# Patient Record
Sex: Male | Born: 1945 | Race: White | Hispanic: No | Marital: Married | Smoking: Never smoker
Health system: Southern US, Community
[De-identification: ages and names within clinical notes are randomized; demographics above are authoritative.]

## PROBLEM LIST (undated history)

## (undated) DIAGNOSIS — I10 Essential (primary) hypertension: Secondary | ICD-10-CM

## (undated) DIAGNOSIS — K219 Gastro-esophageal reflux disease without esophagitis: Secondary | ICD-10-CM

## (undated) DIAGNOSIS — T8859XA Other complications of anesthesia, initial encounter: Secondary | ICD-10-CM

## (undated) DIAGNOSIS — I4891 Unspecified atrial fibrillation: Secondary | ICD-10-CM

## (undated) DIAGNOSIS — I4819 Other persistent atrial fibrillation: Secondary | ICD-10-CM

## (undated) DIAGNOSIS — E785 Hyperlipidemia, unspecified: Secondary | ICD-10-CM

## (undated) DIAGNOSIS — I2510 Atherosclerotic heart disease of native coronary artery without angina pectoris: Secondary | ICD-10-CM

## (undated) DIAGNOSIS — Q278 Other specified congenital malformations of peripheral vascular system: Secondary | ICD-10-CM

## (undated) DIAGNOSIS — I7789 Other specified disorders of arteries and arterioles: Secondary | ICD-10-CM

## (undated) DIAGNOSIS — I219 Acute myocardial infarction, unspecified: Secondary | ICD-10-CM

## (undated) DIAGNOSIS — Q210 Ventricular septal defect: Secondary | ICD-10-CM

## (undated) DIAGNOSIS — T4145XA Adverse effect of unspecified anesthetic, initial encounter: Secondary | ICD-10-CM

## (undated) DIAGNOSIS — I483 Typical atrial flutter: Secondary | ICD-10-CM

## (undated) DIAGNOSIS — G4730 Sleep apnea, unspecified: Secondary | ICD-10-CM

## (undated) DIAGNOSIS — C439 Malignant melanoma of skin, unspecified: Secondary | ICD-10-CM

## (undated) DIAGNOSIS — Q399 Congenital malformation of esophagus, unspecified: Secondary | ICD-10-CM

## (undated) DIAGNOSIS — R7303 Prediabetes: Secondary | ICD-10-CM

## (undated) DIAGNOSIS — D649 Anemia, unspecified: Secondary | ICD-10-CM

## (undated) HISTORY — PX: OTHER SURGICAL HISTORY: SHX169

## (undated) HISTORY — DX: Atherosclerotic heart disease of native coronary artery without angina pectoris: I25.10

## (undated) HISTORY — DX: Ventricular septal defect: Q21.0

## (undated) HISTORY — PX: CARDIAC CATHETERIZATION: SHX172

## (undated) HISTORY — DX: Other persistent atrial fibrillation: I48.19

## (undated) HISTORY — DX: Other specified disorders of arteries and arterioles: I77.89

## (undated) HISTORY — DX: Typical atrial flutter: I48.3

## (undated) HISTORY — PX: SHOULDER SURGERY: SHX246

## (undated) HISTORY — DX: Malignant melanoma of skin, unspecified: C43.9

---
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
## 2012-12-28 DIAGNOSIS — L919 Hypertrophic disorder of the skin, unspecified: Secondary | ICD-10-CM | POA: Diagnosis not present

## 2012-12-28 DIAGNOSIS — L570 Actinic keratosis: Secondary | ICD-10-CM | POA: Diagnosis not present

## 2012-12-28 DIAGNOSIS — D239 Other benign neoplasm of skin, unspecified: Secondary | ICD-10-CM | POA: Diagnosis not present

## 2012-12-28 DIAGNOSIS — L280 Lichen simplex chronicus: Secondary | ICD-10-CM | POA: Diagnosis not present

## 2012-12-28 DIAGNOSIS — Z85820 Personal history of malignant melanoma of skin: Secondary | ICD-10-CM | POA: Diagnosis not present

## 2013-01-05 DIAGNOSIS — L719 Rosacea, unspecified: Secondary | ICD-10-CM | POA: Diagnosis not present

## 2013-01-05 DIAGNOSIS — H40019 Open angle with borderline findings, low risk, unspecified eye: Secondary | ICD-10-CM | POA: Diagnosis not present

## 2013-01-12 DIAGNOSIS — I10 Essential (primary) hypertension: Secondary | ICD-10-CM | POA: Diagnosis not present

## 2013-01-12 DIAGNOSIS — M79609 Pain in unspecified limb: Secondary | ICD-10-CM | POA: Diagnosis not present

## 2013-01-12 DIAGNOSIS — G4733 Obstructive sleep apnea (adult) (pediatric): Secondary | ICD-10-CM | POA: Diagnosis not present

## 2013-01-12 DIAGNOSIS — I2510 Atherosclerotic heart disease of native coronary artery without angina pectoris: Secondary | ICD-10-CM | POA: Diagnosis not present

## 2013-01-26 DIAGNOSIS — Z23 Encounter for immunization: Secondary | ICD-10-CM | POA: Diagnosis not present

## 2013-02-16 DIAGNOSIS — H01009 Unspecified blepharitis unspecified eye, unspecified eyelid: Secondary | ICD-10-CM | POA: Diagnosis not present

## 2013-02-16 DIAGNOSIS — H1045 Other chronic allergic conjunctivitis: Secondary | ICD-10-CM | POA: Diagnosis not present

## 2013-02-16 DIAGNOSIS — H40019 Open angle with borderline findings, low risk, unspecified eye: Secondary | ICD-10-CM | POA: Diagnosis not present

## 2013-03-12 DIAGNOSIS — M19019 Primary osteoarthritis, unspecified shoulder: Secondary | ICD-10-CM | POA: Diagnosis not present

## 2013-03-14 DIAGNOSIS — F988 Other specified behavioral and emotional disorders with onset usually occurring in childhood and adolescence: Secondary | ICD-10-CM | POA: Diagnosis not present

## 2013-04-05 DIAGNOSIS — M719 Bursopathy, unspecified: Secondary | ICD-10-CM | POA: Diagnosis not present

## 2013-04-05 DIAGNOSIS — M67919 Unspecified disorder of synovium and tendon, unspecified shoulder: Secondary | ICD-10-CM | POA: Diagnosis not present

## 2013-04-05 DIAGNOSIS — M19019 Primary osteoarthritis, unspecified shoulder: Secondary | ICD-10-CM | POA: Diagnosis not present

## 2013-04-05 DIAGNOSIS — M24119 Other articular cartilage disorders, unspecified shoulder: Secondary | ICD-10-CM | POA: Diagnosis not present

## 2013-04-13 DIAGNOSIS — M19019 Primary osteoarthritis, unspecified shoulder: Secondary | ICD-10-CM | POA: Diagnosis not present

## 2013-04-19 DIAGNOSIS — M19019 Primary osteoarthritis, unspecified shoulder: Secondary | ICD-10-CM | POA: Diagnosis not present

## 2013-04-21 DIAGNOSIS — M25519 Pain in unspecified shoulder: Secondary | ICD-10-CM | POA: Diagnosis not present

## 2013-04-26 DIAGNOSIS — M25519 Pain in unspecified shoulder: Secondary | ICD-10-CM | POA: Diagnosis not present

## 2013-04-28 DIAGNOSIS — M25519 Pain in unspecified shoulder: Secondary | ICD-10-CM | POA: Diagnosis not present

## 2013-05-03 DIAGNOSIS — M25519 Pain in unspecified shoulder: Secondary | ICD-10-CM | POA: Diagnosis not present

## 2013-05-05 DIAGNOSIS — M25519 Pain in unspecified shoulder: Secondary | ICD-10-CM | POA: Diagnosis not present

## 2013-05-10 DIAGNOSIS — M25519 Pain in unspecified shoulder: Secondary | ICD-10-CM | POA: Diagnosis not present

## 2013-05-12 DIAGNOSIS — M25519 Pain in unspecified shoulder: Secondary | ICD-10-CM | POA: Diagnosis not present

## 2013-05-17 DIAGNOSIS — M25519 Pain in unspecified shoulder: Secondary | ICD-10-CM | POA: Diagnosis not present

## 2013-05-18 DIAGNOSIS — H40019 Open angle with borderline findings, low risk, unspecified eye: Secondary | ICD-10-CM | POA: Diagnosis not present

## 2013-05-18 DIAGNOSIS — H04209 Unspecified epiphora, unspecified lacrimal gland: Secondary | ICD-10-CM | POA: Diagnosis not present

## 2013-05-18 DIAGNOSIS — H04129 Dry eye syndrome of unspecified lacrimal gland: Secondary | ICD-10-CM | POA: Diagnosis not present

## 2013-05-18 DIAGNOSIS — H2510 Age-related nuclear cataract, unspecified eye: Secondary | ICD-10-CM | POA: Diagnosis not present

## 2013-05-18 DIAGNOSIS — L719 Rosacea, unspecified: Secondary | ICD-10-CM | POA: Diagnosis not present

## 2013-05-18 DIAGNOSIS — H35019 Changes in retinal vascular appearance, unspecified eye: Secondary | ICD-10-CM | POA: Diagnosis not present

## 2013-05-19 DIAGNOSIS — M19019 Primary osteoarthritis, unspecified shoulder: Secondary | ICD-10-CM | POA: Diagnosis not present

## 2013-05-19 DIAGNOSIS — M25519 Pain in unspecified shoulder: Secondary | ICD-10-CM | POA: Diagnosis not present

## 2013-05-25 DIAGNOSIS — M19019 Primary osteoarthritis, unspecified shoulder: Secondary | ICD-10-CM | POA: Diagnosis not present

## 2013-05-25 DIAGNOSIS — M25519 Pain in unspecified shoulder: Secondary | ICD-10-CM | POA: Diagnosis not present

## 2013-06-01 DIAGNOSIS — M19019 Primary osteoarthritis, unspecified shoulder: Secondary | ICD-10-CM | POA: Diagnosis not present

## 2013-06-01 DIAGNOSIS — M25519 Pain in unspecified shoulder: Secondary | ICD-10-CM | POA: Diagnosis not present

## 2013-06-14 DIAGNOSIS — F988 Other specified behavioral and emotional disorders with onset usually occurring in childhood and adolescence: Secondary | ICD-10-CM | POA: Diagnosis not present

## 2013-06-14 DIAGNOSIS — M19019 Primary osteoarthritis, unspecified shoulder: Secondary | ICD-10-CM | POA: Diagnosis not present

## 2013-06-21 DIAGNOSIS — Z0000 Encounter for general adult medical examination without abnormal findings: Secondary | ICD-10-CM | POA: Diagnosis not present

## 2013-06-21 DIAGNOSIS — G4733 Obstructive sleep apnea (adult) (pediatric): Secondary | ICD-10-CM | POA: Diagnosis not present

## 2013-06-21 DIAGNOSIS — N529 Male erectile dysfunction, unspecified: Secondary | ICD-10-CM | POA: Diagnosis not present

## 2013-06-21 DIAGNOSIS — I10 Essential (primary) hypertension: Secondary | ICD-10-CM | POA: Diagnosis not present

## 2013-06-21 DIAGNOSIS — F988 Other specified behavioral and emotional disorders with onset usually occurring in childhood and adolescence: Secondary | ICD-10-CM | POA: Diagnosis not present

## 2013-06-21 DIAGNOSIS — Z86010 Personal history of colonic polyps: Secondary | ICD-10-CM | POA: Diagnosis not present

## 2013-06-21 DIAGNOSIS — I2510 Atherosclerotic heart disease of native coronary artery without angina pectoris: Secondary | ICD-10-CM | POA: Diagnosis not present

## 2013-07-12 DIAGNOSIS — F988 Other specified behavioral and emotional disorders with onset usually occurring in childhood and adolescence: Secondary | ICD-10-CM | POA: Diagnosis not present

## 2013-08-11 DIAGNOSIS — Z1211 Encounter for screening for malignant neoplasm of colon: Secondary | ICD-10-CM | POA: Diagnosis not present

## 2013-09-19 DIAGNOSIS — H40019 Open angle with borderline findings, low risk, unspecified eye: Secondary | ICD-10-CM | POA: Diagnosis not present

## 2013-09-19 DIAGNOSIS — H04129 Dry eye syndrome of unspecified lacrimal gland: Secondary | ICD-10-CM | POA: Diagnosis not present

## 2013-09-19 DIAGNOSIS — H01009 Unspecified blepharitis unspecified eye, unspecified eyelid: Secondary | ICD-10-CM | POA: Diagnosis not present

## 2013-09-19 DIAGNOSIS — H1045 Other chronic allergic conjunctivitis: Secondary | ICD-10-CM | POA: Diagnosis not present

## 2013-09-19 DIAGNOSIS — L719 Rosacea, unspecified: Secondary | ICD-10-CM | POA: Diagnosis not present

## 2013-10-11 DIAGNOSIS — F988 Other specified behavioral and emotional disorders with onset usually occurring in childhood and adolescence: Secondary | ICD-10-CM | POA: Diagnosis not present

## 2013-10-14 DIAGNOSIS — Z23 Encounter for immunization: Secondary | ICD-10-CM | POA: Diagnosis not present

## 2013-12-27 DIAGNOSIS — L570 Actinic keratosis: Secondary | ICD-10-CM | POA: Diagnosis not present

## 2013-12-27 DIAGNOSIS — Z85820 Personal history of malignant melanoma of skin: Secondary | ICD-10-CM | POA: Diagnosis not present

## 2013-12-27 DIAGNOSIS — D485 Neoplasm of uncertain behavior of skin: Secondary | ICD-10-CM | POA: Diagnosis not present

## 2013-12-27 DIAGNOSIS — D1801 Hemangioma of skin and subcutaneous tissue: Secondary | ICD-10-CM | POA: Diagnosis not present

## 2013-12-27 DIAGNOSIS — L821 Other seborrheic keratosis: Secondary | ICD-10-CM | POA: Diagnosis not present

## 2013-12-27 DIAGNOSIS — L259 Unspecified contact dermatitis, unspecified cause: Secondary | ICD-10-CM | POA: Diagnosis not present

## 2014-01-09 DIAGNOSIS — Z79899 Other long term (current) drug therapy: Secondary | ICD-10-CM | POA: Diagnosis not present

## 2014-01-09 DIAGNOSIS — F988 Other specified behavioral and emotional disorders with onset usually occurring in childhood and adolescence: Secondary | ICD-10-CM | POA: Diagnosis not present

## 2014-01-10 DIAGNOSIS — D485 Neoplasm of uncertain behavior of skin: Secondary | ICD-10-CM | POA: Diagnosis not present

## 2014-01-10 DIAGNOSIS — L98499 Non-pressure chronic ulcer of skin of other sites with unspecified severity: Secondary | ICD-10-CM | POA: Diagnosis not present

## 2014-01-10 DIAGNOSIS — Z85820 Personal history of malignant melanoma of skin: Secondary | ICD-10-CM | POA: Diagnosis not present

## 2014-02-24 DIAGNOSIS — H903 Sensorineural hearing loss, bilateral: Secondary | ICD-10-CM | POA: Diagnosis not present

## 2014-04-06 DIAGNOSIS — F988 Other specified behavioral and emotional disorders with onset usually occurring in childhood and adolescence: Secondary | ICD-10-CM | POA: Diagnosis not present

## 2014-06-29 DIAGNOSIS — G4733 Obstructive sleep apnea (adult) (pediatric): Secondary | ICD-10-CM | POA: Diagnosis not present

## 2014-06-29 DIAGNOSIS — F988 Other specified behavioral and emotional disorders with onset usually occurring in childhood and adolescence: Secondary | ICD-10-CM | POA: Diagnosis not present

## 2014-06-29 DIAGNOSIS — I2510 Atherosclerotic heart disease of native coronary artery without angina pectoris: Secondary | ICD-10-CM | POA: Diagnosis not present

## 2014-06-29 DIAGNOSIS — E7800 Pure hypercholesterolemia, unspecified: Secondary | ICD-10-CM | POA: Diagnosis not present

## 2014-06-29 DIAGNOSIS — Z86010 Personal history of colonic polyps: Secondary | ICD-10-CM | POA: Diagnosis not present

## 2014-06-29 DIAGNOSIS — Z0000 Encounter for general adult medical examination without abnormal findings: Secondary | ICD-10-CM | POA: Diagnosis not present

## 2014-06-29 DIAGNOSIS — I10 Essential (primary) hypertension: Secondary | ICD-10-CM | POA: Diagnosis not present

## 2014-06-29 DIAGNOSIS — Z23 Encounter for immunization: Secondary | ICD-10-CM | POA: Diagnosis not present

## 2014-06-29 DIAGNOSIS — K219 Gastro-esophageal reflux disease without esophagitis: Secondary | ICD-10-CM | POA: Diagnosis not present

## 2014-07-04 DIAGNOSIS — H2510 Age-related nuclear cataract, unspecified eye: Secondary | ICD-10-CM | POA: Diagnosis not present

## 2014-07-04 DIAGNOSIS — H40019 Open angle with borderline findings, low risk, unspecified eye: Secondary | ICD-10-CM | POA: Diagnosis not present

## 2014-07-04 DIAGNOSIS — H10409 Unspecified chronic conjunctivitis, unspecified eye: Secondary | ICD-10-CM | POA: Diagnosis not present

## 2014-07-06 DIAGNOSIS — F988 Other specified behavioral and emotional disorders with onset usually occurring in childhood and adolescence: Secondary | ICD-10-CM | POA: Diagnosis not present

## 2014-08-12 ENCOUNTER — Inpatient Hospital Stay (HOSPITAL_COMMUNITY)
Admission: EM | Admit: 2014-08-12 | Discharge: 2014-08-15 | DRG: 247 | Disposition: A | Discharge status: Home / Self Care | Payer: Medicare Other | Attending: Cardiovascular Disease | Admitting: Cardiovascular Disease

## 2014-08-12 ENCOUNTER — Emergency Department (HOSPITAL_COMMUNITY): Payer: Medicare Other

## 2014-08-12 ENCOUNTER — Encounter (HOSPITAL_COMMUNITY): Payer: Self-pay | Admitting: Emergency Medicine

## 2014-08-12 DIAGNOSIS — I214 Non-ST elevation (NSTEMI) myocardial infarction: Principal | ICD-10-CM | POA: Diagnosis present

## 2014-08-12 DIAGNOSIS — R079 Chest pain, unspecified: Secondary | ICD-10-CM

## 2014-08-12 DIAGNOSIS — I712 Thoracic aortic aneurysm, without rupture: Secondary | ICD-10-CM | POA: Diagnosis present

## 2014-08-12 DIAGNOSIS — Q210 Ventricular septal defect: Secondary | ICD-10-CM

## 2014-08-12 DIAGNOSIS — I2510 Atherosclerotic heart disease of native coronary artery without angina pectoris: Secondary | ICD-10-CM | POA: Diagnosis present

## 2014-08-12 DIAGNOSIS — R0789 Other chest pain: Secondary | ICD-10-CM | POA: Diagnosis not present

## 2014-08-12 DIAGNOSIS — I25110 Atherosclerotic heart disease of native coronary artery with unstable angina pectoris: Secondary | ICD-10-CM | POA: Diagnosis not present

## 2014-08-12 DIAGNOSIS — I480 Paroxysmal atrial fibrillation: Secondary | ICD-10-CM | POA: Diagnosis not present

## 2014-08-12 DIAGNOSIS — E785 Hyperlipidemia, unspecified: Secondary | ICD-10-CM | POA: Diagnosis present

## 2014-08-12 DIAGNOSIS — R61 Generalized hyperhidrosis: Secondary | ICD-10-CM | POA: Diagnosis not present

## 2014-08-12 DIAGNOSIS — I10 Essential (primary) hypertension: Secondary | ICD-10-CM | POA: Diagnosis present

## 2014-08-12 DIAGNOSIS — R748 Abnormal levels of other serum enzymes: Secondary | ICD-10-CM | POA: Diagnosis not present

## 2014-08-12 DIAGNOSIS — I517 Cardiomegaly: Secondary | ICD-10-CM | POA: Diagnosis not present

## 2014-08-12 DIAGNOSIS — R778 Other specified abnormalities of plasma proteins: Secondary | ICD-10-CM

## 2014-08-12 DIAGNOSIS — Z955 Presence of coronary angioplasty implant and graft: Secondary | ICD-10-CM

## 2014-08-12 DIAGNOSIS — I4891 Unspecified atrial fibrillation: Secondary | ICD-10-CM | POA: Diagnosis not present

## 2014-08-12 DIAGNOSIS — R7989 Other specified abnormal findings of blood chemistry: Secondary | ICD-10-CM

## 2014-08-12 HISTORY — DX: Hyperlipidemia, unspecified: E78.5

## 2014-08-12 HISTORY — DX: Gastro-esophageal reflux disease without esophagitis: K21.9

## 2014-08-12 HISTORY — DX: Essential (primary) hypertension: I10

## 2014-08-12 LAB — BASIC METABOLIC PANEL
Anion gap: 12 (ref 5–15)
BUN: 25 mg/dL — AB (ref 6–23)
CO2: 26 mEq/L (ref 19–32)
Calcium: 9.3 mg/dL (ref 8.4–10.5)
Chloride: 103 mEq/L (ref 96–112)
Creatinine, Ser: 1.06 mg/dL (ref 0.50–1.35)
GFR calc Af Amer: 83 mL/min — ABNORMAL LOW (ref >90)
GFR, EST NON AFRICAN AMERICAN: 71 mL/min — AB (ref >90)
GLUCOSE: 111 mg/dL — AB (ref 70–99)
Potassium: 4.7 mEq/L (ref 3.7–5.3)
Sodium: 141 mEq/L (ref 137–147)

## 2014-08-12 LAB — I-STAT TROPONIN, ED: Troponin i, poc: 0.09 ng/mL (ref 0.00–0.08)

## 2014-08-12 LAB — PROTIME-INR
INR: 0.97 (ref 0.00–1.49)
PROTHROMBIN TIME: 12.9 s (ref 11.6–15.2)

## 2014-08-12 LAB — CBC
HEMATOCRIT: 46.3 % (ref 39.0–52.0)
HEMOGLOBIN: 15.9 g/dL (ref 13.0–17.0)
MCH: 29.2 pg (ref 26.0–34.0)
MCHC: 34.3 g/dL (ref 30.0–36.0)
MCV: 85 fL (ref 78.0–100.0)
Platelets: 219 10*3/uL (ref 150–400)
RBC: 5.45 MIL/uL (ref 4.22–5.81)
RDW: 14.6 % (ref 11.5–15.5)
WBC: 9.8 10*3/uL (ref 4.0–10.5)

## 2014-08-12 LAB — PRO B NATRIURETIC PEPTIDE: Pro B Natriuretic peptide (BNP): 193.9 pg/mL — ABNORMAL HIGH (ref 0–125)

## 2014-08-12 LAB — TROPONIN I
TROPONIN I: 0.35 ng/mL — AB (ref <0.30)
Troponin I: 0.31 ng/mL (ref <0.30)

## 2014-08-12 LAB — TSH: TSH: 4.57 u[IU]/mL — AB (ref 0.350–4.500)

## 2014-08-12 MED ORDER — PANTOPRAZOLE SODIUM 40 MG PO TBEC
40.0000 mg | DELAYED_RELEASE_TABLET | Freq: Every day | ORAL | Status: DC
Start: 2014-08-12 — End: 2014-08-15
  Administered 2014-08-13 – 2014-08-15 (×4): 40 mg via ORAL
  Filled 2014-08-12 (×5): qty 1

## 2014-08-12 MED ORDER — FOLIC ACID 1 MG PO TABS
800.0000 ug | ORAL_TABLET | Freq: Two times a day (BID) | ORAL | Status: DC
  Administered 2014-08-13 – 2014-08-14 (×5): 1 mg via ORAL
  Filled 2014-08-12 (×7): qty 1

## 2014-08-12 MED ORDER — ASPIRIN 81 MG PO CHEW
324.0000 mg | CHEWABLE_TABLET | Freq: Once | ORAL | Status: AC
  Administered 2014-08-12: 324 mg via ORAL
  Filled 2014-08-12: qty 4

## 2014-08-12 MED ORDER — OLOPATADINE HCL 0.1 % OP SOLN
1.0000 [drp] | Freq: Two times a day (BID) | OPHTHALMIC | Status: DC
  Administered 2014-08-13 – 2014-08-15 (×5): 1 [drp] via OPHTHALMIC
  Filled 2014-08-12 (×2): qty 5

## 2014-08-12 MED ORDER — AMPHETAMINE-DEXTROAMPHETAMINE 10 MG PO TABS
30.0000 mg | ORAL_TABLET | ORAL | Status: DC
  Administered 2014-08-14: 30 mg via ORAL
  Filled 2014-08-12 (×2): qty 3

## 2014-08-12 MED ORDER — DILTIAZEM HCL 25 MG/5ML IV SOLN
10.0000 mg | Freq: Once | INTRAVENOUS | Status: AC
  Administered 2014-08-12: 10 mg via INTRAVENOUS
  Filled 2014-08-12: qty 5

## 2014-08-12 MED ORDER — HEPARIN BOLUS VIA INFUSION
4000.0000 [IU] | Freq: Once | INTRAVENOUS | Status: AC
Start: 2014-08-12 — End: 2014-08-12
  Administered 2014-08-12: 4000 [IU] via INTRAVENOUS
  Filled 2014-08-12: qty 4000

## 2014-08-12 MED ORDER — ATORVASTATIN CALCIUM 40 MG PO TABS
40.0000 mg | ORAL_TABLET | Freq: Every day | ORAL | Status: DC
  Administered 2014-08-13 – 2014-08-14 (×3): 40 mg via ORAL
  Filled 2014-08-12 (×4): qty 1

## 2014-08-12 MED ORDER — METOPROLOL TARTRATE 50 MG PO TABS
50.0000 mg | ORAL_TABLET | Freq: Two times a day (BID) | ORAL | Status: DC
  Administered 2014-08-12: 50 mg via ORAL
  Filled 2014-08-12 (×2): qty 1
  Filled 2014-08-12: qty 2

## 2014-08-12 MED ORDER — METOPROLOL TARTRATE 1 MG/ML IV SOLN
5.0000 mg | Freq: Once | INTRAVENOUS | Status: AC
  Administered 2014-08-12: 5 mg via INTRAVENOUS
  Filled 2014-08-12: qty 5

## 2014-08-12 MED ORDER — PAROXETINE HCL 10 MG PO TABS
10.0000 mg | ORAL_TABLET | ORAL | Status: DC
  Administered 2014-08-14: 11:00:00 10 mg via ORAL
  Filled 2014-08-12: qty 1

## 2014-08-12 MED ORDER — DILTIAZEM HCL 100 MG IV SOLR
5.0000 mg/h | INTRAVENOUS | Status: DC
  Administered 2014-08-12: 5 mg/h via INTRAVENOUS

## 2014-08-12 MED ORDER — HEPARIN (PORCINE) IN NACL 100-0.45 UNIT/ML-% IJ SOLN
1400.0000 [IU]/h | INTRAMUSCULAR | Status: DC
  Administered 2014-08-12: 1500 [IU]/h via INTRAVENOUS
  Administered 2014-08-13 – 2014-08-14 (×2): 1400 [IU]/h via INTRAVENOUS
  Filled 2014-08-12 (×6): qty 250

## 2014-08-12 MED ORDER — COLCHICINE 0.6 MG PO TABS
0.6000 mg | ORAL_TABLET | Freq: Every day | ORAL | Status: DC
  Administered 2014-08-13 – 2014-08-14 (×3): 0.6 mg via ORAL
  Filled 2014-08-12 (×5): qty 1

## 2014-08-12 MED ORDER — LOSARTAN POTASSIUM 50 MG PO TABS
100.0000 mg | ORAL_TABLET | Freq: Every day | ORAL | Status: DC
  Administered 2014-08-13 – 2014-08-15 (×3): 100 mg via ORAL
  Filled 2014-08-12 (×4): qty 2

## 2014-08-12 NOTE — ED Notes (Signed)
Lab called with panic Troponin level:  0.31.  Reported to PA.

## 2014-08-12 NOTE — ED Notes (Signed)
Heparin bolus and drip started..  cardizem increrased to 46ml

## 2014-08-12 NOTE — ED Notes (Signed)
Dr Darl Householder given a copy of troponin results .09

## 2014-08-12 NOTE — ED Notes (Signed)
The pt is getting diltizem iv

## 2014-08-12 NOTE — ED Notes (Signed)
Waiting for heparin to arrive from pharmacy

## 2014-08-12 NOTE — ED Notes (Signed)
The pts heart rate appears regular at present.  Rate 127.   Pt tolerating

## 2014-08-12 NOTE — ED Notes (Signed)
cardizem rate increased

## 2014-08-12 NOTE — ED Notes (Signed)
Lopressor given iv per dr Doylene Canard at the bedside

## 2014-08-12 NOTE — ED Notes (Signed)
No pain

## 2014-08-12 NOTE — ED Notes (Signed)
The pt s heart rate caled to dr Doylene Canard.  Orders for oral med if he continues to tolerate this rhy.    Sinus bradycardia at present

## 2014-08-12 NOTE — ED Notes (Signed)
Ep;isodes of rapid heart beat.  At present pulse is irregular may be af.   No pain at present.  Skin cool and clammy.  Nasal 02 on arrival to room.  No pain

## 2014-08-12 NOTE — ED Provider Notes (Signed)
CSN: [id number]     Arrival date & time 08/12/14  1546 History   First MD Initiated Contact with Patient 08/12/14 1620     Chief Complaint  Patient presents with  . Chest Pain     (Consider location/radiation/quality/duration/timing/severity/associated sxs/prior Treatment) The history is provided by the patient and medical records.   This is a 68 y.o. M with PMH significant for HTN, HLP, CAD, presenting to the ED for chest pain and diaphoresis.  Patient states over the past week he has been having episodes of chest discomfort, SOB, and heart palpitations.  States some intermittent lightheadedness, none currently.  No dizziness, weakness, confusion.  States he took some maalox this morning which usually helps his symptoms, only minimal relief this time.  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 Daneen Schick.  States he is not currently followed by cardiology.  Patient is not currently a smoker.  Past Medical History  Diagnosis Date  . Hypertension   . Hyperlipidemia    History reviewed. No pertinent past surgical history. History reviewed. No pertinent family history. History  Substance Use Topics  . Smoking status: Never Smoker   . Smokeless tobacco: Not on file  . Alcohol Use: No    Review of Systems  Constitutional: Positive for diaphoresis.  Cardiovascular: Positive for chest pain and palpitations.  All other systems reviewed and are negative.     Allergies  Neosporin and Vicodin  Home Medications   Prior to Admission medications   Medication Sig Start Date End Date Taking? Authorizing Provider  amphetamine-dextroamphetamine (ADDERALL) 30 MG tablet Take 15-30 mg by mouth 3 (three) times daily. Take 30mg  in the morning. Take 15mg  in the afternoon and evening 08/07/14  Yes Historical Provider, MD  aspirin 81 MG tablet Take 81 mg by mouth daily.   Yes Historical  Provider, MD  atorvastatin (LIPITOR) 40 MG tablet  06/22/14  Yes Historical Provider, MD  b complex vitamins capsule Take 1 capsule by mouth daily.   Yes Historical Provider, MD  cholecalciferol (VITAMIN D) 1000 UNITS tablet Take 2,000 Units by mouth daily.   Yes Historical Provider, MD  COLCRYS 0.6 MG tablet  05/26/14  Yes Historical Provider, MD  folic acid (FOLVITE) 196 MCG tablet Take 800 mcg by mouth 2 (two) times daily.   Yes Historical Provider, MD  losartan (COZAAR) 100 MG tablet  05/26/14  Yes Historical Provider, MD  omega-3 acid ethyl esters (LOVAZA) 1 G capsule Take 1 g by mouth daily.   Yes Historical Provider, MD  omeprazole (PRILOSEC) 20 MG capsule Take 20 mg by mouth daily.   Yes Historical Provider, MD  PARoxetine (PAXIL) 20 MG tablet Take 10 mg by mouth every other day.  05/26/14  Yes Historical Provider, MD  PATADAY 0.2 % SOLN Place 1 drop into both eyes every evening.  08/10/14  Yes Historical Provider, MD   BP 143/77  Pulse 150  Temp(Src) 98.5 F (36.9 C) (Oral)  Resp 20  SpO2 99%  Physical Exam  Nursing note and vitals reviewed. Constitutional: He is oriented to person, place, and time. He appears well-developed and well-nourished. No distress.  HENT:  Head: Normocephalic and atraumatic.  Mouth/Throat: Oropharynx is clear and moist.  Eyes: Conjunctivae and EOM are normal. Pupils are equal, round, and reactive to light.  Neck: Normal range of motion. Neck supple.  Cardiovascular: Intact distal pulses and  normal pulses.  An irregularly irregular rhythm present. Tachycardia present.   Murmur heard. AFIB, murmur noted  Pulmonary/Chest: Effort normal and breath sounds normal. No respiratory distress. He has no wheezes.  Abdominal: Soft. Bowel sounds are normal. There is no tenderness. There is no guarding.  Musculoskeletal: Normal range of motion. He exhibits no edema.  Neurological: He is alert and oriented to person, place, and time.  Skin: Skin is warm and dry. He is  not diaphoretic.  Psychiatric: He has a normal mood and affect.    ED Course  Procedures (including critical care time)  CRITICAL CARE Performed by: Larene Pickett   Total critical care time: 45  Critical care time was exclusive of separately billable procedures and treating other patients.  Critical care was necessary to treat or prevent imminent or life-threatening deterioration.  Critical care was time spent personally by me on the following activities: development of treatment plan with patient and/or surrogate as well as nursing, discussions with consultants, evaluation of patient's response to treatment, examination of patient, obtaining history from patient or surrogate, ordering and performing treatments and interventions, ordering and review of laboratory studies, ordering and review of radiographic studies, pulse oximetry and re-evaluation of patient's condition.  Labs Review Labs Reviewed  BASIC METABOLIC PANEL - Abnormal; Notable for the following:    Glucose, Bld 111 (*)    BUN 25 (*)    GFR calc non Af Amer 71 (*)    GFR calc Af Amer 83 (*)    All other components within normal limits  PRO B NATRIURETIC PEPTIDE - Abnormal; Notable for the following:    Pro B Natriuretic peptide (BNP) 193.9 (*)    All other components within normal limits  TROPONIN I - Abnormal; Notable for the following:    Troponin I 0.31 (*)    All other components within normal limits  TSH - Abnormal; Notable for the following:    TSH 4.570 (*)    All other components within normal limits  TROPONIN I - Abnormal; Notable for the following:    Troponin I 0.35 (*)    All other components within normal limits  I-STAT TROPOININ, ED - Abnormal; Notable for the following:    Troponin i, poc 0.09 (*)    All other components within normal limits  CBC  PROTIME-INR  HEPARIN LEVEL (UNFRACTIONATED)  HEPARIN LEVEL (UNFRACTIONATED)  CBC  TROPONIN I  TROPONIN I    Imaging Review Dg Chest Portable  1 View  08/12/2014   CLINICAL DATA:  Chest pain  EXAM: PORTABLE CHEST - 1 VIEW  COMPARISON:  None.  FINDINGS: The mediastinal contour is normal. The heart size is enlarged. There is no focal infiltrate, pulmonary edema, or pleural effusion. Vessel on end is identified in bilateral perihilar region. The visualized skeletal structures are unremarkable.  IMPRESSION: No active cardiopulmonary disease.   Electronically Signed   By: Abelardo Diesel M.D.   On: 08/12/2014 17:53     EKG Interpretation   Date/Time:  Saturday August 12 2014 15:53:44 EDT Ventricular Rate:  150 PR Interval:    QRS Duration: 116 QT Interval:  284 QTC Calculation: 448 R Axis:   -31 Text Interpretation:  Atrial fibrillation with rapid ventricular response  Left axis deviation Left ventricular hypertrophy with QRS widening  Nonspecific ST abnormality Abnormal ECG No previous ECGs available  Confirmed by YAO  MD, DAVID ([id number]) on 08/12/2014 4:35:12 PM      MDM   Final diagnoses:  New  onset a-fib  Chest pain, unspecified chest pain type  Elevated troponin   68 year old male with chest discomfort and palpitations over the past week. On arrival, the patient had new onset A. fib with RVR, rate initially 150s. Patient given Cardizem bolus and started on drip with improvement of rate to 120's.  Lab work with elevated troponin.  Patient given ASA.  CXR clear.  Patient does have prior cardiac hx, high risk for ACS.  Given this and elevated troponin, will start on heparin.  Case discussed with on call cardiology, Dr. Doylene Canard, who has evaluated patient in the ED and will admit for further management.  Larene Pickett, PA-C 08/12/14 2152

## 2014-08-12 NOTE — ED Notes (Signed)
rhy sinus Weidemann occasional  pvcs

## 2014-08-12 NOTE — ED Notes (Signed)
The pts wife has  [place] home.  .  The pt has remained in sinus bradycardia.  cardizem has been discontinued.  Dr Doylene Canard noitified and the pt will be placed in a regular tele bed now.

## 2014-08-12 NOTE — ED Notes (Signed)
Aspirin given

## 2014-08-12 NOTE — ED Notes (Signed)
The pt is in nsr.  cardizem   Rate lowered to 63ml/hr

## 2014-08-12 NOTE — H&P (Signed)
Referring Physician:  Kari Kerth is an 68 y.o. male.                       Chief Complaint: Chest pain and palpitation  HPI: 68 y.o. M with PMH significant for HTN, HLP, CAD, presenting to the ED for chest pain and diaphoresis. Patient states over the past week he has been having episodes of chest discomfort, SOB, and heart palpitations. States some intermittent lightheadedness also. EKG showed atrial fibrillation with rapid ventricular response and LVH with strain.    Past Medical History  Diagnosis Date  . Hypertension   . Hyperlipidemia       History reviewed. No pertinent past surgical history.  History reviewed. No pertinent family history. Social History:  reports that he has never smoked. He does not have any smokeless tobacco history on file. He reports that he does not drink alcohol or use illicit drugs.  Allergies:  Allergies  Allergen Reactions  . Neosporin [Neomycin-Bacitracin Zn-Polymyx]   . Vicodin [Hydrocodone-Acetaminophen]      (Not in a hospital admission)  Results for orders placed during the hospital encounter of 08/12/14 (from the past 48 hour(s))  CBC     Status: None   Collection Time    08/12/14  4:14 PM      Result Value Ref Range   WBC 9.8  4.0 - 10.5 K/uL   RBC 5.45  4.22 - 5.81 MIL/uL   Hemoglobin 15.9  13.0 - 17.0 g/dL   HCT 46.3  39.0 - 52.0 %   MCV 85.0  78.0 - 100.0 fL   MCH 29.2  26.0 - 34.0 pg   MCHC 34.3  30.0 - 36.0 g/dL   RDW 14.6  11.5 - 15.5 %   Platelets 219  150 - 400 K/uL  BASIC METABOLIC PANEL     Status: Abnormal   Collection Time    08/12/14  4:14 PM      Result Value Ref Range   Sodium 141  137 - 147 mEq/L   Potassium 4.7  3.7 - 5.3 mEq/L   Chloride 103  96 - 112 mEq/L   CO2 26  19 - 32 mEq/L   Glucose, Bld 111 (*) 70 - 99 mg/dL   BUN 25 (*) 6 - 23 mg/dL   Creatinine, Ser 1.06  0.50 - 1.35 mg/dL   Calcium 9.3  8.4 - 10.5 mg/dL   GFR calc non Af Amer 71 (*) >90 mL/min   GFR calc Af Amer 83 (*) >90 mL/min   Comment: (NOTE)     The eGFR has been calculated using the CKD EPI equation.     This calculation has not been validated in all clinical situations.     eGFR's persistently <90 mL/min signify possible Chronic Kidney     Disease.   Anion gap 12  5 - 15  PRO B NATRIURETIC PEPTIDE     Status: Abnormal   Collection Time    08/12/14  4:14 PM      Result Value Ref Range   Pro B Natriuretic peptide (BNP) 193.9 (*) 0 - 125 pg/mL  I-STAT TROPOININ, ED     Status: Abnormal   Collection Time    08/12/14  4:32 PM      Result Value Ref Range   Troponin i, poc 0.09 (*) 0.00 - 0.08 ng/mL   Comment NOTIFIED PHYSICIAN     Comment 3  Comment: Due to the release kinetics of cTnI,     a negative result within the first hours     of the onset of symptoms does not rule out     myocardial infarction with certainty.     If myocardial infarction is still suspected,     repeat the test at appropriate intervals.  PROTIME-INR     Status: None   Collection Time    08/12/14  4:37 PM      Result Value Ref Range   Prothrombin Time 12.9  11.6 - 15.2 seconds   INR 0.97  0.00 - 1.49  TROPONIN I     Status: Abnormal   Collection Time    08/12/14  4:37 PM      Result Value Ref Range   Troponin I 0.31 (*) <0.30 ng/mL   Comment:            Due to the release kinetics of cTnI,     a negative result within the first hours     of the onset of symptoms does not rule out     myocardial infarction with certainty.     If myocardial infarction is still suspected,     repeat the test at appropriate intervals.     CRITICAL RESULT CALLED TO, READ BACK BY AND VERIFIED WITH:     CARFLEISS,S RN 08/12/14 [place]   Dg Chest Portable 1 View  08/12/2014   CLINICAL DATA:  Chest pain  EXAM: PORTABLE CHEST - 1 VIEW  COMPARISON:  None.  FINDINGS: The mediastinal contour is normal. The heart size is enlarged. There is no focal infiltrate, pulmonary edema, or pleural effusion. Vessel on end is identified in  bilateral perihilar region. The visualized skeletal structures are unremarkable.  IMPRESSION: No active cardiopulmonary disease.   Electronically Signed   By: Abelardo Diesel M.D.   On: 08/12/2014 17:53    Review Of Systems Constitutional: Positive for diaphoresis.  Cardiovascular: Positive for chest pain and palpitations.  All other systems reviewed and are negative.  Blood pressure 131/77, pulse 127, temperature 98.5 F (36.9 C), temperature source Oral, resp. rate 18, weight 107.956 kg (238 lb), SpO2 97.00%. Physical Exam  Nursing note and vitals reviewed.  Constitutional: He appears well-developed and well-nourished. No distress.  HENT: Normocephalic and atraumatic. Oropharynx is clear and moist.  Eyes: Conjunctivae and EOM are normal. Pupils are equal, round, and reactive to light.  Neck: Normal range of motion. Neck supple.  Cardiovascular: An irregularly irregular rhythm present. Tachycardia present. III/VI systolic murmur. Pulmonary/Chest: Effort normal and breath sounds normal.   Abdominal: Soft. Bowel sounds are normal. There is no tenderness. There is no guarding.  Musculoskeletal: Normal range of motion. He exhibits no edema.  Neurological: He is alert and oriented to person, place, and time. Moves all 4 extremities Skin: Skin is warm and dry. He is not diaphoretic.  Psychiatric: He has a normal mood and affect.   Assessment/Plan Chest pain r/o MI Atrial fibrillation with rapid ventricular response Hypertension Hyperlipidemia  Admit/Check Troponin-I/IV heparin/Echocardiogram  Birdie Riddle, MD  08/12/2014, 7:35 PM

## 2014-08-12 NOTE — ED Notes (Signed)
pts cardizem discontinued

## 2014-08-12 NOTE — ED Notes (Signed)
Dr Doylene Canard in to see the p.  cardizem  Decreased to 5 ml.  bp lower

## 2014-08-12 NOTE — ED Notes (Signed)
No pain waiting on cards

## 2014-08-12 NOTE — Progress Notes (Signed)
ANTICOAGULATION CONSULT NOTE - Initial Consult  Pharmacy Consult for Heparin Indication: atrial fibrillation  Allergies  Allergen Reactions  . Neosporin [Neomycin-Bacitracin Zn-Polymyx]   . Vicodin [Hydrocodone-Acetaminophen]     Patient Measurements:   Heparin Dosing Weight:    Vital Signs: Temp: 98.5 F (36.9 C) (10/10 1600) Temp Source: Oral (10/10 1600) BP: 145/95 mmHg (10/10 1633) Pulse Rate: 137 (10/10 1633)  Labs: No results found for this basename: HGB, HCT, PLT, APTT, LABPROT, INR, HEPARINUNFRC, CREATININE, CKTOTAL, CKMB, TROPONINI,  in the last 72 hours  CrCl is unknown because no creatinine reading has been taken and the patient has no height on file.   Medical History: Past Medical History  Diagnosis Date  . Hypertension   . Hyperlipidemia     Medications: see med rec  Assessment: CP, anxiety, SOB. 68 y/o M presents to ED with CP and diaphoresis.  PMH: HTN, HLD, CAD.  Anticoagulation: Afib. Baseline CBC WNL.   Cards: HTN, HLD, CAD. First troponin 0.09.  Goal of Therapy:  Heparin level 0.3-0.7 units/ml Monitor platelets by anticoagulation protocol: Yes   Plan:  Heparin 4000 unit bolus Heparin 1500 units/hr Heparin level in 8 hrs and daily.  Maddelyn Rocca S. Alford Highland, PharmD, BCPS Clinical Staff Pharmacist Pager [phone]  Eilene Ghazi Stillinger 08/12/2014,4:48 PM

## 2014-08-12 NOTE — ED Notes (Signed)
Wife Daray Polgar cell # [phone].  Call if needed.  Will return in am.

## 2014-08-12 NOTE — ED Provider Notes (Signed)
Medical screening examination/treatment/procedure(s) were conducted as a shared visit with non-physician practitioner(s) and myself.  I personally evaluated the patient during the encounter.   EKG Interpretation   Date/Time:  Saturday August 12 2014 15:53:44 EDT Ventricular Rate:  150 PR Interval:    QRS Duration: 116 QT Interval:  284 QTC Calculation: 448 R Axis:   -31 Text Interpretation:  Atrial fibrillation with rapid ventricular response  Left axis deviation Left ventricular hypertrophy with QRS widening  Nonspecific ST abnormality Abnormal ECG No previous ECGs available  Confirmed by YAO  MD, DAVID ([id number]) on 08/12/2014 4:35:12 PM      Jose Stone is a 68 y.o. male hx of HTN, CAD here with chest pain, diaphoresis, palpitations. Nauseated for the last week that is intermittent. Took maalox today but not improved. Also started palpitations today. On exam, irregular heart beat around 150s. Also VSD heart murmur (chronic). Lungs clear. EKG showed rapid afib. Given cardizem and started on cardizem drip. Also positive troponin, given heparin, ASA. Will admit to stepdown.   CRITICAL CARE Performed by: Darl Householder, DAVID   Total critical care time: 30 min   Critical care time was exclusive of separately billable procedures and treating other patients.  Critical care was necessary to treat or prevent imminent or life-threatening deterioration.  Critical care was time spent personally by me on the following activities: development of treatment plan with patient and/or surrogate as well as nursing, discussions with consultants, evaluation of patient's response to treatment, examination of patient, obtaining history from patient or surrogate, ordering and performing treatments and interventions, ordering and review of laboratory studies, ordering and review of radiographic studies, pulse oximetry and re-evaluation of patient's condition.    Wandra Arthurs, MD 08/12/14 [phone]

## 2014-08-12 NOTE — ED Notes (Signed)
Pt reports for the past couple of days that he has had some chest pain, anxiety, and diaphoresis. States that he took some Maloxx this morning and it helped a little. Reports cardiac hx but no active treatment at this time. Wife reports his pulse was elevated this morning and hypertensive.

## 2014-08-12 NOTE — ED Notes (Signed)
Unable to get to the pt.  Pharmacy med tech lab reg at bedside

## 2014-08-12 NOTE — ED Notes (Signed)
Port chest   shot

## 2014-08-12 NOTE — ED Notes (Signed)
i discussed the pts med list with the pharmacist jonathan and  He suggested giving only the lipitor tonight.  He will send

## 2014-08-12 NOTE — ED Notes (Signed)
The pt   Has been moved from pod e to pod a.   Sinus bradycardia at 50.

## 2014-08-12 NOTE — ED Notes (Signed)
Dr Doylene Canard at the bedside

## 2014-08-13 ENCOUNTER — Encounter (HOSPITAL_COMMUNITY): Payer: Self-pay | Admitting: *Deleted

## 2014-08-13 DIAGNOSIS — I517 Cardiomegaly: Secondary | ICD-10-CM

## 2014-08-13 LAB — TROPONIN I
TROPONIN I: 0.36 ng/mL — AB (ref <0.30)
TROPONIN I: 0.34 ng/mL — AB (ref <0.30)

## 2014-08-13 LAB — BASIC METABOLIC PANEL
ANION GAP: 15 (ref 5–15)
BUN: 25 mg/dL — ABNORMAL HIGH (ref 6–23)
CO2: 22 mEq/L (ref 19–32)
CREATININE: 1.08 mg/dL (ref 0.50–1.35)
Calcium: 8.5 mg/dL (ref 8.4–10.5)
Chloride: 102 mEq/L (ref 96–112)
GFR calc Af Amer: 81 mL/min — ABNORMAL LOW (ref >90)
GFR calc non Af Amer: 70 mL/min — ABNORMAL LOW (ref >90)
Glucose, Bld: 134 mg/dL — ABNORMAL HIGH (ref 70–99)
POTASSIUM: 4.3 meq/L (ref 3.7–5.3)
Sodium: 139 mEq/L (ref 137–147)

## 2014-08-13 LAB — CBC
HEMATOCRIT: 41.7 % (ref 39.0–52.0)
Hemoglobin: 14 g/dL (ref 13.0–17.0)
MCH: 28.7 pg (ref 26.0–34.0)
MCHC: 33.6 g/dL (ref 30.0–36.0)
MCV: 85.5 fL (ref 78.0–100.0)
Platelets: 185 10*3/uL (ref 150–400)
RBC: 4.88 MIL/uL (ref 4.22–5.81)
RDW: 14.9 % (ref 11.5–15.5)
WBC: 9.3 10*3/uL (ref 4.0–10.5)

## 2014-08-13 LAB — HEPARIN LEVEL (UNFRACTIONATED)
HEPARIN UNFRACTIONATED: 0.48 [IU]/mL (ref 0.30–0.70)
Heparin Unfractionated: 0.8 IU/mL — ABNORMAL HIGH (ref 0.30–0.70)
Heparin Unfractionated: 0.42 IU/mL (ref 0.30–0.70)

## 2014-08-13 LAB — PROTIME-INR
INR: 1.08 (ref 0.00–1.49)
Prothrombin Time: 14 seconds (ref 11.6–15.2)

## 2014-08-13 MED ORDER — SODIUM CHLORIDE 0.9 % IV SOLN: 250.0000 mL | INTRAVENOUS | Status: DC | PRN

## 2014-08-13 MED ORDER — SODIUM CHLORIDE 0.9 % IJ SOLN: 3.0000 mL | Freq: Two times a day (BID) | INTRAMUSCULAR | Status: DC

## 2014-08-13 MED ORDER — AMPHETAMINE-DEXTROAMPHETAMINE 10 MG PO TABS
15.0000 mg | ORAL_TABLET | Freq: Two times a day (BID) | ORAL | Status: DC
  Administered 2014-08-13 (×2): 15 mg via ORAL
  Filled 2014-08-13 (×2): qty 2

## 2014-08-13 MED ORDER — SODIUM CHLORIDE 0.9 % IJ SOLN: 3.0000 mL | INTRAMUSCULAR | Status: DC | PRN

## 2014-08-13 MED ORDER — DILTIAZEM HCL 30 MG PO TABS
30.0000 mg | ORAL_TABLET | Freq: Two times a day (BID) | ORAL | Status: DC
  Administered 2014-08-13 – 2014-08-15 (×4): 30 mg via ORAL
  Filled 2014-08-13 (×7): qty 1

## 2014-08-13 MED ORDER — ADULT MULTIVITAMIN W/MINERALS CH
1.0000 | ORAL_TABLET | Freq: Every day | ORAL | Status: DC
  Administered 2014-08-13 – 2014-08-14 (×2): 1 via ORAL
  Filled 2014-08-13 (×4): qty 1

## 2014-08-13 MED ORDER — SODIUM CHLORIDE 0.9 % IV SOLN
INTRAVENOUS | Status: DC
  Administered 2014-08-14: 06:00:00 via INTRAVENOUS

## 2014-08-13 MED ORDER — OFF THE BEAT BOOK
Freq: Once | Status: AC
  Administered 2014-08-13: 02:00:00
  Filled 2014-08-13: qty 1

## 2014-08-13 MED ORDER — SODIUM CHLORIDE 0.9 % IJ SOLN
3.0000 mL | Freq: Two times a day (BID) | INTRAMUSCULAR | Status: DC
  Administered 2014-08-13: 3 mL via INTRAVENOUS

## 2014-08-13 MED ORDER — ASPIRIN EC 81 MG PO TBEC
81.0000 mg | DELAYED_RELEASE_TABLET | Freq: Every day | ORAL | Status: DC
  Administered 2014-08-13 – 2014-08-14 (×2): 81 mg via ORAL
  Filled 2014-08-13 (×4): qty 1

## 2014-08-13 MED ORDER — METOPROLOL TARTRATE 25 MG PO TABS
25.0000 mg | ORAL_TABLET | Freq: Two times a day (BID) | ORAL | Status: DC
  Administered 2014-08-13 – 2014-08-15 (×5): 25 mg via ORAL
  Filled 2014-08-13 (×6): qty 1

## 2014-08-13 NOTE — Progress Notes (Signed)
Utilization review completed.  

## 2014-08-13 NOTE — Progress Notes (Signed)
Ref: Jose Good, MD   Subjective:  Feeling better. Converted to sinus rhythm. Afebrile.  Objective:  Vital Signs in the last 24 hours: Temp:  [97.9 F (36.6 C)-98.5 F (36.9 C)] 97.9 F (36.6 C) (10/11 0501) Pulse Rate:  [49-150] 55 (10/11 1009) Cardiac Rhythm:  [-] Sinus bradycardia (10/11 0858) Resp:  [13-20] 17 (10/11 0501) BP: (96-145)/(64-95) 113/71 mmHg (10/11 1009) SpO2:  [95 %-100 %] 98 % (10/11 1009) Weight:  [107.956 kg (238 lb)-110.6 kg (243 lb 13.3 oz)] 110.6 kg (243 lb 13.3 oz) (10/11 0026)  Physical Exam: BP Readings from Last 1 Encounters:  08/13/14 113/71    Wt Readings from Last 1 Encounters:  08/13/14 110.6 kg (243 lb 13.3 oz)    Weight change:   HEENT: [place]/AT, Eyes-Wears glasses,  PERL, EOMI, Conjunctiva-Pink, Sclera-Non-icteric Neck: No JVD, No bruit, Trachea midline. Lungs:  Clear, Bilateral. Cardiac:  Regular rhythm, normal S1 and S2, no S3. III/VI systolic murmur. Abdomen:  Soft, non-tender. Extremities:  No edema present. No cyanosis. No clubbing. CNS: AxOx3, Cranial nerves grossly intact, moves all 4 extremities. Right handed. Skin: Warm and dry.   Intake/Output from previous day:      Lab Results: BMET    Component Value Date/Time   NA 139 08/13/2014 0345   NA 141 08/12/2014 1614   K 4.3 08/13/2014 0345   K 4.7 08/12/2014 1614   CL 102 08/13/2014 0345   CL 103 08/12/2014 1614   CO2 22 08/13/2014 0345   CO2 26 08/12/2014 1614   GLUCOSE 134* 08/13/2014 0345   GLUCOSE 111* 08/12/2014 1614   BUN 25* 08/13/2014 0345   BUN 25* 08/12/2014 1614   CREATININE 1.08 08/13/2014 0345   CREATININE 1.06 08/12/2014 1614   CALCIUM 8.5 08/13/2014 0345   CALCIUM 9.3 08/12/2014 1614   GFRNONAA 70* 08/13/2014 0345   GFRNONAA 71* 08/12/2014 1614   GFRAA 81* 08/13/2014 0345   GFRAA 83* 08/12/2014 1614   CBC    Component Value Date/Time   WBC 9.3 08/13/2014 0649   RBC 4.88 08/13/2014 0649   HGB 14.0 08/13/2014 0649   HCT 41.7 08/13/2014  0649   PLT 185 08/13/2014 0649   MCV 85.5 08/13/2014 0649   MCH 28.7 08/13/2014 0649   MCHC 33.6 08/13/2014 0649   RDW 14.9 08/13/2014 0649   HEPATIC Function Panel No results found for this basename: PROT,  ALBUMIN,  AST,  ALT,  ALKPHOS,  BILIDIR,  IBILI,  in the last 8760 hours HEMOGLOBIN A1C No components found with this basename: HGA1C,  MPG   CARDIAC ENZYMES Lab Results  Component Value Date   TROPONINI 0.34* 08/13/2014   TROPONINI 0.36* 08/13/2014   TROPONINI 0.35* 08/12/2014   BNP  Recent Labs  08/12/14 1614  PROBNP 193.9*   TSH  Recent Labs  08/12/14 1940  TSH 4.570*   CHOLESTEROL No results found for this basename: CHOL,  in the last 8760 hours  Scheduled Meds: . amphetamine-dextroamphetamine  15 mg Oral BID AC  . amphetamine-dextroamphetamine  30 mg Oral Q24H  . aspirin EC  81 mg Oral Daily  . atorvastatin  40 mg Oral q1800  . colchicine  0.6 mg Oral Daily  . folic acid  956 mcg Oral BID  . losartan  100 mg Oral Daily  . metoprolol tartrate  50 mg Oral BID  . multivitamin with minerals  1 tablet Oral Daily  . olopatadine  1 drop Both Eyes BID  . pantoprazole  40 mg Oral  Daily  . [START ON 08/14/2014] PARoxetine  10 mg Oral QODAY  . sodium chloride  3 mL Intravenous Q12H  . sodium chloride  3 mL Intravenous Q12H   Continuous Infusions: . diltiazem (CARDIZEM) infusion Stopped (08/12/14 2152)  . heparin 1,400 Units/hr (08/13/14 0901)   PRN Meds:.sodium chloride, sodium chloride  Assessment/Plan: Possible small NSTEMI Atrial fibrillation with rapid ventricular response  Hypertension  Hyperlipidemia  Cardiac cath in AM     LOS: 1 day    Dixie Dials  MD  08/13/2014, 11:20 AM

## 2014-08-13 NOTE — Progress Notes (Signed)
ANTICOAGULATION CONSULT NOTE - Follow Up Consult   Pharmacy Consult for Heparin Indication: chest pain/ACS and atrial fibrillation  Allergies  Allergen Reactions  . Neosporin [Neomycin-Bacitracin Zn-Polymyx]   . Vicodin [Hydrocodone-Acetaminophen]     Patient Measurements: Height: 6\' 2"  (188 cm) Weight: 243 lb 13.3 oz (110.6 kg) IBW/kg (Calculated) : 82.2 Heparin Dosing Weight: 105 kg  Vital Signs: Temp: 97.9 F (36.6 C) (10/11 0501) Temp Source: Oral (10/11 0501) BP: 117/71 mmHg (10/11 0501) Pulse Rate: 49 (10/11 0501)  Labs:  Recent Labs  08/12/14 1614  08/12/14 1637 08/12/14 1945 08/13/14 0130 08/13/14 0345 08/13/14 0649  HGB 15.9  --   --   --   --   --  14.0  HCT 46.3  --   --   --   --   --  41.7  PLT 219  --   --   --   --   --  185  LABPROT  --   --  12.9  --   --  14.0  --   INR  --   --  0.97  --   --  1.08  --   HEPARINUNFRC  --   --   --   --  0.48  --  0.80*  CREATININE 1.06  --   --   --   --  1.08  --   TROPONINI  --   < > 0.31* 0.35* 0.36*  --  0.34*  < > = values in this interval not displayed.  Estimated Creatinine Clearance: 89.1 ml/min (by C-G formula based on Cr of 1.08).   Medications:  Scheduled:  . amphetamine-dextroamphetamine  15 mg Oral BID AC  . amphetamine-dextroamphetamine  30 mg Oral Q24H  . aspirin EC  81 mg Oral Daily  . atorvastatin  40 mg Oral q1800  . colchicine  0.6 mg Oral Daily  . folic acid  951 mcg Oral BID  . losartan  100 mg Oral Daily  . metoprolol tartrate  50 mg Oral BID  . multivitamin with minerals  1 tablet Oral Daily  . olopatadine  1 drop Both Eyes BID  . pantoprazole  40 mg Oral Daily  . [START ON 08/14/2014] PARoxetine  10 mg Oral QODAY  . sodium chloride  3 mL Intravenous Q12H  . sodium chloride  3 mL Intravenous Q12H   Infusions:  . diltiazem (CARDIZEM) infusion Stopped (08/12/14 2152)  . heparin 1,500 Units/hr (08/12/14 1731)    Assessment: Jose Stone is a 68 yo male who presented to the ED  on 08/12/14 with chest pain and diaphoresis.  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  EKG showed atrial fibrillation with RVR. In addition, troponin levels were elevated, heparin was started.   Hgb 14, Hct 41.7, Plt 185 wnl.  Will continue to monitor for s/sx of bleeding.  Patient also currently on SCDs.    Goal of Therapy:  Heparin level 0.3-0.7 units/ml Monitor platelets by anticoagulation protocol: Yes   Plan:  Decrease Heparin to 1400 units/hr Heparin level in 6 hrs and daily Daily CBC Monitor for s/sx of bleeding  Hassie Bruce, Pharm. D. Clinical Pharmacy Resident Pager: [phone] Ph: [phone] 08/13/2014 9:00 AM

## 2014-08-13 NOTE — Progress Notes (Signed)
  Echocardiogram 2D Echocardiogram has been performed.  Mauricio Po 08/13/2014, 3:12 PM

## 2014-08-13 NOTE — Progress Notes (Signed)
ANTICOAGULATION CONSULT NOTE - Follow Up Consult  Pharmacy Consult for heparin Indication: chest pain/ACS and atrial fibrillation  Labs:  Recent Labs  08/12/14 1614 08/12/14 1637 08/12/14 1945 08/13/14 0130  HGB 15.9  --   --   --   HCT 46.3  --   --   --   PLT 219  --   --   --   LABPROT  --  12.9  --   --   INR  --  0.97  --   --   HEPARINUNFRC  --   --   --  0.48  CREATININE 1.06  --   --   --   TROPONINI  --  0.31* 0.35* 0.36*    Assessment/Plan:  68yo male therapeutic on heparin with initial dosing for CP and new Afib. Will continue gtt at current rate and confirm stable with additional level.   Wynona Neat, PharmD, BCPS  08/13/2014,2:30 AM

## 2014-08-13 NOTE — Progress Notes (Signed)
ANTICOAGULATION CONSULT NOTE - Follow Up Consult  Pharmacy Consult for Heparin Indication: CP and afib  Allergies  Allergen Reactions  . Neosporin [Neomycin-Bacitracin Zn-Polymyx]   . Vicodin [Hydrocodone-Acetaminophen]     Patient Measurements: Height: 6\' 2"  (188 cm) Weight: 243 lb 13.3 oz (110.6 kg) IBW/kg (Calculated) : 82.2 Heparin Dosing Weight: 105 kg  Vital Signs: Temp: 98.4 F (36.9 C) (10/11 1500) Temp Source: Oral (10/11 1500) BP: 125/72 mmHg (10/11 1500) Pulse Rate: 57 (10/11 1500)  Labs:  Recent Labs  08/12/14 1614  08/12/14 1637 08/12/14 1945 08/13/14 0130 08/13/14 0345 08/13/14 0649 08/13/14 1455  HGB 15.9  --   --   --   --   --  14.0  --   HCT 46.3  --   --   --   --   --  41.7  --   PLT 219  --   --   --   --   --  185  --   LABPROT  --   --  12.9  --   --  14.0  --   --   INR  --   --  0.97  --   --  1.08  --   --   HEPARINUNFRC  --   --   --   --  0.48  --  0.80* 0.42  CREATININE 1.06  --   --   --   --  1.08  --   --   TROPONINI  --   < > 0.31* 0.35* 0.36*  --  0.34*  --   < > = values in this interval not displayed.  Estimated Creatinine Clearance: 89.1 ml/min (by C-G formula based on Cr of 1.08).   Assessment: 68 yo male who presented to the ED on 08/12/14 with chest pain and diaphoresis x 2wks. 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 EKG showed atrial fibrillation with RVR.   PMH: HTN, HLD, CAD  Anticoagulation: CP + Afib. HL 0.48>>0.8>>0.42. CBC WNL.  Cards: HTN, HLD, CAD. Elevated troponins. Meds: ASA81, lipitor40, losartan, metoprolol  Endocrinology: Colchicine  Gastrointestinal / Nutrition: FA, po PPI  Neurology: Adderall, Paxil  Nephrology: Scr 1.08  Pulmonary  Hematology / Oncology  PTA Medication Issues  Best Practices  Goal of Therapy:  Heparin level 0.3-0.7 units/ml Monitor platelets by anticoagulation protocol: Yes   Plan:  Continue IV heparin at 1400 units/hr Daily HL and CBC    Saylah Ketner S.  Alford Highland, PharmD, BCPS Clinical Staff Pharmacist Pager [phone]  Eilene Ghazi Stillinger 08/13/2014,3:35 PM

## 2014-08-14 ENCOUNTER — Encounter (HOSPITAL_COMMUNITY)
Admission: EM | Disposition: A | Discharge status: Home / Self Care | Payer: Medicare Other | Source: Home / Self Care | Attending: Cardiovascular Disease

## 2014-08-14 HISTORY — PX: LEFT HEART CATHETERIZATION WITH CORONARY ANGIOGRAM: SHX5451

## 2014-08-14 LAB — BASIC METABOLIC PANEL
Anion gap: 9 (ref 5–15)
BUN: 18 mg/dL (ref 6–23)
CHLORIDE: 106 meq/L (ref 96–112)
CO2: 28 mEq/L (ref 19–32)
Calcium: 8.7 mg/dL (ref 8.4–10.5)
Creatinine, Ser: 1.05 mg/dL (ref 0.50–1.35)
GFR calc non Af Amer: 72 mL/min — ABNORMAL LOW (ref >90)
GFR, EST AFRICAN AMERICAN: 83 mL/min — AB (ref >90)
GLUCOSE: 98 mg/dL (ref 70–99)
POTASSIUM: 4.4 meq/L (ref 3.7–5.3)
Sodium: 143 mEq/L (ref 137–147)

## 2014-08-14 LAB — POCT ACTIVATED CLOTTING TIME: ACTIVATED CLOTTING TIME: 366 s

## 2014-08-14 LAB — CBC
HEMATOCRIT: 40.9 % (ref 39.0–52.0)
HEMOGLOBIN: 13.4 g/dL (ref 13.0–17.0)
MCH: 28.8 pg (ref 26.0–34.0)
MCHC: 32.8 g/dL (ref 30.0–36.0)
MCV: 87.8 fL (ref 78.0–100.0)
Platelets: 189 10*3/uL (ref 150–400)
RBC: 4.66 MIL/uL (ref 4.22–5.81)
RDW: 14.9 % (ref 11.5–15.5)
WBC: 8.1 10*3/uL (ref 4.0–10.5)

## 2014-08-14 LAB — PROTIME-INR
INR: 1.05 (ref 0.00–1.49)
Prothrombin Time: 13.7 seconds (ref 11.6–15.2)

## 2014-08-14 LAB — HEPARIN LEVEL (UNFRACTIONATED): Heparin Unfractionated: 0.51 IU/mL (ref 0.30–0.70)

## 2014-08-14 SURGERY — LEFT HEART CATHETERIZATION WITH CORONARY ANGIOGRAM
Anesthesia: LOCAL

## 2014-08-14 MED ORDER — TICAGRELOR 90 MG PO TABS
90.0000 mg | ORAL_TABLET | Freq: Two times a day (BID) | ORAL | Status: DC
  Administered 2014-08-14 – 2014-08-15 (×2): 90 mg via ORAL
  Filled 2014-08-14 (×3): qty 1

## 2014-08-14 MED ORDER — NITROGLYCERIN 1 MG/10 ML FOR IR/CATH LAB
INTRA_ARTERIAL | Status: AC
  Filled 2014-08-14: qty 10

## 2014-08-14 MED ORDER — TICAGRELOR 90 MG PO TABS
ORAL_TABLET | ORAL | Status: AC
  Filled 2014-08-14: qty 2

## 2014-08-14 MED ORDER — FENTANYL CITRATE 0.05 MG/ML IJ SOLN
INTRAMUSCULAR | Status: AC
  Filled 2014-08-14: qty 2

## 2014-08-14 MED ORDER — ASPIRIN 81 MG PO CHEW
81.0000 mg | CHEWABLE_TABLET | Freq: Every day | ORAL | Status: DC
  Administered 2014-08-15: 81 mg via ORAL
  Filled 2014-08-14 (×2): qty 1

## 2014-08-14 MED ORDER — MIDAZOLAM HCL 2 MG/2ML IJ SOLN
INTRAMUSCULAR | Status: AC
  Filled 2014-08-14: qty 2

## 2014-08-14 MED ORDER — NITROGLYCERIN IN D5W 200-5 MCG/ML-% IV SOLN
5.0000 ug/min | INTRAVENOUS | Status: DC
  Administered 2014-08-14: 12:00:00 5 ug/min via INTRAVENOUS
  Filled 2014-08-14: qty 250

## 2014-08-14 MED ORDER — LIDOCAINE HCL (PF) 1 % IJ SOLN
INTRAMUSCULAR | Status: AC
  Filled 2014-08-14: qty 30

## 2014-08-14 MED ORDER — HEPARIN (PORCINE) IN NACL 2-0.9 UNIT/ML-% IJ SOLN
INTRAMUSCULAR | Status: AC
  Filled 2014-08-14: qty 1000

## 2014-08-14 MED ORDER — ATROPINE SULFATE 0.1 MG/ML IJ SOLN
INTRAMUSCULAR | Status: AC
  Filled 2014-08-14: qty 10

## 2014-08-14 MED ORDER — ACETAMINOPHEN 325 MG PO TABS: 650.0000 mg | ORAL_TABLET | ORAL | Status: DC | PRN

## 2014-08-14 MED ORDER — ONDANSETRON HCL 4 MG/2ML IJ SOLN: 4.0000 mg | Freq: Four times a day (QID) | INTRAMUSCULAR | Status: DC | PRN

## 2014-08-14 MED ORDER — BIVALIRUDIN 250 MG IV SOLR
INTRAVENOUS | Status: AC
  Filled 2014-08-14: qty 250

## 2014-08-14 MED ORDER — SODIUM CHLORIDE 0.9 % IV SOLN
INTRAVENOUS | Status: AC
  Administered 2014-08-14: 10:00:00 via INTRAVENOUS

## 2014-08-14 MED ORDER — ASPIRIN 81 MG PO CHEW
CHEWABLE_TABLET | ORAL | Status: AC
  Administered 2014-08-14: 81 mg
  Filled 2014-08-14: qty 1

## 2014-08-14 NOTE — Progress Notes (Signed)
Site area: right groin  Site Prior to Removal:  Level 0  Pressure Applied For 20 MINUTES    Minutes Beginning at 1130  Manual:   Yes.    Patient Status During Pull:  stable  Post Pull Groin Site:  Level 0  Post Pull Instructions Given:  Yes.    Post Pull Pulses Present:  Yes.    Dressing Applied:  Yes.    Comments:

## 2014-08-14 NOTE — CV Procedure (Signed)
PROCEDURE:  Left heart catheterization with selective coronary angiography, left ventriculogram.  CLINICAL HISTORY:  This is a 68 year old male with unstable angina and atrial fibrillation has abnormal cardiac enzymes.  The risks, benefits, and details of the procedure were explained to the patient.  The patient verbalized understanding and wanted to proceed.  Informed written consent was obtained.  PROCEDURE TECHNIQUE:  The patient was approached from the right femoral artery using a 5 French short sheath.  Left coronary angiography was done using a Judkins L4 guide catheter.  Right coronary angiography was done using a Judkins R4 guide catheter.  Left ventriculography was done using a pigtail catheter.    CONTRAST:  Total of 70 cc.  COMPLICATIONS:  None.  At the end of the procedure a manual pressure was used for hemostasis.    HEMODYNAMICS:  Aortic pressure was 146/80; LV pressure was 146/11; LVEDP 20.  There was no gradient between the left ventricle and aorta.    ANGIOGRAM/CORONARY ARTERIOGRAM:   The left main coronary artery is double barrell.  The left anterior descending artery has origin to proximal luminal irregularities followed by 60 % stenosis and mid vessel 60 % stenoses. Diagonal 1 and 2 are with luminal irregularities.  The left circumflex artery is dominant and has origin to proximal 30 % narrowing with long ectatic area, then 60 % narrowing and distal 80 % narrowing. OM 1 has proximal focal eccentric 80-90 % stenosis. OM 2 has mild disease.  The right coronary artery is non-dominant and has mild disease.  Left subclavian injection showed patent LIMA.  LEFT VENTRICULOGRAM:  Left ventricular angiogram was done in the 30 RAO projection and revealed normal left ventricular wall motion and systolic function with an estimated ejection fraction of 65%.  LVEDP was 20 mmHg.  IMPRESSION OF HEART CATHETERIZATION:   1. Normal left main coronary artery. 2. Moderate to severe disease  of left anterior descending artery and its branches. 3. Severe disease of left circumflex artery and its branches. 4. Mild disease of non-dominant right coronary artery. 5. Normal left ventricular systolic function.  LVEDP 20 mmHg.  Ejection fraction 65%. 6.  Patent LIMA.  RECOMMENDATION:   Stent placement in OM today and LAD stent in near future. Dr. Terrence Dupont notified.

## 2014-08-14 NOTE — Care Management Note (Addendum)
  Page 2 of 2   08/15/2014     12:21:22 PM CARE MANAGEMENT NOTE 08/15/2014  Patient:  Jose Stone, Jose Stone   Account Number:  [id number]  Date Initiated:  08/14/2014  Documentation initiated by:  Jose Stone  Subjective/Objective Assessment:   CP     Action/Plan:   CM to follow for dipsosition needs   Anticipated DC Date:  08/15/2014   Anticipated DC Plan:  [place]  CM consult  Medication Assistance      Choice offered to / List presented to:             Status of service:  Completed, signed off Medicare Important Message given?   (If response is "NO", the following Medicare IM given date fields will be blank) Date Medicare IM given:   Medicare IM given by:   Date Additional Medicare IM given:   Additional Medicare IM given by:    Discharge Disposition:  HOME/SELF CARE  Per UR Regulation:  Reviewed for med. necessity/level of care/duration of stay  If discussed at [place] of Stay Meetings, dates discussed:    Comments:  Jose Upadhyay RN, BSN, MSHL, CCM  Nurse - Case Manager,  (Unit [phone]  08/15/2014 Benefits update: S/W Jose Stone @ [employer]  # [phone] TRICAGRELOR _ NOT ON FORMULARY BRILINTA 90 MG BID COVER-YES CO-PAY- $ 65.26  30 DAY SUPPLY TIER-2 DRUG PRIOR APPROVAL-NO PHARMACY: [facility], Jose Stone, [facility]   Jose Garguilo RN, BSN, MSHL, CCM  Nurse - Case Manager,  (Unit [phone]  08/14/2014 Benefits Check:  ticagrelor (BRILINTA) tablet 90 mg BID - in progress

## 2014-08-14 NOTE — Progress Notes (Signed)
UR completed Icela Glymph K. Tawyna Pellot, RN, BSN, MSHL, CCM  08/14/2014 3:35 PM

## 2014-08-14 NOTE — Interval H&P Note (Signed)
History and Physical Interval Note:  08/14/2014 7:30 AM  Jose Stone  has presented today for surgery, with the diagnosis of Chest pain/Abnormal cardiac enzyme  The various methods of treatment have been discussed with the patient and family. After consideration of risks, benefits and other options for treatment, the patient has consented to  Procedure(s): LEFT HEART CATHETERIZATION WITH CORONARY ANGIOGRAM (N/A) as a surgical intervention .  The patient's history has been reviewed, patient examined, no change in status, stable for surgery.  I have reviewed the patient's chart and labs.  Questions were answered to the patient's satisfaction.     Yeraldine Forney S

## 2014-08-14 NOTE — CV Procedure (Signed)
PTCA stenting report dictated on 08/14/2014 dictation number is [id number]

## 2014-08-15 LAB — CBC
HCT: 39.8 % (ref 39.0–52.0)
Hemoglobin: 13.4 g/dL (ref 13.0–17.0)
MCH: 28.6 pg (ref 26.0–34.0)
MCHC: 33.7 g/dL (ref 30.0–36.0)
MCV: 84.9 fL (ref 78.0–100.0)
PLATELETS: 170 10*3/uL (ref 150–400)
RBC: 4.69 MIL/uL (ref 4.22–5.81)
RDW: 14.9 % (ref 11.5–15.5)
WBC: 8.2 10*3/uL (ref 4.0–10.5)

## 2014-08-15 LAB — BASIC METABOLIC PANEL
ANION GAP: 12 (ref 5–15)
BUN: 14 mg/dL (ref 6–23)
CHLORIDE: 106 meq/L (ref 96–112)
CO2: 26 meq/L (ref 19–32)
Calcium: 8.7 mg/dL (ref 8.4–10.5)
Creatinine, Ser: 1.05 mg/dL (ref 0.50–1.35)
GFR calc non Af Amer: 72 mL/min — ABNORMAL LOW (ref >90)
GFR, EST AFRICAN AMERICAN: 83 mL/min — AB (ref >90)
Glucose, Bld: 106 mg/dL — ABNORMAL HIGH (ref 70–99)
Potassium: 4.3 mEq/L (ref 3.7–5.3)
SODIUM: 144 meq/L (ref 137–147)

## 2014-08-15 MED ORDER — TICAGRELOR 90 MG PO TABS: 90.0000 mg | ORAL_TABLET | Freq: Two times a day (BID) | ORAL | Status: DC

## 2014-08-15 MED ORDER — DILTIAZEM HCL 30 MG PO TABS: 30.0000 mg | ORAL_TABLET | Freq: Two times a day (BID) | ORAL | Status: DC

## 2014-08-15 MED ORDER — METOPROLOL TARTRATE 25 MG PO TABS: 25.0000 mg | ORAL_TABLET | Freq: Two times a day (BID) | ORAL | Status: DC

## 2014-08-15 MED FILL — Sodium Chloride IV Soln 0.9%: INTRAVENOUS | Qty: 50 | Status: AC

## 2014-08-15 NOTE — Discharge Summary (Signed)
Physician Discharge Summary  [name] MRN: [id number] DOB/AGE: [birth year] 66 y.o.  Admit date: 08/12/2014 Discharge date: 08/15/2014  Admission Diagnoses: Chest pain  Atrial fibrillation with rapid ventricular response  Hypertension  Hyperlipidemia  Discharge Diagnoses:  Principle Problem: * NSTEMI * Atrial fibrillation with rapid ventricular response Native vessel, multivessel, CAD Stent in OM of Left circumflex coronary artery  Hypertension  Hyperlipidemia Small perimembranous VSD Ascending aortic aneurysm  Discharged Condition: fair  Hospital Course: 68 y.o. male with PMH significant for HTN, HLP, CAD, presented to the ED for chest pain and diaphoresis. He also had heart palpitations with some intermittent lightheadedness. EKG showed atrial fibrillation with rapid ventricular response and LVH with strain. He responded to IV diltiazem and metoprolol with restoration of sinus rhythm. Cardiac enzymes were minimally elevated hence he underwent left heart catheterization showing severe disease of OM and Left Circumflex coronary arteries and moderate disease of LAD coronary artery. Xience Alpine 2.5 x 15 mm Stent was placed in mid OM 90 % stenosis. Distal left circumflex had very small distal vessel. LAD disease will be evaluated by nuclear stress test in 1-2 months for ischemia burden.  Consults: cardiology  Significant Diagnostic Studies: labs: Normal CBC and BMET. Minimally elevated Troponin-I.  EKG-On admission: Atrial fibrillation with rapid ventricular response. EKG-08/14/2014-SR with incomplete RBBB  CXR-Unremarkable.  Echocardiogram showed:   Left ventricle: Small perimembranous VSD The cavity size was normal. Wall thickness was normal. Systolic function was normal. The estimated ejection fraction was in the range of 55% to 60%. - Aortic valve: There was trivial regurgitation. 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 - Left atrium: The atrium was  mildly dilated.  Cardiac cath showed: 1. Normal left main coronary artery. 2. Moderate to severe disease of left anterior descending artery and its branches. 3. Severe disease of left circumflex artery and its branches. 4. Mild disease of non-dominant right coronary artery. 5. Normal left ventricular systolic function. LVEDP 20 mmHg. Ejection fraction 65%.        6. Patent LIMA.        7. 2.5 x 15 mm Drug eluting Xience stent in OM 1 of left circumflex coronary artery.   Treatments: cardiac meds: metoprolol, diltiazem and losartan and anticoagulation: ASA and Brilinta.  Cardiac cath followed by 2.5 x 15 Xience drug eluting stent in OM 1 of left circumflex coronary artery.  Discharge Exam: Blood pressure 151/84, pulse 65, temperature 97.9 F (36.6 C), temperature source Oral, resp. rate 18, height 6\' 2"  (1.88 m), weight 109.5 kg (241 lb 6.5 oz), SpO2 95.00%. HEENT: [place]/AT, Eyes-Wears glasses, PERL, EOMI, Conjunctiva-Pink, Sclera-Non-icteric  Neck: No JVD, No bruit, Trachea midline.  Lungs: Clear, Bilateral.  Cardiac: Regular rhythm, normal S1 and S2, no S3. III/VI systolic and II/VI diastolic murmur.  Abdomen: Soft, non-tender.  Extremities: No edema present. No cyanosis. No clubbing.  CNS: AxOx3, Cranial nerves grossly intact, moves all 4 extremities. Right handed.  Skin: Warm and dry   Disposition: Final discharge disposition not confirmed  Discharge Instructions   Amb Referral to Cardiac Rehabilitation    Complete by:  As directed             Medication List         amphetamine-dextroamphetamine 30 MG tablet  Commonly known as:  ADDERALL  Take 15-30 mg by mouth 3 (three) times daily. Take 30mg  in the morning. Take 15mg  in the afternoon and evening     aspirin 81 MG tablet  Take 81 mg by mouth daily.     atorvastatin 40 MG tablet  Commonly known as:  LIPITOR     b complex vitamins capsule  Take 1 capsule by mouth daily.     cholecalciferol 1000 UNITS tablet   Commonly known as:  VITAMIN D  Take 2,000 Units by mouth daily.     COLCRYS 0.6 MG tablet  Generic drug:  colchicine     diltiazem 30 MG tablet  Commonly known as:  CARDIZEM  Take 1 tablet (30 mg total) by mouth every 12 (twelve) hours.     folic acid 233 MCG tablet  Commonly known as:  FOLVITE  Take 800 mcg by mouth 2 (two) times daily.     losartan 100 MG tablet  Commonly known as:  COZAAR     metoprolol tartrate 25 MG tablet  Commonly known as:  LOPRESSOR  Take 1 tablet (25 mg total) by mouth 2 (two) times daily.     omega-3 acid ethyl esters 1 G capsule  Commonly known as:  LOVAZA  Take 1 g by mouth daily.     omeprazole 20 MG capsule  Commonly known as:  PRILOSEC  Take 20 mg by mouth daily.     PARoxetine 20 MG tablet  Commonly known as:  PAXIL  Take 10 mg by mouth every other day.     PATADAY 0.2 % Soln  Generic drug:  Olopatadine HCl  Place 1 drop into both eyes every evening.     ticagrelor 90 MG Tabs tablet  Commonly known as:  BRILINTA  Take 1 tablet (90 mg total) by mouth 2 (two) times daily.           Follow-up Information   Follow up with Valaria Good, MD. Schedule an appointment as soon as possible for a visit in 2 weeks.   Specialty:  Internal Medicine   Contact information:   [address] [place] [address] [phone]       Follow up with [facility] S, MD. Schedule an appointment as soon as possible for a visit in 1 month.   Specialty:  Cardiology   Contact information:   [place] [address] [phone]       Signed: Birdie Riddle 08/15/2014, 10:03 AM

## 2014-08-15 NOTE — Progress Notes (Signed)
CARDIAC REHAB PHASE I   PRE:  Rate/Rhythm: 61 SR  BP:  Supine:   Sitting: 139/78  Standing:    SaO2:   MODE:  Ambulation: 800 ft   POST:  Rate/Rhythm: 79 SR  BP:  Supine:   Sitting: 178/72  Standing:    SaO2:  0850-1000 Pt walked 800 ft with steady gait. Tolerated well. No CP. Remained in NSR. Education completed with pt who voiced understanding. Gave brilinta booklet and stressed importance of taking. Discussed CRP 2 and will refer to [place]. Pt knows that if staged intervention needed that he may have to wait to start CRP2.   Graylon Good, RN BSN  08/15/2014 9:55 AM

## 2014-08-17 NOTE — Op Note (Signed)
Jose Stone, USELMAN                ACCOUNT NO.:  [id number]  MEDICAL RECORD NO.:  [id number]  LOCATION:                               FACILITY:  [facility]  PHYSICIAN:  Nonnie Pickney N. Terrence Dupont, M.D. DATE OF BIRTH:  1946-10-14  DATE OF PROCEDURE:  08/14/2014 DATE OF DISCHARGE:  08/15/2014                              OPERATIVE REPORT   PROCEDURE: 1. Successful percutaneous transluminal coronary angioplasty to obtuse     marginal 1 using 2.5 x 12 mm long Emerge balloon for predilatation. 2. Successful deployment of 2.5 x 15 mm long Xience Alpine drug-     eluting stent in proximal obtuse marginal 1. 3. Successful postdilatation of the stent using 2.75 x 12 mm long [place]     Emerge balloon.  INDICATION FOR THE PROCEDURE:  [name] is a 68 year old male with past medical history significant for hypertension, hyperlipidemia, coronary artery disease, who was admitted by Dr. Doylene Canard because of recurrent chest pain, diaphoresis, and palpitation associated with shortness of breath.  The patient was noted to be in AFib with rapid ventricular response.  EKG showed LVH with strain pattern.  The patient ruled in for very small non-Q-wave myocardial infarction due to elevated cardiac enzymes and subsequently underwent left cardiac catheterization by Dr. Doylene Canard, which showed critical stenosis in obtuse marginal 1, which was felt to be the culprit lesion for a very small non-Q-wave myocardial infarction.  The patient also was noted to have significant 60-70% mid sequential stenosis and also distal left circumflex stenosis which was very small vessel distally supplying only small area of myocardium.  I was called for PCI.  INTERVENTIONAL PROCEDURE:  After obtaining the informed consent, a 5- French sheath was exchanged to 6-French sheath without difficulty. Next, a 6-French [place] left 3.5 guiding catheter was advanced under fluoroscopic guidance up to the ascending aorta.  Wire was pulled out. The catheter  was aspirated and connected to the Manifold.  Catheter was further advanced and engaged into left coronary ostium.  Multiple views of the left system were taken.  Findings were as above.  Successful PTCA to proximal OM 1 was done using 2.5 x 12 mm long Emerge balloon for predilatation and then 2.5 x 15 mm long Xience Alpine drug- eluting stent was deployed in proximal OM 1 to 11 atmospheric pressure. The stent was post dilated using 2.75 x 12 mm long [place] Emerge balloon going up to 15 atmospheric pressure.  Lesion dilated from 85-90% to 0% residual with excellent TIMI grade 3 distal flow without evidence of dissection or distal embolization.  The patient received weight based Angiomax and 180 mg of Brilinta prior to the procedure.  The patient tolerated the procedure well.  The patient was transferred to recovery room in stable condition.  The patient will need PCI to LAD/FFR/stress test in the future.  Discussed with Dr. Doylene Canard.    Allegra Lai. Terrence Dupont, M.D.    MNH/MEDQ  D:  08/14/2014  T:  08/15/2014  Job:  [id number]

## 2014-08-25 DIAGNOSIS — H10403 Unspecified chronic conjunctivitis, bilateral: Secondary | ICD-10-CM | POA: Diagnosis not present

## 2014-08-25 DIAGNOSIS — H11002 Unspecified pterygium of left eye: Secondary | ICD-10-CM | POA: Diagnosis not present

## 2014-08-29 DIAGNOSIS — I4891 Unspecified atrial fibrillation: Secondary | ICD-10-CM | POA: Diagnosis not present

## 2014-08-29 DIAGNOSIS — I2510 Atherosclerotic heart disease of native coronary artery without angina pectoris: Secondary | ICD-10-CM | POA: Diagnosis not present

## 2014-08-29 DIAGNOSIS — E780 Pure hypercholesterolemia: Secondary | ICD-10-CM | POA: Diagnosis not present

## 2014-08-29 DIAGNOSIS — I10 Essential (primary) hypertension: Secondary | ICD-10-CM | POA: Diagnosis not present

## 2014-08-29 DIAGNOSIS — Z09 Encounter for follow-up examination after completed treatment for conditions other than malignant neoplasm: Secondary | ICD-10-CM | POA: Diagnosis not present

## 2014-09-05 DIAGNOSIS — Z23 Encounter for immunization: Secondary | ICD-10-CM | POA: Diagnosis not present

## 2014-09-14 DIAGNOSIS — I25110 Atherosclerotic heart disease of native coronary artery with unstable angina pectoris: Secondary | ICD-10-CM | POA: Diagnosis not present

## 2014-09-14 DIAGNOSIS — I214 Non-ST elevation (NSTEMI) myocardial infarction: Secondary | ICD-10-CM | POA: Diagnosis not present

## 2014-09-14 DIAGNOSIS — I10 Essential (primary) hypertension: Secondary | ICD-10-CM | POA: Diagnosis not present

## 2014-09-14 DIAGNOSIS — I480 Paroxysmal atrial fibrillation: Secondary | ICD-10-CM | POA: Diagnosis not present

## 2014-10-05 DIAGNOSIS — F902 Attention-deficit hyperactivity disorder, combined type: Secondary | ICD-10-CM | POA: Diagnosis not present

## 2014-10-05 DIAGNOSIS — F411 Generalized anxiety disorder: Secondary | ICD-10-CM | POA: Diagnosis not present

## 2014-10-12 ENCOUNTER — Encounter (HOSPITAL_COMMUNITY): Payer: Self-pay | Admitting: Cardiovascular Disease

## 2014-10-12 DIAGNOSIS — E669 Obesity, unspecified: Secondary | ICD-10-CM | POA: Diagnosis not present

## 2014-10-12 DIAGNOSIS — I480 Paroxysmal atrial fibrillation: Secondary | ICD-10-CM | POA: Diagnosis not present

## 2014-10-12 DIAGNOSIS — I25110 Atherosclerotic heart disease of native coronary artery with unstable angina pectoris: Secondary | ICD-10-CM | POA: Diagnosis not present

## 2014-10-12 DIAGNOSIS — I10 Essential (primary) hypertension: Secondary | ICD-10-CM | POA: Diagnosis not present

## 2014-11-16 ENCOUNTER — Ambulatory Visit (INDEPENDENT_AMBULATORY_CARE_PROVIDER_SITE_OTHER): Payer: Medicare Other | Admitting: Internal Medicine

## 2014-11-16 ENCOUNTER — Encounter: Payer: Self-pay | Admitting: Internal Medicine

## 2014-11-16 VITALS — BP 112/72 | HR 52 | Ht 74.0 in | Wt 242.7 lb

## 2014-11-16 DIAGNOSIS — I2510 Atherosclerotic heart disease of native coronary artery without angina pectoris: Secondary | ICD-10-CM | POA: Insufficient documentation

## 2014-11-16 DIAGNOSIS — Q210 Ventricular septal defect: Secondary | ICD-10-CM | POA: Diagnosis not present

## 2014-11-16 DIAGNOSIS — I10 Essential (primary) hypertension: Secondary | ICD-10-CM | POA: Diagnosis not present

## 2014-11-16 DIAGNOSIS — E785 Hyperlipidemia, unspecified: Secondary | ICD-10-CM | POA: Insufficient documentation

## 2014-11-16 DIAGNOSIS — K219 Gastro-esophageal reflux disease without esophagitis: Secondary | ICD-10-CM

## 2014-11-16 DIAGNOSIS — I2583 Coronary atherosclerosis due to lipid rich plaque: Secondary | ICD-10-CM

## 2014-11-16 DIAGNOSIS — I4891 Unspecified atrial fibrillation: Secondary | ICD-10-CM | POA: Diagnosis not present

## 2014-11-16 NOTE — Patient Instructions (Addendum)
Your physician recommends that you return for lab work at your convenience - fasting  Your physician has recommended you make the following change in your medication: STOP diltiazem  You have been referred to cardiac rehab at [facility]  >> order faxed to [phone]  Your physician recommends that you schedule a follow-up appointment in: 3 months.

## 2014-11-16 NOTE — Progress Notes (Signed)
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 This is never caused issues in fact she's had right and left heart catheterizations dating back to the 1970s. 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 Other coronary risk factors include hypertension, dyslipidemia and family history of heart disease. He was sent for cardiac catheterization in 2006 for follow-up of VSD, at which time he had an echocardiogram which showed mild aortic regurgitation, a small membranous VSD and left atrial enlargement with an EF of 70%. He had a stress test at that time which showed findings concerning for him for a septal and inferoapical ischemia. He therefore was referred to left heart catheterization with strep demonstrated a 50-70% mid LAD stenosis and mild disease of the mid circumflex and first obtuse marginal branches. He's been managed medically since that time and previously saw Dr. Daneen Schick.  Recently he's been having some symptoms of sweating, palpitations and dizziness and eventually was found to be in atrial fibrillation when he presented to the hospital. At that time his troponin was elevated. He is placed on diltiazem which slowed his heart rate and he converted back to sinus spontaneously. Based on his elevated troponin, he was referred for left heart catheterization by Dr. Doylene Canard. This demonstrated a 60-70% mid LAD stenosis and a 90% obtuse marginal stenosis. Subsequently he underwent coronary intervention with placement of a 2.5 x 15 mm Xience Alpine stent to the mid OM. After this he became asymptomatic. He was going to have stress testing to determine the significance of his mid LAD lesion however it appears to be not significantly changed  since his heart catheterization 2006. He reports no further symptoms of atrial fibrillation but is concerned about bradycardia. He was not referred for cardiac rehabilitation due to the thought that he may need additional intervention to the LAD.  PMHx:  Past Medical History  Diagnosis Date  . Hypertension   . Hyperlipidemia     VSD  . GERD (gastroesophageal reflux disease)   . CAD (coronary artery disease)     Past Surgical History  Procedure Laterality Date  . Cardiac catheterization    . Left heart catheterization with coronary angiogram N/A 08/14/2014    Procedure: LEFT HEART CATHETERIZATION WITH CORONARY ANGIOGRAM;  Surgeon: Birdie Riddle, MD;  Location: [facility];  Service: Cardiovascular;  Laterality: N/A;  . Tonsillectomy  1955  . Shoulder surgery      FAMHx:  Family History  Problem Relation Age of Onset  . Pancreatic cancer Mother   . Aortic aneurysm Maternal Grandmother   . Heart attack Maternal Grandfather   . Diabetes Paternal Grandfather   . CAD Paternal Grandfather     SOCHx:   reports that he has never smoked. He has never used smokeless tobacco. He reports that he does not drink alcohol or use illicit drugs.  ALLERGIES:  Allergies  Allergen Reactions  . Neosporin [Neomycin-Bacitracin Zn-Polymyx]   . Vicodin [Hydrocodone-Acetaminophen]   . Percocet [Oxycodone-Acetaminophen] Rash    ROS: A comprehensive review of systems was negative except for: Constitutional: positive for fatigue Respiratory: positive for dyspnea on exertion Behavioral/Psych: positive for ADHD  HOME MEDS: Current Outpatient Prescriptions  Medication Sig Dispense Refill  .  amphetamine-dextroamphetamine (ADDERALL) 30 MG tablet Take 15-30 mg by mouth 3 (three) times daily. Take 30mg  in the morning. Take 15mg  in the afternoon and evening    . aspirin 81 MG tablet Take 81 mg by mouth daily.    Marland Kitchen atorvastatin (LIPITOR) 40 MG tablet Take 40 mg by mouth daily at 6 PM.     . b complex  vitamins capsule Take 1 capsule by mouth daily.    . cholecalciferol (VITAMIN D) 1000 UNITS tablet Take 2,000 Units by mouth daily.    Marland Kitchen COLCRYS 0.6 MG tablet Take 0.6 mg by mouth daily.     . folic acid (FOLVITE) 462 MCG tablet Take 800 mcg by mouth 2 (two) times daily.    Marland Kitchen losartan (COZAAR) 100 MG tablet Take 100 mg by mouth daily.     . metoprolol tartrate (LOPRESSOR) 25 MG tablet Take 1 tablet (25 mg total) by mouth 2 (two) times daily. 60 tablet 3  . omega-3 acid ethyl esters (LOVAZA) 1 G capsule Take 1 g by mouth daily.    Marland Kitchen omeprazole (PRILOSEC) 20 MG capsule Take 20 mg by mouth daily.    Marland Kitchen PARoxetine (PAXIL) 20 MG tablet Take 10 mg by mouth every other day.     Marland Kitchen PATADAY 0.2 % SOLN Place 1 drop into both eyes every evening.     . ticagrelor (BRILINTA) 90 MG TABS tablet Take 1 tablet (90 mg total) by mouth 2 (two) times daily. 60 tablet 6   No current facility-administered medications for this visit.    LABS/IMAGING: No results found for this or any previous visit (from the past 48 hour(s)). No results found.  VITALS: BP 112/72 mmHg  Pulse 52  Ht 6\' 2"  (1.88 m)  Wt 242 lb 11.2 oz (110.088 kg)  BMI 31.15 kg/m2  EXAM: General appearance: alert and no distress Neck: no carotid bruit and no JVD Lungs: clear to auscultation bilaterally Heart: regular rate and rhythm, S1, S2 normal, systolic murmur: systolic ejection 3/6, blowing throughout the precordium and diastolic murmur: early diastolic 2/6, crescendo at apex Abdomen: soft, non-tender; bowel sounds normal; no masses,  no organomegaly Extremities: extremities normal, atraumatic, no cyanosis or edema Pulses: 2+ and symmetric Skin: mild acrocyanosis Neurologic: Grossly normal Psych: Pleasant, somewhat inattentive  EKG: Sinus bradycardia 52  ASSESSMENT: 1. Coronary artery disease status post PCI to the OM with a Xience Alpine DES (2.515 mm) 2. Residual moderate to severe mid LAD  disease 3. Hypertension 4. Dyslipidemia 5. A. fib with RVR-spontaneously converted, possibly related to ischemia 6. Small perimembranous VSD  PLAN: 1.   Mr. Levan is asymptomatic now after stenting of his OM branch which was at least 90% stenotic. He's had no further symptoms to suggest that his stable LAD stenosis is causing ischemia. I do not see need for additional workup other than he is considering cardiac rehabilitation. Based on this I would recommend a symptom limited stress test prior to starting cardiac rehabilitation and also to establish his exercise parameters. He recently had lipid work performed in August 2015 which showed total cholesterol 157, triglycerides 94, HDL 44 and LDL 94. He's currently taking atorvastatin. His LDL goals will certainly be lower than this. I would like to recheck a lipid NMR. Blood pressure is generally well controlled. He does have some concerns about bradycardia and at this point given the fact that he's had no recurrence of A. fib, I would recommend discontinuing his Cardizem, which she's only taking the short-acting twice  daily (instead of 4 times daily). Hopefully this will translate to increased heart rates. He is on aspirin and Brilinta and will need to remain on this dual antiplatelet therapy for at least one year without interruption. His CHADSVASC score is 1 for hypertension however given the fact he is currently on dual antiplatelet therapy and had a short amount of A. fib, which is probably related to his ischemia, I would not recommend additional anticoagulation at this time. Should he develop more atrial fibrillation, we may need to add a third anticoagulant, but this would be associated with very high bleeding risk. His burden of A. fib appears to be very low, and as mentioned may not recur since he's been stented.  Plan follow-up in 3 months after cardiac rehabilitation.  [name], MD, [facility] Attending Cardiologist [facility]  Siler Mavis C 11/16/2014, 1:18 PM

## 2014-11-21 DIAGNOSIS — E785 Hyperlipidemia, unspecified: Secondary | ICD-10-CM | POA: Diagnosis not present

## 2014-11-23 LAB — NMR LIPOPROFILE WITH LIPIDS
Cholesterol, Total: 182 mg/dL (ref 100–199)
HDL Particle Number: 30.5 umol/L (ref >=30.5)
HDL Size: 8.3 nm — ABNORMAL LOW (ref >=9.2)
HDL-C: 45 mg/dL (ref >39)
LDL (calc): 108 mg/dL — ABNORMAL HIGH (ref 0–99)
LDL PARTICLE NUMBER: 1623 nmol/L — AB (ref <1000)
LDL Size: 20.8 nm (ref >=20.8)
LP-IR SCORE: 46 — AB (ref <=45)
Large VLDL-P: <0.8 nmol/L (ref <=2.7)
Small LDL Particle Number: 731 nmol/L — ABNORMAL HIGH (ref <=527)
Triglycerides: 143 mg/dL (ref 0–149)
VLDL SIZE: 41.7 nm (ref <=46.6)

## 2014-11-27 ENCOUNTER — Ambulatory Visit: Payer: Medicare Other | Admitting: Internal Medicine

## 2014-11-27 DIAGNOSIS — Q649 Congenital malformation of urinary system, unspecified: Secondary | ICD-10-CM | POA: Diagnosis not present

## 2014-11-27 DIAGNOSIS — R509 Fever, unspecified: Secondary | ICD-10-CM | POA: Diagnosis not present

## 2014-11-29 ENCOUNTER — Telehealth: Payer: Self-pay | Admitting: *Deleted

## 2014-11-29 DIAGNOSIS — E785 Hyperlipidemia, unspecified: Secondary | ICD-10-CM

## 2014-11-29 MED ORDER — ATORVASTATIN CALCIUM 80 MG PO TABS: 80.0000 mg | ORAL_TABLET | Freq: Every day | ORAL | Status: DC

## 2014-11-29 NOTE — Telephone Encounter (Signed)
LMTCB r/t lab results. Released results to [place]. Medications updated and lab slips mailed to patient.   Spoke with patient. He is agreeable with plans of med increase and re-checking labs  OV 3/31 with Dr. [name] scheduled

## 2014-12-01 ENCOUNTER — Telehealth: Payer: Self-pay | Admitting: Internal Medicine

## 2014-12-01 NOTE — Telephone Encounter (Signed)
Jose Stone is calling because he is going to the dentist and is on blood thinners, want to know what precautions he needs to take , Also calling about Cardiac rehab , if an order has been sent . Please call    Thanks

## 2014-12-01 NOTE — Telephone Encounter (Signed)
Verdis Frederickson called in stating that an order for the pt was faxed over to our office today and wanted to see if Dr. [name] could possibly sign off on it today. Please call  Thanks

## 2014-12-01 NOTE — Telephone Encounter (Signed)
Pt scheduled for cleaning only. Advised OK to do w/o discontinuing meds. Pt voiced understanding.   He also wanted to make sure cardiac rehab orders got sent before next Thursday, I acknowledged this and stated we have received the fax and will re-send before end of business today.

## 2014-12-01 NOTE — Telephone Encounter (Signed)
Noted. Form is on Dr. Lysbeth Penner cart to sign off on

## 2014-12-01 NOTE — Telephone Encounter (Signed)
Is he having dental extractions or cleanings?

## 2014-12-04 NOTE — Telephone Encounter (Signed)
Faxed order for phase 2 cardiac rehab - no GXT required to [facility] - [phone]

## 2014-12-07 ENCOUNTER — Encounter (HOSPITAL_COMMUNITY)
Admission: RE | Admit: 2014-12-07 | Discharge: 2014-12-07 | Disposition: A | Discharge status: Home / Self Care | Payer: Medicare Other | Source: Ambulatory Visit | Attending: Internal Medicine | Admitting: Internal Medicine

## 2014-12-07 ENCOUNTER — Other Ambulatory Visit: Payer: Self-pay | Admitting: *Deleted

## 2014-12-07 DIAGNOSIS — I252 Old myocardial infarction: Secondary | ICD-10-CM | POA: Insufficient documentation

## 2014-12-07 DIAGNOSIS — I10 Essential (primary) hypertension: Secondary | ICD-10-CM | POA: Insufficient documentation

## 2014-12-07 DIAGNOSIS — I4891 Unspecified atrial fibrillation: Secondary | ICD-10-CM | POA: Insufficient documentation

## 2014-12-07 DIAGNOSIS — I2510 Atherosclerotic heart disease of native coronary artery without angina pectoris: Secondary | ICD-10-CM | POA: Insufficient documentation

## 2014-12-07 DIAGNOSIS — Q210 Ventricular septal defect: Secondary | ICD-10-CM | POA: Insufficient documentation

## 2014-12-07 DIAGNOSIS — Z955 Presence of coronary angioplasty implant and graft: Secondary | ICD-10-CM | POA: Insufficient documentation

## 2014-12-07 DIAGNOSIS — Z5189 Encounter for other specified aftercare: Secondary | ICD-10-CM | POA: Insufficient documentation

## 2014-12-07 DIAGNOSIS — E785 Hyperlipidemia, unspecified: Secondary | ICD-10-CM | POA: Insufficient documentation

## 2014-12-07 DIAGNOSIS — I712 Thoracic aortic aneurysm, without rupture: Secondary | ICD-10-CM | POA: Insufficient documentation

## 2014-12-07 NOTE — Progress Notes (Signed)
Pt in today for cardiac rehab orientation.  Pt noted to have an irregularity to his pulse. Pt placed on Zoll which showed SR with pac's.  BP 100/60.   Will fax strips over for review by Dr. [name]. Cherre Huger, BSN

## 2014-12-07 NOTE — Progress Notes (Signed)
Cardiac Rehab Medication Review by a Pharmacist  Does the patient  feel that his/her medications are working for him/her?  yes  Has the patient been experiencing any side effects to the medications prescribed?  no  Does the patient measure his/her own blood pressure or blood glucose at home?  yes - at least once daily  Does the patient have any problems obtaining medications due to transportation or finances?   no  Understanding of regimen: excellent Understanding of indications: good Potential of compliance: excellent    Pharmacist comments:  Very pleasant 69 yo M presenting to cardiac rehab. Patient brought medications with him. We reviewed all medications. All questions were answered. He has an excellent understand of what and why he is taking each medication. He has an alarm set to remind him when to take all of his medications so he never forgets.   Thank you for allowing pharmacy to be part of this patient's care team  Jose Stone M. Lynn Sissel, Pharm.D Clinical Pharmacy Resident Pager: [phone] 12/07/2014 .8:31 AM

## 2014-12-09 ENCOUNTER — Telehealth (HOSPITAL_COMMUNITY): Payer: Self-pay | Admitting: *Deleted

## 2014-12-09 NOTE — Telephone Encounter (Signed)
-----   Message from [name], MD sent at 12/07/2014  4:37 PM EST ----- Regarding: RE: Strips to office for SR with PAC's We will do a GXT here in the office.  Won't be before Wednesday. Why do your forms ask if the patient needs a stress test prior to rehab, then? That is why I always choose "no".  Eliezer Lofts - can you get a symptom limited stress test prior to starting cardiac rehab?  -Dr. Lemmie Evens  ----- Message -----    From: Rowe Pavy, RN    Sent: 12/07/2014   1:44 PM      To: [name], MD Subject: RE: Strips to office for SR with PAC's         Thanks so much for your response. We do not perform stress test here in cardiac rehab.  This are done in the office.  Strips were sent about a hour ago.  Will your office notify patient for scheduling the gxt?  I will contact pt to let him know we need to hold for this to be completed.  He wasn't going to start until Wednesday - don't know if things can happen that quickly.  [place] ----- Message -----    From: [name], MD    Sent: 12/07/2014  12:50 PM      To: Rowe Pavy, RN Subject: RE: Strips to office for SR with PAC's         The idea was to have him get a symptom-limited stress test in rehab - is that possible? I'm always asked to check the box that says GXT or not prior to starting cardiac rehab - I would like him to have one.  I'll review the strips today if you send them.  Dr. Lemmie Evens  ----- Message -----    From: Rowe Pavy, RN    Sent: 12/07/2014  12:30 PM      To: [name], MD Subject: Strips to office for SR with PAC's             FYI Pt in today for orientation s/p 07/27/14 MV replacement.  Pt sounded irregular so we placed him on the monitor. Sr with PAC's last 12 lead on 1/15 showed sb.  Noted in his history afib upon arrival to ED.  Returned to [facility] with diltiazem Pt denies any symptoms bp 100/60.  Will fax strips over for your review.  You mentioned in your office note from 1/14 a  symptom limited stress test prior to cardiac rehab. I can't see in epic where this was completed??  Thanks for your input  [facility] RN

## 2014-12-09 NOTE — Telephone Encounter (Signed)
-----   Message from Fidel Levy, RN sent at 12/08/2014 11:06 AM EST ----- Regarding: cardiac rehab Dr. [name] reviewed rehab strips - no concerns.   Thanks

## 2014-12-09 NOTE — Telephone Encounter (Signed)
Informed pt that I heard back from Dr. [name] on two issues.  One sr with pac is benign no further treatment warranted.  Second will need to hold off on starting Cardiac rehab until GXT (symptom limited) can be performed in the office. Pt will hear from the office at the beginning of the week.  Pt verbalized understanding and is in agreement of this.

## 2014-12-11 ENCOUNTER — Encounter (HOSPITAL_COMMUNITY): Payer: Medicare Other

## 2014-12-13 ENCOUNTER — Encounter (HOSPITAL_COMMUNITY): Payer: Medicare Other

## 2014-12-14 ENCOUNTER — Ambulatory Visit (HOSPITAL_COMMUNITY)
Admission: RE | Admit: 2014-12-14 | Discharge: 2014-12-14 | Disposition: A | Discharge status: Home / Self Care | Payer: Medicare Other | Source: Ambulatory Visit | Attending: Internal Medicine | Admitting: Internal Medicine

## 2014-12-14 ENCOUNTER — Other Ambulatory Visit: Payer: Self-pay

## 2014-12-14 DIAGNOSIS — I2510 Atherosclerotic heart disease of native coronary artery without angina pectoris: Secondary | ICD-10-CM

## 2014-12-14 DIAGNOSIS — R0789 Other chest pain: Secondary | ICD-10-CM | POA: Diagnosis not present

## 2014-12-15 ENCOUNTER — Telehealth (HOSPITAL_COMMUNITY): Payer: Self-pay | Admitting: *Deleted

## 2014-12-15 ENCOUNTER — Encounter (HOSPITAL_COMMUNITY): Payer: Medicare Other

## 2014-12-15 NOTE — Telephone Encounter (Signed)
-----   Message from [name], MD sent at 12/15/2014 10:35 AM EST ----- Regarding: RE: ok to start cardiac rehab Stress test was negative for ischemia - although hypertensive response to exercise. He barely reached at HR of 130 .Marland Kitchen Maybe his target should be lower, like 110-120.  Ok to exercise, though.  Thanks.  Dr. Lemmie Evens  ----- Message -----    From: Rowe Pavy, RN    Sent: 12/15/2014   9:26 AM      To: [name], MD Subject: ok to start cardiac rehab                       Dr. [name]  Pt completed his exercise stress test on yesterday.  Based upon the results ok for pt to proceed?   Thanks  Jose Stone

## 2014-12-18 ENCOUNTER — Encounter (HOSPITAL_COMMUNITY)
Admission: RE | Admit: 2014-12-18 | Discharge: 2014-12-18 | Disposition: A | Discharge status: Home / Self Care | Payer: Medicare Other | Source: Ambulatory Visit | Attending: Internal Medicine | Admitting: Internal Medicine

## 2014-12-18 DIAGNOSIS — I2510 Atherosclerotic heart disease of native coronary artery without angina pectoris: Secondary | ICD-10-CM | POA: Diagnosis not present

## 2014-12-18 DIAGNOSIS — I712 Thoracic aortic aneurysm, without rupture: Secondary | ICD-10-CM | POA: Diagnosis not present

## 2014-12-18 DIAGNOSIS — Z5189 Encounter for other specified aftercare: Secondary | ICD-10-CM | POA: Diagnosis not present

## 2014-12-18 DIAGNOSIS — Q210 Ventricular septal defect: Secondary | ICD-10-CM | POA: Diagnosis not present

## 2014-12-18 DIAGNOSIS — E785 Hyperlipidemia, unspecified: Secondary | ICD-10-CM | POA: Diagnosis not present

## 2014-12-18 DIAGNOSIS — Z955 Presence of coronary angioplasty implant and graft: Secondary | ICD-10-CM | POA: Diagnosis not present

## 2014-12-18 DIAGNOSIS — I10 Essential (primary) hypertension: Secondary | ICD-10-CM | POA: Diagnosis not present

## 2014-12-18 DIAGNOSIS — I4891 Unspecified atrial fibrillation: Secondary | ICD-10-CM | POA: Diagnosis not present

## 2014-12-18 DIAGNOSIS — I252 Old myocardial infarction: Secondary | ICD-10-CM | POA: Diagnosis not present

## 2014-12-18 NOTE — Progress Notes (Signed)
Pt in today for his first day of exercise in the cardiac rehab phase II program.  Pt tolerated light exercise with no complaints.  Monitor shows sr with freq pac and rare PVC.  Strips previously sent to Dr. [name] for review.  Medication list reconciled. Pt denies any barriers to his medications and verbalizes compliance to his regimen.  Psychological Assessment PHQ2 score 0 Pt feels he does not have depression but admits he is slow moving in the morning especially this time of year with shorter days.  Now that winter has progressed he feels this less and less.  This is not new for pt he has always had this as an issue.  Pt also has a diagnosis of ADHD and at one time was on Adderall but this was d/cd due to very low hr with syncopal episodes. Pt has been able to compensate for this as an adult over time.Pt enjoys doing things around the house and is eager to get back to those things such as yard work and some Administrator, arts projects.  Pt feels he has the support of his spouse and family.  Pt demonstrates appropriate and positive coping skills. Pt short term goal is to lose weight.  Pt would like to lose 20-30 pounds for an ideal weight of 210.  Pt weighed 210 back in 1997 when he was started on paxil due to the syncopal episodes he experienced.  Pt now takes Paxil every other day and believes his appetite is calming down.  Advised pt of the nutrition classes offered on Tuesdays and talk with the dietician regarding safe weight loss of 1-2 pounds a week.  Cautioned pt that he may not necessary see the weight loss during his short time with [place] but hopefully this will offer him a good start.  Pt would also like to feel better which he equates to overall good health.  Pt long term goal is to get in better shape.  Pt remains hopefully that attending rehab will get him jump started in exercise and this will also improve his outlook on life - noted that his has low scores on his quality of life survey regarding health and  function.  Will monitor this closely in cardiac rehab. Cherre Huger, BSN

## 2014-12-20 ENCOUNTER — Encounter (HOSPITAL_COMMUNITY)
Admission: RE | Admit: 2014-12-20 | Discharge: 2014-12-20 | Disposition: A | Discharge status: Home / Self Care | Payer: Medicare Other | Source: Ambulatory Visit | Attending: Internal Medicine | Admitting: Internal Medicine

## 2014-12-20 DIAGNOSIS — H04123 Dry eye syndrome of bilateral lacrimal glands: Secondary | ICD-10-CM | POA: Diagnosis not present

## 2014-12-20 DIAGNOSIS — I252 Old myocardial infarction: Secondary | ICD-10-CM | POA: Diagnosis not present

## 2014-12-20 DIAGNOSIS — I2510 Atherosclerotic heart disease of native coronary artery without angina pectoris: Secondary | ICD-10-CM | POA: Diagnosis not present

## 2014-12-20 DIAGNOSIS — I4891 Unspecified atrial fibrillation: Secondary | ICD-10-CM | POA: Diagnosis not present

## 2014-12-20 DIAGNOSIS — I712 Thoracic aortic aneurysm, without rupture: Secondary | ICD-10-CM | POA: Diagnosis not present

## 2014-12-20 DIAGNOSIS — Z955 Presence of coronary angioplasty implant and graft: Secondary | ICD-10-CM | POA: Diagnosis not present

## 2014-12-20 DIAGNOSIS — Z5189 Encounter for other specified aftercare: Secondary | ICD-10-CM | POA: Diagnosis not present

## 2014-12-22 ENCOUNTER — Encounter (HOSPITAL_COMMUNITY)
Admission: RE | Admit: 2014-12-22 | Discharge: 2014-12-22 | Disposition: A | Discharge status: Home / Self Care | Payer: Medicare Other | Source: Ambulatory Visit | Attending: Internal Medicine | Admitting: Internal Medicine

## 2014-12-22 DIAGNOSIS — I4891 Unspecified atrial fibrillation: Secondary | ICD-10-CM | POA: Diagnosis not present

## 2014-12-22 DIAGNOSIS — I2510 Atherosclerotic heart disease of native coronary artery without angina pectoris: Secondary | ICD-10-CM | POA: Diagnosis not present

## 2014-12-22 DIAGNOSIS — Z955 Presence of coronary angioplasty implant and graft: Secondary | ICD-10-CM | POA: Diagnosis not present

## 2014-12-22 DIAGNOSIS — Z5189 Encounter for other specified aftercare: Secondary | ICD-10-CM | POA: Diagnosis not present

## 2014-12-22 DIAGNOSIS — I252 Old myocardial infarction: Secondary | ICD-10-CM | POA: Diagnosis not present

## 2014-12-22 DIAGNOSIS — I712 Thoracic aortic aneurysm, without rupture: Secondary | ICD-10-CM | POA: Diagnosis not present

## 2014-12-25 ENCOUNTER — Encounter (HOSPITAL_COMMUNITY)
Admission: RE | Admit: 2014-12-25 | Discharge: 2014-12-25 | Disposition: A | Discharge status: Home / Self Care | Payer: Medicare Other | Source: Ambulatory Visit | Attending: Internal Medicine | Admitting: Internal Medicine

## 2014-12-25 DIAGNOSIS — Z5189 Encounter for other specified aftercare: Secondary | ICD-10-CM | POA: Diagnosis not present

## 2014-12-25 DIAGNOSIS — I2510 Atherosclerotic heart disease of native coronary artery without angina pectoris: Secondary | ICD-10-CM | POA: Diagnosis not present

## 2014-12-25 DIAGNOSIS — I252 Old myocardial infarction: Secondary | ICD-10-CM | POA: Diagnosis not present

## 2014-12-25 DIAGNOSIS — I4891 Unspecified atrial fibrillation: Secondary | ICD-10-CM | POA: Diagnosis not present

## 2014-12-25 DIAGNOSIS — I712 Thoracic aortic aneurysm, without rupture: Secondary | ICD-10-CM | POA: Diagnosis not present

## 2014-12-25 DIAGNOSIS — Z955 Presence of coronary angioplasty implant and graft: Secondary | ICD-10-CM | POA: Diagnosis not present

## 2014-12-25 NOTE — Progress Notes (Signed)
Reviewed home exercise with pt today.  Pt plans to walk at home and use hand weights for exercise.  Reviewed THR, pulse (needs practice), RPE, sign and symptoms, NTG use, and when to call 911 or MD.  Pt voiced understanding.    During discussion, pt mentioned that he does not have a prescription for nitroglycerin.  RN made aware and note sent to Dr. [name].

## 2014-12-26 DIAGNOSIS — D1801 Hemangioma of skin and subcutaneous tissue: Secondary | ICD-10-CM | POA: Diagnosis not present

## 2014-12-26 DIAGNOSIS — Z85820 Personal history of malignant melanoma of skin: Secondary | ICD-10-CM | POA: Diagnosis not present

## 2014-12-26 DIAGNOSIS — L218 Other seborrheic dermatitis: Secondary | ICD-10-CM | POA: Diagnosis not present

## 2014-12-26 DIAGNOSIS — L858 Other specified epidermal thickening: Secondary | ICD-10-CM | POA: Diagnosis not present

## 2014-12-26 DIAGNOSIS — L821 Other seborrheic keratosis: Secondary | ICD-10-CM | POA: Diagnosis not present

## 2014-12-26 DIAGNOSIS — D225 Melanocytic nevi of trunk: Secondary | ICD-10-CM | POA: Diagnosis not present

## 2014-12-27 ENCOUNTER — Encounter (HOSPITAL_COMMUNITY)
Admission: RE | Admit: 2014-12-27 | Discharge: 2014-12-27 | Disposition: A | Discharge status: Home / Self Care | Payer: Medicare Other | Source: Ambulatory Visit | Attending: Internal Medicine | Admitting: Internal Medicine

## 2014-12-27 DIAGNOSIS — I4891 Unspecified atrial fibrillation: Secondary | ICD-10-CM | POA: Diagnosis not present

## 2014-12-27 DIAGNOSIS — I712 Thoracic aortic aneurysm, without rupture: Secondary | ICD-10-CM | POA: Diagnosis not present

## 2014-12-27 DIAGNOSIS — I252 Old myocardial infarction: Secondary | ICD-10-CM | POA: Diagnosis not present

## 2014-12-27 DIAGNOSIS — Z5189 Encounter for other specified aftercare: Secondary | ICD-10-CM | POA: Diagnosis not present

## 2014-12-27 DIAGNOSIS — I2510 Atherosclerotic heart disease of native coronary artery without angina pectoris: Secondary | ICD-10-CM | POA: Diagnosis not present

## 2014-12-27 DIAGNOSIS — Z955 Presence of coronary angioplasty implant and graft: Secondary | ICD-10-CM | POA: Diagnosis not present

## 2014-12-28 DIAGNOSIS — I2510 Atherosclerotic heart disease of native coronary artery without angina pectoris: Secondary | ICD-10-CM | POA: Diagnosis not present

## 2014-12-28 DIAGNOSIS — I10 Essential (primary) hypertension: Secondary | ICD-10-CM | POA: Diagnosis not present

## 2014-12-28 DIAGNOSIS — E780 Pure hypercholesterolemia: Secondary | ICD-10-CM | POA: Diagnosis not present

## 2014-12-28 DIAGNOSIS — F900 Attention-deficit hyperactivity disorder, predominantly inattentive type: Secondary | ICD-10-CM | POA: Diagnosis not present

## 2014-12-28 DIAGNOSIS — Q549 Hypospadias, unspecified: Secondary | ICD-10-CM | POA: Diagnosis not present

## 2014-12-28 DIAGNOSIS — N390 Urinary tract infection, site not specified: Secondary | ICD-10-CM | POA: Diagnosis not present

## 2014-12-29 ENCOUNTER — Encounter (HOSPITAL_COMMUNITY)
Admission: RE | Admit: 2014-12-29 | Discharge: 2014-12-29 | Disposition: A | Discharge status: Home / Self Care | Payer: Medicare Other | Source: Ambulatory Visit | Attending: Internal Medicine | Admitting: Internal Medicine

## 2014-12-29 DIAGNOSIS — I712 Thoracic aortic aneurysm, without rupture: Secondary | ICD-10-CM | POA: Diagnosis not present

## 2014-12-29 DIAGNOSIS — Z5189 Encounter for other specified aftercare: Secondary | ICD-10-CM | POA: Diagnosis not present

## 2014-12-29 DIAGNOSIS — Z955 Presence of coronary angioplasty implant and graft: Secondary | ICD-10-CM | POA: Diagnosis not present

## 2014-12-29 DIAGNOSIS — I4891 Unspecified atrial fibrillation: Secondary | ICD-10-CM | POA: Diagnosis not present

## 2014-12-29 DIAGNOSIS — I2510 Atherosclerotic heart disease of native coronary artery without angina pectoris: Secondary | ICD-10-CM | POA: Diagnosis not present

## 2014-12-29 DIAGNOSIS — I252 Old myocardial infarction: Secondary | ICD-10-CM | POA: Diagnosis not present

## 2015-01-01 ENCOUNTER — Encounter (HOSPITAL_COMMUNITY)
Admission: RE | Admit: 2015-01-01 | Discharge: 2015-01-01 | Disposition: A | Discharge status: Home / Self Care | Payer: Medicare Other | Source: Ambulatory Visit | Attending: Internal Medicine | Admitting: Internal Medicine

## 2015-01-01 DIAGNOSIS — I4891 Unspecified atrial fibrillation: Secondary | ICD-10-CM | POA: Diagnosis not present

## 2015-01-01 DIAGNOSIS — Z5189 Encounter for other specified aftercare: Secondary | ICD-10-CM | POA: Diagnosis not present

## 2015-01-01 DIAGNOSIS — I2510 Atherosclerotic heart disease of native coronary artery without angina pectoris: Secondary | ICD-10-CM | POA: Diagnosis not present

## 2015-01-01 DIAGNOSIS — I712 Thoracic aortic aneurysm, without rupture: Secondary | ICD-10-CM | POA: Diagnosis not present

## 2015-01-01 DIAGNOSIS — I252 Old myocardial infarction: Secondary | ICD-10-CM | POA: Diagnosis not present

## 2015-01-01 DIAGNOSIS — Z955 Presence of coronary angioplasty implant and graft: Secondary | ICD-10-CM | POA: Diagnosis not present

## 2015-01-03 ENCOUNTER — Other Ambulatory Visit: Payer: Self-pay | Admitting: *Deleted

## 2015-01-03 ENCOUNTER — Encounter (HOSPITAL_COMMUNITY)
Admission: RE | Admit: 2015-01-03 | Discharge: 2015-01-03 | Disposition: A | Discharge status: Home / Self Care | Payer: Medicare Other | Source: Ambulatory Visit | Attending: Internal Medicine | Admitting: Internal Medicine

## 2015-01-03 DIAGNOSIS — Z955 Presence of coronary angioplasty implant and graft: Secondary | ICD-10-CM | POA: Diagnosis not present

## 2015-01-03 DIAGNOSIS — Q210 Ventricular septal defect: Secondary | ICD-10-CM | POA: Insufficient documentation

## 2015-01-03 DIAGNOSIS — E785 Hyperlipidemia, unspecified: Secondary | ICD-10-CM | POA: Insufficient documentation

## 2015-01-03 DIAGNOSIS — Z5189 Encounter for other specified aftercare: Secondary | ICD-10-CM | POA: Insufficient documentation

## 2015-01-03 DIAGNOSIS — I712 Thoracic aortic aneurysm, without rupture: Secondary | ICD-10-CM | POA: Diagnosis not present

## 2015-01-03 DIAGNOSIS — I252 Old myocardial infarction: Secondary | ICD-10-CM | POA: Diagnosis not present

## 2015-01-03 DIAGNOSIS — I4891 Unspecified atrial fibrillation: Secondary | ICD-10-CM | POA: Diagnosis not present

## 2015-01-03 DIAGNOSIS — I10 Essential (primary) hypertension: Secondary | ICD-10-CM | POA: Diagnosis not present

## 2015-01-03 DIAGNOSIS — I2510 Atherosclerotic heart disease of native coronary artery without angina pectoris: Secondary | ICD-10-CM | POA: Diagnosis not present

## 2015-01-03 MED ORDER — NITROGLYCERIN 0.4 MG SL SUBL: 0.4000 mg | SUBLINGUAL_TABLET | SUBLINGUAL | Status: DC | PRN

## 2015-01-05 ENCOUNTER — Encounter (HOSPITAL_COMMUNITY)
Admission: RE | Admit: 2015-01-05 | Discharge: 2015-01-05 | Disposition: A | Discharge status: Home / Self Care | Payer: Medicare Other | Source: Ambulatory Visit | Attending: Internal Medicine | Admitting: Internal Medicine

## 2015-01-05 DIAGNOSIS — Z5189 Encounter for other specified aftercare: Secondary | ICD-10-CM | POA: Diagnosis not present

## 2015-01-05 DIAGNOSIS — I4891 Unspecified atrial fibrillation: Secondary | ICD-10-CM | POA: Diagnosis not present

## 2015-01-05 DIAGNOSIS — I712 Thoracic aortic aneurysm, without rupture: Secondary | ICD-10-CM | POA: Diagnosis not present

## 2015-01-05 DIAGNOSIS — I252 Old myocardial infarction: Secondary | ICD-10-CM | POA: Diagnosis not present

## 2015-01-05 DIAGNOSIS — Z955 Presence of coronary angioplasty implant and graft: Secondary | ICD-10-CM | POA: Diagnosis not present

## 2015-01-05 DIAGNOSIS — I2510 Atherosclerotic heart disease of native coronary artery without angina pectoris: Secondary | ICD-10-CM | POA: Diagnosis not present

## 2015-01-08 ENCOUNTER — Encounter (HOSPITAL_COMMUNITY)
Admission: RE | Admit: 2015-01-08 | Discharge: 2015-01-08 | Disposition: A | Discharge status: Home / Self Care | Payer: Medicare Other | Source: Ambulatory Visit | Attending: Internal Medicine | Admitting: Internal Medicine

## 2015-01-08 DIAGNOSIS — Z5189 Encounter for other specified aftercare: Secondary | ICD-10-CM | POA: Diagnosis not present

## 2015-01-08 DIAGNOSIS — I4891 Unspecified atrial fibrillation: Secondary | ICD-10-CM | POA: Diagnosis not present

## 2015-01-08 DIAGNOSIS — I2510 Atherosclerotic heart disease of native coronary artery without angina pectoris: Secondary | ICD-10-CM | POA: Diagnosis not present

## 2015-01-08 DIAGNOSIS — I712 Thoracic aortic aneurysm, without rupture: Secondary | ICD-10-CM | POA: Diagnosis not present

## 2015-01-08 DIAGNOSIS — Z955 Presence of coronary angioplasty implant and graft: Secondary | ICD-10-CM | POA: Diagnosis not present

## 2015-01-08 DIAGNOSIS — I252 Old myocardial infarction: Secondary | ICD-10-CM | POA: Diagnosis not present

## 2015-01-10 ENCOUNTER — Encounter (HOSPITAL_COMMUNITY)
Admission: RE | Admit: 2015-01-10 | Discharge: 2015-01-10 | Disposition: A | Discharge status: Home / Self Care | Payer: Medicare Other | Source: Ambulatory Visit | Attending: Internal Medicine | Admitting: Internal Medicine

## 2015-01-10 DIAGNOSIS — Z5189 Encounter for other specified aftercare: Secondary | ICD-10-CM | POA: Diagnosis not present

## 2015-01-10 DIAGNOSIS — F411 Generalized anxiety disorder: Secondary | ICD-10-CM | POA: Diagnosis not present

## 2015-01-10 DIAGNOSIS — I4891 Unspecified atrial fibrillation: Secondary | ICD-10-CM | POA: Diagnosis not present

## 2015-01-10 DIAGNOSIS — I712 Thoracic aortic aneurysm, without rupture: Secondary | ICD-10-CM | POA: Diagnosis not present

## 2015-01-10 DIAGNOSIS — Z955 Presence of coronary angioplasty implant and graft: Secondary | ICD-10-CM | POA: Diagnosis not present

## 2015-01-10 DIAGNOSIS — F902 Attention-deficit hyperactivity disorder, combined type: Secondary | ICD-10-CM | POA: Diagnosis not present

## 2015-01-10 DIAGNOSIS — I2510 Atherosclerotic heart disease of native coronary artery without angina pectoris: Secondary | ICD-10-CM | POA: Diagnosis not present

## 2015-01-10 DIAGNOSIS — I252 Old myocardial infarction: Secondary | ICD-10-CM | POA: Diagnosis not present

## 2015-01-10 NOTE — Progress Notes (Signed)
Jose Stone 69 y.o. male Nutrition Note Spoke with pt.  Nutrition Survey reviewed with pt. Pt is following Step 1 of the Therapeutic Lifestyle Changes diet. Pt wants to lose wt. Wt loss tips reviewed.Pt aware of nutrition education classes offered. Pt expressed understanding of the information reviewed.  Nutrition Diagnosis ? Food-and nutrition-related knowledge deficit related to lack of exposure to information as related to diagnosis of: ? CVD  ? Obesity related to excessive energy intake as evidenced by a BMI of 31.7  Nutrition Intervention ? Benefits of adopting Therapeutic Lifestyle Changes discussed when Medficts reviewed. ? Pt to attend the Portion Distortion class ? Pt to attend the  ? Nutrition I class - met; 01/02/15                    ? Nutrition II class - met; 01/09/15 ? Pt given handouts for: ? 1800 kcal, 5-day menu ideas ? Continue client-centered nutrition education by RD, as part of interdisciplinary care.  Goal(s) ? Pt to eat a variety of non-starchy vegetables. ? Pt to eat more fruit. (Currently consumes 2 servings daily). ? Pt to go easy on cheeses.( Pt currently consumes 4 cheese sticks daily). ? Pt to identify and limit food sources of saturated fat, trans fat, and cholesterol ? Pt to identify food quantities necessary to achieve: ? wt loss to a goal wt of 219-237 lb (99.6-107.8 kg) at graduation from cardiac rehab.   Monitor and Evaluate progress toward nutrition goal with team.   Derek Mound, M.Ed, RD, LDN, CDE 01/10/2015 11:53 AM

## 2015-01-12 ENCOUNTER — Encounter (HOSPITAL_COMMUNITY)
Admission: RE | Admit: 2015-01-12 | Discharge: 2015-01-12 | Disposition: A | Discharge status: Home / Self Care | Payer: Medicare Other | Source: Ambulatory Visit | Attending: Internal Medicine | Admitting: Internal Medicine

## 2015-01-12 DIAGNOSIS — I2510 Atherosclerotic heart disease of native coronary artery without angina pectoris: Secondary | ICD-10-CM | POA: Diagnosis not present

## 2015-01-12 DIAGNOSIS — I252 Old myocardial infarction: Secondary | ICD-10-CM | POA: Diagnosis not present

## 2015-01-12 DIAGNOSIS — Z5189 Encounter for other specified aftercare: Secondary | ICD-10-CM | POA: Diagnosis not present

## 2015-01-12 DIAGNOSIS — Z955 Presence of coronary angioplasty implant and graft: Secondary | ICD-10-CM | POA: Diagnosis not present

## 2015-01-12 DIAGNOSIS — I4891 Unspecified atrial fibrillation: Secondary | ICD-10-CM | POA: Diagnosis not present

## 2015-01-12 DIAGNOSIS — I712 Thoracic aortic aneurysm, without rupture: Secondary | ICD-10-CM | POA: Diagnosis not present

## 2015-01-15 ENCOUNTER — Encounter (HOSPITAL_COMMUNITY)
Admission: RE | Admit: 2015-01-15 | Discharge: 2015-01-15 | Disposition: A | Discharge status: Home / Self Care | Payer: Medicare Other | Source: Ambulatory Visit | Attending: Internal Medicine | Admitting: Internal Medicine

## 2015-01-15 DIAGNOSIS — Z5189 Encounter for other specified aftercare: Secondary | ICD-10-CM | POA: Diagnosis not present

## 2015-01-15 DIAGNOSIS — I712 Thoracic aortic aneurysm, without rupture: Secondary | ICD-10-CM | POA: Diagnosis not present

## 2015-01-15 DIAGNOSIS — I2510 Atherosclerotic heart disease of native coronary artery without angina pectoris: Secondary | ICD-10-CM | POA: Diagnosis not present

## 2015-01-15 DIAGNOSIS — Z955 Presence of coronary angioplasty implant and graft: Secondary | ICD-10-CM | POA: Diagnosis not present

## 2015-01-15 DIAGNOSIS — I252 Old myocardial infarction: Secondary | ICD-10-CM | POA: Diagnosis not present

## 2015-01-15 DIAGNOSIS — I4891 Unspecified atrial fibrillation: Secondary | ICD-10-CM | POA: Diagnosis not present

## 2015-01-17 ENCOUNTER — Encounter (HOSPITAL_COMMUNITY)
Admission: RE | Admit: 2015-01-17 | Discharge: 2015-01-17 | Disposition: A | Discharge status: Home / Self Care | Payer: Medicare Other | Source: Ambulatory Visit | Attending: Internal Medicine | Admitting: Internal Medicine

## 2015-01-17 DIAGNOSIS — I2510 Atherosclerotic heart disease of native coronary artery without angina pectoris: Secondary | ICD-10-CM | POA: Diagnosis not present

## 2015-01-17 DIAGNOSIS — Z5189 Encounter for other specified aftercare: Secondary | ICD-10-CM | POA: Diagnosis not present

## 2015-01-17 DIAGNOSIS — I712 Thoracic aortic aneurysm, without rupture: Secondary | ICD-10-CM | POA: Diagnosis not present

## 2015-01-17 DIAGNOSIS — I4891 Unspecified atrial fibrillation: Secondary | ICD-10-CM | POA: Diagnosis not present

## 2015-01-17 DIAGNOSIS — Z955 Presence of coronary angioplasty implant and graft: Secondary | ICD-10-CM | POA: Diagnosis not present

## 2015-01-17 DIAGNOSIS — I252 Old myocardial infarction: Secondary | ICD-10-CM | POA: Diagnosis not present

## 2015-01-19 ENCOUNTER — Encounter (HOSPITAL_COMMUNITY)
Admission: RE | Admit: 2015-01-19 | Discharge: 2015-01-19 | Disposition: A | Discharge status: Home / Self Care | Payer: Medicare Other | Source: Ambulatory Visit | Attending: Internal Medicine | Admitting: Internal Medicine

## 2015-01-19 DIAGNOSIS — I712 Thoracic aortic aneurysm, without rupture: Secondary | ICD-10-CM | POA: Diagnosis not present

## 2015-01-19 DIAGNOSIS — Z5189 Encounter for other specified aftercare: Secondary | ICD-10-CM | POA: Diagnosis not present

## 2015-01-19 DIAGNOSIS — Z955 Presence of coronary angioplasty implant and graft: Secondary | ICD-10-CM | POA: Diagnosis not present

## 2015-01-19 DIAGNOSIS — I4891 Unspecified atrial fibrillation: Secondary | ICD-10-CM | POA: Diagnosis not present

## 2015-01-19 DIAGNOSIS — I2510 Atherosclerotic heart disease of native coronary artery without angina pectoris: Secondary | ICD-10-CM | POA: Diagnosis not present

## 2015-01-19 DIAGNOSIS — I252 Old myocardial infarction: Secondary | ICD-10-CM | POA: Diagnosis not present

## 2015-01-22 ENCOUNTER — Encounter (HOSPITAL_COMMUNITY)
Admission: RE | Admit: 2015-01-22 | Discharge: 2015-01-22 | Disposition: A | Discharge status: Home / Self Care | Payer: Medicare Other | Source: Ambulatory Visit | Attending: Internal Medicine | Admitting: Internal Medicine

## 2015-01-22 DIAGNOSIS — I252 Old myocardial infarction: Secondary | ICD-10-CM | POA: Diagnosis not present

## 2015-01-22 DIAGNOSIS — I2510 Atherosclerotic heart disease of native coronary artery without angina pectoris: Secondary | ICD-10-CM | POA: Diagnosis not present

## 2015-01-22 DIAGNOSIS — I712 Thoracic aortic aneurysm, without rupture: Secondary | ICD-10-CM | POA: Diagnosis not present

## 2015-01-22 DIAGNOSIS — E785 Hyperlipidemia, unspecified: Secondary | ICD-10-CM | POA: Diagnosis not present

## 2015-01-22 DIAGNOSIS — Z5189 Encounter for other specified aftercare: Secondary | ICD-10-CM | POA: Diagnosis not present

## 2015-01-22 DIAGNOSIS — I4891 Unspecified atrial fibrillation: Secondary | ICD-10-CM | POA: Diagnosis not present

## 2015-01-22 DIAGNOSIS — Z955 Presence of coronary angioplasty implant and graft: Secondary | ICD-10-CM | POA: Diagnosis not present

## 2015-01-24 ENCOUNTER — Encounter (HOSPITAL_COMMUNITY)
Admission: RE | Admit: 2015-01-24 | Discharge: 2015-01-24 | Disposition: A | Discharge status: Home / Self Care | Payer: Medicare Other | Source: Ambulatory Visit | Attending: Internal Medicine | Admitting: Internal Medicine

## 2015-01-24 ENCOUNTER — Encounter: Payer: Self-pay | Admitting: *Deleted

## 2015-01-24 DIAGNOSIS — Z5189 Encounter for other specified aftercare: Secondary | ICD-10-CM | POA: Diagnosis not present

## 2015-01-24 DIAGNOSIS — I4891 Unspecified atrial fibrillation: Secondary | ICD-10-CM | POA: Diagnosis not present

## 2015-01-24 DIAGNOSIS — Z955 Presence of coronary angioplasty implant and graft: Secondary | ICD-10-CM | POA: Diagnosis not present

## 2015-01-24 DIAGNOSIS — I712 Thoracic aortic aneurysm, without rupture: Secondary | ICD-10-CM | POA: Diagnosis not present

## 2015-01-24 DIAGNOSIS — I252 Old myocardial infarction: Secondary | ICD-10-CM | POA: Diagnosis not present

## 2015-01-24 DIAGNOSIS — I2510 Atherosclerotic heart disease of native coronary artery without angina pectoris: Secondary | ICD-10-CM | POA: Diagnosis not present

## 2015-01-24 LAB — NMR LIPOPROFILE WITH LIPIDS
CHOLESTEROL, TOTAL: 131 mg/dL (ref 100–199)
HDL PARTICLE NUMBER: 27.5 umol/L — AB (ref >=30.5)
HDL SIZE: 8.5 nm — AB (ref >=9.2)
HDL-C: 49 mg/dL (ref >39)
LARGE VLDL-P: 1.5 nmol/L (ref <=2.7)
LDL (calc): 63 mg/dL (ref 0–99)
LDL Particle Number: 1095 nmol/L — ABNORMAL HIGH (ref <1000)
LDL Size: 20.2 nm (ref >=20.8)
LP-IR SCORE: 54 — AB (ref <=45)
Large HDL-P: 3 umol/L — ABNORMAL LOW (ref >=4.8)
Small LDL Particle Number: 583 nmol/L — ABNORMAL HIGH (ref <=527)
Triglycerides: 93 mg/dL (ref 0–149)
VLDL Size: 41.8 nm (ref <=46.6)

## 2015-01-26 ENCOUNTER — Telehealth: Payer: Self-pay | Admitting: Internal Medicine

## 2015-01-26 ENCOUNTER — Encounter (HOSPITAL_COMMUNITY)
Admission: RE | Admit: 2015-01-26 | Discharge: 2015-01-26 | Disposition: A | Discharge status: Home / Self Care | Payer: Medicare Other | Source: Ambulatory Visit | Attending: Internal Medicine | Admitting: Internal Medicine

## 2015-01-26 DIAGNOSIS — I4891 Unspecified atrial fibrillation: Secondary | ICD-10-CM | POA: Diagnosis not present

## 2015-01-26 DIAGNOSIS — Z5189 Encounter for other specified aftercare: Secondary | ICD-10-CM | POA: Diagnosis not present

## 2015-01-26 DIAGNOSIS — I252 Old myocardial infarction: Secondary | ICD-10-CM | POA: Diagnosis not present

## 2015-01-26 DIAGNOSIS — I712 Thoracic aortic aneurysm, without rupture: Secondary | ICD-10-CM | POA: Diagnosis not present

## 2015-01-26 DIAGNOSIS — I2510 Atherosclerotic heart disease of native coronary artery without angina pectoris: Secondary | ICD-10-CM | POA: Diagnosis not present

## 2015-01-26 DIAGNOSIS — Z955 Presence of coronary angioplasty implant and graft: Secondary | ICD-10-CM | POA: Diagnosis not present

## 2015-01-26 NOTE — Progress Notes (Signed)
Intermittent exertional blood pressure elevations noted at cardiac rehab. Resting blood pressures have been within normal limits. Resting heart rat in the low 50's  With frequent PAC's this is not new. Will fax exercise flow sheets to Dr. Lysbeth Penner office for review. Jose Stone continues to do well with exercise at cardiac rehab.

## 2015-01-26 NOTE — Telephone Encounter (Signed)
Jose Stone called in wanting Dr.Hilty to check the pt's BP levels when he gets back in to the office. She says that he had some exertional  BP elevations. Please f/u  Thanks

## 2015-01-26 NOTE — Telephone Encounter (Signed)
Jose Stone informed pt BP syst in 160s, he also had sinus [place]ud today 49-51 range. (Has been in 46s in the past)  No symptoms w/ this. Pt was sent home. He has OV w/ Dr. [name] next week.  Jose Stone had called to let me know she was sending strips & BP recordings over.   No urgent concerns - will route to Dr. [name] & Eliezer Lofts [facility].

## 2015-01-29 ENCOUNTER — Encounter (HOSPITAL_COMMUNITY)
Admission: RE | Admit: 2015-01-29 | Discharge: 2015-01-29 | Disposition: A | Discharge status: Home / Self Care | Payer: Medicare Other | Source: Ambulatory Visit | Attending: Internal Medicine | Admitting: Internal Medicine

## 2015-01-29 DIAGNOSIS — Z955 Presence of coronary angioplasty implant and graft: Secondary | ICD-10-CM | POA: Diagnosis not present

## 2015-01-29 DIAGNOSIS — I2510 Atherosclerotic heart disease of native coronary artery without angina pectoris: Secondary | ICD-10-CM | POA: Diagnosis not present

## 2015-01-29 DIAGNOSIS — I712 Thoracic aortic aneurysm, without rupture: Secondary | ICD-10-CM | POA: Diagnosis not present

## 2015-01-29 DIAGNOSIS — I4891 Unspecified atrial fibrillation: Secondary | ICD-10-CM | POA: Diagnosis not present

## 2015-01-29 DIAGNOSIS — Z5189 Encounter for other specified aftercare: Secondary | ICD-10-CM | POA: Diagnosis not present

## 2015-01-29 DIAGNOSIS — I252 Old myocardial infarction: Secondary | ICD-10-CM | POA: Diagnosis not present

## 2015-01-31 ENCOUNTER — Encounter (HOSPITAL_COMMUNITY)
Admission: RE | Admit: 2015-01-31 | Discharge: 2015-01-31 | Disposition: A | Discharge status: Home / Self Care | Payer: Medicare Other | Source: Ambulatory Visit | Attending: Internal Medicine | Admitting: Internal Medicine

## 2015-01-31 DIAGNOSIS — Z955 Presence of coronary angioplasty implant and graft: Secondary | ICD-10-CM | POA: Diagnosis not present

## 2015-01-31 DIAGNOSIS — I2510 Atherosclerotic heart disease of native coronary artery without angina pectoris: Secondary | ICD-10-CM | POA: Diagnosis not present

## 2015-01-31 DIAGNOSIS — I712 Thoracic aortic aneurysm, without rupture: Secondary | ICD-10-CM | POA: Diagnosis not present

## 2015-01-31 DIAGNOSIS — I4891 Unspecified atrial fibrillation: Secondary | ICD-10-CM | POA: Diagnosis not present

## 2015-01-31 DIAGNOSIS — Z5189 Encounter for other specified aftercare: Secondary | ICD-10-CM | POA: Diagnosis not present

## 2015-01-31 DIAGNOSIS — I252 Old myocardial infarction: Secondary | ICD-10-CM | POA: Diagnosis not present

## 2015-02-01 ENCOUNTER — Encounter: Payer: Self-pay | Admitting: Internal Medicine

## 2015-02-01 ENCOUNTER — Ambulatory Visit (INDEPENDENT_AMBULATORY_CARE_PROVIDER_SITE_OTHER): Payer: Medicare Other | Admitting: Internal Medicine

## 2015-02-01 VITALS — BP 138/80 | HR 66 | Ht 74.0 in | Wt 242.9 lb

## 2015-02-01 DIAGNOSIS — I4891 Unspecified atrial fibrillation: Secondary | ICD-10-CM | POA: Diagnosis not present

## 2015-02-01 DIAGNOSIS — Q210 Ventricular septal defect: Secondary | ICD-10-CM

## 2015-02-01 DIAGNOSIS — I2583 Coronary atherosclerosis due to lipid rich plaque: Secondary | ICD-10-CM

## 2015-02-01 DIAGNOSIS — E785 Hyperlipidemia, unspecified: Secondary | ICD-10-CM

## 2015-02-01 DIAGNOSIS — I2510 Atherosclerotic heart disease of native coronary artery without angina pectoris: Secondary | ICD-10-CM | POA: Diagnosis not present

## 2015-02-01 DIAGNOSIS — I10 Essential (primary) hypertension: Secondary | ICD-10-CM | POA: Diagnosis not present

## 2015-02-01 NOTE — Patient Instructions (Signed)
Your physician wants you to follow-up in: 6 months with Dr. Hilty. You will receive a reminder letter in the mail two months in advance. If you don't receive a letter, please call our office to schedule the follow-up appointment.    

## 2015-02-01 NOTE — Progress Notes (Signed)
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 This is never caused issues in fact she's had right and left heart catheterizations dating back to the 1970s. 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 Other coronary risk factors include hypertension, dyslipidemia and family history of heart disease. He was sent for cardiac catheterization in 2006 for follow-up of VSD, at which time he had an echocardiogram which showed mild aortic regurgitation, a small membranous VSD and left atrial enlargement with an EF of 70%. He had a stress test at that time which showed findings concerning for him for a septal and inferoapical ischemia. He therefore was referred to left heart catheterization with strep demonstrated a 50-70% mid LAD stenosis and mild disease of the mid circumflex and first obtuse marginal branches. He's been managed medically since that time and previously saw Dr. Daneen Schick.  Recently he's been having some symptoms of sweating, palpitations and dizziness and eventually was found to be in atrial fibrillation when he presented to the hospital. At that time his troponin was elevated. He is placed on diltiazem which slowed his heart rate and he converted back to sinus spontaneously. Based on his elevated troponin, he was referred for left heart catheterization by Dr. Doylene Canard. This demonstrated a 60-70% mid LAD stenosis and a 90% obtuse marginal stenosis. Subsequently he underwent coronary intervention with placement of a 2.5 x 15 mm Xience Alpine stent to the mid OM. After this he became asymptomatic. He was going to have stress testing to determine the significance of his mid LAD lesion however it appears to be not significantly changed  since his heart catheterization 2006. He reports no further symptoms of atrial fibrillation but is concerned about bradycardia. He was not referred for cardiac rehabilitation due to the thought that he may need additional intervention to the LAD.  [name] returns today for follow-up. She's done quite well in cardiac rehabilitation. At his last office visit we discontinued his diltiazem for bradycardia. He still has a low heart rate which has some degree of chronotropic incompetence. He reports no recurrent atrial fibrillation that were aware of. He continues on dual antiplatelet therapy. He had a stress test prior to enrolling in cardiac redilatation which was negative for ischemia.  PMHx:  Past Medical History  Diagnosis Date  . Hypertension   . Hyperlipidemia     VSD  . GERD (gastroesophageal reflux disease)   . CAD (coronary artery disease)     Past Surgical History  Procedure Laterality Date  . Cardiac catheterization    . Left heart catheterization with coronary angiogram N/A 08/14/2014    Procedure: LEFT HEART CATHETERIZATION WITH CORONARY ANGIOGRAM;  Surgeon: Birdie Riddle, MD;  Location: [facility];  Service: Cardiovascular;  Laterality: N/A;  . Tonsillectomy  1955  . Shoulder surgery      FAMHx:  Family History  Problem Relation Age of Onset  . Pancreatic cancer Mother   . Aortic aneurysm Maternal Grandmother   . Heart attack Maternal Grandfather   . Diabetes Paternal Grandfather   . CAD Paternal Grandfather     SOCHx:   reports that he has never smoked. He has never used smokeless tobacco. He reports that he  does not drink alcohol or use illicit drugs.  ALLERGIES:  Allergies  Allergen Reactions  . Neosporin [Neomycin-Bacitracin Zn-Polymyx] Rash  . Percocet [Oxycodone-Acetaminophen] Rash  . Vicodin [Hydrocodone-Acetaminophen] Rash    ROS: A comprehensive review of systems was negative except for: Constitutional: positive for fatigue Respiratory: positive for  dyspnea on exertion Behavioral/Psych: positive for ADHD  HOME MEDS: Current Outpatient Prescriptions  Medication Sig Dispense Refill  . aspirin 81 MG tablet Take 81 mg by mouth daily.    Marland Kitchen atorvastatin (LIPITOR) 80 MG tablet Take 1 tablet (80 mg total) by mouth daily at 6 PM. 30 tablet 6  . b complex vitamins capsule Take 1 capsule by mouth daily.    . cetirizine (ZYRTEC) 10 MG tablet Take 10 mg by mouth daily as needed for allergies.    . cholecalciferol (VITAMIN D) 1000 UNITS tablet Take 2,000 Units by mouth daily.    Marland Kitchen COLCRYS 0.6 MG tablet Take 0.6 mg by mouth daily.     . folic acid (FOLVITE) 867 MCG tablet Take 800 mcg by mouth 2 (two) times daily.    . hydroxypropyl methylcellulose / hypromellose (ISOPTO TEARS / GONIOVISC) 2.5 % ophthalmic solution Place 1 drop into both eyes 4 (four) times daily.    Marland Kitchen losartan (COZAAR) 100 MG tablet Take 100 mg by mouth daily.     . metoprolol tartrate (LOPRESSOR) 25 MG tablet Take 1 tablet (25 mg total) by mouth 2 (two) times daily. 60 tablet 3  . nitroGLYCERIN (NITROSTAT) 0.4 MG SL tablet Place 1 tablet (0.4 mg total) under the tongue every 5 (five) minutes as needed for chest pain. Max 3 doses. 25 tablet 3  . Omega-3 Fat Ac-Cholecalciferol (DRY EYE OMEGA BENEFITS/VIT D-3 PO) Take 4 capsules by mouth daily. Patient takes 4 daily, spreads them out throughout the day    . omeprazole (PRILOSEC) 20 MG capsule Take 20 mg by mouth daily.    Marland Kitchen PARoxetine (PAXIL) 20 MG tablet Take 10 mg by mouth every other day.     Marland Kitchen PATADAY 0.2 % SOLN Place 1 drop into both eyes every evening.     . ticagrelor (BRILINTA) 90 MG TABS tablet Take 1 tablet (90 mg total) by mouth 2 (two) times daily. 60 tablet 6   No current facility-administered medications for this visit.    LABS/IMAGING: No results found for this or any previous visit (from the past 48 hour(s)). No results found.  VITALS: BP 138/80 mmHg  Pulse 66  Ht 6\' 2"  (1.88 m)  Wt 242 lb 14.4 oz (110.179 kg)   BMI 31.17 kg/m2  EXAM: General appearance: alert and no distress Neck: no carotid bruit and no JVD Lungs: clear to auscultation bilaterally Heart: regular rate and rhythm, S1, S2 normal, systolic murmur: systolic ejection 3/6, blowing throughout the precordium and diastolic murmur: early diastolic 2/6, crescendo at apex Abdomen: soft, non-tender; bowel sounds normal; no masses,  no organomegaly Extremities: extremities normal, atraumatic, no cyanosis or edema Pulses: 2+ and symmetric Skin: mild acrocyanosis Neurologic: Grossly normal Psych: Pleasant, somewhat inattentive  EKG: Deferred  ASSESSMENT: 1. Coronary artery disease status post PCI to the OM with a Xience Alpine DES (2.515 mm) 2. Residual moderate to severe mid LAD disease 3. Hypertension 4. Dyslipidemia 5. A. fib with RVR-spontaneously converted, possibly related to ischemia (not on triple anticoagulation) 6. Small perimembranous VSD  PLAN: 1.   Mr. Murri is doing well with cardiac rehabilitation. His stress test in February was negative. We discontinued his calcium  channel blocker and heart rate remains low on beta blocker. He seems to have good exercise tolerance therefore will keep his medicines the same.At some point we will be able to get him off of Brilinta. Recent labs indicated a marked improvement in his cholesterol on Lipitor 80. He seems to be tolerating it without any side effects. LDL particle numbers 1095, TC 131, LDL-C is 63, HDL 49 and triglycerides 93. This is excellent control and should reduce his risk of further cardiac events.  Plan to see him back in about 6 months.   [name], MD, [facility] Attending Cardiologist [facility]  Kyrin Gratz C 02/01/2015, 1:30 PM

## 2015-02-02 ENCOUNTER — Encounter (HOSPITAL_COMMUNITY)
Admission: RE | Admit: 2015-02-02 | Discharge: 2015-02-02 | Disposition: A | Discharge status: Home / Self Care | Payer: Medicare Other | Source: Ambulatory Visit | Attending: Internal Medicine | Admitting: Internal Medicine

## 2015-02-02 DIAGNOSIS — Z5189 Encounter for other specified aftercare: Secondary | ICD-10-CM | POA: Insufficient documentation

## 2015-02-02 DIAGNOSIS — I4891 Unspecified atrial fibrillation: Secondary | ICD-10-CM | POA: Insufficient documentation

## 2015-02-02 DIAGNOSIS — I10 Essential (primary) hypertension: Secondary | ICD-10-CM | POA: Insufficient documentation

## 2015-02-02 DIAGNOSIS — Z955 Presence of coronary angioplasty implant and graft: Secondary | ICD-10-CM | POA: Diagnosis not present

## 2015-02-02 DIAGNOSIS — I252 Old myocardial infarction: Secondary | ICD-10-CM | POA: Insufficient documentation

## 2015-02-02 DIAGNOSIS — I2510 Atherosclerotic heart disease of native coronary artery without angina pectoris: Secondary | ICD-10-CM | POA: Diagnosis not present

## 2015-02-02 DIAGNOSIS — E785 Hyperlipidemia, unspecified: Secondary | ICD-10-CM | POA: Insufficient documentation

## 2015-02-02 DIAGNOSIS — I712 Thoracic aortic aneurysm, without rupture: Secondary | ICD-10-CM | POA: Diagnosis not present

## 2015-02-02 DIAGNOSIS — Q210 Ventricular septal defect: Secondary | ICD-10-CM | POA: Diagnosis not present

## 2015-02-05 ENCOUNTER — Encounter (HOSPITAL_COMMUNITY)
Admission: RE | Admit: 2015-02-05 | Discharge: 2015-02-05 | Disposition: A | Discharge status: Home / Self Care | Payer: Medicare Other | Source: Ambulatory Visit | Attending: Internal Medicine | Admitting: Internal Medicine

## 2015-02-05 DIAGNOSIS — Z5189 Encounter for other specified aftercare: Secondary | ICD-10-CM | POA: Diagnosis not present

## 2015-02-05 DIAGNOSIS — I4891 Unspecified atrial fibrillation: Secondary | ICD-10-CM | POA: Diagnosis not present

## 2015-02-05 DIAGNOSIS — Z955 Presence of coronary angioplasty implant and graft: Secondary | ICD-10-CM | POA: Diagnosis not present

## 2015-02-05 DIAGNOSIS — I2510 Atherosclerotic heart disease of native coronary artery without angina pectoris: Secondary | ICD-10-CM | POA: Diagnosis not present

## 2015-02-05 DIAGNOSIS — I252 Old myocardial infarction: Secondary | ICD-10-CM | POA: Diagnosis not present

## 2015-02-05 DIAGNOSIS — I712 Thoracic aortic aneurysm, without rupture: Secondary | ICD-10-CM | POA: Diagnosis not present

## 2015-02-07 ENCOUNTER — Encounter (HOSPITAL_COMMUNITY)
Admission: RE | Admit: 2015-02-07 | Discharge: 2015-02-07 | Disposition: A | Discharge status: Home / Self Care | Payer: Medicare Other | Source: Ambulatory Visit | Attending: Internal Medicine | Admitting: Internal Medicine

## 2015-02-07 DIAGNOSIS — I2510 Atherosclerotic heart disease of native coronary artery without angina pectoris: Secondary | ICD-10-CM | POA: Diagnosis not present

## 2015-02-07 DIAGNOSIS — I712 Thoracic aortic aneurysm, without rupture: Secondary | ICD-10-CM | POA: Diagnosis not present

## 2015-02-07 DIAGNOSIS — I4891 Unspecified atrial fibrillation: Secondary | ICD-10-CM | POA: Diagnosis not present

## 2015-02-07 DIAGNOSIS — I252 Old myocardial infarction: Secondary | ICD-10-CM | POA: Diagnosis not present

## 2015-02-07 DIAGNOSIS — Z955 Presence of coronary angioplasty implant and graft: Secondary | ICD-10-CM | POA: Diagnosis not present

## 2015-02-07 DIAGNOSIS — Z5189 Encounter for other specified aftercare: Secondary | ICD-10-CM | POA: Diagnosis not present

## 2015-02-09 ENCOUNTER — Encounter (HOSPITAL_COMMUNITY)
Admission: RE | Admit: 2015-02-09 | Discharge: 2015-02-09 | Disposition: A | Discharge status: Home / Self Care | Payer: Medicare Other | Source: Ambulatory Visit | Attending: Internal Medicine | Admitting: Internal Medicine

## 2015-02-09 DIAGNOSIS — I712 Thoracic aortic aneurysm, without rupture: Secondary | ICD-10-CM | POA: Diagnosis not present

## 2015-02-09 DIAGNOSIS — Z955 Presence of coronary angioplasty implant and graft: Secondary | ICD-10-CM | POA: Diagnosis not present

## 2015-02-09 DIAGNOSIS — I2510 Atherosclerotic heart disease of native coronary artery without angina pectoris: Secondary | ICD-10-CM | POA: Diagnosis not present

## 2015-02-09 DIAGNOSIS — Z5189 Encounter for other specified aftercare: Secondary | ICD-10-CM | POA: Diagnosis not present

## 2015-02-09 DIAGNOSIS — I4891 Unspecified atrial fibrillation: Secondary | ICD-10-CM | POA: Diagnosis not present

## 2015-02-09 DIAGNOSIS — I252 Old myocardial infarction: Secondary | ICD-10-CM | POA: Diagnosis not present

## 2015-02-12 ENCOUNTER — Encounter (HOSPITAL_COMMUNITY)
Admission: RE | Admit: 2015-02-12 | Discharge: 2015-02-12 | Disposition: A | Discharge status: Home / Self Care | Payer: Medicare Other | Source: Ambulatory Visit | Attending: Internal Medicine | Admitting: Internal Medicine

## 2015-02-12 DIAGNOSIS — Z955 Presence of coronary angioplasty implant and graft: Secondary | ICD-10-CM | POA: Diagnosis not present

## 2015-02-12 DIAGNOSIS — Z5189 Encounter for other specified aftercare: Secondary | ICD-10-CM | POA: Diagnosis not present

## 2015-02-12 DIAGNOSIS — I252 Old myocardial infarction: Secondary | ICD-10-CM | POA: Diagnosis not present

## 2015-02-12 DIAGNOSIS — I712 Thoracic aortic aneurysm, without rupture: Secondary | ICD-10-CM | POA: Diagnosis not present

## 2015-02-12 DIAGNOSIS — I2510 Atherosclerotic heart disease of native coronary artery without angina pectoris: Secondary | ICD-10-CM | POA: Diagnosis not present

## 2015-02-12 DIAGNOSIS — I4891 Unspecified atrial fibrillation: Secondary | ICD-10-CM | POA: Diagnosis not present

## 2015-02-14 ENCOUNTER — Encounter (HOSPITAL_COMMUNITY)
Admission: RE | Admit: 2015-02-14 | Discharge: 2015-02-14 | Disposition: A | Discharge status: Home / Self Care | Payer: Medicare Other | Source: Ambulatory Visit | Attending: Internal Medicine | Admitting: Internal Medicine

## 2015-02-14 DIAGNOSIS — I4891 Unspecified atrial fibrillation: Secondary | ICD-10-CM | POA: Diagnosis not present

## 2015-02-14 DIAGNOSIS — Z5189 Encounter for other specified aftercare: Secondary | ICD-10-CM | POA: Diagnosis not present

## 2015-02-14 DIAGNOSIS — I712 Thoracic aortic aneurysm, without rupture: Secondary | ICD-10-CM | POA: Diagnosis not present

## 2015-02-14 DIAGNOSIS — I252 Old myocardial infarction: Secondary | ICD-10-CM | POA: Diagnosis not present

## 2015-02-14 DIAGNOSIS — Z955 Presence of coronary angioplasty implant and graft: Secondary | ICD-10-CM | POA: Diagnosis not present

## 2015-02-14 DIAGNOSIS — I2510 Atherosclerotic heart disease of native coronary artery without angina pectoris: Secondary | ICD-10-CM | POA: Diagnosis not present

## 2015-02-16 ENCOUNTER — Encounter (HOSPITAL_COMMUNITY)
Admission: RE | Admit: 2015-02-16 | Discharge: 2015-02-16 | Disposition: A | Discharge status: Home / Self Care | Payer: Medicare Other | Source: Ambulatory Visit | Attending: Internal Medicine | Admitting: Internal Medicine

## 2015-02-16 DIAGNOSIS — I4891 Unspecified atrial fibrillation: Secondary | ICD-10-CM | POA: Diagnosis not present

## 2015-02-16 DIAGNOSIS — I712 Thoracic aortic aneurysm, without rupture: Secondary | ICD-10-CM | POA: Diagnosis not present

## 2015-02-16 DIAGNOSIS — Z5189 Encounter for other specified aftercare: Secondary | ICD-10-CM | POA: Diagnosis not present

## 2015-02-16 DIAGNOSIS — Z955 Presence of coronary angioplasty implant and graft: Secondary | ICD-10-CM | POA: Diagnosis not present

## 2015-02-16 DIAGNOSIS — I2510 Atherosclerotic heart disease of native coronary artery without angina pectoris: Secondary | ICD-10-CM | POA: Diagnosis not present

## 2015-02-16 DIAGNOSIS — I252 Old myocardial infarction: Secondary | ICD-10-CM | POA: Diagnosis not present

## 2015-02-19 ENCOUNTER — Encounter (HOSPITAL_COMMUNITY)
Admission: RE | Admit: 2015-02-19 | Discharge: 2015-02-19 | Disposition: A | Discharge status: Home / Self Care | Payer: Medicare Other | Source: Ambulatory Visit | Attending: Internal Medicine | Admitting: Internal Medicine

## 2015-02-19 DIAGNOSIS — I2510 Atherosclerotic heart disease of native coronary artery without angina pectoris: Secondary | ICD-10-CM | POA: Diagnosis not present

## 2015-02-19 DIAGNOSIS — Z955 Presence of coronary angioplasty implant and graft: Secondary | ICD-10-CM | POA: Diagnosis not present

## 2015-02-19 DIAGNOSIS — Z5189 Encounter for other specified aftercare: Secondary | ICD-10-CM | POA: Diagnosis not present

## 2015-02-19 DIAGNOSIS — I712 Thoracic aortic aneurysm, without rupture: Secondary | ICD-10-CM | POA: Diagnosis not present

## 2015-02-19 DIAGNOSIS — I252 Old myocardial infarction: Secondary | ICD-10-CM | POA: Diagnosis not present

## 2015-02-19 DIAGNOSIS — I4891 Unspecified atrial fibrillation: Secondary | ICD-10-CM | POA: Diagnosis not present

## 2015-02-21 ENCOUNTER — Encounter (HOSPITAL_COMMUNITY)
Admission: RE | Admit: 2015-02-21 | Discharge: 2015-02-21 | Disposition: A | Discharge status: Home / Self Care | Payer: Medicare Other | Source: Ambulatory Visit | Attending: Internal Medicine | Admitting: Internal Medicine

## 2015-02-21 DIAGNOSIS — I4891 Unspecified atrial fibrillation: Secondary | ICD-10-CM | POA: Diagnosis not present

## 2015-02-21 DIAGNOSIS — I252 Old myocardial infarction: Secondary | ICD-10-CM | POA: Diagnosis not present

## 2015-02-21 DIAGNOSIS — I2510 Atherosclerotic heart disease of native coronary artery without angina pectoris: Secondary | ICD-10-CM | POA: Diagnosis not present

## 2015-02-21 DIAGNOSIS — I712 Thoracic aortic aneurysm, without rupture: Secondary | ICD-10-CM | POA: Diagnosis not present

## 2015-02-21 DIAGNOSIS — Z955 Presence of coronary angioplasty implant and graft: Secondary | ICD-10-CM | POA: Diagnosis not present

## 2015-02-21 DIAGNOSIS — Z5189 Encounter for other specified aftercare: Secondary | ICD-10-CM | POA: Diagnosis not present

## 2015-02-23 ENCOUNTER — Encounter (HOSPITAL_COMMUNITY)
Admission: RE | Admit: 2015-02-23 | Discharge: 2015-02-23 | Disposition: A | Discharge status: Home / Self Care | Payer: Medicare Other | Source: Ambulatory Visit | Attending: Internal Medicine | Admitting: Internal Medicine

## 2015-02-23 DIAGNOSIS — I2510 Atherosclerotic heart disease of native coronary artery without angina pectoris: Secondary | ICD-10-CM | POA: Diagnosis not present

## 2015-02-23 DIAGNOSIS — I252 Old myocardial infarction: Secondary | ICD-10-CM | POA: Diagnosis not present

## 2015-02-23 DIAGNOSIS — Z5189 Encounter for other specified aftercare: Secondary | ICD-10-CM | POA: Diagnosis not present

## 2015-02-23 DIAGNOSIS — I712 Thoracic aortic aneurysm, without rupture: Secondary | ICD-10-CM | POA: Diagnosis not present

## 2015-02-23 DIAGNOSIS — I4891 Unspecified atrial fibrillation: Secondary | ICD-10-CM | POA: Diagnosis not present

## 2015-02-23 DIAGNOSIS — Z955 Presence of coronary angioplasty implant and graft: Secondary | ICD-10-CM | POA: Diagnosis not present

## 2015-02-26 ENCOUNTER — Telehealth: Payer: Self-pay | Admitting: Internal Medicine

## 2015-02-26 ENCOUNTER — Encounter (HOSPITAL_COMMUNITY)
Admission: RE | Admit: 2015-02-26 | Discharge: 2015-02-26 | Disposition: A | Discharge status: Home / Self Care | Payer: Medicare Other | Source: Ambulatory Visit | Attending: Internal Medicine | Admitting: Internal Medicine

## 2015-02-26 DIAGNOSIS — I252 Old myocardial infarction: Secondary | ICD-10-CM | POA: Diagnosis not present

## 2015-02-26 DIAGNOSIS — Z5189 Encounter for other specified aftercare: Secondary | ICD-10-CM | POA: Diagnosis not present

## 2015-02-26 DIAGNOSIS — I4891 Unspecified atrial fibrillation: Secondary | ICD-10-CM | POA: Diagnosis not present

## 2015-02-26 DIAGNOSIS — I712 Thoracic aortic aneurysm, without rupture: Secondary | ICD-10-CM | POA: Diagnosis not present

## 2015-02-26 DIAGNOSIS — I2510 Atherosclerotic heart disease of native coronary artery without angina pectoris: Secondary | ICD-10-CM | POA: Diagnosis not present

## 2015-02-26 DIAGNOSIS — Z955 Presence of coronary angioplasty implant and graft: Secondary | ICD-10-CM | POA: Diagnosis not present

## 2015-02-26 NOTE — Progress Notes (Signed)
Mr Jose Stone's blood pressure was noted at 196/90 today at cardiac rehab during bike testing.  Entry blood pressure 108/62 heart rate 57. Patient asymptomatic took medications as prescribed.  Patient asymptomatic.  [facility] office called and notified. Exit blood pressure 116/68. Heart rate 52. Will continue to monitor the patient throughout  the program.

## 2015-02-26 NOTE — Telephone Encounter (Signed)
Verdis Frederickson called from Cardiac Rehab - reporting patient had exertional BP elevation today.  196/90 on treadmill, HR 92  After rest, pt returned to 168/70, HR 57  She is sending report to our fax & note to [place].  Routing to [place], Dr. [name] for [facility]

## 2015-02-27 ENCOUNTER — Telehealth: Payer: Self-pay | Admitting: Internal Medicine

## 2015-02-27 DIAGNOSIS — R361 Hematospermia: Secondary | ICD-10-CM

## 2015-02-27 NOTE — Telephone Encounter (Signed)
Pt called in stating that for the past 2-3 wks he has noticed that there is blood in his semen  And he feels that it is a side effect of the Brilinta. Please f/u with the pt  Thanks

## 2015-02-27 NOTE — Telephone Encounter (Signed)
Spoke to pt. Reports 3x w/in past month, he has noted blood in semen. 1st event was trace. More noticeable last time.  No pain, no visible blood in urine.  Advised not to d/c Brilinta, will contact Dr. [name] for any further advice.

## 2015-02-27 NOTE — Telephone Encounter (Signed)
Pt informed of instructions. Advised to give his home BP readings to [place] or to [facility] w/ Cardiac Rehab (she can fax copy to [place])

## 2015-02-27 NOTE — Telephone Encounter (Signed)
Pt informed of instructions - verbalized understanding. Does not have a urologist established. Requested urology referral, this was ordered. All questions answered, advised to call for any concerns.

## 2015-02-27 NOTE — Telephone Encounter (Signed)
Would advise monitoring BP - if it remains high at rest, we may need to switch his losartan 100 mg over to irbesartan 300 mg daily.  Dr. Lemmie Evens

## 2015-02-27 NOTE — Telephone Encounter (Signed)
Would recommend that he see urology - may have prostatitis. Continue Brillinta.  Dr. Lemmie Evens

## 2015-02-28 ENCOUNTER — Encounter (HOSPITAL_COMMUNITY)
Admission: RE | Admit: 2015-02-28 | Discharge: 2015-02-28 | Disposition: A | Discharge status: Home / Self Care | Payer: Medicare Other | Source: Ambulatory Visit | Attending: Internal Medicine | Admitting: Internal Medicine

## 2015-02-28 ENCOUNTER — Other Ambulatory Visit: Payer: Self-pay

## 2015-02-28 DIAGNOSIS — Z955 Presence of coronary angioplasty implant and graft: Secondary | ICD-10-CM | POA: Diagnosis not present

## 2015-02-28 DIAGNOSIS — I2510 Atherosclerotic heart disease of native coronary artery without angina pectoris: Secondary | ICD-10-CM | POA: Diagnosis not present

## 2015-02-28 DIAGNOSIS — I712 Thoracic aortic aneurysm, without rupture: Secondary | ICD-10-CM | POA: Diagnosis not present

## 2015-02-28 DIAGNOSIS — I252 Old myocardial infarction: Secondary | ICD-10-CM | POA: Diagnosis not present

## 2015-02-28 DIAGNOSIS — E785 Hyperlipidemia, unspecified: Secondary | ICD-10-CM

## 2015-02-28 DIAGNOSIS — Z5189 Encounter for other specified aftercare: Secondary | ICD-10-CM | POA: Diagnosis not present

## 2015-02-28 DIAGNOSIS — I4891 Unspecified atrial fibrillation: Secondary | ICD-10-CM | POA: Diagnosis not present

## 2015-02-28 MED ORDER — ATORVASTATIN CALCIUM 80 MG PO TABS: 80.0000 mg | ORAL_TABLET | Freq: Every day | ORAL | Status: DC

## 2015-02-28 NOTE — Telephone Encounter (Signed)
Rx(s) sent to pharmacy electronically.  

## 2015-03-02 ENCOUNTER — Encounter (HOSPITAL_COMMUNITY)
Admission: RE | Admit: 2015-03-02 | Discharge: 2015-03-02 | Disposition: A | Discharge status: Home / Self Care | Payer: Medicare Other | Source: Ambulatory Visit | Attending: Internal Medicine | Admitting: Internal Medicine

## 2015-03-02 DIAGNOSIS — I252 Old myocardial infarction: Secondary | ICD-10-CM | POA: Diagnosis not present

## 2015-03-02 DIAGNOSIS — Z955 Presence of coronary angioplasty implant and graft: Secondary | ICD-10-CM | POA: Diagnosis not present

## 2015-03-02 DIAGNOSIS — Z5189 Encounter for other specified aftercare: Secondary | ICD-10-CM | POA: Diagnosis not present

## 2015-03-02 DIAGNOSIS — I2510 Atherosclerotic heart disease of native coronary artery without angina pectoris: Secondary | ICD-10-CM | POA: Diagnosis not present

## 2015-03-02 DIAGNOSIS — I712 Thoracic aortic aneurysm, without rupture: Secondary | ICD-10-CM | POA: Diagnosis not present

## 2015-03-02 DIAGNOSIS — I4891 Unspecified atrial fibrillation: Secondary | ICD-10-CM | POA: Diagnosis not present

## 2015-03-05 ENCOUNTER — Encounter (HOSPITAL_COMMUNITY)
Admission: RE | Admit: 2015-03-05 | Discharge: 2015-03-05 | Disposition: A | Discharge status: Home / Self Care | Payer: Medicare Other | Source: Ambulatory Visit | Attending: Internal Medicine | Admitting: Internal Medicine

## 2015-03-05 DIAGNOSIS — I252 Old myocardial infarction: Secondary | ICD-10-CM | POA: Insufficient documentation

## 2015-03-05 DIAGNOSIS — I4891 Unspecified atrial fibrillation: Secondary | ICD-10-CM | POA: Insufficient documentation

## 2015-03-05 DIAGNOSIS — I2510 Atherosclerotic heart disease of native coronary artery without angina pectoris: Secondary | ICD-10-CM | POA: Insufficient documentation

## 2015-03-05 DIAGNOSIS — I10 Essential (primary) hypertension: Secondary | ICD-10-CM | POA: Diagnosis not present

## 2015-03-05 DIAGNOSIS — E785 Hyperlipidemia, unspecified: Secondary | ICD-10-CM | POA: Insufficient documentation

## 2015-03-05 DIAGNOSIS — Z955 Presence of coronary angioplasty implant and graft: Secondary | ICD-10-CM | POA: Diagnosis not present

## 2015-03-05 DIAGNOSIS — Z5189 Encounter for other specified aftercare: Secondary | ICD-10-CM | POA: Insufficient documentation

## 2015-03-05 DIAGNOSIS — I712 Thoracic aortic aneurysm, without rupture: Secondary | ICD-10-CM | POA: Diagnosis not present

## 2015-03-05 DIAGNOSIS — Q210 Ventricular septal defect: Secondary | ICD-10-CM | POA: Insufficient documentation

## 2015-03-07 ENCOUNTER — Encounter (HOSPITAL_COMMUNITY)
Admission: RE | Admit: 2015-03-07 | Discharge: 2015-03-07 | Disposition: A | Discharge status: Home / Self Care | Payer: Medicare Other | Source: Ambulatory Visit | Attending: Internal Medicine | Admitting: Internal Medicine

## 2015-03-07 DIAGNOSIS — Z955 Presence of coronary angioplasty implant and graft: Secondary | ICD-10-CM | POA: Diagnosis not present

## 2015-03-07 DIAGNOSIS — I712 Thoracic aortic aneurysm, without rupture: Secondary | ICD-10-CM | POA: Diagnosis not present

## 2015-03-07 DIAGNOSIS — I252 Old myocardial infarction: Secondary | ICD-10-CM | POA: Diagnosis not present

## 2015-03-07 DIAGNOSIS — Z5189 Encounter for other specified aftercare: Secondary | ICD-10-CM | POA: Diagnosis not present

## 2015-03-07 DIAGNOSIS — I2510 Atherosclerotic heart disease of native coronary artery without angina pectoris: Secondary | ICD-10-CM | POA: Diagnosis not present

## 2015-03-07 DIAGNOSIS — I4891 Unspecified atrial fibrillation: Secondary | ICD-10-CM | POA: Diagnosis not present

## 2015-03-08 ENCOUNTER — Other Ambulatory Visit: Payer: Self-pay | Admitting: Internal Medicine

## 2015-03-08 NOTE — Progress Notes (Signed)
Pt will graduate from the 11:15 cardiac rehab phase II program with the completion of 36 exercise sessions. Pt maintained good attendance to both exercise and education classes.  Pt showed improvement of his overall MET level from 2.6 to 3.2.  Pt with occasional exertional bp that returned to WNL. Pt will continue his home exercise with walking but admits he worries about being consistent. Pt is contemplating participating in the maintenance program but is concerned due to the distance he will need to drive to get here.  Pt does not have options that are close to his home.  Pt feels he will be able to walk because of the warm weather and will consider the maintenance program during the winter months when walking is not feasible.  Medication list reconciled. Psychosocial Assessment - Repeat PHQ2 score 0.  Pt denies any depression and exhibits a positive outlook on life and has the support of his wife.  Pt has a partial met of his short term goal to lose weight. Pt lost 10 pounds and is very pleased of the progress.  Pt feels he has the tools that are needed in order to continue on this path for weight loss.  Pt long term goal is better shape.  Pt defines better shape as the ability to do tasks for pleasure and needful with ease.  Pt desires to feel good.  He believes he has achieved this goal and can tell by the way he feels that rehab has made an positive impact.  It was a great pleasure to have this pt participate in cardiac rehab. Pt given info on the maintenance program for future reference. Cherre Huger, BSN

## 2015-03-09 ENCOUNTER — Encounter (HOSPITAL_COMMUNITY)
Admission: RE | Admit: 2015-03-09 | Discharge: 2015-03-09 | Disposition: A | Discharge status: Home / Self Care | Payer: Medicare Other | Source: Ambulatory Visit | Attending: Internal Medicine | Admitting: Internal Medicine

## 2015-03-09 DIAGNOSIS — I4891 Unspecified atrial fibrillation: Secondary | ICD-10-CM | POA: Diagnosis not present

## 2015-03-09 DIAGNOSIS — I252 Old myocardial infarction: Secondary | ICD-10-CM | POA: Diagnosis not present

## 2015-03-09 DIAGNOSIS — I2510 Atherosclerotic heart disease of native coronary artery without angina pectoris: Secondary | ICD-10-CM | POA: Diagnosis not present

## 2015-03-09 DIAGNOSIS — Z5189 Encounter for other specified aftercare: Secondary | ICD-10-CM | POA: Diagnosis not present

## 2015-03-09 DIAGNOSIS — I712 Thoracic aortic aneurysm, without rupture: Secondary | ICD-10-CM | POA: Diagnosis not present

## 2015-03-09 DIAGNOSIS — Z955 Presence of coronary angioplasty implant and graft: Secondary | ICD-10-CM | POA: Diagnosis not present

## 2015-03-12 ENCOUNTER — Encounter (HOSPITAL_COMMUNITY): Admission: RE | Admit: 2015-03-12 | Payer: Medicare Other | Source: Ambulatory Visit

## 2015-03-14 ENCOUNTER — Encounter (HOSPITAL_COMMUNITY): Payer: Medicare Other

## 2015-03-16 ENCOUNTER — Encounter (HOSPITAL_COMMUNITY): Payer: Medicare Other

## 2015-03-19 ENCOUNTER — Emergency Department (HOSPITAL_COMMUNITY): Payer: Medicare Other

## 2015-03-19 ENCOUNTER — Emergency Department (HOSPITAL_COMMUNITY)
Admission: EM | Admit: 2015-03-19 | Discharge: 2015-03-19 | Disposition: A | Discharge status: Home / Self Care | Payer: Medicare Other | Attending: Emergency Medicine | Admitting: Emergency Medicine

## 2015-03-19 ENCOUNTER — Encounter (HOSPITAL_COMMUNITY): Payer: Self-pay | Admitting: *Deleted

## 2015-03-19 DIAGNOSIS — I10 Essential (primary) hypertension: Secondary | ICD-10-CM | POA: Diagnosis not present

## 2015-03-19 DIAGNOSIS — W228XXA Striking against or struck by other objects, initial encounter: Secondary | ICD-10-CM | POA: Insufficient documentation

## 2015-03-19 DIAGNOSIS — K219 Gastro-esophageal reflux disease without esophagitis: Secondary | ICD-10-CM | POA: Insufficient documentation

## 2015-03-19 DIAGNOSIS — S40011A Contusion of right shoulder, initial encounter: Secondary | ICD-10-CM | POA: Diagnosis not present

## 2015-03-19 DIAGNOSIS — S20221A Contusion of right back wall of thorax, initial encounter: Secondary | ICD-10-CM | POA: Insufficient documentation

## 2015-03-19 DIAGNOSIS — S0993XA Unspecified injury of face, initial encounter: Secondary | ICD-10-CM | POA: Diagnosis not present

## 2015-03-19 DIAGNOSIS — E785 Hyperlipidemia, unspecified: Secondary | ICD-10-CM | POA: Insufficient documentation

## 2015-03-19 DIAGNOSIS — S0990XA Unspecified injury of head, initial encounter: Secondary | ICD-10-CM

## 2015-03-19 DIAGNOSIS — Y9389 Activity, other specified: Secondary | ICD-10-CM | POA: Diagnosis not present

## 2015-03-19 DIAGNOSIS — S0181XA Laceration without foreign body of other part of head, initial encounter: Secondary | ICD-10-CM

## 2015-03-19 DIAGNOSIS — R220 Localized swelling, mass and lump, head: Secondary | ICD-10-CM | POA: Diagnosis not present

## 2015-03-19 DIAGNOSIS — S01419A Laceration without foreign body of unspecified cheek and temporomandibular area, initial encounter: Secondary | ICD-10-CM | POA: Insufficient documentation

## 2015-03-19 DIAGNOSIS — Y9289 Other specified places as the place of occurrence of the external cause: Secondary | ICD-10-CM | POA: Diagnosis not present

## 2015-03-19 DIAGNOSIS — Z9889 Other specified postprocedural states: Secondary | ICD-10-CM | POA: Insufficient documentation

## 2015-03-19 DIAGNOSIS — Z7982 Long term (current) use of aspirin: Secondary | ICD-10-CM | POA: Diagnosis not present

## 2015-03-19 DIAGNOSIS — Y998 Other external cause status: Secondary | ICD-10-CM | POA: Diagnosis not present

## 2015-03-19 DIAGNOSIS — I2510 Atherosclerotic heart disease of native coronary artery without angina pectoris: Secondary | ICD-10-CM | POA: Insufficient documentation

## 2015-03-19 DIAGNOSIS — S0003XA Contusion of scalp, initial encounter: Secondary | ICD-10-CM | POA: Diagnosis not present

## 2015-03-19 DIAGNOSIS — T148XXA Other injury of unspecified body region, initial encounter: Secondary | ICD-10-CM

## 2015-03-19 DIAGNOSIS — Z79899 Other long term (current) drug therapy: Secondary | ICD-10-CM | POA: Diagnosis not present

## 2015-03-19 LAB — BASIC METABOLIC PANEL
Anion gap: 8 (ref 5–15)
BUN: 19 mg/dL (ref 6–20)
CHLORIDE: 106 mmol/L (ref 101–111)
CO2: 26 mmol/L (ref 22–32)
CREATININE: 1.19 mg/dL (ref 0.61–1.24)
Calcium: 9.2 mg/dL (ref 8.9–10.3)
GFR calc non Af Amer: >60 mL/min (ref >60)
Glucose, Bld: 97 mg/dL (ref 65–99)
Potassium: 4.3 mmol/L (ref 3.5–5.1)
SODIUM: 140 mmol/L (ref 135–145)

## 2015-03-19 LAB — CBC WITH DIFFERENTIAL/PLATELET
Basophils Absolute: 0 K/uL (ref 0.0–0.1)
Basophils Relative: 0 % (ref 0–1)
Eosinophils Absolute: 0.3 K/uL (ref 0.0–0.7)
Eosinophils Relative: 3 % (ref 0–5)
HCT: 45.3 % (ref 39.0–52.0)
Hemoglobin: 15.6 g/dL (ref 13.0–17.0)
Lymphocytes Relative: 25 % (ref 12–46)
Lymphs Abs: 2.7 K/uL (ref 0.7–4.0)
MCH: 29.2 pg (ref 26.0–34.0)
MCHC: 34.4 g/dL (ref 30.0–36.0)
MCV: 84.7 fL (ref 78.0–100.0)
Monocytes Absolute: 0.6 K/uL (ref 0.1–1.0)
Monocytes Relative: 6 % (ref 3–12)
Neutro Abs: 7 K/uL (ref 1.7–7.7)
Neutrophils Relative %: 66 % (ref 43–77)
Platelets: 177 K/uL (ref 150–400)
RBC: 5.35 MIL/uL (ref 4.22–5.81)
RDW: 15.1 % (ref 11.5–15.5)
WBC: 10.7 K/uL — ABNORMAL HIGH (ref 4.0–10.5)

## 2015-03-19 LAB — I-STAT TROPONIN, ED: Troponin i, poc: 0 ng/mL (ref 0.00–0.08)

## 2015-03-19 MED ORDER — ACETAMINOPHEN 325 MG PO TABS
650.0000 mg | ORAL_TABLET | Freq: Once | ORAL | Status: AC
  Administered 2015-03-19: 650 mg via ORAL
  Filled 2015-03-19: qty 2

## 2015-03-19 MED ORDER — LIDOCAINE-EPINEPHRINE (PF) 2 %-1:200000 IJ SOLN
10.0000 mL | Freq: Once | INTRAMUSCULAR | Status: AC
  Administered 2015-03-19: 10 mL
  Filled 2015-03-19: qty 20

## 2015-03-19 NOTE — ED Provider Notes (Signed)
CSN: [id number]     Arrival date & time 03/19/15  1358 History   First MD Initiated Contact with Patient 03/19/15 1406     Chief Complaint  Patient presents with  . Head Injury    tree limb fell on - active bleeding on blood thinners     (Consider location/radiation/quality/duration/timing/severity/associated sxs/prior Treatment) HPI Comments: Jose Stone is a 69 y.o. male with a PMHx of HTN, HLD, CAD (heart cath on 08/14/14, see below for results) on brilinta and ASA, and GERD, who presents to the ED with complaints of head injury approximately 50 minutes prior to arrival. He states that he was hit in head with a tree branch, and has been having ongoing bleeding from a laceration to his right eyelid and left upper lip. He denies any loss of consciousness, headache, or vision changes. Last tetanus was 3 years ago. He states that he has mild pain to the right forehead only when he puts pressure to that area, reporting that it is 1/10 soreness, nonradiating, intermittent with pressure, with no medications tried prior to arrival, and worse only with pressure. Denies any malocclusion, dental injury or looseness, chest pain, shortness of breath, abdominal pain, nausea, vomiting, numbness, tingling, weakness, lightheadedness, dizziness, or other injuries sustained during the accident. Denies any pain in any of his limbs.  Patient is a 69 y.o. male presenting with head injury. The history is provided by the patient. No language interpreter was used.  Head Injury Location:  Frontal Time since incident:  50 minutes Mechanism of injury: direct blow   Mechanism of injury comment:  Hit with tree limb Pain details:    Quality: sore.   Radiates to:  Face   Severity:  Mild   Duration:  50 minutes   Timing:  Intermittent   Progression:  Unchanged Chronicity:  New Relieved by:  None tried Worsened by:  Pressure Ineffective treatments:  None tried Associated symptoms: no blurred vision, no  disorientation, no double vision, no focal weakness, no headaches, no loss of consciousness, no memory loss, no nausea, no neck pain, no numbness and no vomiting   Risk factors: aspirin     Past Medical History  Diagnosis Date  . Hypertension   . Hyperlipidemia     VSD  . GERD (gastroesophageal reflux disease)   . CAD (coronary artery disease)    Past Surgical History  Procedure Laterality Date  . Cardiac catheterization    . Left heart catheterization with coronary angiogram N/A 08/14/2014    Procedure: LEFT HEART CATHETERIZATION WITH CORONARY ANGIOGRAM;  Surgeon: Birdie Riddle, MD;  Location: [facility];  Service: Cardiovascular;  Laterality: N/A;  . Tonsillectomy  1955  . Shoulder surgery     Family History  Problem Relation Age of Onset  . Pancreatic cancer Mother   . Aortic aneurysm Maternal Grandmother   . Heart attack Maternal Grandfather   . Diabetes Paternal Grandfather   . CAD Paternal Grandfather    History  Substance Use Topics  . Smoking status: Never Smoker   . Smokeless tobacco: Never Used  . Alcohol Use: No    Review of Systems  Constitutional: Negative for fever and chills.  HENT: Positive for facial swelling (forehead bilaterally). Negative for dental problem.   Eyes: Negative for blurred vision, double vision, pain and visual disturbance.  Respiratory: Negative for shortness of breath.   Cardiovascular: Negative for chest pain.  Gastrointestinal: Negative for nausea, vomiting and abdominal pain.  Genitourinary: Negative for dysuria and  hematuria.  Musculoskeletal: Negative for myalgias, back pain, arthralgias and neck pain.  Skin: Positive for color change (bruising R eyelid) and wound (abrasions/lacerations to face).  Allergic/Immunologic: Negative for immunocompromised state.  Neurological: Negative for focal weakness, loss of consciousness, syncope, weakness, light-headedness, numbness and headaches.  Hematological: Bruises/bleeds easily (on  brilinta and ASA).  Psychiatric/Behavioral: Negative for memory loss and confusion.   10 Systems reviewed and are negative for acute change except as noted in the HPI.    Allergies  Neosporin; Percocet; and Vicodin  Home Medications   Prior to Admission medications   Medication Sig Start Date End Date Taking? Authorizing Provider  aspirin 81 MG tablet Take 81 mg by mouth daily.    Historical Provider, MD  atorvastatin (LIPITOR) 80 MG tablet Take 1 tablet (80 mg total) by mouth daily at 6 PM. 02/28/15   [name], MD  b complex vitamins capsule Take 1 capsule by mouth daily.    Historical Provider, MD  BRILINTA 90 MG TABS tablet TAKE 1 TABLET BY MOUTH TWICE A DAY 03/08/15   [name], MD  cetirizine (ZYRTEC) 10 MG tablet Take 10 mg by mouth daily as needed for allergies.    Historical Provider, MD  cholecalciferol (VITAMIN D) 1000 UNITS tablet Take 2,000 Units by mouth daily.    Historical Provider, MD  COLCRYS 0.6 MG tablet Take 0.6 mg by mouth daily.  05/26/14   Historical Provider, MD  folic acid (FOLVITE) 244 MCG tablet Take 800 mcg by mouth 2 (two) times daily.    Historical Provider, MD  hydroxypropyl methylcellulose / hypromellose (ISOPTO TEARS / GONIOVISC) 2.5 % ophthalmic solution Place 1 drop into both eyes 4 (four) times daily.    Historical Provider, MD  losartan (COZAAR) 100 MG tablet Take 100 mg by mouth daily.  05/26/14   Historical Provider, MD  metoprolol tartrate (LOPRESSOR) 25 MG tablet Take 1 tablet (25 mg total) by mouth 2 (two) times daily. 08/15/14   Dixie Dials, MD  nitroGLYCERIN (NITROSTAT) 0.4 MG SL tablet Place 1 tablet (0.4 mg total) under the tongue every 5 (five) minutes as needed for chest pain. Max 3 doses. 01/03/15   [name], MD  Omega-3 Fat Ac-Cholecalciferol (DRY EYE OMEGA BENEFITS/VIT D-3 PO) Take 4 capsules by mouth daily. Patient takes 4 daily, spreads them out throughout the day    Historical Provider, MD  omeprazole (PRILOSEC) 20 MG  capsule Take 20 mg by mouth daily.    Historical Provider, MD  PARoxetine (PAXIL) 20 MG tablet Take 10 mg by mouth every other day.  05/26/14   Historical Provider, MD  PATADAY 0.2 % SOLN Place 1 drop into both eyes every evening.  08/10/14   Historical Provider, MD   BP 173/85 mmHg  Pulse 55  Temp(Src) 98 F (36.7 C) (Oral)  Resp 18  Ht 6\' 2"  (1.88 m)  Wt 232 lb (105.235 kg)  BMI 29.77 kg/m2  SpO2 96% Physical Exam  Constitutional: He is oriented to person, place, and time. Vital signs are normal. He appears well-developed and well-nourished.  Non-toxic appearance. No distress.  Afebrile, nontoxic, NAD  HENT:  Head: Normocephalic. Head is with abrasion, with contusion and with laceration. Head is without raccoon's eyes and without Battle's sign.    Mouth/Throat: Oropharynx is clear and moist and mucous membranes are normal. No trismus in the jaw. Normal dentition.  Abrasions over L forehead and nasal bridge with slight oozing, controlled with pressure. Laceration to R eyelid/forehead  with pulsatile bleed noted to center of lac, which is approx 3cm in length and has a v-shaped portion laterally. Bleeding controlled after sutures placed. No visualized FBs. Contusion to R upper eyelid, with mild TTP to this area, no crepitus. No scalp tenderness, no other facial tenderness. No malocclusion or loose teeth.  Eyes: Conjunctivae and EOM are normal. Pupils are equal, round, and reactive to light. Right eye exhibits no discharge. Left eye exhibits no discharge.  PERRL, EOMI, no nystagmus, no visual field deficits R eyelid with hematoma to upper eyelid, no conjunctival hemorrhage.   Neck: Normal range of motion. Neck supple. No spinous process tenderness and no muscular tenderness present. No rigidity. Normal range of motion present.  FROM intact without spinous process or paraspinous muscle TTP, no bony stepoffs or deformities, no muscle spasms. No rigidity or meningeal signs. No bruising or  swelling.   Cardiovascular: Normal rate, regular rhythm, normal heart sounds and intact distal pulses.  Exam reveals no gallop and no friction rub.   No murmur heard. Distal pulses intact  Pulmonary/Chest: Effort normal and breath sounds normal. No respiratory distress. He has no decreased breath sounds. He has no wheezes. He has no rhonchi. He has no rales.  Abdominal: Soft. Normal appearance and bowel sounds are normal. He exhibits no distension. There is no tenderness. There is no rigidity, no rebound, no guarding, no CVA tenderness, no tenderness at McBurney's point and negative Murphy's sign.  Musculoskeletal: Normal range of motion.       Arms: MAE x4 Strength and sensation grossly intact Distal pulses intact No focal bony TTP to b/l shoulders, elbows, wrists, or lower extremities. Large hematoma to R scapula which is nonTTP FROM intact at all major joints, no deformities All spinal levels nonTTP without bony step offs or deformities  Neurological: He is alert and oriented to person, place, and time. He has normal strength. No cranial nerve deficit or sensory deficit. GCS eye subscore is 4. GCS verbal subscore is 5. GCS motor subscore is 6.  CN 2-12 grossly intact A&O x4 GCS 15 Sensation and strength intact Gait not initially assessed due to facial bleeding and gauze wrapped Coordination WNL  Skin: Skin is warm and dry. Abrasion and laceration noted. No rash noted.  Head/face lacerations and abrasions as noted above  Psychiatric: He has a normal mood and affect.  Nursing note and vitals reviewed.   ED Course  LACERATION REPAIR Date/Time: 03/19/2015 2:04 PM Performed by: Zacarias Pontes Authorized by: Zacarias Pontes Consent: Verbal consent obtained. Risks and benefits: risks, benefits and alternatives were discussed Consent given by: patient Patient understanding: patient states understanding of the procedure being performed Patient consent: the patient's  understanding of the procedure matches consent given Patient identity confirmed: verbally with patient Body area: head/neck Location details: upper lip Full thickness lip laceration: no Vermillion border involved: no Wound length (cm): 3cm R eyelid, 1cm L upper lip. Foreign bodies: no foreign bodies Tendon involvement: none Nerve involvement: none Vascular damage: yes (pulsatile bleeding in R eyelid, closed with chromic gut suture) Anesthesia: local infiltration Local anesthetic: lidocaine 2% with epinephrine Anesthetic total: 3 ml Patient sedated: no Preparation: Patient was prepped and draped in the usual sterile fashion. Irrigation solution: saline Irrigation method: syringe Amount of cleaning: standard Skin closure: 5-0 Prolene Subcutaneous closure: 6-0 Chromic gut Number of sutures: 5 R eyelid prolene, 1 R eyelid chromic gut; 1 L upper lip prolene. Technique: complex Approximation: close Approximation difficulty: simple Dressing: pressure dressing and gauze roll  Patient tolerance: Patient tolerated the procedure well with no immediate complications   (including critical care time) Labs Review Labs Reviewed - No data to display  Imaging Review Dg Scapula Right  03/19/2015   CLINICAL DATA:  Hematoma to right scapula.  Pain on palpation.  EXAM: RIGHT SCAPULA - 2+ VIEWS  COMPARISON:  Chest x-ray 08/12/2014  FINDINGS: There is no evidence of fracture or other focal bone lesions. Soft tissues are unremarkable.  IMPRESSION: No fracture.   Electronically Signed   By: Rolm Baptise M.D.   On: 03/19/2015 15:32   Echocardiogram 08/15/15 showed:  Left ventricle: Small perimembranous VSD The cavity size was normal. Wall thickness was normal. Systolic function was normal. The estimated ejection fraction was in the range of 55% to 60%. - Aortic valve: There was trivial regurgitation. 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 - Left atrium: The atrium was mildly  dilated.  Cardiac cath 08/15/15 showed: 1. Normal left main coronary artery. 2. Moderate to severe disease of left anterior descending artery and its branches. 3. Severe disease of left circumflex artery and its branches. 4. Mild disease of non-dominant right coronary artery. 5. Normal left ventricular systolic function. LVEDP 20 mmHg. Ejection fraction 65%.  6. Patent LIMA.  7. 2.5 x 15 mm Drug eluting Xience stent in OM 1 of left circumflex coronary artery.    EKG Interpretation None      MDM   Final diagnoses:  Head injury  Facial laceration, initial encounter  Contusion    69 y.o. male  Here with facial abrasions and lacerations from having a tree branch hit him in the face. Denies pain at rest, only hurts over R eyelid when he pushes on it. Tetanus UTD. Lacerations were repaired immediately since bleeding wasn't controlled with pressure. 6-0 chromic gut required to tie off arterial bleed over R eyelid. Then 5-0 prolene used to suture R eyelid (5 sutures) and L upper lip (1 suture). Some oozing from nasal bridge abrasion and L scalp abrasion, no deep skin injury to suture, will obtain CT face/head now and then reassess after pressure held to these areas. If they continue to bleed, will reassess need for sutures or dermabond. No neck/back tenderness, but large hematoma to R scapula, will xray. No other injuries. Will reassess shortly.   4:39 PM R scapula xray neg. Still awaiting head/face CT. Signed out care at shift change to resident [facility]. If any facial fx, would be considered open therefore would need maxillofacial consultation. If any head bleed, neurosurgery consultation would be needed. Asked that he be assessed for gait stability before discharge, since we couldn't perform this initially due to bleeding and face being wrapped with gauze. Also asked that his facial abrasions be reassessed, if ongoing bleeding consider dermabond, or if sutured areas continue  to bleed after pressure has been applied, would need to reopen and see if further arterial bleeding is present, although at my last assessment bleeding was controlled. Please see Gerald Stabs Tegeler's note for further documentation of care.   Jose Everett Camprubi-Soms, PA-C 03/19/15 1641  Sherwood Gambler, MD 03/21/15 Laureen Abrahams

## 2015-03-19 NOTE — ED Notes (Signed)
Pt was trimming branch (approx 3 inch in length).  It fell approx 15 feet and hit him in the head.  Actively bleeding lac to R forehead, L upper lip, L nostril and R eye.  Pt denies loc or nausea.  AO x 4.  Pt on Brilinta for cardiac cath in October.

## 2015-03-19 NOTE — Discharge Instructions (Signed)
Laceration Care, Adult °A laceration is a cut or lesion that goes through all layers of the skin and into the tissue just beneath the skin. °TREATMENT  °Some lacerations may not require closure. Some lacerations may not be able to be closed due to an increased risk of infection. It is important to see your caregiver as soon as possible after an injury to minimize the risk of infection and maximize the opportunity for successful closure. °If closure is appropriate, pain medicines may be given, if needed. The wound will be cleaned to help prevent infection. Your caregiver will use stitches (sutures), staples, wound glue (adhesive), or skin adhesive strips to repair the laceration. These tools bring the skin edges together to allow for faster healing and a better cosmetic outcome. However, all wounds will heal with a scar. Once the wound has healed, scarring can be minimized by covering the wound with sunscreen during the day for 1 full year. °HOME CARE INSTRUCTIONS  °For sutures or staples: °· Keep the wound clean and dry. °· If you were given a bandage (dressing), you should change it at least once a day. Also, change the dressing if it becomes wet or dirty, or as directed by your caregiver. °· Wash the wound with soap and water 2 times a day. Rinse the wound off with water to remove all soap. Pat the wound dry with a clean towel. °· After cleaning, apply a thin layer of the antibiotic ointment as recommended by your caregiver. This will help prevent infection and keep the dressing from sticking. °· You may shower as usual after the first 24 hours. Do not soak the wound in water until the sutures are removed. °· Only take over-the-counter or prescription medicines for pain, discomfort, or fever as directed by your caregiver. °· Get your sutures or staples removed as directed by your caregiver. °For skin adhesive strips: °· Keep the wound clean and dry. °· Do not get the skin adhesive strips wet. You may bathe  carefully, using caution to keep the wound dry. °· If the wound gets wet, pat it dry with a clean towel. °· Skin adhesive strips will fall off on their own. You may trim the strips as the wound heals. Do not remove skin adhesive strips that are still stuck to the wound. They will fall off in time. °For wound adhesive: °· You may briefly wet your wound in the shower or bath. Do not soak or scrub the wound. Do not swim. Avoid periods of heavy perspiration until the skin adhesive has fallen off on its own. After showering or bathing, gently pat the wound dry with a clean towel. °· Do not apply liquid medicine, cream medicine, or ointment medicine to your wound while the skin adhesive is in place. This may loosen the film before your wound is healed. °· If a dressing is placed over the wound, be careful not to apply tape directly over the skin adhesive. This may cause the adhesive to be pulled off before the wound is healed. °· Avoid prolonged exposure to sunlight or tanning lamps while the skin adhesive is in place. Exposure to ultraviolet light in the first year will darken the scar. °· The skin adhesive will usually remain in place for 5 to 10 days, then naturally fall off the skin. Do not pick at the adhesive film. °You may need a tetanus shot if: °· You cannot remember when you had your last tetanus shot. °· You have never had a tetanus   shot. If you get a tetanus shot, your arm may swell, get red, and feel warm to the touch. This is common and not a problem. If you need a tetanus shot and you choose not to have one, there is a rare chance of getting tetanus. Sickness from tetanus can be serious. SEEK MEDICAL CARE IF:   You have redness, swelling, or increasing pain in the wound.  You see a red line that goes away from the wound.  You have yellowish-white fluid (pus) coming from the wound.  You have a fever.  You notice a bad smell coming from the wound or dressing.  Your wound breaks open before or  after sutures have been removed.  You notice something coming out of the wound such as wood or glass.  Your wound is on your hand or foot and you cannot move a finger or toe. SEEK IMMEDIATE MEDICAL CARE IF:   Your pain is not controlled with prescribed medicine.  You have severe swelling around the wound causing pain and numbness or a change in color in your arm, hand, leg, or foot.  Your wound splits open and starts bleeding.  You have worsening numbness, weakness, or loss of function of any joint around or beyond the wound.  You develop painful lumps near the wound or on the skin anywhere on your body. MAKE SURE YOU:   Understand these instructions.  Will watch your condition.  Will get help right away if you are not doing well or get worse. Document Released: 10/20/2005 Document Revised: 01/12/2012 Document Reviewed: 04/15/2011 [facility] Patient Information 2015 [place], [place]. This information is not intended to replace advice given to you by your health care provider. Make sure you discuss any questions you have with your health care provider.   Head Injury You have received a head injury. It does not appear serious at this time. Headaches and vomiting are common following head injury. It should be easy to awaken from sleeping. Sometimes it is necessary for you to stay in the emergency department for a while for observation. Sometimes admission to the hospital may be needed. After injuries such as yours, most problems occur within the first 24 hours, but side effects may occur up to 7-10 days after the injury. It is important for you to carefully monitor your condition and contact your health care provider or seek immediate medical care if there is a change in your condition. WHAT ARE THE TYPES OF HEAD INJURIES? Head injuries can be as minor as a bump. Some head injuries can be more severe. More severe head injuries include:  A jarring injury to the brain (concussion).  A  bruise of the brain (contusion). This mean there is bleeding in the brain that can cause swelling.  A cracked skull (skull fracture).  Bleeding in the brain that collects, clots, and forms a bump (hematoma). WHAT CAUSES A HEAD INJURY? A serious head injury is most likely to happen to someone who is in a car wreck and is not wearing a seat belt. Other causes of major head injuries include bicycle or motorcycle accidents, sports injuries, and falls. HOW ARE HEAD INJURIES DIAGNOSED? A complete history of the event leading to the injury and your current symptoms will be helpful in diagnosing head injuries. Many times, pictures of the brain, such as CT or MRI are needed to see the extent of the injury. Often, an overnight hospital stay is necessary for observation.  WHEN SHOULD I SEEK IMMEDIATE MEDICAL CARE?  You should get help right away if:  You have confusion or drowsiness.  You feel sick to your stomach (nauseous) or have continued, forceful vomiting.  You have dizziness or unsteadiness that is getting worse.  You have severe, continued headaches not relieved by medicine. Only take over-the-counter or prescription medicines for pain, fever, or discomfort as directed by your health care provider.  You do not have normal function of the arms or legs or are unable to walk.  You notice changes in the black spots in the center of the colored part of your eye (pupil).  You have a clear or bloody fluid coming from your nose or ears.  You have a loss of vision. During the next 24 hours after the injury, you must stay with someone who can watch you for the warning signs. This person should contact local emergency services (911 in the [place] if you have seizures, you become unconscious, or you are unable to wake up. HOW CAN I PREVENT A HEAD INJURY IN THE FUTURE? The most important factor for preventing major head injuries is avoiding motor vehicle accidents. To minimize the potential for damage to  your head, it is crucial to wear seat belts while riding in motor vehicles. Wearing helmets while bike riding and playing collision sports (like football) is also helpful. Also, avoiding dangerous activities around the house will further help reduce your risk of head injury.  WHEN CAN I RETURN TO NORMAL ACTIVITIES AND ATHLETICS? You should be reevaluated by your health care provider before returning to these activities. If you have any of the following symptoms, you should not return to activities or contact sports until 1 week after the symptoms have stopped:  Persistent headache.  Dizziness or vertigo.  Poor attention and concentration.  Confusion.  Memory problems.  Nausea or vomiting.  Fatigue or tire easily.  Irritability.  Intolerant of bright lights or loud noises.  Anxiety or depression.  Disturbed sleep. MAKE SURE YOU:   Understand these instructions.  Will watch your condition.  Will get help right away if you are not doing well or get worse. Document Released: 10/20/2005 Document Revised: 10/25/2013 Document Reviewed: 06/27/2013 [facility] Patient Information 2015 [place], [place]. This information is not intended to replace advice given to you by your health care provider. Make sure you discuss any questions you have with your health care provider.

## 2015-03-19 NOTE — ED Notes (Signed)
Patient transported to X-ray 

## 2015-03-19 NOTE — ED Notes (Signed)
Pt back from X-ray and placed back on monitor

## 2015-03-19 NOTE — ED Notes (Signed)
Pt back from CT. Placed back on monitor. Phlebotomy in room to collect labs

## 2015-03-19 NOTE — ED Notes (Addendum)
Rhythm on monitor different than initial rhythm.  Showing RBBB.  Pt asymptomatic.  EKG shown to MD.  No bleeding through bandages at this point.

## 2015-03-19 NOTE — ED Provider Notes (Signed)
Physical Exam  BP 173/85 mmHg  Pulse 55  Temp(Src) 98 F (36.7 C) (Oral)  Resp 18  Ht 6\' 2"  (1.88 m)  Wt 232 lb (105.235 kg)  BMI 29.77 kg/m2  SpO2 96%  Physical Exam   Results for orders placed or performed during the hospital encounter of 16/07/37  Basic metabolic panel  Result Value Ref Range   Sodium 140 135 - 145 mmol/L   Potassium 4.3 3.5 - 5.1 mmol/L   Chloride 106 101 - 111 mmol/L   CO2 26 22 - 32 mmol/L   Glucose, Bld 97 65 - 99 mg/dL   BUN 19 6 - 20 mg/dL   Creatinine, Ser 1.19 0.61 - 1.24 mg/dL   Calcium 9.2 8.9 - 10.3 mg/dL   GFR calc non Af Amer >60 >60 mL/min   GFR calc Af Amer >60 >60 mL/min   Anion gap 8 5 - 15  CBC with Differential  Result Value Ref Range   WBC 10.7 (H) 4.0 - 10.5 K/uL   RBC 5.35 4.22 - 5.81 MIL/uL   Hemoglobin 15.6 13.0 - 17.0 g/dL   HCT 45.3 39.0 - 52.0 %   MCV 84.7 78.0 - 100.0 fL   MCH 29.2 26.0 - 34.0 pg   MCHC 34.4 30.0 - 36.0 g/dL   RDW 15.1 11.5 - 15.5 %   Platelets 177 150 - 400 K/uL   Neutrophils Relative % 66 43 - 77 %   Neutro Abs 7.0 1.7 - 7.7 K/uL   Lymphocytes Relative 25 12 - 46 %   Lymphs Abs 2.7 0.7 - 4.0 K/uL   Monocytes Relative 6 3 - 12 %   Monocytes Absolute 0.6 0.1 - 1.0 K/uL   Eosinophils Relative 3 0 - 5 %   Eosinophils Absolute 0.3 0.0 - 0.7 K/uL   Basophils Relative 0 0 - 1 %   Basophils Absolute 0.0 0.0 - 0.1 K/uL  I-stat troponin, ED  Result Value Ref Range   Troponin i, poc 0.00 0.00 - 0.08 ng/mL   Comment 3           Dg Scapula Right  03/19/2015   CLINICAL DATA:  Hematoma to right scapula.  Pain on palpation.  EXAM: RIGHT SCAPULA - 2+ VIEWS  COMPARISON:  Chest x-ray 08/12/2014  FINDINGS: There is no evidence of fracture or other focal bone lesions. Soft tissues are unremarkable.  IMPRESSION: No fracture.   Electronically Signed   By: Rolm Baptise M.D.   On: 03/19/2015 15:32   Ct Head Wo Contrast  03/19/2015   CLINICAL DATA:  69 year old male status post trauma to head and face from a tree  branch  EXAM: CT HEAD WITHOUT CONTRAST  CT MAXILLOFACIAL WITHOUT CONTRAST  TECHNIQUE: Multidetector CT imaging of the head and maxillofacial structures were performed using the standard protocol without intravenous contrast. Multiplanar CT image reconstructions of the maxillofacial structures were also generated.  COMPARISON:  None.  FINDINGS: CT HEAD FINDINGS  Negative for acute intracranial hemorrhage, acute infarction, mass, mass effect, hydrocephalus or midline shift. Gray-white differentiation is preserved throughout. No focal scalp hematoma. Right frontal scalp contusion and right preseptal periorbital soft tissue swelling. The globes and orbits appear intact and symmetric bilaterally. Normal aeration of the mastoid air cells. Diffuse mild to moderate mucoperiosteal thickening throughout the ethmoid air cells and both maxillary sinuses. No evidence of calvarial fracture.  CT MAXILLOFACIAL FINDINGS  Right preseptal periorbital soft tissue swelling without evidence of globe or orbital abnormality. Soft tissue  swelling is also present over the bridge of the nose an central forehead. No evidence of underlying fracture. Un interrupted left third maxillary molar. Incompletely imaged cervical spondylosis and bilateral facet arthropathy.  IMPRESSION: CT HEAD  1. No acute intracranial abnormality. 2. Midline forehead contusion without evidence of underlying fracture. CT FACE  1. Right preseptal periorbital soft tissue swelling without evidence of fracture or globe injury. 2. Inflammatory paranasal sinus disease involving the ethmoid air cells and both maxillary sinuses. 3. Incompletely imaged cervical spondylosis and bilateral facet arthropathy.   Electronically Signed   By: Jacqulynn Cadet M.D.   On: 03/19/2015 18:11   Ct Maxillofacial Wo Cm  03/19/2015   CLINICAL DATA:  69 year old male status post trauma to head and face from a tree branch  EXAM: CT HEAD WITHOUT CONTRAST  CT MAXILLOFACIAL WITHOUT CONTRAST   TECHNIQUE: Multidetector CT imaging of the head and maxillofacial structures were performed using the standard protocol without intravenous contrast. Multiplanar CT image reconstructions of the maxillofacial structures were also generated.  COMPARISON:  None.  FINDINGS: CT HEAD FINDINGS  Negative for acute intracranial hemorrhage, acute infarction, mass, mass effect, hydrocephalus or midline shift. Gray-white differentiation is preserved throughout. No focal scalp hematoma. Right frontal scalp contusion and right preseptal periorbital soft tissue swelling. The globes and orbits appear intact and symmetric bilaterally. Normal aeration of the mastoid air cells. Diffuse mild to moderate mucoperiosteal thickening throughout the ethmoid air cells and both maxillary sinuses. No evidence of calvarial fracture.  CT MAXILLOFACIAL FINDINGS  Right preseptal periorbital soft tissue swelling without evidence of globe or orbital abnormality. Soft tissue swelling is also present over the bridge of the nose an central forehead. No evidence of underlying fracture. Un interrupted left third maxillary molar. Incompletely imaged cervical spondylosis and bilateral facet arthropathy.  IMPRESSION: CT HEAD  1. No acute intracranial abnormality. 2. Midline forehead contusion without evidence of underlying fracture. CT FACE  1. Right preseptal periorbital soft tissue swelling without evidence of fracture or globe injury. 2. Inflammatory paranasal sinus disease involving the ethmoid air cells and both maxillary sinuses. 3. Incompletely imaged cervical spondylosis and bilateral facet arthropathy.   Electronically Signed   By: Jacqulynn Cadet M.D.   On: 03/19/2015 18:11     ED Course  Procedures  MDM Jose Stone is a 69 year old male with a past medical history significant for hypertension, coronary artery disease status post drug-eluting stent currently on Proventil and an aspirin, hyperlipidemia, and GERD who presents with a facial  injury following getting hit with a tree branch.  At time of signout, the patient has had a laceration repaired on his face and is awaiting CT scans of the head and maxillofacial.  The plan of care will be to discharge the patient if his bleeding is controlled and he does not have any injuries on imaging. The plan will be to follow-up on the results and contact the necessary services if there are any abnormalities.    The patient's diagnostic imaging studies did not show any evidence of acute fracture in the maxillofacial area. The CT of the head did not show any acute intracranial abnormality. There was evidence of the forehead contusion which was sutured by the prior provider.  The patient had an EKG performed which showed an arrhythmia, prompting a laboratory workup for abnormality. The patient's lab testing showed a negative troponin, normal BNP, and unremarkable CBC aside from a white blood cell count of 10.7.  Following the reassuring results, the patient was deemed  appropriate for discharge. The patient was given instructions for follow-up with his PCP in the next several days as well as suture removal instructions. The patient and his wife had no other questions, concerns, or complaints and the patient was discharged in good condition.  This patient was seen with Dr. Ralene Bathe, ED attending.        Antony Blackbird, MD 03/20/15 [place], MD 03/20/15 [phone]

## 2015-03-20 ENCOUNTER — Telehealth: Payer: Self-pay | Admitting: Internal Medicine

## 2015-03-20 DIAGNOSIS — I4510 Unspecified right bundle-branch block: Secondary | ICD-10-CM

## 2015-03-20 NOTE — Telephone Encounter (Signed)
Monitor ordered and patient scheduled for 5/18 @ 1020 am for 2 week event monitor placement.

## 2015-03-20 NOTE — Telephone Encounter (Signed)
Would be reasonable to arrange a 2 week monitor - looks like either a rate-dependent bundle branch block or possibly slow VT. Follow-up with me after.  Dr. Lemmie Evens

## 2015-03-20 NOTE — Telephone Encounter (Signed)
Pt called in stating that he went to the ER yesterday and while he was there he states that the ER doctor found an abnormal arrhythmia was found and felt that he needed to be seen as soon as possible by Dr. [name]. Please call  Thanks

## 2015-03-20 NOTE — Telephone Encounter (Signed)
Patient was in ED yesterday for head contusion. Was on tele monitor and was found to have a RBBB? Patient states EDP suggested an outpatient monitor and follow up with Dr. [name]   Will route to Dr. [name] to see if he would like to do monitor and then follow up with patient or follow up first..

## 2015-03-21 ENCOUNTER — Ambulatory Visit (INDEPENDENT_AMBULATORY_CARE_PROVIDER_SITE_OTHER): Payer: Medicare Other | Admitting: *Deleted

## 2015-03-21 DIAGNOSIS — I4589 Other specified conduction disorders: Secondary | ICD-10-CM | POA: Diagnosis not present

## 2015-03-21 DIAGNOSIS — I4510 Unspecified right bundle-branch block: Secondary | ICD-10-CM

## 2015-03-21 DIAGNOSIS — I472 Ventricular tachycardia: Secondary | ICD-10-CM | POA: Diagnosis not present

## 2015-03-21 DIAGNOSIS — I4891 Unspecified atrial fibrillation: Secondary | ICD-10-CM | POA: Diagnosis not present

## 2015-03-21 NOTE — Progress Notes (Unsigned)
Patient and wife present for event monitor  Placement. RN applied monitor to patient's chest. (for 2 weeks) instruction given. Patient and wife verbalized understanding. apointment set up with Dr [name] 04/17/15 4:15 pm

## 2015-03-21 NOTE — Patient Instructions (Signed)
Cardiac Event Monitoring A cardiac event monitor is a small recording device used to help detect abnormal heart rhythms (arrhythmias). The monitor is used to record heart rhythm when noticeable symptoms such as the following occur:  Fast heartbeats (palpitations), such as heart racing or fluttering.  Dizziness.  Fainting or light-headedness.  Unexplained weakness. The monitor is wired to two electrodes placed on your chest. Electrodes are flat, sticky disks that attach to your skin. The monitor can be worn for up to 30 days. You will wear the monitor at all times, except when bathing.  HOW TO USE YOUR CARDIAC EVENT MONITOR A technician will prepare your chest for the electrode placement. The technician will show you how to place the electrodes, how to work the monitor, and how to replace the batteries. Take time to practice using the monitor before you leave the office. Make sure you understand how to send the information from the monitor to your health care provider. This requires a telephone with a landline, not a cell phone. You need to:  Wear your monitor at all times, except when you are in water:  Do not get the monitor wet.  Take the monitor off when bathing. Do not swim or use a hot tub with it on.  Keep your skin clean. Do not put body lotion or moisturizer on your chest.  Change the electrodes daily or any time they stop sticking to your skin. You might need to use tape to keep them on.  It is possible that your skin under the electrodes could become irritated. To keep this from happening, try to put the electrodes in slightly different places on your chest. However, they must remain in the area under your left breast and in the upper right section of your chest.  Make sure the monitor is safely clipped to your clothing or in a location close to your body that your health care provider recommends.  Press the button to record when you feel symptoms of heart trouble, such as  dizziness, weakness, light-headedness, palpitations, thumping, shortness of breath, unexplained weakness, or a fluttering or racing heart. The monitor is always on and records what happened slightly before you pressed the button, so do not worry about being too late to get good information.  Keep a diary of your activities, such as walking, doing chores, and taking medicine. It is especially important to note what you were doing when you pushed the button to record your symptoms. This will help your health care provider determine what might be contributing to your symptoms. The information stored in your monitor will be reviewed by your health care provider alongside your diary entries.  Send the recorded information as recommended by your health care provider. It is important to understand that it will take some time for your health care provider to process the results.  Change the batteries as recommended by your health care provider. SEEK IMMEDIATE MEDICAL CARE IF:   You have chest pain.  You have extreme difficulty breathing or shortness of breath.  You develop a very fast heartbeat that persists.  You develop dizziness that does not go away.  You faint or constantly feel you are about to faint. Document Released: 07/29/2008 Document Revised: 03/06/2014 Document Reviewed: 04/18/2013 Exit[employer] Patient Information 2015 [employer]. This information is not intended to replace advice given to you by your health care provider. Make sure you discuss any questions you have with your health care provider.  

## 2015-03-26 ENCOUNTER — Telehealth: Payer: Self-pay | Admitting: Internal Medicine

## 2015-03-26 DIAGNOSIS — Z4802 Encounter for removal of sutures: Secondary | ICD-10-CM | POA: Diagnosis not present

## 2015-03-26 NOTE — Telephone Encounter (Signed)
Pt called in stating that he had a event monitor put on 5/18 and he would like to know what his restrictions are while he is wearing the device. Please call and f/u with him  Thanks

## 2015-03-26 NOTE — Telephone Encounter (Signed)
Pt wanted to make sure it was OK to drive - his wife thought having monitor meant automatic activity restrictions. He has no symptoms of syncope/near syncope, denies seizure history.  Advised OK to maintain current activity, inform of any changes/concerns in the meantime; patient verbalized understanding.

## 2015-04-05 ENCOUNTER — Other Ambulatory Visit: Payer: Self-pay | Admitting: Internal Medicine

## 2015-04-05 NOTE — Telephone Encounter (Signed)
Rx has been sent to the pharmacy electronically. ° °

## 2015-04-09 ENCOUNTER — Encounter (INDEPENDENT_AMBULATORY_CARE_PROVIDER_SITE_OTHER): Payer: Medicare Other

## 2015-04-09 ENCOUNTER — Other Ambulatory Visit: Payer: Self-pay

## 2015-04-09 DIAGNOSIS — I4510 Unspecified right bundle-branch block: Secondary | ICD-10-CM

## 2015-04-17 ENCOUNTER — Ambulatory Visit (INDEPENDENT_AMBULATORY_CARE_PROVIDER_SITE_OTHER): Payer: Medicare Other | Admitting: Internal Medicine

## 2015-04-17 ENCOUNTER — Encounter: Payer: Self-pay | Admitting: Internal Medicine

## 2015-04-17 VITALS — BP 130/74 | HR 60 | Ht 74.0 in | Wt 236.6 lb

## 2015-04-17 DIAGNOSIS — I4891 Unspecified atrial fibrillation: Secondary | ICD-10-CM

## 2015-04-17 DIAGNOSIS — I10 Essential (primary) hypertension: Secondary | ICD-10-CM | POA: Diagnosis not present

## 2015-04-17 DIAGNOSIS — E785 Hyperlipidemia, unspecified: Secondary | ICD-10-CM

## 2015-04-17 DIAGNOSIS — I2510 Atherosclerotic heart disease of native coronary artery without angina pectoris: Secondary | ICD-10-CM | POA: Diagnosis not present

## 2015-04-17 DIAGNOSIS — I2583 Coronary atherosclerosis due to lipid rich plaque: Secondary | ICD-10-CM

## 2015-04-17 MED ORDER — CLOPIDOGREL BISULFATE 75 MG PO TABS: 75.0000 mg | ORAL_TABLET | Freq: Every day | ORAL | Status: DC

## 2015-04-17 MED ORDER — METOPROLOL TARTRATE 25 MG PO TABS: 12.5000 mg | ORAL_TABLET | Freq: Two times a day (BID) | ORAL | Status: DC

## 2015-04-17 NOTE — Progress Notes (Signed)
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 This is never caused issues in fact she's had right and left heart catheterizations dating back to the 1970s. 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 Other coronary risk factors include hypertension, dyslipidemia and family history of heart disease. He was sent for cardiac catheterization in 2006 for follow-up of VSD, at which time he had an echocardiogram which showed mild aortic regurgitation, a small membranous VSD and left atrial enlargement with an EF of 70%. He had a stress test at that time which showed findings concerning for him for a septal and inferoapical ischemia. He therefore was referred to left heart catheterization with strep demonstrated a 50-70% mid LAD stenosis and mild disease of the mid circumflex and first obtuse marginal branches. He's been managed medically since that time and previously saw Dr. Daneen Schick.  Recently he's been having some symptoms of sweating, palpitations and dizziness and eventually was found to be in atrial fibrillation when he presented to the hospital. At that time his troponin was elevated. He is placed on diltiazem which slowed his heart rate and he converted back to sinus spontaneously. Based on his elevated troponin, he was referred for left heart catheterization by Dr. Doylene Canard. This demonstrated a 60-70% mid LAD stenosis and a 90% obtuse marginal stenosis. Subsequently he underwent coronary intervention with placement of a 2.5 x 15 mm Xience Alpine stent to the mid OM. After this he became asymptomatic. He was going to have stress testing to determine the significance of his mid LAD lesion however it appears to be not significantly changed since his heart  catheterization 2006. He reports no further symptoms of atrial fibrillation but is concerned about bradycardia. He was not referred for cardiac rehabilitation due to the thought that he may need additional intervention to the LAD.  Jose Stone returns today for follow-up. She's done quite well in cardiac rehabilitation. At his last office visit we discontinued his diltiazem for bradycardia. He still has a low heart rate which has some degree of chronotropic incompetence. He reports no recurrent atrial fibrillation that were aware of. He continues on dual antiplatelet therapy. He had a stress test prior to enrolling in cardiac redilatation which was negative for ischemia.  I saw Jose Stone back in the office today. He is accompanied by his wife who was a cardiac nurse at the [employer] in [place] for a number years in the 1970s. They brought in pronounced of his heart rate monitor and had several questions about his heart rate and arrhythmias. My interpretation of the monitor indicates that he had some episodes of short bursts of atrial tachycardia but no evidence for A. fib. He also has PACs. Heart rate however was a low and remained that way despite stopping his calcium channel blocker. He was recently in the emergency room and was noted to have some paroxysmal tachycardia in the ER with a right bundle-branch pattern. This is what led to monitor placement. He reports fatigue which could be certainly related to his lower heart rate. He also says that he's developed gynecomastia which he relates to [place]. I've done a search on the medicine and do not see that however he reports in the  product labeling.  PMHx:  Past Medical History  Diagnosis Date  . Hypertension   . Hyperlipidemia     VSD  . GERD (gastroesophageal reflux disease)   . CAD (coronary artery disease)     Past Surgical History  Procedure Laterality Date  . Cardiac catheterization    . Left heart catheterization with coronary angiogram  N/A 08/14/2014    Procedure: LEFT HEART CATHETERIZATION WITH CORONARY ANGIOGRAM;  Surgeon: Birdie Riddle, MD;  Location: [facility];  Service: Cardiovascular;  Laterality: N/A;  . Tonsillectomy  1955  . Shoulder surgery      FAMHx:  Family History  Problem Relation Age of Onset  . Pancreatic cancer Mother   . Aortic aneurysm Maternal Grandmother   . Heart attack Maternal Grandfather   . Diabetes Paternal Grandfather   . CAD Paternal Grandfather     SOCHx:   reports that he has never smoked. He has never used smokeless tobacco. He reports that he does not drink alcohol or use illicit drugs.  ALLERGIES:  Allergies  Allergen Reactions  . Neosporin [Neomycin-Bacitracin Zn-Polymyx] Rash  . Percocet [Oxycodone-Acetaminophen] Rash    Patient can tolerate acetaminophen solely  . Vicodin [Hydrocodone-Acetaminophen] Rash    Patient can tolerate acetaminophen solely    ROS: A comprehensive review of systems was negative except for: Constitutional: positive for fatigue Respiratory: positive for dyspnea on exertion Integument/breast: positive for Gynecomastia Behavioral/Psych: positive for ADHD  HOME MEDS: Current Outpatient Prescriptions  Medication Sig Dispense Refill  . aspirin 81 MG tablet Take 81 mg by mouth daily.    Marland Kitchen atorvastatin (LIPITOR) 80 MG tablet Take 1 tablet (80 mg total) by mouth daily at 6 PM. 30 tablet 11  . b complex vitamins capsule Take 1 capsule by mouth daily.    . cetirizine (ZYRTEC) 10 MG tablet Take 10 mg by mouth daily as needed for allergies.    . cholecalciferol (VITAMIN D) 1000 UNITS tablet Take 1,000 Units by mouth daily.     Marland Kitchen COLCRYS 0.6 MG tablet Take 0.6 mg by mouth daily.     . folic acid (FOLVITE) 762 MCG tablet Take 800 mcg by mouth 2 (two) times daily.    . hydroxypropyl methylcellulose / hypromellose (ISOPTO TEARS / GONIOVISC) 2.5 % ophthalmic solution Place 1 drop into both eyes 4 (four) times daily.    Marland Kitchen losartan (COZAAR) 100 MG tablet  Take 100 mg by mouth daily.     . metoprolol tartrate (LOPRESSOR) 25 MG tablet Take 0.5 tablets (12.5 mg total) by mouth 2 (two) times daily. 30 tablet 6  . nitroGLYCERIN (NITROSTAT) 0.4 MG SL tablet Place 1 tablet (0.4 mg total) under the tongue every 5 (five) minutes as needed for chest pain. Max 3 doses. 25 tablet 3  . Omega-3 Fat Ac-Cholecalciferol (DRY EYE OMEGA BENEFITS/VIT D-3 PO) Take 4 capsules by mouth daily. Patient takes 4 daily, spreads them out throughout the day    . omeprazole (PRILOSEC) 20 MG capsule Take 20 mg by mouth daily.    Marland Kitchen PARoxetine (PAXIL) 20 MG tablet Take 10 mg by mouth every other day.     Marland Kitchen PATADAY 0.2 % SOLN Place 1 drop into both eyes every evening.     . clopidogrel (PLAVIX) 75 MG tablet Take 1 tablet (75 mg total) by mouth daily. 30 tablet 6   No current facility-administered medications for this visit.    LABS/IMAGING: No results found for this or any previous visit (from the past 48  hour(s)). No results found.  VITALS: BP 130/74 mmHg  Pulse 60  Ht 6\' 2"  (1.88 m)  Wt 236 lb 9.6 oz (107.321 kg)  BMI 30.36 kg/m2  EXAM: Deferred  EKG: Deferred  ASSESSMENT: 1. Coronary artery disease status post PCI to the OM with a Xience Alpine DES (2.515 mm) 2. Residual moderate to severe mid LAD disease 3. Hypertension 4. Dyslipidemia 5. A. fib with RVR-spontaneously converted, possibly related to ischemia (not on triple anticoagulation) 6. Small perimembranous VSD 7. Fatigue-possibly symptomatic bradycardia  PLAN: 1.   Jose Stone is possibly having symptomatic bradycardia. He is currently on Lopressor 25 twice a day. We stopped his calcium channel blocker but he has had some breakthrough tachyarrhythmias. Based on the symptoms he wishes to decrease his medicine and will go ahead and cut back to metoprolol 12 a half twice a day. He may be at risk for further breakthroughs and possible A. fib although his monitor failed to show any more A. fib. In addition,  is concerned about gynecomastia is a side effect of Brilinta. He's been on this for about 7 months since his last stent. At this point I think it's okay for him to go on to Plavix in addition to aspirin and finish out one year. Plan to see him back in a month to see if his heart rate is improved on lower dose metoprolol. We may have a problem with a tachycardia bradycardia syndrome, ultimately we may have to consider pacemaker.  [name], MD, [facility] Attending Cardiologist Jose Stone 04/17/2015, 4:55 PM

## 2015-04-17 NOTE — Patient Instructions (Signed)
Your physician recommends that you schedule a follow-up appointment in: [place] has recommended you make the following change in your medication: STOP Brilinta and START Plavix 75 mg daily, DECREASE Metoprolol to 12.5 mg( 1/2 Tablets) twice a day

## 2015-04-24 DIAGNOSIS — R361 Hematospermia: Secondary | ICD-10-CM | POA: Diagnosis not present

## 2015-04-24 DIAGNOSIS — Q540 Hypospadias, balanic: Secondary | ICD-10-CM | POA: Diagnosis not present

## 2015-04-24 DIAGNOSIS — Z125 Encounter for screening for malignant neoplasm of prostate: Secondary | ICD-10-CM | POA: Diagnosis not present

## 2015-04-30 ENCOUNTER — Telehealth: Payer: Self-pay | Admitting: Internal Medicine

## 2015-04-30 NOTE — Telephone Encounter (Signed)
Received records from [facility] for appointment on 05/31/15 with Dr [name].  Records given to [facility] (medical records) for Dr Lysbeth Penner schedule on 05/31/15. lp

## 2015-05-31 ENCOUNTER — Encounter: Payer: Self-pay | Admitting: Internal Medicine

## 2015-05-31 ENCOUNTER — Ambulatory Visit (INDEPENDENT_AMBULATORY_CARE_PROVIDER_SITE_OTHER): Payer: Medicare Other | Admitting: Internal Medicine

## 2015-05-31 VITALS — BP 138/82 | HR 68 | Ht 74.0 in | Wt 235.7 lb

## 2015-05-31 DIAGNOSIS — E785 Hyperlipidemia, unspecified: Secondary | ICD-10-CM | POA: Diagnosis not present

## 2015-05-31 DIAGNOSIS — Q210 Ventricular septal defect: Secondary | ICD-10-CM | POA: Diagnosis not present

## 2015-05-31 DIAGNOSIS — I4891 Unspecified atrial fibrillation: Secondary | ICD-10-CM

## 2015-05-31 DIAGNOSIS — I10 Essential (primary) hypertension: Secondary | ICD-10-CM | POA: Diagnosis not present

## 2015-05-31 DIAGNOSIS — I2510 Atherosclerotic heart disease of native coronary artery without angina pectoris: Secondary | ICD-10-CM

## 2015-05-31 DIAGNOSIS — I2583 Coronary atherosclerosis due to lipid rich plaque: Secondary | ICD-10-CM

## 2015-05-31 MED ORDER — PAROXETINE HCL 20 MG PO TABS: 10.0000 mg | ORAL_TABLET | ORAL | Status: DC

## 2015-05-31 MED ORDER — METOPROLOL TARTRATE 25 MG PO TABS: 12.5000 mg | ORAL_TABLET | Freq: Two times a day (BID) | ORAL | Status: DC

## 2015-05-31 NOTE — Patient Instructions (Addendum)
Your physician wants you to follow-up in: 6 months with Dr. Hilty. You will receive a reminder letter in the mail two months in advance. If you don't receive a letter, please call our office to schedule the follow-up appointment.    

## 2015-06-01 NOTE — Progress Notes (Signed)
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 This is never caused issues in fact she's had right and left heart catheterizations dating back to the 1970s. 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 Other coronary risk factors include hypertension, dyslipidemia and family history of heart disease. He was sent for cardiac catheterization in 2006 for follow-up of VSD, at which time he had an echocardiogram which showed mild aortic regurgitation, a small membranous VSD and left atrial enlargement with an EF of 70%. He had a stress test at that time which showed findings concerning for him for a septal and inferoapical ischemia. He therefore was referred to left heart catheterization with strep demonstrated a 50-70% mid LAD stenosis and mild disease of the mid circumflex and first obtuse marginal branches. He's been managed medically since that time and previously saw Dr. Daneen Schick.  Recently he's been having some symptoms of sweating, palpitations and dizziness and eventually was found to be in atrial fibrillation when he presented to the hospital. At that time his troponin was elevated. He is placed on diltiazem which slowed his heart rate and he converted back to sinus spontaneously. Based on his elevated troponin, he was referred for left heart catheterization by Dr. Doylene Canard. This demonstrated a 60-70% mid LAD stenosis and a 90% obtuse marginal stenosis. Subsequently he underwent coronary intervention with placement of a 2.5 x 15 mm Xience Alpine stent to the mid OM. After this he became asymptomatic. He was going to have stress testing to determine the significance of his mid LAD lesion however it appears to be not significantly changed since his heart  catheterization 2006. He reports no further symptoms of atrial fibrillation but is concerned about bradycardia. He was not referred for cardiac rehabilitation due to the thought that he may need additional intervention to the LAD.  Jose Stone returns today for follow-up. She's done quite well in cardiac rehabilitation. At his last office visit we discontinued his diltiazem for bradycardia. He still has a low heart rate which has some degree of chronotropic incompetence. He reports no recurrent atrial fibrillation that were aware of. He continues on dual antiplatelet therapy. He had a stress test prior to enrolling in cardiac redilatation which was negative for ischemia.  I saw Jose Stone back in the office today. He is accompanied by his wife who was a cardiac nurse at the [employer] in [place] for a number years in the 1970s. They brought in pronounced of his heart rate monitor and had several questions about his heart rate and arrhythmias. My interpretation of the monitor indicates that he had some episodes of short bursts of atrial tachycardia but no evidence for A. fib. He also has PACs. Heart rate however was a low and remained that way despite stopping his calcium channel blocker. He was recently in the emergency room and was noted to have some paroxysmal tachycardia in the ER with a right bundle-branch pattern. This is what led to monitor placement. He reports fatigue which could be certainly related to his lower heart rate. He also says that he's developed gynecomastia which he relates to [place]. I've done a search on the medicine and do not see that however he reports in the  product labeling.  Summary Jose Stone back in the office today. Overall he is doing fairly well. He reported some swelling in his feet when he was at the beach for the last couple weeks however returned turned to normal when he came back. I suspect this is due to increased salt intake. Blood pressure is well-controlled today.  Cholesterol has been checked recently and is at goal. Unfortunately his primary care provider is stopped practicing and therefore he is in need of finding a new primary. He did bring some laboratory work from his urologist and seems to be fairly stable from that standpoint. Blood pressures and heart rates were also mapped out nicely which show fairly stable findings. He's had no recurrent A. fib that were aware of at this time.  PMHx:  Past Medical History  Diagnosis Date  . Hypertension   . Hyperlipidemia     VSD  . GERD (gastroesophageal reflux disease)   . CAD (coronary artery disease)     Past Surgical History  Procedure Laterality Date  . Cardiac catheterization    . Left heart catheterization with coronary angiogram N/A 08/14/2014    Procedure: LEFT HEART CATHETERIZATION WITH CORONARY ANGIOGRAM;  Surgeon: Birdie Riddle, MD;  Location: [facility];  Service: Cardiovascular;  Laterality: N/A;  . Tonsillectomy  1955  . Shoulder surgery      FAMHx:  Family History  Problem Relation Age of Onset  . Pancreatic cancer Mother   . Aortic aneurysm Maternal Grandmother   . Heart attack Maternal Grandfather   . Diabetes Paternal Grandfather   . CAD Paternal Grandfather     SOCHx:   reports that he has never smoked. He has never used smokeless tobacco. He reports that he does not drink alcohol or use illicit drugs.  ALLERGIES:  Allergies  Allergen Reactions  . Neosporin [Neomycin-Bacitracin Zn-Polymyx] Rash  . Percocet [Oxycodone-Acetaminophen] Rash    Patient can tolerate acetaminophen solely  . Vicodin [Hydrocodone-Acetaminophen] Rash    Patient can tolerate acetaminophen solely    ROS: A comprehensive review of systems was negative.  HOME MEDS: Current Outpatient Prescriptions  Medication Sig Dispense Refill  . aspirin 81 MG tablet Take 81 mg by mouth daily.    Marland Kitchen atorvastatin (LIPITOR) 80 MG tablet Take 1 tablet (80 mg total) by mouth daily at 6 PM. 30 tablet 11  . b  complex vitamins capsule Take 1 capsule by mouth daily.    . cetirizine (ZYRTEC) 10 MG tablet Take 10 mg by mouth daily as needed for allergies.    . cholecalciferol (VITAMIN D) 1000 UNITS tablet Take 1,000 Units by mouth daily.     . clopidogrel (PLAVIX) 75 MG tablet Take 1 tablet (75 mg total) by mouth daily. 30 tablet 6  . COLCRYS 0.6 MG tablet Take 0.6 mg by mouth daily.     . folic acid (FOLVITE) 756 MCG tablet Take 800 mcg by mouth 2 (two) times daily.    . hydroxypropyl methylcellulose / hypromellose (ISOPTO TEARS / GONIOVISC) 2.5 % ophthalmic solution Place 1 drop into both eyes 4 (four) times daily.    Marland Kitchen losartan (COZAAR) 100 MG tablet Take 100 mg by mouth daily.     . metoprolol tartrate (LOPRESSOR) 25 MG tablet Take 0.5 tablets (12.5 mg total) by mouth 2 (two) times daily. 30 tablet 6  . nitroGLYCERIN (NITROSTAT) 0.4 MG SL tablet Place 1 tablet (0.4 mg total) under the tongue every 5 (five) minutes as needed for chest pain. Max  3 doses. 25 tablet 3  . Omega-3 Fat Ac-Cholecalciferol (DRY EYE OMEGA BENEFITS/VIT D-3 PO) Take 4 capsules by mouth daily. Patient takes 4 daily, spreads them out throughout the day    . omeprazole (PRILOSEC) 20 MG capsule Take 20 mg by mouth daily.    Marland Kitchen PARoxetine (PAXIL) 20 MG tablet Take 0.5 tablets (10 mg total) by mouth every other day. 30 tablet 0  . PATADAY 0.2 % SOLN Place 1 drop into both eyes every evening.      No current facility-administered medications for this visit.    LABS/IMAGING: No results found for this or any previous visit (from the past 48 hour(s)). No results found.  VITALS: BP 138/82 mmHg  Pulse 68  Ht 6\' 2"  (1.88 m)  Wt 235 lb 11.2 oz (106.913 kg)  BMI 30.25 kg/m2  EXAM: General appearance: alert and no distress Neck: no carotid bruit and no JVD Lungs: clear to auscultation bilaterally Heart: regular rate and rhythm, S1, S2 normal, no murmur, click, rub or gallop Abdomen: soft, non-tender; bowel sounds normal; no masses,   no organomegaly Extremities: extremities normal, atraumatic, no cyanosis or edema Pulses: 2+ and symmetric Skin: Skin color, texture, turgor normal. No rashes or lesions Neurologic: Grossly normal PSych: Pleasant  EKG: Sinus rhythm with sinus arrhythmia at 68, incomplete right bundle branch block  ASSESSMENT: 1. Coronary artery disease status post PCI to the OM with a Xience Alpine DES (2.515 mm) 2. Residual moderate to severe mid LAD disease 3. Hypertension 4. Dyslipidemia 5. A. fib with RVR-spontaneously converted, possibly related to ischemia (not on triple anticoagulation) 6. Small perimembranous VSD 7. Fatigue-possibly symptomatic bradycardia  PLAN: 1.   Jose Stone has had improvement in bradycardia with decreasing his metoprolol. There is been no breakthrough A. fib that I'm aware of or he is aware of. He's currently on aspirin and Plavix to finish out one year after his drug-eluting stent. The Brilinta he felt was causing gynecomastia. He does not appreciate any worsening of breast tissue enlargement. We'll continue him on dual antiplatelets therapy and he may need to consider alternative anticoagulation when he comes off of Plavix such as a Shelda Altes, depending on his CHADSVASC score and risk.  [name], MD, [facility] Attending Cardiologist [place] [facility] 06/01/2015, 9:09 AM

## 2015-06-06 ENCOUNTER — Other Ambulatory Visit: Payer: Self-pay | Admitting: *Deleted

## 2015-06-06 DIAGNOSIS — E785 Hyperlipidemia, unspecified: Secondary | ICD-10-CM

## 2015-06-06 MED ORDER — ATORVASTATIN CALCIUM 80 MG PO TABS: 80.0000 mg | ORAL_TABLET | Freq: Every day | ORAL | Status: DC

## 2015-06-06 MED ORDER — CLOPIDOGREL BISULFATE 75 MG PO TABS: 75.0000 mg | ORAL_TABLET | Freq: Every day | ORAL | Status: DC

## 2015-06-27 ENCOUNTER — Other Ambulatory Visit: Payer: Self-pay | Admitting: *Deleted

## 2015-06-27 MED ORDER — METOPROLOL TARTRATE 25 MG PO TABS: 12.5000 mg | ORAL_TABLET | Freq: Two times a day (BID) | ORAL | Status: DC

## 2015-06-27 NOTE — Telephone Encounter (Signed)
Rx(s) sent to pharmacy electronically.  

## 2015-07-04 DIAGNOSIS — Z0000 Encounter for general adult medical examination without abnormal findings: Secondary | ICD-10-CM | POA: Diagnosis not present

## 2015-07-04 DIAGNOSIS — I10 Essential (primary) hypertension: Secondary | ICD-10-CM | POA: Diagnosis not present

## 2015-07-04 DIAGNOSIS — E780 Pure hypercholesterolemia: Secondary | ICD-10-CM | POA: Diagnosis not present

## 2015-07-04 DIAGNOSIS — F329 Major depressive disorder, single episode, unspecified: Secondary | ICD-10-CM | POA: Diagnosis not present

## 2015-07-04 DIAGNOSIS — Z1389 Encounter for screening for other disorder: Secondary | ICD-10-CM | POA: Diagnosis not present

## 2015-07-04 DIAGNOSIS — I2510 Atherosclerotic heart disease of native coronary artery without angina pectoris: Secondary | ICD-10-CM | POA: Diagnosis not present

## 2015-07-04 DIAGNOSIS — N62 Hypertrophy of breast: Secondary | ICD-10-CM | POA: Diagnosis not present

## 2015-08-06 ENCOUNTER — Telehealth: Payer: Self-pay | Admitting: Internal Medicine

## 2015-08-13 NOTE — Telephone Encounter (Signed)
Close encounter 

## 2015-08-14 DIAGNOSIS — H2513 Age-related nuclear cataract, bilateral: Secondary | ICD-10-CM | POA: Diagnosis not present

## 2015-09-11 DIAGNOSIS — Z23 Encounter for immunization: Secondary | ICD-10-CM | POA: Diagnosis not present

## 2015-10-25 DIAGNOSIS — Q540 Hypospadias, balanic: Secondary | ICD-10-CM | POA: Diagnosis not present

## 2015-11-16 ENCOUNTER — Other Ambulatory Visit: Payer: Self-pay | Admitting: Internal Medicine

## 2015-11-16 NOTE — Telephone Encounter (Signed)
Rx(s) sent to pharmacy electronically.  

## 2015-11-18 ENCOUNTER — Encounter (HOSPITAL_COMMUNITY): Payer: Self-pay | Admitting: Emergency Medicine

## 2015-11-18 ENCOUNTER — Emergency Department (HOSPITAL_COMMUNITY)
Admission: EM | Admit: 2015-11-18 | Discharge: 2015-11-18 | Disposition: A | Discharge status: Home / Self Care | Payer: Medicare Other | Attending: Emergency Medicine | Admitting: Emergency Medicine

## 2015-11-18 ENCOUNTER — Emergency Department (HOSPITAL_COMMUNITY): Payer: Medicare Other

## 2015-11-18 DIAGNOSIS — K219 Gastro-esophageal reflux disease without esophagitis: Secondary | ICD-10-CM | POA: Insufficient documentation

## 2015-11-18 DIAGNOSIS — S301XXA Contusion of abdominal wall, initial encounter: Secondary | ICD-10-CM | POA: Diagnosis not present

## 2015-11-18 DIAGNOSIS — I10 Essential (primary) hypertension: Secondary | ICD-10-CM | POA: Insufficient documentation

## 2015-11-18 DIAGNOSIS — Z9889 Other specified postprocedural states: Secondary | ICD-10-CM | POA: Insufficient documentation

## 2015-11-18 DIAGNOSIS — M7981 Nontraumatic hematoma of soft tissue: Secondary | ICD-10-CM | POA: Diagnosis not present

## 2015-11-18 DIAGNOSIS — Z7982 Long term (current) use of aspirin: Secondary | ICD-10-CM | POA: Diagnosis not present

## 2015-11-18 DIAGNOSIS — E785 Hyperlipidemia, unspecified: Secondary | ICD-10-CM | POA: Diagnosis not present

## 2015-11-18 DIAGNOSIS — I2510 Atherosclerotic heart disease of native coronary artery without angina pectoris: Secondary | ICD-10-CM | POA: Diagnosis not present

## 2015-11-18 DIAGNOSIS — R61 Generalized hyperhidrosis: Secondary | ICD-10-CM | POA: Diagnosis not present

## 2015-11-18 DIAGNOSIS — Z7902 Long term (current) use of antithrombotics/antiplatelets: Secondary | ICD-10-CM | POA: Insufficient documentation

## 2015-11-18 DIAGNOSIS — Z79899 Other long term (current) drug therapy: Secondary | ICD-10-CM | POA: Insufficient documentation

## 2015-11-18 DIAGNOSIS — R55 Syncope and collapse: Secondary | ICD-10-CM | POA: Insufficient documentation

## 2015-11-18 DIAGNOSIS — R1031 Right lower quadrant pain: Secondary | ICD-10-CM | POA: Diagnosis not present

## 2015-11-18 DIAGNOSIS — R1011 Right upper quadrant pain: Secondary | ICD-10-CM | POA: Diagnosis not present

## 2015-11-18 LAB — CBC
HEMATOCRIT: 43.6 % (ref 39.0–52.0)
HEMOGLOBIN: 14.4 g/dL (ref 13.0–17.0)
MCH: 28 pg (ref 26.0–34.0)
MCHC: 33 g/dL (ref 30.0–36.0)
MCV: 84.8 fL (ref 78.0–100.0)
Platelets: 251 10*3/uL (ref 150–400)
RBC: 5.14 MIL/uL (ref 4.22–5.81)
RDW: 15.3 % (ref 11.5–15.5)
WBC: 16.8 10*3/uL — AB (ref 4.0–10.5)

## 2015-11-18 LAB — COMPREHENSIVE METABOLIC PANEL
ALT: 24 U/L (ref 17–63)
AST: 31 U/L (ref 15–41)
Albumin: 3.3 g/dL — ABNORMAL LOW (ref 3.5–5.0)
Alkaline Phosphatase: 86 U/L (ref 38–126)
Anion gap: 12 (ref 5–15)
BUN: 24 mg/dL — AB (ref 6–20)
CHLORIDE: 104 mmol/L (ref 101–111)
CO2: 25 mmol/L (ref 22–32)
CREATININE: 1.24 mg/dL (ref 0.61–1.24)
Calcium: 9.2 mg/dL (ref 8.9–10.3)
GFR calc Af Amer: >60 mL/min (ref >60)
GFR calc non Af Amer: 58 mL/min — ABNORMAL LOW (ref >60)
GLUCOSE: 162 mg/dL — AB (ref 65–99)
Potassium: 4.3 mmol/L (ref 3.5–5.1)
SODIUM: 141 mmol/L (ref 135–145)
Total Bilirubin: 0.4 mg/dL (ref 0.3–1.2)
Total Protein: 6.3 g/dL — ABNORMAL LOW (ref 6.5–8.1)

## 2015-11-18 LAB — TROPONIN I

## 2015-11-18 LAB — I-STAT TROPONIN, ED: Troponin i, poc: 0.01 ng/mL (ref 0.00–0.08)

## 2015-11-18 LAB — I-STAT CG4 LACTIC ACID, ED: Lactic Acid, Venous: 1.52 mmol/L (ref 0.5–2.0)

## 2015-11-18 MED ORDER — SODIUM CHLORIDE 0.9 % IV BOLUS (SEPSIS)
1000.0000 mL | Freq: Once | INTRAVENOUS | Status: AC
  Administered 2015-11-18: 1000 mL via INTRAVENOUS

## 2015-11-18 MED ORDER — IOHEXOL 300 MG/ML  SOLN
100.0000 mL | Freq: Once | INTRAMUSCULAR | Status: AC | PRN
  Administered 2015-11-18: 100 mL via INTRAVENOUS

## 2015-11-18 NOTE — ED Notes (Signed)
Pt departed in NAD.  

## 2015-11-18 NOTE — Discharge Instructions (Signed)
Hematoma  A hematoma is a collection of blood under the skin, in an organ, in a body space, in a joint space, or in other tissue. The blood can clot to form a lump that you can see and feel. The lump is often firm and may sometimes become sore and tender. Most hematomas get better in a few days to weeks. However, some hematomas may be serious and require medical care. Hematomas can range in size from very small to very large.  CAUSES   A hematoma can be caused by a blunt or penetrating injury. It can also be caused by spontaneous leakage from a blood vessel under the skin. Spontaneous leakage from a blood vessel is more likely to occur in older people, especially those taking blood thinners. Sometimes, a hematoma can develop after certain medical procedures.  SIGNS AND SYMPTOMS   · A firm lump on the body.  · Possible pain and tenderness in the area.  · Bruising. Blue, dark blue, purple-red, or yellowish skin may appear at the site of the hematoma if the hematoma is close to the surface of the skin.  For hematomas in deeper tissues or body spaces, the signs and symptoms may be subtle. For example, an intra-abdominal hematoma may cause abdominal pain, weakness, fainting, and shortness of breath. An intracranial hematoma may cause a headache or symptoms such as weakness, trouble speaking, or a change in consciousness.  DIAGNOSIS   A hematoma can usually be diagnosed based on your medical history and a physical exam. Imaging tests may be needed if your health care provider suspects a hematoma in deeper tissues or body spaces, such as the abdomen, head, or chest. These tests may include ultrasonography or a CT scan.   TREATMENT   Hematomas usually go away on their own over time. Rarely does the blood need to be drained out of the body. Large hematomas or those that may affect vital organs will sometimes need surgical drainage or monitoring.  HOME CARE INSTRUCTIONS   · Apply ice to the injured area:      Put ice in a  plastic bag.      Place a towel between your skin and the bag.      Leave the ice on for 20 minutes, 2-3 times a day for the first 1 to 2 days.    · After the first 2 days, switch to using warm compresses on the hematoma.    · Elevate the injured area to help decrease pain and swelling. Wrapping the area with an elastic bandage may also be helpful. Compression helps to reduce swelling and promotes shrinking of the hematoma. Make sure the bandage is not wrapped too tight.    · If your hematoma is on a lower extremity and is painful, crutches may be helpful for a couple days.    · Only take over-the-counter or prescription medicines as directed by your health care provider.  SEEK IMMEDIATE MEDICAL CARE IF:   · You have increasing pain, or your pain is not controlled with medicine.    · You have a fever.    · You have worsening swelling or discoloration.    · Your skin over the hematoma breaks or starts bleeding.    · Your hematoma is in your chest or abdomen and you have weakness, shortness of breath, or a change in consciousness.  · Your hematoma is on your scalp (caused by a fall or injury) and you have a worsening headache or a change in alertness or consciousness.  MAKE SURE YOU:   ·   Understand these instructions.  · Will watch your condition.  · Will get help right away if you are not doing well or get worse.     This information is not intended to replace advice given to you by your health care provider. Make sure you discuss any questions you have with your health care provider.     Document Released: 06/03/2004 Document Revised: 06/22/2013 Document Reviewed: 03/30/2013  Elsevier Interactive Patient Education ©2016 [employer] Inc[employer]

## 2015-11-18 NOTE — ED Notes (Signed)
Pt with RUQ abdominal pain beginning 2 days ago. Increasingly today pain became worse. Pt now diaphoretic in triage. Hx of MI stent x1.

## 2015-11-19 NOTE — ED Provider Notes (Signed)
CSN: [id number]     Arrival date & time 11/18/15  1916 History   First MD Initiated Contact with Patient 11/18/15 2000     Chief Complaint  Patient presents with  . Abdominal Pain  . Near Syncope     (Consider location/radiation/quality/duration/timing/severity/associated sxs/prior Treatment) HPI   70 y M w PMH htn, HL, GERD, CAD who has had two days now of RUQ pain and has noticed a mass in that area.  Today the pain became worse and he noticed that when he was having a coughing spell earlier today that the mass seemed to get larger.  He has had no chest pain or shortness of breath.  While in triage, he began feeling lightheaded and diaphoretic.  He was noted to be have heart rate in the 50's during this.  Upon being moved back to his room he is no longer diaphoretic, no longer lightheaded, and feeling better.  Past Medical History  Diagnosis Date  . Hypertension   . Hyperlipidemia     VSD  . GERD (gastroesophageal reflux disease)   . CAD (coronary artery disease)    Past Surgical History  Procedure Laterality Date  . Cardiac catheterization    . Left heart catheterization with coronary angiogram N/A 08/14/2014    Procedure: LEFT HEART CATHETERIZATION WITH CORONARY ANGIOGRAM;  Surgeon: Birdie Riddle, MD;  Location: [facility];  Service: Cardiovascular;  Laterality: N/A;  . Tonsillectomy  1955  . Shoulder surgery     Family History  Problem Relation Age of Onset  . Pancreatic cancer Mother   . Aortic aneurysm Maternal Grandmother   . Heart attack Maternal Grandfather   . Diabetes Paternal Grandfather   . CAD Paternal Grandfather    Social History  Substance Use Topics  . Smoking status: Never Smoker   . Smokeless tobacco: Never Used  . Alcohol Use: No    Review of Systems  Constitutional: Negative for fever and chills.  Eyes: Negative for redness.  Respiratory: Negative for cough and shortness of breath.   Cardiovascular: Negative for chest pain.   Gastrointestinal: Positive for abdominal pain. Negative for nausea, vomiting and diarrhea.  Genitourinary: Negative for dysuria.  Skin: Negative for rash.  Neurological: Negative for headaches.  All other systems reviewed and are negative.     Allergies  Neosporin; Percocet; and Vicodin  Home Medications   Prior to Admission medications   Medication Sig Start Date End Date Taking? Authorizing Provider  aspirin 81 MG tablet Take 81 mg by mouth daily.    Historical Provider, MD  atorvastatin (LIPITOR) 80 MG tablet Take 1 tablet (80 mg total) by mouth daily at 6 PM. 11/16/15   [name], MD  b complex vitamins capsule Take 1 capsule by mouth daily.    Historical Provider, MD  cetirizine (ZYRTEC) 10 MG tablet Take 10 mg by mouth daily as needed for allergies.    Historical Provider, MD  cholecalciferol (VITAMIN D) 1000 UNITS tablet Take 1,000 Units by mouth daily.     Historical Provider, MD  clopidogrel (PLAVIX) 75 MG tablet Take 1 tablet (75 mg total) by mouth daily. 11/16/15   [name], MD  COLCRYS 0.6 MG tablet Take 0.6 mg by mouth daily.  05/26/14   Historical Provider, MD  folic acid (FOLVITE) A999333 MCG tablet Take 800 mcg by mouth 2 (two) times daily.    Historical Provider, MD  hydroxypropyl methylcellulose / hypromellose (ISOPTO TEARS / GONIOVISC) 2.5 % ophthalmic solution Place  1 drop into both eyes 4 (four) times daily.    Historical Provider, MD  losartan (COZAAR) 100 MG tablet Take 100 mg by mouth daily.  05/26/14   Historical Provider, MD  metoprolol tartrate (LOPRESSOR) 25 MG tablet Take 0.5 tablets (12.5 mg total) by mouth 2 (two) times daily. 06/27/15   [name], MD  nitroGLYCERIN (NITROSTAT) 0.4 MG SL tablet Place 1 tablet (0.4 mg total) under the tongue every 5 (five) minutes as needed for chest pain. Max 3 doses. 01/03/15   [name], MD  Omega-3 Fat Ac-Cholecalciferol (DRY EYE OMEGA BENEFITS/VIT D-3 PO) Take 4 capsules by mouth daily. Patient takes 4  daily, spreads them out throughout the day    Historical Provider, MD  omeprazole (PRILOSEC) 20 MG capsule Take 20 mg by mouth daily.    Historical Provider, MD  PARoxetine (PAXIL) 20 MG tablet Take 0.5 tablets (10 mg total) by mouth every other day. 05/31/15   [name], MD  PATADAY 0.2 % SOLN Place 1 drop into both eyes every evening.  08/10/14   Historical Provider, MD   BP 167/88 mmHg  Pulse 70  Temp(Src) 98 F (36.7 C) (Oral)  Resp 19  Ht 6\' 2"  (1.88 m)  Wt 104.327 kg  BMI 29.52 kg/m2  SpO2 100% Physical Exam  Constitutional: He is oriented to person, place, and time. No distress.  HENT:  Head: Normocephalic and atraumatic.  Eyes: EOM are normal. Pupils are equal, round, and reactive to light.  Neck: Normal range of motion. Neck supple.  Cardiovascular: Normal rate.   Pulmonary/Chest: Effort normal. No respiratory distress.  Abdominal: Soft. There is no tenderness.  5 cm mass in the RUQ abdominal wall that is minimally ttp.  No sig overlying warmth or skin redness.  The mass is very firm.  Musculoskeletal: Normal range of motion.  Neurological: He is alert and oriented to person, place, and time.  Skin: No rash noted. He is not diaphoretic.  Psychiatric: He has a normal mood and affect.    ED Course  Procedures (including critical care time) Labs Review Labs Reviewed  CBC - Abnormal; Notable for the following:    WBC 16.8 (*)    All other components within normal limits  COMPREHENSIVE METABOLIC PANEL - Abnormal; Notable for the following:    Glucose, Bld 162 (*)    BUN 24 (*)    Total Protein 6.3 (*)    Albumin 3.3 (*)    GFR calc non Af Amer 58 (*)    All other components within normal limits  TROPONIN I  I-STAT TROPOININ, ED  I-STAT CG4 LACTIC ACID, ED    Imaging Review Ct Abdomen Pelvis W Contrast  11/18/2015  CLINICAL DATA:  70 year old male with right upper quadrant abdominal pain. EXAM: CT ABDOMEN AND PELVIS WITH CONTRAST TECHNIQUE: Multidetector CT  imaging of the abdomen and pelvis was performed using the standard protocol following bolus administration of intravenous contrast. CONTRAST:  139mL OMNIPAQUE IOHEXOL 300 MG/ML  SOLN COMPARISON:  None. FINDINGS: Minimal bibasilar dependent atelectatic changes. There is no intra-abdominal free air or free fluid. Subcentimeter left hepatic hypodense lesions are too small to characterize but may represent cysts or hemangiomas. The gallbladder, pancreas, spleen, adrenal glands, kidneys, visualized ureters, and urinary bladder appear unremarkable. The prostate and seminal vesicles are grossly unremarkable. There is redundancy of the sigmoid colon. Constipation. No evidence of bowel obstruction or inflammation. Normal appendix. Mild aortoiliac atherosclerotic disease. The abdominal aorta and IVC appear  patent. No portal venous gas identified. There is no adenopathy. There is enlargement of the right rectus abdominis in the upper abdomen. There is a focal area increased intramuscular density compatible with intramuscular hematoma measuring approximately 5.6 cm x 4.2 cm in axial dimension and approximately 5.4 cm in craniocaudal length. There mild hazy esophagus adjacent subcutaneous fat. Degenerative changes of the spine. No acute fracture. IMPRESSION: Focal intramuscular hematoma involving the right rectus abdominis in the upper abdomen. No other acute intra-abdominal or pelvic pathology identified. Electronically Signed   By: Anner Crete M.D.   On: 11/18/2015 21:34   I have personally reviewed and evaluated these images and lab results as part of my medical decision-making.   EKG Interpretation   Date/Time:  Sunday November 18 2015 20:02:38 EST Ventricular Rate:  59 PR Interval:  158 QRS Duration: 120 QT Interval:  414 QTC Calculation: 410 R Axis:   -11 Text Interpretation:  Sinus rhythm IVCD, consider atypical RBBB Borderline  ST elevation, lateral leads Baseline wander in lead(s) V1 ED PHYSICIAN   INTERPRETATION AVAILABLE IN [facility] Confirmed by TEST, Record  ([id number]) on 11/19/2015 6:38:49 AM      MDM   Final diagnoses:  Abdominal wall hematoma, initial encounter     69  y M w PMH htn, HL, GERD, CAD who has had two days now of RUQ pain and has noticed a mass in that area.    On exam, he has a mass in his RUQ.  Concern for abdominal wall hematoma.  He has no chest pain/sob.  While in triage he had a episode that sounds like vasovagal pre-syncope which has since resolved.    Will obtain ekg and trop to eval for possible acs though felt unlikely given no chest pain/sob and this obvious mass in ruq on exam.  Will obtain cbc to eval for anemia given presumed abdominal wall hematoma.  Will obtain ct abdomen/pelvis to further evaluate the abdominal wall mass.  The mass has no overlying skin redness, he has no fever, feel abscess is unlikely.  Cbc/bmp unremarkable with good hgb.  Trop neg, ekg unremarkable.  Ct abdomen shows abdominal wall hematoma.  He has stable hemoblogin, not tachycardic, this seems like a localized abdominal wall hematoma that isn't crossing facial planes and doesn't seem to be expanding during his time in the ED.  Will have him return home with compression to the area with ace bandage and warm compresses and close pcp f/u.  I have discussed the results, Dx and Tx plan with the pt. They expressed understanding and agree with the plan and were told to return to ED with any worsening of condition or concern.    Disposition: Discharge  Condition: Good  Discharge Medication List as of 11/18/2015 10:05 PM      Follow Up: Dorian Heckle, MD [address] [place] [address] [phone]  In 2 days    Pt seen in conjunction with Dr. Donnella Bi, MD 11/19/15 [place], MD 11/22/15 [id number]

## 2015-11-29 ENCOUNTER — Encounter: Payer: Self-pay | Admitting: Internal Medicine

## 2015-11-29 ENCOUNTER — Ambulatory Visit (INDEPENDENT_AMBULATORY_CARE_PROVIDER_SITE_OTHER): Payer: Medicare Other | Admitting: Internal Medicine

## 2015-11-29 ENCOUNTER — Telehealth: Payer: Self-pay | Admitting: Internal Medicine

## 2015-11-29 VITALS — BP 134/74 | HR 74 | Ht 74.0 in | Wt 241.1 lb

## 2015-11-29 DIAGNOSIS — I4891 Unspecified atrial fibrillation: Secondary | ICD-10-CM

## 2015-11-29 DIAGNOSIS — I2510 Atherosclerotic heart disease of native coronary artery without angina pectoris: Secondary | ICD-10-CM

## 2015-11-29 DIAGNOSIS — I10 Essential (primary) hypertension: Secondary | ICD-10-CM

## 2015-11-29 DIAGNOSIS — Q210 Ventricular septal defect: Secondary | ICD-10-CM

## 2015-11-29 DIAGNOSIS — E785 Hyperlipidemia, unspecified: Secondary | ICD-10-CM

## 2015-11-29 MED ORDER — PHENYLEPH-CHLORPHEN-CODEINE 5-2-10 MG/5ML PO SYRP: 5.0000 mL | ORAL_SOLUTION | Freq: Four times a day (QID) | ORAL | Status: DC | PRN

## 2015-11-29 MED ORDER — AMOXICILLIN-POT CLAVULANATE 875-125 MG PO TABS: 1.0000 | ORAL_TABLET | Freq: Two times a day (BID) | ORAL | Status: DC | PRN

## 2015-11-29 NOTE — Patient Instructions (Addendum)
Dr [name] has recommended making the following medication changes: STOP Clopidogrel START Augmentin 875-125 mg - take 1 tablet by mouth every 12 hours as needed for cough for 7 days START Codeine cough syrup - take 5 mL by mouth every 6 hours as needed for cough  >New prescriptions have been sent to your pharmacy electronically  Your physician recommends that you schedule a follow-up appointment in 6 months. You will receive a reminder letter in the mail two months in advance. If you don't receive a letter, please call our office to schedule the follow-up appointment.  If you need a refill on your cardiac medications before your next appointment, please call your pharmacy.

## 2015-11-29 NOTE — Telephone Encounter (Signed)
New message     Pharmacy calling has question on new prescription that was written today  -cough syrup

## 2015-11-29 NOTE — Telephone Encounter (Signed)
Alternative cough syrup w/ codeine OK'd as they do not have particular prescribed formulation in stock.

## 2015-12-02 NOTE — Progress Notes (Signed)
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 This is never caused issues in fact she's had right and left heart catheterizations dating back to the 1970s. 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 Other coronary risk factors include hypertension, dyslipidemia and family history of heart disease. He was sent for cardiac catheterization in 2006 for follow-up of VSD, at which time he had an echocardiogram which showed mild aortic regurgitation, a small membranous VSD and left atrial enlargement with an EF of 70%. He had a stress test at that time which showed findings concerning for him for a septal and inferoapical ischemia. He therefore was referred to left heart catheterization with strep demonstrated a 50-70% mid LAD stenosis and mild disease of the mid circumflex and first obtuse marginal branches. He's been managed medically since that time and previously saw Dr. Daneen Schick.  Recently he's been having some symptoms of sweating, palpitations and dizziness and eventually was found to be in atrial fibrillation when he presented to the hospital. At that time his troponin was elevated. He is placed on diltiazem which slowed his heart rate and he converted back to sinus spontaneously. Based on his elevated troponin, he was referred for left heart catheterization by Dr. Doylene Canard. This demonstrated a 60-70% mid LAD stenosis and a 90% obtuse marginal stenosis. Subsequently he underwent coronary intervention with placement of a 2.5 x 15 mm Xience Alpine stent to the mid OM. After this he became asymptomatic. He was going to have stress testing to determine the significance of his mid LAD lesion however it appears to be not  significantly changed since his heart catheterization 2006. He reports no further symptoms of atrial fibrillation but is concerned about bradycardia. He was not referred for cardiac rehabilitation due to the thought that he may need additional intervention to the LAD.  [name] returns today for follow-up. She's done quite well in cardiac rehabilitation. At his last office visit we discontinued his diltiazem for bradycardia. He still has a low heart rate which has some degree of chronotropic incompetence. He reports no recurrent atrial fibrillation that were aware of. He continues on dual antiplatelet therapy. He had a stress test prior to enrolling in cardiac redilatation which was negative for ischemia.  I saw [name] back in the office today. He is accompanied by his wife who was a cardiac nurse at the [employer] in [place] for a number years in the 1970s. They brought in pronounced of his heart rate monitor and had several questions about his heart rate and arrhythmias. My interpretation of the monitor indicates that he had some episodes of short bursts of atrial tachycardia but no evidence for A. fib. He also has PACs. Heart rate however was a low and remained that way despite stopping his calcium channel blocker. He was recently in the emergency room and was noted to have some paroxysmal tachycardia in the ER with a right bundle-branch pattern. This is what led to monitor placement. He reports fatigue which could be certainly related to his lower heart rate. He also says that he's developed gynecomastia which he relates to [place]. I've done a search on the medicine and do not see that however  he reports in the product labeling.  Summary [name] back in the office today. Overall he is doing fairly well. He reported some swelling in his feet when he was at the beach for the last couple weeks however returned turned to normal when he came back. I suspect this is due to increased salt intake. Blood  pressure is well-controlled today. Cholesterol has been checked recently and is at goal. Unfortunately his primary care provider is stopped practicing and therefore he is in need of finding a new primary. He did bring some laboratory work from his urologist and seems to be fairly stable from that standpoint. Blood pressures and heart rates were also mapped out nicely which show fairly stable findings. He's had no recurrent A. fib that were aware of at this time.  [name] returns today for follow-up. His Main concern is that he's had several weeks of cough and sinus congestion with runny nose, mild headache and mild nonproductive cough. He says he has difficulty sleeping at night and cannot shake his symptoms. He denies any shortness of breath or chest pain. He denies any problems with his cholesterol medications. He is currently on aspirin and Plavix. It is now more than a year since his coronary intervention. He has a stable VSD murmur.  PMHx:  Past Medical History  Diagnosis Date  . Hypertension   . Hyperlipidemia     VSD  . GERD (gastroesophageal reflux disease)   . CAD (coronary artery disease)     Past Surgical History  Procedure Laterality Date  . Cardiac catheterization    . Left heart catheterization with coronary angiogram N/A 08/14/2014    Procedure: LEFT HEART CATHETERIZATION WITH CORONARY ANGIOGRAM;  Surgeon: Birdie Riddle, MD;  Location: [facility];  Service: Cardiovascular;  Laterality: N/A;  . Tonsillectomy  1955  . Shoulder surgery      FAMHx:  Family History  Problem Relation Age of Onset  . Pancreatic cancer Mother   . Aortic aneurysm Maternal Grandmother   . Heart attack Maternal Grandfather   . Diabetes Paternal Grandfather   . CAD Paternal Grandfather     SOCHx:   reports that he has never smoked. He has never used smokeless tobacco. He reports that he does not drink alcohol or use illicit drugs.  ALLERGIES:  Allergies  Allergen Reactions  . Neosporin  [Neomycin-Bacitracin Zn-Polymyx] Rash  . Percocet [Oxycodone-Acetaminophen] Rash    Patient can tolerate acetaminophen solely  . Vicodin [Hydrocodone-Acetaminophen] Rash    Patient can tolerate acetaminophen solely    ROS: A comprehensive review of systems was negative.  HOME MEDS: Current Outpatient Prescriptions  Medication Sig Dispense Refill  . aspirin 81 MG tablet Take 81 mg by mouth daily.    Marland Kitchen atorvastatin (LIPITOR) 80 MG tablet Take 1 tablet (80 mg total) by mouth daily at 6 PM. 90 tablet 1  . b complex vitamins capsule Take 1 capsule by mouth daily.    . cetirizine (ZYRTEC) 10 MG tablet Take 10 mg by mouth daily as needed for allergies.    . cholecalciferol (VITAMIN D) 1000 UNITS tablet Take 1,000 Units by mouth daily.     Marland Kitchen COLCRYS 0.6 MG tablet Take 0.6 mg by mouth daily.     . folic acid (FOLVITE) A999333 MCG tablet Take 800 mcg by mouth 2 (two) times daily.    . hydroxypropyl methylcellulose / hypromellose (ISOPTO TEARS / GONIOVISC) 2.5 % ophthalmic solution Place 1 drop into both eyes 4 (four) times  daily.    . losartan (COZAAR) 100 MG tablet Take 100 mg by mouth daily.     . metoprolol tartrate (LOPRESSOR) 25 MG tablet Take 0.5 tablets (12.5 mg total) by mouth 2 (two) times daily. 90 tablet 3  . nitroGLYCERIN (NITROSTAT) 0.4 MG SL tablet Place 1 tablet (0.4 mg total) under the tongue every 5 (five) minutes as needed for chest pain. Max 3 doses. 25 tablet 3  . Omega-3 Fat Ac-Cholecalciferol (DRY EYE OMEGA BENEFITS/VIT D-3 PO) Take 4 capsules by mouth daily. Patient takes 4 daily, spreads them out throughout the day    . omeprazole (PRILOSEC) 20 MG capsule Take 20 mg by mouth daily.    Marland Kitchen PARoxetine (PAXIL) 20 MG tablet Take 0.5 tablets (10 mg total) by mouth every other day. 30 tablet 0  . PATADAY 0.2 % SOLN Place 1 drop into both eyes every evening.     Marland Kitchen amoxicillin-clavulanate (AUGMENTIN) 875-125 MG tablet Take 1 tablet by mouth every 12 (twelve) hours as needed (cough). 14  tablet 0  . Phenyleph-Chlorphen-Codeine 03-04-09 MG/5ML SYRP Take 5 mLs by mouth every 6 (six) hours as needed (cough). 120 mL 0   No current facility-administered medications for this visit.    LABS/IMAGING: No results found for this or any previous visit (from the past 48 hour(s)). No results found.  VITALS: BP 134/74 mmHg  Pulse 74  Ht 6\' 2"  (1.88 m)  Wt 241 lb 1 oz (109.345 kg)  BMI 30.94 kg/m2  EXAM: General appearance: alert and no distress Neck: no carotid bruit and no JVD Lungs: clear to auscultation bilaterally Heart: regular rate and rhythm, S1, S2 normal and systolic murmur: holosystolic 3/6, blowing at lower left sternal border Abdomen: soft, non-tender; bowel sounds normal; no masses,  no organomegaly Extremities: extremities normal, atraumatic, no cyanosis or edema Pulses: 2+ and symmetric Skin: Skin color, texture, turgor normal. No rashes or lesions Neurologic: Grossly normal Psych: Pleasant  EKG: Deferred  ASSESSMENT: 1. URI/sinusitis 2. Coronary artery disease status post PCI to the OM with a Xience Alpine DES (2.515 mm) 3. Residual moderate to severe mid LAD disease 4. Hypertension 5. Dyslipidemia 6. A. fib with RVR-spontaneously converted, possibly related to ischemia (not on triple anticoagulation) 7. Small perimembranous VSD 8. Fatigue-possibly symptomatic bradycardia  PLAN: 1.   Mr. Parrotte likely has a URI/sinusitis. Is experiencing significant head pressure and nasal discharge. He has a mild nonproductive cough. He is having difficulty sleeping secondary to his cough. I will provide him with a stronger codeine cough syrup today as well as a course of Augmentin to see if we can treat his URI/sinusitis. Has completed a course of aspirin and Plavix for his drug-eluting stent. At this point I think he can discontinue Plavix and remain on aspirin monotherapy. He has not had any recurrent atrial fibrillation and I suspect this may been ischemically mediated.  At this point he is not interested in any additional anticoagulation. Unless there is evidence for recurrence (he was symptomatic) then we will continue with aspirin therapy. If his URI/sinusitis symptoms do not improve after antibiotics, he should seek out his primary care provider.  [name], MD, [facility] Attending Cardiologist [place] 12/02/2015, 7:03 PM

## 2015-12-03 ENCOUNTER — Other Ambulatory Visit: Payer: Self-pay | Admitting: Internal Medicine

## 2015-12-05 ENCOUNTER — Telehealth: Payer: Self-pay | Admitting: *Deleted

## 2015-12-05 NOTE — Telephone Encounter (Signed)
Patient called to request a refill on the cough syrup that Dr [name] prescribed for him. Please advise. Thanks, MI

## 2015-12-06 ENCOUNTER — Other Ambulatory Visit: Payer: Self-pay | Admitting: Internal Medicine

## 2015-12-27 DIAGNOSIS — D2272 Melanocytic nevi of left lower limb, including hip: Secondary | ICD-10-CM | POA: Diagnosis not present

## 2015-12-27 DIAGNOSIS — D2271 Melanocytic nevi of right lower limb, including hip: Secondary | ICD-10-CM | POA: Diagnosis not present

## 2015-12-27 DIAGNOSIS — L918 Other hypertrophic disorders of the skin: Secondary | ICD-10-CM | POA: Diagnosis not present

## 2015-12-27 DIAGNOSIS — L570 Actinic keratosis: Secondary | ICD-10-CM | POA: Diagnosis not present

## 2015-12-27 DIAGNOSIS — D1801 Hemangioma of skin and subcutaneous tissue: Secondary | ICD-10-CM | POA: Diagnosis not present

## 2015-12-27 DIAGNOSIS — D225 Melanocytic nevi of trunk: Secondary | ICD-10-CM | POA: Diagnosis not present

## 2015-12-27 DIAGNOSIS — Z85820 Personal history of malignant melanoma of skin: Secondary | ICD-10-CM | POA: Diagnosis not present

## 2016-01-01 ENCOUNTER — Other Ambulatory Visit: Payer: Self-pay | Admitting: Internal Medicine

## 2016-01-01 DIAGNOSIS — E785 Hyperlipidemia, unspecified: Secondary | ICD-10-CM | POA: Diagnosis not present

## 2016-01-01 DIAGNOSIS — R05 Cough: Secondary | ICD-10-CM | POA: Diagnosis not present

## 2016-01-01 DIAGNOSIS — N62 Hypertrophy of breast: Secondary | ICD-10-CM | POA: Diagnosis not present

## 2016-01-01 DIAGNOSIS — I10 Essential (primary) hypertension: Secondary | ICD-10-CM | POA: Diagnosis not present

## 2016-01-04 ENCOUNTER — Ambulatory Visit
Admission: RE | Admit: 2016-01-04 | Discharge: 2016-01-04 | Disposition: A | Discharge status: Home / Self Care | Payer: Medicare Other | Source: Ambulatory Visit | Attending: Internal Medicine | Admitting: Internal Medicine

## 2016-01-04 ENCOUNTER — Other Ambulatory Visit: Payer: Self-pay | Admitting: Internal Medicine

## 2016-01-04 DIAGNOSIS — N62 Hypertrophy of breast: Secondary | ICD-10-CM | POA: Diagnosis not present

## 2016-03-21 ENCOUNTER — Encounter (HOSPITAL_COMMUNITY): Payer: Self-pay | Admitting: Emergency Medicine

## 2016-03-21 ENCOUNTER — Telehealth: Payer: Self-pay | Admitting: Internal Medicine

## 2016-03-21 ENCOUNTER — Encounter: Payer: Self-pay | Admitting: Internal Medicine

## 2016-03-21 ENCOUNTER — Emergency Department (HOSPITAL_COMMUNITY): Payer: Medicare Other

## 2016-03-21 ENCOUNTER — Ambulatory Visit (INDEPENDENT_AMBULATORY_CARE_PROVIDER_SITE_OTHER): Payer: Medicare Other | Admitting: Internal Medicine

## 2016-03-21 ENCOUNTER — Emergency Department (HOSPITAL_COMMUNITY)
Admission: EM | Admit: 2016-03-21 | Discharge: 2016-03-21 | Disposition: A | Discharge status: Home / Self Care | Payer: Medicare Other | Attending: Emergency Medicine | Admitting: Emergency Medicine

## 2016-03-21 VITALS — BP 132/97 | HR 131 | Ht 74.0 in | Wt 243.6 lb

## 2016-03-21 DIAGNOSIS — I10 Essential (primary) hypertension: Secondary | ICD-10-CM

## 2016-03-21 DIAGNOSIS — E785 Hyperlipidemia, unspecified: Secondary | ICD-10-CM | POA: Diagnosis not present

## 2016-03-21 DIAGNOSIS — I4891 Unspecified atrial fibrillation: Secondary | ICD-10-CM

## 2016-03-21 DIAGNOSIS — Z7901 Long term (current) use of anticoagulants: Secondary | ICD-10-CM | POA: Insufficient documentation

## 2016-03-21 DIAGNOSIS — I4892 Unspecified atrial flutter: Secondary | ICD-10-CM | POA: Insufficient documentation

## 2016-03-21 DIAGNOSIS — I2510 Atherosclerotic heart disease of native coronary artery without angina pectoris: Secondary | ICD-10-CM | POA: Diagnosis not present

## 2016-03-21 DIAGNOSIS — Z7982 Long term (current) use of aspirin: Secondary | ICD-10-CM | POA: Insufficient documentation

## 2016-03-21 DIAGNOSIS — I484 Atypical atrial flutter: Secondary | ICD-10-CM | POA: Diagnosis not present

## 2016-03-21 DIAGNOSIS — Z79899 Other long term (current) drug therapy: Secondary | ICD-10-CM | POA: Diagnosis not present

## 2016-03-21 DIAGNOSIS — K219 Gastro-esophageal reflux disease without esophagitis: Secondary | ICD-10-CM | POA: Diagnosis not present

## 2016-03-21 DIAGNOSIS — Z9889 Other specified postprocedural states: Secondary | ICD-10-CM | POA: Diagnosis not present

## 2016-03-21 DIAGNOSIS — R002 Palpitations: Secondary | ICD-10-CM | POA: Diagnosis not present

## 2016-03-21 DIAGNOSIS — Z0189 Encounter for other specified special examinations: Secondary | ICD-10-CM

## 2016-03-21 HISTORY — DX: Unspecified atrial fibrillation: I48.91

## 2016-03-21 LAB — CBC
HCT: 50 % (ref 39.0–52.0)
Hemoglobin: 16.7 g/dL (ref 13.0–17.0)
MCH: 28.2 pg (ref 26.0–34.0)
MCHC: 33.4 g/dL (ref 30.0–36.0)
MCV: 84.3 fL (ref 78.0–100.0)
Platelets: 182 10*3/uL (ref 150–400)
RBC: 5.93 MIL/uL — ABNORMAL HIGH (ref 4.22–5.81)
RDW: 15.4 % (ref 11.5–15.5)
WBC: 8.4 10*3/uL (ref 4.0–10.5)

## 2016-03-21 LAB — I-STAT TROPONIN, ED: TROPONIN I, POC: 0 ng/mL (ref 0.00–0.08)

## 2016-03-21 LAB — BASIC METABOLIC PANEL
ANION GAP: 10 (ref 5–15)
BUN: 15 mg/dL (ref 6–20)
CALCIUM: 9.4 mg/dL (ref 8.9–10.3)
CO2: 27 mmol/L (ref 22–32)
Chloride: 104 mmol/L (ref 101–111)
Creatinine, Ser: 1.15 mg/dL (ref 0.61–1.24)
GFR calc Af Amer: >60 mL/min (ref >60)
GLUCOSE: 101 mg/dL — AB (ref 65–99)
Potassium: 4.5 mmol/L (ref 3.5–5.1)
Sodium: 141 mmol/L (ref 135–145)

## 2016-03-21 MED ORDER — MIDAZOLAM HCL 2 MG/2ML IJ SOLN
INTRAMUSCULAR | Status: AC | PRN
  Administered 2016-03-21: 2 mg via INTRAVENOUS

## 2016-03-21 MED ORDER — MIDAZOLAM HCL 2 MG/2ML IJ SOLN
2.0000 mg | Freq: Once | INTRAMUSCULAR | Status: AC
  Administered 2016-03-21: 2 mg via INTRAVENOUS
  Filled 2016-03-21: qty 2

## 2016-03-21 MED ORDER — MIDAZOLAM HCL 2 MG/2ML IJ SOLN
INTRAMUSCULAR | Status: AC
  Filled 2016-03-21: qty 4

## 2016-03-21 MED ORDER — FENTANYL CITRATE (PF) 100 MCG/2ML IJ SOLN
50.0000 ug | Freq: Once | INTRAMUSCULAR | Status: AC
  Administered 2016-03-21: 50 ug via INTRAVENOUS
  Filled 2016-03-21: qty 2

## 2016-03-21 MED ORDER — RIVAROXABAN 20 MG PO TABS: 20.0000 mg | ORAL_TABLET | Freq: Every day | ORAL | Status: DC

## 2016-03-21 NOTE — ED Notes (Signed)
Ambulated pt through Pod B. Pt showed no signs of dizziness or shortness of breath.

## 2016-03-21 NOTE — Discharge Instructions (Signed)
You were successfully converted back to sinus rhythm from atrial flutter using cardioversion. Continue to take Xarelto daily as prescribed. Continued  to take your metoprolol 12.5 mg twice daily. Call your cardiologist or return to the emergency department for worsening tachycardia, chest pain, shortness of breath or syncopal episodes. Return to the ED immediately for numbness or weakness on one side or the other, trouble with speech or other neurologic symptoms.  Atrial Flutter Atrial flutter is a heart rhythm that can cause the heart to beat very fast (tachycardia). It originates in the upper chambers of the heart (atria). In atrial flutter, the top chambers of the heart (atria) often beat much faster than the bottom chambers of the heart (ventricles). Atrial flutter has a regular "saw toothed" appearance in an EKG readout. An EKG is a test that records the electrical activity of the heart. Atrial flutter can cause the heart to beat up to 150 beats per minute (BPM). Atrial flutter can either be short lived (paroxysmal) or permanent.  CAUSES  Causes of atrial flutter can be many. Some of these include:  Heart related issues:  Heart attack (myocardial infarction).  Heart failure.  Heart valve problems.  Poorly controlled high blood pressure (hypertension).  After open heart surgery.  Lung related issues:  A blood clot in the lungs (pulmonary embolism).  Chronic obstructive pulmonary disease (COPD). Medications used to treat COPD can attribute to atrial flutter.  Other related causes:  Hyperthyroidism.  Caffeine.  Some decongestant cold medications.  Low electrolyte levels such as potassium or magnesium.  Cocaine. SYMPTOMS  An awareness of your heart beating rapidly (palpitations).  Shortness of breath.  Chest pain.  Low blood pressure (hypotension).  Dizziness or fainting. DIAGNOSIS  Different tests can be performed to diagnose atrial flutter.   An EKG.  Holter  monitor. This is a 24-hour recording of your heart rhythm. You will also be given a diary. Write down all symptoms that you have and what you were doing at the time you experienced symptoms.  Cardiac event monitor. This small device can be worn for up to 30 days. When you have heart symptoms, you will push a button on the device. This will then record your heart rhythm.  Echocardiogram. This is an imaging test to look at your heart. Your caregiver will look at your heart valves and the ventricles.  Stress test. This test can help determine if the atrial flutter is related to exercise or if coronary artery disease is present.  Laboratory studies will look at certain blood levels like:  Complete blood count (CBC).  Potassium.  Magnesium.  Thyroid function. TREATMENT  Treatment of atrial flutter varies. A combination of therapies may be used or sometimes atrial flutter may need only 1 type of treatment.  Lab work: If your blood work, such as your electrolytes (potassium, magnesium) or your thyroid function tests, are abnormal, your caregiver will treat them accordingly.  Medication:  There are several different types of medications that can convert your heart to a normal rhythm and prevent atrial flutter from reoccurring.  Nonsurgical procedures: Nonsurgical techniques may be used to control atrial flutter. Some examples include:  Cardioversion. This technique uses either drugs or an electrical shock to restore a normal heart rhythm:  Cardioversion drugs may be given through an intravenous (IV) line to help "reset" the heart rhythm.  In electrical cardioversion, your caregiver shocks your heart with electrical energy. This helps to reset the heartbeat to a normal rhythm.  Ablation. If  atrial flutter is a persistent problem, an ablation may be needed. This procedure is done under mild sedation. High frequency radio-wave energy is used to destroy the area of heart tissue responsible for  atrial flutter. SEEK IMMEDIATE MEDICAL CARE IF:  You have:  Dizziness.  Near fainting or fainting.  Shortness of breath.  Chest pain or pressure.  Sudden nausea or vomiting.  Profuse sweating. If you have the above symptoms, call your local emergency service immediately! Do not drive yourself to the hospital. MAKE SURE YOU:   Understand these instructions.  Will watch your condition.  Will get help right away if you are not doing well or get worse.   This information is not intended to replace advice given to you by your health care provider. Make sure you discuss any questions you have with your health care provider.   Document Released: 03/08/2009 Document Revised: 11/10/2014 Document Reviewed: 05/04/2015 Elsevier Interactive Patient Education 2016 [employer].  Hospital doctor cardioversion is the delivery of a jolt of electricity to change the rhythm of the heart. Sticky patches or metal paddles are placed on the chest to deliver the electricity from a device. This is done to restore a normal rhythm. A rhythm that is too fast or not regular keeps the heart from pumping well. Electrical cardioversion is done in an emergency if:   There is low or no blood pressure as a result of the heart rhythm.   Normal rhythm must be restored as fast as possible to protect the brain and heart from further damage.   It may save a life. Cardioversion may be done for heart rhythms that are not immediately life threatening, such as atrial fibrillation or flutter, in which:   The heart is beating too fast or is not regular.   Medicine to change the rhythm has not worked.   It is safe to wait in order to allow time for preparation.  Symptoms of the abnormal rhythm are bothersome.  The risk of stroke and other serious problems can be reduced. LET [facility] CARE PROVIDER KNOW ABOUT:   Any allergies you have.  All medicines you are taking, including  vitamins, herbs, eye drops, creams, and over-the-counter medicines.  Previous problems you or members of your family have had with the use of anesthetics.   Any blood disorders you have.   Previous surgeries you have had.   Medical conditions you have. RISKS AND COMPLICATIONS  Generally, this is a safe procedure. However, problems can occur and include:   Breathing problems related to the anesthetic used.  A blood clot that breaks free and travels to other parts of your body. This could cause a stroke or other problems. The risk of this is lowered by use of blood-thinning medicine (anticoagulant) prior to the procedure.  Cardiac arrest (rare). BEFORE THE PROCEDURE   You may have tests to detect blood clots in your heart and to evaluate heart function.  You may start taking anticoagulants so your blood does not clot as easily.   Medicines may be given to help stabilize your heart rate and rhythm. PROCEDURE  You will be given medicine through an IV tube to reduce discomfort and make you sleepy (sedative).   An electrical shock will be delivered. AFTER THE PROCEDURE Your heart rhythm will be watched to make sure it does not change.    This information is not intended to replace advice given to you by your health care provider. Make sure you discuss  any questions you have with your health care provider.   Document Released: 10/10/2002 Document Revised: 11/10/2014 Document Reviewed: 05/04/2013 Elsevier Interactive Patient Education [employer].

## 2016-03-21 NOTE — ED Provider Notes (Signed)
I saw and evaluated the patient, reviewed the resident's note and I agree with the findings and plan.  Pertinent History: The pt is a 70 y/o male - presented from the cardiology office with c/o palpitations and hx of aflutter from office - sent for cardioversion / treatment of same - pt has no CP but has palpitations  Pertinent Exam findings: On exam the pt has aflutter - has strong pulses at radial arteries and no peripheral edema or JVD. Lungs are clear to auscultation, abdomen is soft and the patient is in no distress.  EKG confirms atrial flutter with 2:1 block, discussed with cardiology who agrees with cardioversion, the patient was informed of the risks benefits alternatives and was in agreement to move forward, please see resident note for procedure which was successful on first attempt. Recheck of the patient, normal sinus rhythm  8:41 PM, the patient was reevaluated after cardioversion, he continues to have a pulse between 58 and 62 bpm, his blood pressure is normal, his mental status is normal and his neurologic exam shows no lateralizing weakness numbness or facial droop. He feels better, there is no focal neurologic symptoms, we'll reevaluate again prior to discharge.  Procedural sedation Performed by: Johnna Acosta Consent: Verbal consent obtained. Risks and benefits: risks, benefits and alternatives were discussed Required items: required blood products, implants, devices, and special equipment available Patient identity confirmed: arm band and provided demographic data Time out: Immediately prior to procedure a "time out" was called to verify the correct patient, procedure, equipment, support staff and site/side marked as required.  Sedation type: moderate (conscious) sedation NPO time confirmed and considedered  Sedatives: Versed / Fentanyl  Physician Time at Bedside: 15 minutes  Vitals: Vital signs were monitored during sedation. Cardiac Monitor, pulse oximeter Patient  tolerance: Patient tolerated the procedure well with no immediate complications. Comments: Pt with uneventful recovered. Returned to pre-procedural sedation baseline     I was personally present and directly supervised the following procedures:  Cardioversion   EKG Interpretation  Date/Time:  Friday Mar 21 2016 20:06:45 EDT Ventricular Rate:  60 PR Interval:  161 QRS Duration: 116 QT Interval:  394 QTC Calculation: 394 R Axis:   -9 Text Interpretation:  Sinus rhythm Nonspecific intraventricular conduction delay Confirmed by RAY MD, DANIELLE [phone]) on 03/22/2016 6:53:09 PM       Final diagnoses:  Atrial flutter, unspecified type ([place])  Encounter for cardioversion procedure      Noemi Chapel, MD 03/23/16 1448

## 2016-03-21 NOTE — Sedation Documentation (Addendum)
Pt syncronyzed cardioverted @ 143mJ.

## 2016-03-21 NOTE — Telephone Encounter (Signed)
Per pt call he is in Afib and needs a call back  ASAP to find out what to do.

## 2016-03-21 NOTE — ED Provider Notes (Signed)
CSN: [id number]     Arrival date & time 03/21/16  1754 History   First MD Initiated Contact with Patient 03/21/16 1816     Chief Complaint  Patient presents with  . Palpitations     (Consider location/radiation/quality/duration/timing/severity/associated sxs/prior Treatment) HPI [name] is a 70 year old male with past medical history of PTSD, hypertension, hyperlipidemia, CAD s/p stent in 2006, previous episode of atrial fibrillation, presents from outpatient cardiology office with new onset atrial flutter. Patient reports that shortly after waking up this morning he felt heart palpitations which he describes as hard and fast. These were intermittent initially, but have been constant since around lunchtime. He checked his blood pressure several times and it was stable. Currently he is only on low-dose aspirin for anticoagulation.   Patient was evaluated in his cardiologist's office (Dr. [name]), who found patient to be in atrial flutter. Dr. [name] was concerned about up titrating his medications due to history of slow ventricular rhythms in the past. Patient was started on Xarelto for anticoagulation and instructed to present to the emergency department for electrical cardioversion. Patient reports mild fatigue, but denies chest pain, shortness of breath, dizziness or any other associated symptoms. He continues to feel heart palpitations. Denies recent caffeine use, over-the-counter stimulants or other new medications. Denies recent fevers or other recent illness.   Past Medical History  Diagnosis Date  . Hypertension   . Hyperlipidemia     VSD  . GERD (gastroesophageal reflux disease)   . CAD (coronary artery disease)   . Atrial fibrillation [facility])    Past Surgical History  Procedure Laterality Date  . Cardiac catheterization    . Left heart catheterization with coronary angiogram N/A 08/14/2014    Procedure: LEFT HEART CATHETERIZATION WITH CORONARY ANGIOGRAM;  Surgeon: Birdie Riddle,  MD;  Location: [place] CATH LAB;  Service: Cardiovascular;  Laterality: N/A;  . Tonsillectomy  1955  . Shoulder surgery     Family History  Problem Relation Age of Onset  . Pancreatic cancer Mother   . Aortic aneurysm Maternal Grandmother   . Heart attack Maternal Grandfather   . Diabetes Paternal Grandfather   . CAD Paternal Grandfather    Social History  Substance Use Topics  . Smoking status: Never Smoker   . Smokeless tobacco: Never Used  . Alcohol Use: No    Review of Systems  Constitutional: Positive for fatigue. Negative for fever and chills.  HENT: Negative for congestion and rhinorrhea.   Eyes: Negative for visual disturbance.  Cardiovascular: Positive for palpitations. Negative for chest pain and leg swelling.  Gastrointestinal: Negative for nausea, vomiting, abdominal pain and diarrhea.  Genitourinary: Negative for dysuria and difficulty urinating.  Musculoskeletal: Negative for back pain and neck pain.  Skin: Negative for pallor and rash.  Neurological: Negative for dizziness and headaches.  Psychiatric/Behavioral: Negative for confusion.      Allergies  Neosporin; Percocet; and Vicodin  Home Medications   Prior to Admission medications   Medication Sig Start Date End Date Taking? Authorizing Provider  amoxicillin-clavulanate (AUGMENTIN) 875-125 MG tablet Take 1 tablet by mouth every 12 (twelve) hours as needed (cough). 11/29/15   [name], MD  aspirin 81 MG tablet Take 81 mg by mouth daily.    Historical Provider, MD  atorvastatin (LIPITOR) 80 MG tablet Take 1 tablet (80 mg total) by mouth daily at 6 PM. 11/16/15   [name], MD  b complex vitamins capsule Take 1 capsule by mouth daily.    Historical  Provider, MD  cholecalciferol (VITAMIN D) 1000 UNITS tablet Take 1,000 Units by mouth daily.     Historical Provider, MD  COLCRYS 0.6 MG tablet Take 0.6 mg by mouth daily.  05/26/14   Historical Provider, MD  folic acid (FOLVITE) A999333 MCG tablet Take 800  mcg by mouth 2 (two) times daily.    Historical Provider, MD  hydroxypropyl methylcellulose / hypromellose (ISOPTO TEARS / GONIOVISC) 2.5 % ophthalmic solution Place 1 drop into both eyes 4 (four) times daily.    Historical Provider, MD  loratadine (CLARITIN) 10 MG tablet Take 10 mg by mouth daily as needed for allergies.    Historical Provider, MD  losartan (COZAAR) 100 MG tablet Take 100 mg by mouth daily.  05/26/14   Historical Provider, MD  metoprolol tartrate (LOPRESSOR) 25 MG tablet Take 0.5 tablets (12.5 mg total) by mouth 2 (two) times daily. 06/27/15   [name], MD  nitroGLYCERIN (NITROSTAT) 0.4 MG SL tablet Place 1 tablet (0.4 mg total) under the tongue every 5 (five) minutes as needed for chest pain. Max 3 doses. 01/03/15   [name], MD  Omega-3 Fat Ac-Cholecalciferol (DRY EYE OMEGA BENEFITS/VIT D-3 PO) Take 4 capsules by mouth daily. Patient takes 4 daily, spreads them out throughout the day    Historical Provider, MD  PARoxetine (PAXIL) 20 MG tablet Take 0.5 tablets (10 mg total) by mouth every other day. 05/31/15   [name], MD  PATADAY 0.2 % SOLN Place 1 drop into both eyes every evening.  08/10/14   Historical Provider, MD  ranitidine (ZANTAC) 150 MG tablet Take 150 mg by mouth 2 (two) times daily.    Historical Provider, MD  rivaroxaban (XARELTO) 20 MG TABS tablet Take 1 tablet (20 mg total) by mouth daily with supper. 03/21/16   [name], MD   BP 142/104 mmHg  Pulse 134  Temp(Src) 98.4 F (36.9 C) (Oral)  Resp 16  SpO2 95% Physical Exam  Constitutional: He is oriented to person, place, and time. He appears well-developed and well-nourished.  HENT:  Head: Normocephalic and atraumatic.  Eyes: EOM are normal. Pupils are equal, round, and reactive to light.  Neck: Normal range of motion. Neck supple.  Cardiovascular: Normal rate and intact distal pulses.  A regularly irregular rhythm present.  Pulmonary/Chest: Effort normal and breath sounds normal. No  respiratory distress.  Abdominal: Soft. He exhibits no distension. There is no tenderness.  Musculoskeletal: Normal range of motion. He exhibits no edema or tenderness.  Neurological: He is alert and oriented to person, place, and time.  Skin: Skin is warm and dry. No rash noted.  Psychiatric: He has a normal mood and affect.  Nursing note and vitals reviewed.   ED Course  .Cardioversion Date/Time: 03/21/2016 8:09 PM Performed by: Tamala Julian, Kacelyn Rowzee B Authorized by: Darnelle Bos B Consent: Verbal consent obtained. Written consent obtained. Risks and benefits: risks, benefits and alternatives were discussed Consent given by: patient Patient understanding: patient states understanding of the procedure being performed Patient consent: the patient's understanding of the procedure matches consent given Procedure consent: procedure consent matches procedure scheduled Relevant documents: relevant documents present and verified Test results: test results available and properly labeled Imaging studies: imaging studies available Required items: required blood products, implants, devices, and special equipment available Patient identity confirmed: verbally with patient Time out: Immediately prior to procedure a "time out" was called to verify the correct patient, procedure, equipment, support staff and site/side marked as required. Patient sedated:  yes Sedatives: fentanyl and lorazepam Cardioversion basis: elective Indications: failure of anti-arrhythmic medications Indications comments: history of bradyarrythmias Pre-procedure rhythm: atrial flutter Patient position: patient was placed in a supine position Chest area: chest area exposed Electrodes: pads Electrodes placed: anterior-posterior Number of attempts: 1 Attempt 1 mode: synchronous Attempt 1 shock (in Joules): 100 Attempt 1 outcome: conversion to normal sinus rhythm Post-procedure rhythm: normal sinus rhythm Complications: no  complications Patient tolerance: Patient tolerated the procedure well with no immediate complications   (including critical care time) Labs Review Labs Reviewed  BASIC METABOLIC PANEL - Abnormal; Notable for the following:    Glucose, Bld 101 (*)    All other components within normal limits  CBC - Abnormal; Notable for the following:    RBC 5.93 (*)    All other components within normal limits  I-STAT TROPOININ, ED    Imaging Review No results found. I have personally reviewed and evaluated these images and lab results as part of my medical decision-making.   EKG Interpretation   Date/Time:  Friday Mar 21 2016 17:59:55 EDT Ventricular Rate:  134 PR Interval:  170 QRS Duration: 146 QT Interval:  378 QTC Calculation: 564 R Axis:   -34 Text Interpretation:  Sinus tachycardia  vs 2:1 A flutter Left axis  deviation Right bundle branch block T wave abnormality, consider  inferolateral ischemia Abnormal ECG Since last tracing rate faster  Confirmed by MILLER  MD, BRIAN ([id number]) on 03/21/2016 6:39:16 PM      MDM   Final diagnoses:  Atrial flutter, unspecified type ([place])  Encounter for cardioversion procedure    Patient is a 70 year old male who presents from cardiology clinic for cardioversion of atrial flutter.   On presentation patient remains in atrial flutter with a stable right in the mid 130s. He is otherwise asymptomatic. Mildly hypertensive. No JVD, rales, LE edema, or calf tenderness on exam. Patient had received Xarelto in clinic prior to arrival.   Labs including CBC, CMP and point of care troponin are unremarkable. Chest x-ray does not reveal cardiomegaly or any other active disease.   Patient was discussed with cardiology on-call who recommended cardioversion. If patient is successfully cardioverted he will be stable for discharge home from cardiology standpoint. He can be closely followed by Dr. [name]. Recommend continuing Xarelto and continue metoprolol for rate  control. The patient's heart rate is >60 recommend increase in metoprolol to 25 mg twice a day.  Patient was consented for sedation and synchronized cardioversion. Sedation was provided with fentanyl and Versed, which patient tolerated well. Patient was cardioverted successfully after shock x1. Post cardioversion EKG shows sinus rhythm. Please see documentation above. On multiple re-evaluations following cardioversion patient was in sinus rhythm with a normal neurologic exam and no focal deficits. He is tolerating by mouth and ambulating without assistance. Patient was discharged in stable condition. Will continue Xarelto. As patient's heart rate is mainly in the upper 50s will defer increasing metoprolol at this time. Advised patient to continue metoprolol 12.5 twice daily as per his current regimen. Advised patient to call Dr. [name] on Monday to schedule follow-up. Strict return precautions were discussed and patient and wife are in agreement with plan.  This patient was seen with, cardioversion supervised by, and MDM discussed with Dr. Sabra Heck, ED attending.     Gibson Ramp, MD 03/22/16 [id number]  Noemi Chapel, MD 03/23/16 [phone]

## 2016-03-21 NOTE — Progress Notes (Signed)
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 This is never caused issues in fact she's had right and left heart catheterizations dating back to the 1970s. 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 Other coronary risk factors include hypertension, dyslipidemia and family history of heart disease. He was sent for cardiac catheterization in 2006 for follow-up of VSD, at which time he had an echocardiogram which showed mild aortic regurgitation, a small membranous VSD and left atrial enlargement with an EF of 70%. He had a stress test at that time which showed findings concerning for him for a septal and inferoapical ischemia. He therefore was referred to left heart catheterization with strep demonstrated a 50-70% mid LAD stenosis and mild disease of the mid circumflex and first obtuse marginal branches. He's been managed medically since that time and previously saw Dr. Daneen Schick.  Recently he's been having some symptoms of sweating, palpitations and dizziness and eventually was found to be in atrial fibrillation when he presented to the hospital. At that time his troponin was elevated. He is placed on diltiazem which slowed his heart rate and he converted back to sinus spontaneously. Based on his elevated troponin, he was referred for left heart catheterization by Dr. Doylene Canard. This demonstrated a 60-70% mid LAD stenosis and a 90% obtuse marginal stenosis. Subsequently he underwent coronary intervention with placement of a 2.5 x 15 mm Xience Alpine stent to the mid OM. After this he became asymptomatic. He was going to have stress testing to determine the significance of his mid LAD lesion however it appears to be not significantly changed since his  heart catheterization 2006. He reports no further symptoms of atrial fibrillation but is concerned about bradycardia. He was not referred for cardiac rehabilitation due to the thought that he may need additional intervention to the LAD.  Jose Stone returns today for follow-up. She's done quite well in cardiac rehabilitation. At his last office visit we discontinued his diltiazem for bradycardia. He still has a low heart rate which has some degree of chronotropic incompetence. He reports no recurrent atrial fibrillation that were aware of. He continues on dual antiplatelet therapy. He had a stress test prior to enrolling in cardiac redilatation which was negative for ischemia.  I saw Jose Stone back in the office today. He is accompanied by his wife who was a cardiac nurse at the [employer] in [place] for a number years in the 1970s. They brought in pronounced of his heart rate monitor and had several questions about his heart rate and arrhythmias. My interpretation of the monitor indicates that he had some episodes of short bursts of atrial tachycardia but no evidence for A. fib. He also has PACs. Heart rate however was a low and remained that way despite stopping his calcium channel blocker. He was recently in the emergency room and was noted to have some paroxysmal tachycardia in the ER with a right bundle-branch pattern. This is what led to monitor placement. He reports fatigue which could be certainly related to his lower heart rate. He also says that he's developed gynecomastia which he relates to [place]. I've done a search on the medicine and do not see that however he reports  in the product labeling.  03/21/2016  Jose Stone was seen today in the office for an acute visit. He noted this morning shortly after waking up his heart rate was elevated in the low 100s. It improved somewhat and then around 9:00 it increased up to about 130. The heart rate is been stable since then and he called in for a new acute  triage visit. He reports feeling somewhat fatigued and noting that his heart rate is elevated. He denies any worsening shortness of breath or chest pain. EKG in the office shows atrial flutter with a regular rhythm at 131 bpm. As per recent notes he was previous on aspirin and clopidogrel but the caput ago was stopped. He was not on triple therapy. Currently he is only on low-dose aspirin. 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 I'm concerned about up titrating his medications because she's had history of slow ventricular rates in the past. We discussed a number of options but ultimately the best option is for him to undergo cardioversion to get back into normal rhythm.  PMHx:  Past Medical History  Diagnosis Date  . Hypertension   . Hyperlipidemia     VSD  . GERD (gastroesophageal reflux disease)   . CAD (coronary artery disease)     Past Surgical History  Procedure Laterality Date  . Cardiac catheterization    . Left heart catheterization with coronary angiogram N/A 08/14/2014    Procedure: LEFT HEART CATHETERIZATION WITH CORONARY ANGIOGRAM;  Surgeon: Birdie Riddle, MD;  Location: [place] CATH LAB;  Service: Cardiovascular;  Laterality: N/A;  . Tonsillectomy  1955  . Shoulder surgery      FAMHx:  Family History  Problem Relation Age of Onset  . Pancreatic cancer Mother   . Aortic aneurysm Maternal Grandmother   . Heart attack Maternal Grandfather   . Diabetes Paternal Grandfather   . CAD Paternal Grandfather     SOCHx:   reports that he has never smoked. He has never used smokeless tobacco. He reports that he does not drink alcohol or use illicit drugs.  ALLERGIES:  Allergies  Allergen Reactions  . Neosporin [Neomycin-Bacitracin Zn-Polymyx] Rash  . Percocet [Oxycodone-Acetaminophen] Rash    Patient can tolerate acetaminophen solely  . Vicodin [Hydrocodone-Acetaminophen] Rash    Patient can tolerate acetaminophen solely    ROS: Pertinent  items noted in HPI and remainder of comprehensive ROS otherwise negative.  HOME MEDS: Current Outpatient Prescriptions  Medication Sig Dispense Refill  . amoxicillin-clavulanate (AUGMENTIN) 875-125 MG tablet Take 1 tablet by mouth every 12 (twelve) hours as needed (cough). 14 tablet 0  . aspirin 81 MG tablet Take 81 mg by mouth daily.    Marland Kitchen atorvastatin (LIPITOR) 80 MG tablet Take 1 tablet (80 mg total) by mouth daily at 6 PM. 90 tablet 1  . b complex vitamins capsule Take 1 capsule by mouth daily.    . cholecalciferol (VITAMIN D) 1000 UNITS tablet Take 1,000 Units by mouth daily.     Marland Kitchen COLCRYS 0.6 MG tablet Take 0.6 mg by mouth daily.     . folic acid (FOLVITE) A999333 MCG tablet Take 800 mcg by mouth 2 (two) times daily.    . hydroxypropyl methylcellulose / hypromellose (ISOPTO TEARS / GONIOVISC) 2.5 % ophthalmic solution Place 1 drop into both eyes 4 (four) times daily.    Marland Kitchen loratadine (CLARITIN) 10 MG tablet Take 10 mg by mouth daily as needed for allergies.    Marland Kitchen losartan (  COZAAR) 100 MG tablet Take 100 mg by mouth daily.     . metoprolol tartrate (LOPRESSOR) 25 MG tablet Take 0.5 tablets (12.5 mg total) by mouth 2 (two) times daily. 90 tablet 3  . nitroGLYCERIN (NITROSTAT) 0.4 MG SL tablet Place 1 tablet (0.4 mg total) under the tongue every 5 (five) minutes as needed for chest pain. Max 3 doses. 25 tablet 3  . Omega-3 Fat Ac-Cholecalciferol (DRY EYE OMEGA BENEFITS/VIT D-3 PO) Take 4 capsules by mouth daily. Patient takes 4 daily, spreads them out throughout the day    . PARoxetine (PAXIL) 20 MG tablet Take 0.5 tablets (10 mg total) by mouth every other day. 30 tablet 0  . PATADAY 0.2 % SOLN Place 1 drop into both eyes every evening.     . ranitidine (ZANTAC) 150 MG tablet Take 150 mg by mouth 2 (two) times daily.    . rivaroxaban (XARELTO) 20 MG TABS tablet Take 1 tablet (20 mg total) by mouth daily with supper. 30 tablet 9   No current facility-administered medications for this visit.     LABS/IMAGING: No results found for this or any previous visit (from the past 48 hour(s)). No results found.  VITALS: BP 132/97 mmHg  Pulse 131  Ht 6\' 2"  (1.88 m)  Wt 243 lb 9.6 oz (110.496 kg)  BMI 31.26 kg/m2  EXAM: General appearance: alert and no distress Neck: no carotid bruit and no JVD Lungs: clear to auscultation bilaterally Heart: regular rate and rhythm, S1, S2 normal, systolic murmur: holosystolic 3/6, blowing at lower left sternal border and tachycardia Abdomen: soft, non-tender; bowel sounds normal; no masses,  no organomegaly Extremities: extremities normal, atraumatic, no cyanosis or edema Pulses: 2+ and symmetric Skin: Skin color, texture, turgor normal. No rashes or lesions Neurologic: Grossly normal Psych: Pleasant  EKG: Atrial flutter at 131  ASSESSMENT: 1. Atrial flutter with rapid ventricular response 2. Coronary artery disease status post PCI to the OM with a Xience Alpine DES (2.515 mm) 3. Residual moderate to severe mid LAD disease 4. Hypertension 5. Dyslipidemia 6. A. fib with RVR-spontaneously converted, possibly related to ischemia (not on triple anticoagulation) 7. Small perimembranous VSD 8. Fatigue-possibly symptomatic bradycardia  PLAN: 1.   Jose Stone is in new onset atrial flutter with rapid ventricular response. This is been stable for most of today and present since about 9:00 AM. He has is well-documented. We discussed management options however I feel that elective cardioversion his prior the best way to establish normal rhythm. The most expedient way is for protocol driven cardioversion in the emergency department. He will need to be anticoagulated. I recommended starting Xarelto 20 mg daily. We've provided him with a samples and he took a 20 mg Xarelto dose in the office prior to going to the emergency department. I called emergency department and spoke with the triage nurse indicating that he was on his way in for evaluation and  possible elective cardioversion. Hopefully he can be converted to sinus rhythm and discharged thereafter. I'm happy to see him back soon in follow-up.  [name], MD, [facility] Attending Cardiologist [place] 03/21/2016, 5:40 PM

## 2016-03-21 NOTE — ED Notes (Signed)
Pt here from cardiology office for aflutter starting this am; pt sts hx of afib; pt sts some palpitations but denies pain

## 2016-03-21 NOTE — Telephone Encounter (Signed)
Returned call to pt. He states he feels like he's in a fib again. His BP this morning 130/85.  Pulse has been irregular. His wife got readings of HR 105, 65, and 120.  Pt does feel some palpitations but denies CP or SOB. Patient is wanting to get checked.  Taken to Dr [name], DOD, for advice. He advised that pt did not need to go to the ED at this time as long as the heart rate is not sustained and to come in for 4:15 appt.  This advice taken to pt. Pt states his heart rate is not constantly elevated but is irregular. Pt verbalized understanding to come to appt at 4:15. Pt verbalized understanding to go to the emergency room or call 911 if his pulse becomes sustained elevated or he develops any other symptoms, such as SOB, nausea, vomiting, or sweating.

## 2016-04-06 IMAGING — CR DG CHEST 1V PORT
1 series · 1 of 1 positions shown · non-contrast
Comparison: None.

CLINICAL DATA: Chest pain

EXAM:
PORTABLE CHEST - 1 VIEW

[AP]
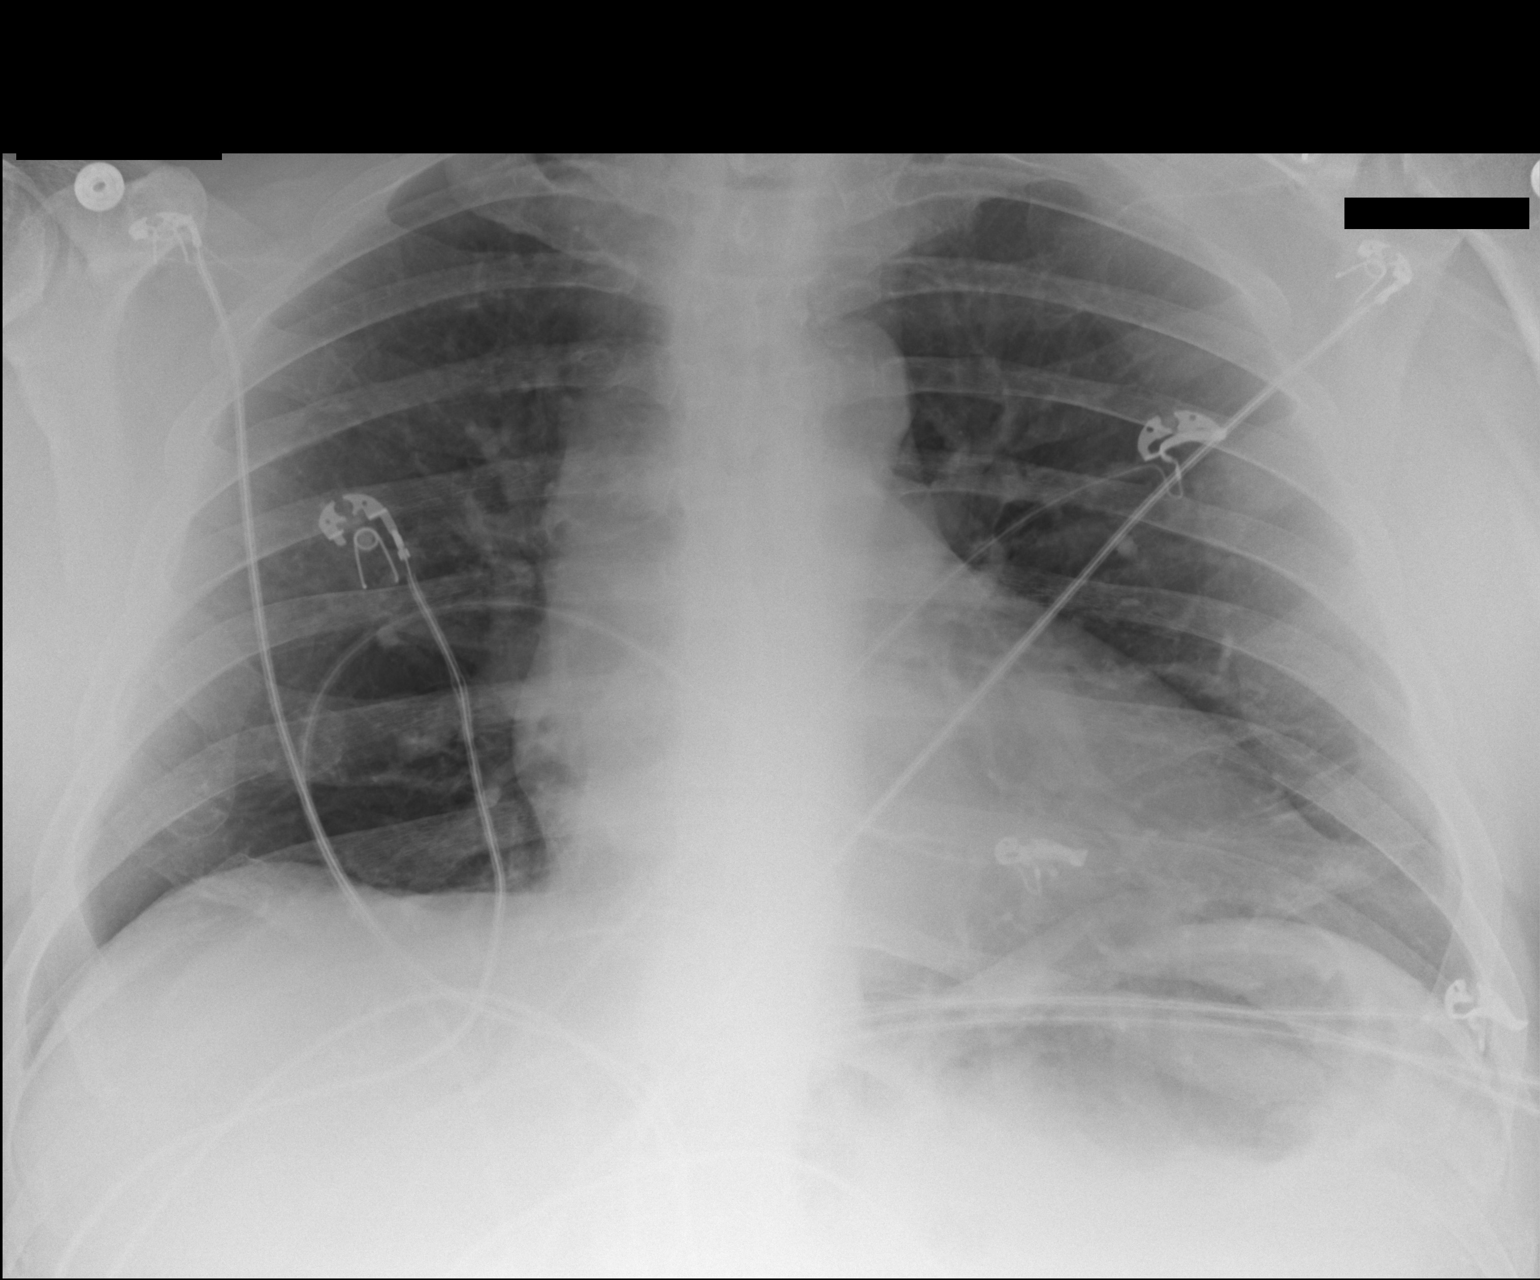

[1 of 1 positions shown; findings below may reference images not displayed]

FINDINGS: The mediastinal contour is normal. The heart size is enlarged. There
is no focal infiltrate, pulmonary edema, or pleural effusion. Vessel
on end is identified in bilateral perihilar region. The visualized
skeletal structures are unremarkable.
IMPRESSION: No active cardiopulmonary disease.

## 2016-04-17 ENCOUNTER — Encounter: Payer: Self-pay | Admitting: Internal Medicine

## 2016-04-17 ENCOUNTER — Ambulatory Visit (INDEPENDENT_AMBULATORY_CARE_PROVIDER_SITE_OTHER): Payer: Medicare Other | Admitting: Internal Medicine

## 2016-04-17 VITALS — BP 134/82 | HR 57 | Ht 73.0 in | Wt 240.2 lb

## 2016-04-17 DIAGNOSIS — I4891 Unspecified atrial fibrillation: Secondary | ICD-10-CM

## 2016-04-17 DIAGNOSIS — Q210 Ventricular septal defect: Secondary | ICD-10-CM

## 2016-04-17 DIAGNOSIS — G4733 Obstructive sleep apnea (adult) (pediatric): Secondary | ICD-10-CM | POA: Diagnosis not present

## 2016-04-17 DIAGNOSIS — Z9989 Dependence on other enabling machines and devices: Principal | ICD-10-CM

## 2016-04-17 DIAGNOSIS — I4510 Unspecified right bundle-branch block: Secondary | ICD-10-CM

## 2016-04-17 DIAGNOSIS — I2583 Coronary atherosclerosis due to lipid rich plaque: Secondary | ICD-10-CM

## 2016-04-17 DIAGNOSIS — G4719 Other hypersomnia: Secondary | ICD-10-CM

## 2016-04-17 DIAGNOSIS — I2510 Atherosclerotic heart disease of native coronary artery without angina pectoris: Secondary | ICD-10-CM | POA: Diagnosis not present

## 2016-04-17 NOTE — Progress Notes (Signed)
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 This is never caused issues in fact she's had right and left heart catheterizations dating back to the 1970s. 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 Other coronary risk factors include hypertension, dyslipidemia and family history of heart disease. He was sent for cardiac catheterization in 2006 for follow-up of VSD, at which time he had an echocardiogram which showed mild aortic regurgitation, a small membranous VSD and left atrial enlargement with an EF of 70%. He had a stress test at that time which showed findings concerning for him for a septal and inferoapical ischemia. He therefore was referred to left heart catheterization with strep demonstrated a 50-70% mid LAD stenosis and mild disease of the mid circumflex and first obtuse marginal branches. He's been managed medically since that time and previously saw Dr. Daneen Schick.  Recently he's been having some symptoms of sweating, palpitations and dizziness and eventually was found to be in atrial fibrillation when he presented to the hospital. At that time his troponin was elevated. He is placed on diltiazem which slowed his heart rate and he converted back to sinus spontaneously. Based on his elevated troponin, he was referred for left heart catheterization by Dr. Doylene Canard. This demonstrated a 60-70% mid LAD stenosis and a 90% obtuse marginal stenosis. Subsequently he underwent coronary intervention with placement of a 2.5 x 15 mm Xience Alpine stent to the mid OM. After this he became asymptomatic. He was going to have stress testing to determine the significance of his mid LAD lesion however it appears to be not significantly changed  since his heart catheterization 2006. He reports no further symptoms of atrial fibrillation but is concerned about bradycardia. He was not referred for cardiac rehabilitation due to the thought that he may need additional intervention to the LAD.  [name] returns today for follow-up. She's done quite well in cardiac rehabilitation. At his last office visit we discontinued his diltiazem for bradycardia. He still has a low heart rate which has some degree of chronotropic incompetence. He reports no recurrent atrial fibrillation that were aware of. He continues on dual antiplatelet therapy. He had a stress test prior to enrolling in cardiac redilatation which was negative for ischemia.  I saw [name] back in the office today. He is accompanied by his wife who was a cardiac nurse at the [employer] in [place] for a number years in the 1970s. They brought in pronounced of his heart rate monitor and had several questions about his heart rate and arrhythmias. My interpretation of the monitor indicates that he had some episodes of short bursts of atrial tachycardia but no evidence for A. fib. He also has PACs. Heart rate however was a low and remained that way despite stopping his calcium channel blocker. He was recently in the emergency room and was noted to have some paroxysmal tachycardia in the ER with a right bundle-branch pattern. This is what led to monitor placement. He reports fatigue which could be certainly related to his lower heart rate. He also says that he's developed gynecomastia which he relates to [place]. I've done a search on the medicine and do not see that however he  reports in the product labeling.  03/21/2016  [name] was seen today in the office for an acute visit. He noted this morning shortly after waking up his heart rate was elevated in the low 100s. It improved somewhat and then around 9:00 it increased up to about 130. The heart rate is been stable since then and he called in for a  new acute triage visit. He reports feeling somewhat fatigued and noting that his heart rate is elevated. He denies any worsening shortness of breath or chest pain. EKG in the office shows atrial flutter with a regular rhythm at 131 bpm. As per recent notes he was previous on aspirin and clopidogrel but the caput ago was stopped. He was not on triple therapy. Currently he is only on low-dose aspirin. 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 I'm concerned about up titrating his medications because she's had history of slow ventricular rates in the past. We discussed a number of options but ultimately the best option is for him to undergo cardioversion to get back into normal rhythm.  04/17/2016  [name] was seen back in the office today for follow-up. At his last office visit he was noted to be in atrial flutter with a rate of 131. He was referred directly to the ER and underwent cardioversion after being started on Xarelto. He is remained on Xarelto since then without any bleeding problems. I'm happy to say he is maintaining sinus rhythm today. He does report significant fatigue however. He is mostly compliant with CPAP however he had a sleep study a number of years ago and he is set possibly to low of an airway setting. He's had weight gain since then. He also has been having some bradycardia with heart rates in the 40s and 50s. He is on low-dose beta blocker but I been hesitant to stop that because of his RVR. He does report heart rate increases with exercise.   PMHx:  Past Medical History  Diagnosis Date  . Hypertension   . Hyperlipidemia     VSD  . GERD (gastroesophageal reflux disease)   . CAD (coronary artery disease)   . Atrial fibrillation [facility])     Past Surgical History  Procedure Laterality Date  . Cardiac catheterization    . Left heart catheterization with coronary angiogram N/A 08/14/2014    Procedure: LEFT HEART CATHETERIZATION WITH CORONARY  ANGIOGRAM;  Surgeon: Birdie Riddle, MD;  Location: [facility] CATH [facility];  Service: Cardiovascular;  Laterality: N/A;  . Tonsillectomy  1955  . Shoulder surgery      FAMHx:  Family History  Problem Relation Age of Onset  . Pancreatic cancer Mother   . Aortic aneurysm Maternal Grandmother   . Heart attack Maternal Grandfather   . Diabetes Paternal Grandfather   . CAD Paternal Grandfather     SOCHx:   reports that he has never smoked. He has never used smokeless tobacco. He reports that he does not drink alcohol or use illicit drugs.  ALLERGIES:  Allergies  Allergen Reactions  . Neosporin [Neomycin-Bacitracin Zn-Polymyx] Rash  . Percocet [Oxycodone-Acetaminophen] Rash    Patient can tolerate acetaminophen solely  . Vicodin [Hydrocodone-Acetaminophen] Rash    Patient can tolerate acetaminophen solely    ROS: Pertinent items noted in HPI and remainder of comprehensive ROS otherwise negative.  HOME MEDS: Current Outpatient Prescriptions  Medication Sig Dispense Refill  . amoxicillin-clavulanate (AUGMENTIN) 875-125 MG tablet Take 1 tablet by mouth every 12 (twelve)  hours as needed (cough). 14 tablet 0  . aspirin 81 MG tablet Take 81 mg by mouth daily.    Marland Kitchen atorvastatin (LIPITOR) 80 MG tablet Take 1 tablet (80 mg total) by mouth daily at 6 PM. 90 tablet 1  . b complex vitamins capsule Take 1 capsule by mouth daily.    . cholecalciferol (VITAMIN D) 1000 UNITS tablet Take 1,000 Units by mouth daily.     Marland Kitchen COLCRYS 0.6 MG tablet Take 0.6 mg by mouth daily.     . folic acid (FOLVITE) A999333 MCG tablet Take 800 mcg by mouth 2 (two) times daily.    . hydroxypropyl methylcellulose / hypromellose (ISOPTO TEARS / GONIOVISC) 2.5 % ophthalmic solution Place 1 drop into both eyes 4 (four) times daily.    Marland Kitchen loratadine (CLARITIN) 10 MG tablet Take 10 mg by mouth daily as needed for allergies.    Marland Kitchen losartan (COZAAR) 100 MG tablet Take 100 mg by mouth daily.     . metoprolol tartrate (LOPRESSOR) 25 MG tablet  Take 0.5 tablets (12.5 mg total) by mouth 2 (two) times daily. 90 tablet 3  . nitroGLYCERIN (NITROSTAT) 0.4 MG SL tablet Place 1 tablet (0.4 mg total) under the tongue every 5 (five) minutes as needed for chest pain. Max 3 doses. 25 tablet 3  . Omega-3 Fat Ac-Cholecalciferol (DRY EYE OMEGA BENEFITS/VIT D-3 PO) Take 4 capsules by mouth daily. Patient takes 4 daily, spreads them out throughout the day    . PARoxetine (PAXIL) 20 MG tablet Take 0.5 tablets (10 mg total) by mouth every other day. 30 tablet 0  . PATADAY 0.2 % SOLN Place 1 drop into both eyes every evening.     . ranitidine (ZANTAC) 150 MG tablet Take 150 mg by mouth 2 (two) times daily.    . rivaroxaban (XARELTO) 20 MG TABS tablet Take 1 tablet (20 mg total) by mouth daily with supper. 30 tablet 9   No current facility-administered medications for this visit.    LABS/IMAGING: No results found for this or any previous visit (from the past 48 hour(s)). No results found.  VITALS: BP 134/82 mmHg  Pulse 57  Ht 6\' 1"  (1.854 m)  Wt 240 lb 3.2 oz (108.954 kg)  BMI 31.70 kg/m2  EXAM: General appearance: alert and no distress Neck: no carotid bruit and no JVD Lungs: clear to auscultation bilaterally Heart: regular rate and rhythm, S1, S2 normal, systolic murmur: holosystolic 3/6, blowing at lower left sternal border and tachycardia Abdomen: soft, non-tender; bowel sounds normal; no masses,  no organomegaly Extremities: extremities normal, atraumatic, no cyanosis or edema Pulses: 2+ and symmetric Skin: Skin color, texture, turgor normal. No rashes or lesions Neurologic: Grossly normal Psych: Pleasant  EKG: Sinus bradycardia with PACs at 51, nonspecific IVCD  ASSESSMENT: 1. Paroxysmal atrial flutter - CHADSVASC score of 1 on Xarelto 2. Coronary artery disease status post PCI to the OM with a Xience Alpine DES (2.515 mm) - on low dose Aspirin 3. Residual moderate to severe mid LAD  disease 4. Hypertension 5. Dyslipidemia 6. A. fib with RVR-spontaneously converted, possibly related to ischemia (not on triple anticoagulation) 7. Small perimembranous VSD 8. Fatigue-possibly symptomatic bradycardia  PLAN: 1.   Mr. Lewey Successfully was cardioverted in the ER and is maintained sinus rhythm since that time. He reports feeling fatigue, but is not clear if this is related to symptomatic bradycardia or perhaps poor sleep. He is using CPAP however his device is quite old and has not had  recent titration. He's had some weight gain since that time. I would recommend a repeat sleep study and he may need new equipment with auto titration capability or new settings. If his symptoms do not improve with this treatment or he is not found to have any significant worsening of apnea, then we may consider discontinuing or reducing the dose of his beta blocker which is a ready a low dose. My concern is that this may provoke recurrent rapid atrial fibrillation. For now I would like him to stay on Xarelto and low-dose aspirin given his history of coronary disease.  Follow-up with me in 2 months.  [name], MD, [facility] Attending Cardiologist [place] 04/17/2016, 1:55 PM

## 2016-04-17 NOTE — Patient Instructions (Signed)
Your physician has recommended that you have a sleep study (split night) @ [facility]. This test records several body functions during sleep, including: brain activity, eye movement, oxygen and carbon dioxide blood levels, heart rate and rhythm, breathing rate and rhythm, the flow of air through your mouth and nose, snoring, body muscle movements, and chest and belly movement.  Your physician recommends that you schedule a follow-up appointment in: TWO MONTHS with Dr. [name]

## 2016-04-28 DIAGNOSIS — N400 Enlarged prostate without lower urinary tract symptoms: Secondary | ICD-10-CM | POA: Diagnosis not present

## 2016-04-28 DIAGNOSIS — N35010 Post-traumatic urethral stricture, male, meatal: Secondary | ICD-10-CM | POA: Diagnosis not present

## 2016-04-28 DIAGNOSIS — R3121 Asymptomatic microscopic hematuria: Secondary | ICD-10-CM | POA: Diagnosis not present

## 2016-04-29 ENCOUNTER — Telehealth: Payer: Self-pay | Admitting: Internal Medicine

## 2016-04-29 NOTE — Telephone Encounter (Signed)
Received records from [facility] for appointment on 06/20/16 with Dr [name].  Records given to [facility] (medical records) for Dr Lysbeth Penner schedule on 06/20/16. lp

## 2016-05-30 ENCOUNTER — Other Ambulatory Visit: Payer: Self-pay | Admitting: Internal Medicine

## 2016-06-05 ENCOUNTER — Ambulatory Visit (HOSPITAL_BASED_OUTPATIENT_CLINIC_OR_DEPARTMENT_OTHER): Payer: Medicare Other | Attending: Internal Medicine | Admitting: Cardiovascular Disease

## 2016-06-05 VITALS — Ht 73.0 in | Wt 232.0 lb

## 2016-06-05 DIAGNOSIS — R5383 Other fatigue: Secondary | ICD-10-CM | POA: Diagnosis not present

## 2016-06-05 DIAGNOSIS — Z7982 Long term (current) use of aspirin: Secondary | ICD-10-CM | POA: Diagnosis not present

## 2016-06-05 DIAGNOSIS — G4733 Obstructive sleep apnea (adult) (pediatric): Secondary | ICD-10-CM | POA: Insufficient documentation

## 2016-06-05 DIAGNOSIS — G478 Other sleep disorders: Secondary | ICD-10-CM | POA: Insufficient documentation

## 2016-06-05 DIAGNOSIS — I493 Ventricular premature depolarization: Secondary | ICD-10-CM | POA: Diagnosis not present

## 2016-06-05 DIAGNOSIS — Z9989 Dependence on other enabling machines and devices: Secondary | ICD-10-CM

## 2016-06-05 DIAGNOSIS — Z79899 Other long term (current) drug therapy: Secondary | ICD-10-CM | POA: Insufficient documentation

## 2016-06-05 DIAGNOSIS — I10 Essential (primary) hypertension: Secondary | ICD-10-CM | POA: Diagnosis not present

## 2016-06-05 DIAGNOSIS — G4719 Other hypersomnia: Secondary | ICD-10-CM | POA: Diagnosis not present

## 2016-06-05 DIAGNOSIS — R0683 Snoring: Secondary | ICD-10-CM | POA: Insufficient documentation

## 2016-06-05 DIAGNOSIS — G4761 Periodic limb movement disorder: Secondary | ICD-10-CM | POA: Diagnosis not present

## 2016-06-05 DIAGNOSIS — I4891 Unspecified atrial fibrillation: Secondary | ICD-10-CM

## 2016-06-12 NOTE — Procedures (Signed)
Patient Name: Jose Stone, Jose Stone Date: 06/05/2016 Gender: Male D.O.B: 05/25/1950 Age (years): 81 Referring Provider: Shelva Majestic MD, ABSM Height (inches): 71 Interpreting Physician: Shelva Majestic MD, ABSM Weight (lbs): 205 RPSGT: Gerhard Perches BMI: 29 MRN: [id number] Neck Size: 17.00  CLINICAL INFORMATION The patient is referred for a CPAP titration to treat sleep apnea.   Date of NPSG, Split Night or HST:  04/14/2016  AHI 12.8/h; RDI 14.0/h   SLEEP STUDY TECHNIQUE As per the AASM Manual for the Scoring of Sleep and Associated Events v2.3 (April 2016) with a hypopnea requiring 4% desaturations. The channels recorded and monitored were frontal, central and occipital EEG, electrooculogram (EOG), submentalis EMG (chin), nasal and oral airflow, thoracic and abdominal wall motion, anterior tibialis EMG, snore microphone, electrocardiogram, and pulse oximetry. Continuous positive airway pressure (CPAP) was initiated at the beginning of the study and titrated to treat sleep-disordered breathing.  MEDICATIONS amLODipine (NORVASC) 5 MG tablet Taking 01/11/16 -- Burtis Junes, NP Take 1 tablet (5 mg total) by mouth daily. aspirin EC 81 MG EC tablet Taking 08/25/15 -- Rogelia Mire, NP Take 1 tablet (81 mg total) by mouth daily. atorvastatin (LIPITOR) 80 MG tablet 02/04/16 -- Belva Crome, MD Take 1 tablet (80 mg total) by mouth daily at 6 PM. clonazePAM (KLONOPIN) 1 MG tablet Taking 11/12/13 -- Historical Provider, MD Notes:  Received from: External Pharmacy hydrOXYzine (VISTARIL) 25 MG capsule Taking 12/10/13 -- Historical Provider, MD Notes:  Received from: External Pharmacy KLOR-CON M10 10 MEQ tablet Taking 08/14/15 -- Historical Provider, MD Notes:  Received from: External Pharmacy Loratadine (CLARITIN) 10 MG CAPS Taking -- -- Historical Provider, MD losartan-hydrochlorothiazide Konrad Penta) 50-12.5 MG per tablet Taking 09/17/13 -- Historical Provider, MD Notes:   Received from: External Pharmacy nitroGLYCERIN (NITROSTAT) 0.4 MG SL tablet Taking 08/25/15 -- Rogelia Mire, NP Place 1 tablet (0.4 mg total) under the tongue every 5 (five) minutes x 3 doses as needed for chest pain. Omega-3 Fatty Acids (FISH OIL) 1000 MG CAPS Taking -- -- Historical Provider, MD PARoxetine (PAXIL) 40 MG tablet Taking 08/05/15 -- Historical Provider, MD Notes:  Received from: External Pharmacy saw palmetto 160 MG capsule Taking -- -- Historical Provider, MD ticagrelor (BRILINTA) 90 MG TABS tablet Taking 01/21/16 -- Rogelia Mire, NP Take 1 tablet (90 mg total) by mouth 2 (two) times daily. vitamin B-12 (CYANOCOBALAMIN) 100 MCG tablet  Medications administered by patient during sleep study : No sleep medicine administered.  TECHNICIAN COMMENTS Comments added by technician: none  Comments added by scorer: N/A  RESPIRATORY PARAMETERS Optimal PAP Pressure (cm): 13 AHI at Optimal Pressure (/hr): 0.0 Overall Minimal O2 (%): 89.00 Supine % at Optimal Pressure (%): 0 Minimal O2 at Optimal Pressure (%): 92.0    SLEEP ARCHITECTURE The study was initiated at 10:43:01 PM and ended at 5:14:37 AM. Sleep onset time was 35.7 minutes and the sleep efficiency was 82.2%. The total sleep time was 322.0 minutes. [place]akr after sleep onset (WASO) was 33.9 minutes. The patient spent 12.58% of the night in stage N1 sleep, 79.97% in stage N2 sleep, 0.00% in stage N3 and 7.45% in REM.Stage REM latency was 240.0 minutes Wake after sleep onset was 33.9. Alpha intrusion was absent. Supine sleep was 42.55%.  CARDIAC DATA The 2 lead EKG demonstrated sinus rhythm. The mean heart rate was 64.22 beats per minute. Other EKG findings include: None.  LEG MOVEMENT DATA The total Periodic Limb Movements of Sleep (PLMS) were 64. The PLMS index was  11.93. A PLMS index of <15 is considered normal in adults.  IMPRESSIONS - The optimal PAP pressure was 13 cm of water. - Central sleep apnea was  not noted during this titration (CAI = 1.7/h). - Mild oxygen desaturations were observed during this titration (min O2 = 89.00%). - The patient snored with Soft snoring volume during this titration study with resolution at 13 cm water presssure. - No cardiac abnormalities were observed during this study. - Mild periodic limb movements were observed during this study. Arousals associated with PLMs were rare.  DIAGNOSIS - Obstructive Sleep Apnea (327.23 [G47.33 ICD-10])  RECOMMENDATIONS - Recommend an initial trial of CPAP therapy with EPR of 3 at 13 cm H2O with heated humidification.  A Medium size Resmed Full Face Mask AirFit F20 mask was ued for the titration. - Avoid alcohol, sedatives and other CNS depressants that may worsen sleep apnea and disrupt normal sleep architecture. - Sleep hygiene should be reviewed to assess factors that may improve sleep quality. - Weight management and regular exercise should be initiated or continued. - Recommend a download be obtained in 30 days and sleep clinic evaluation.  [Electronically signed] 06/12/2016 09:45 PM  Shelva Majestic MD, Tomasa Hose Diplomate, American Board of Sleep Medicine  NPI: [id number] [place] PH: [phone]   FX: [phone] [place]

## 2016-06-12 NOTE — Procedures (Signed)
     Patient Name: Jose Stone, Jose Stone Date: 06/05/2016 Gender: Male D.O.B: 08-16-1946 Age (years): 70 Referring Provider: Nadean Corwin Hilty Height (inches): 73 Interpreting Physician: Shelva Majestic MD, ABSM Weight (lbs): 232 RPSGT: Laren Everts BMI: 31 MRN: [id number] Neck Size: 17.00  CLINICAL INFORMATION Sleep Study Type: NPSG Indication for sleep study: Excessive Daytime Sleepiness, Fatigue, Hypertension, OSA, Re-Evaluation, Snoring, Witnessed Apneas Epepiness Scoreworth Sleep: 4  SLEEP STUDY TECHNIQUE As per the AASM Manual for the Scoring of Sleep and Associated Events v2.3 (April 2016) with a hypopnea requiring 4% desaturations. The channels recorded and monitored were frontal, central and occipital EEG, electrooculogram (EOG), submentalis EMG (chin), nasal and oral airflow, thoracic and abdominal wall motion, anterior tibialis EMG, snore microphone, electrocardiogram, and pulse oximetry.  MEDICATIONS Patient's medications include: ASPIRIN, RANITIDINE. Medications self-administered by patient during sleep study : No sleep medicine administered.  SLEEP ARCHITECTURE The study was initiated at 10:24:51 PM and ended at 4:31:05 AM. Sleep onset time was 45.9 minutes and the sleep efficiency was 45.5%. The total sleep time was 166.5 minutes. Wake after sleep onset  (WASO) was 153.8 moinutes. Stage REM latency was 111.5 minutes. The patient spent 15.92% of the night in stage N1 sleep, 75.98% in stage N2 sleep, 0.00% in stage N3 and 8.11% in REM. Alpha intrusion was absent. Supine sleep was 10.21%.  RESPIRATORY PARAMETERS The overall apnea/hypopnea index (AHI) was 2.5 per hour. RDI was 6.5/h.  There were 0 total apneas, including 0 obstructive, 0 central and 0 mixed apneas. There were 7 hypopneas and 11 RERAs. The AHI during Stage REM sleep was 26.7 per hour. AHI while supine was 0.0 per hour. The mean oxygen saturation was 92.67%. The minimum SpO2 during sleep was  86.00%. Moderate snoring was noted during this study.  CARDIAC DATA The 2 lead EKG demonstrated sinus rhythm. The mean heart rate was 51.92 beats per minute. Other EKG findings include: PVCs.  LEG MOVEMENT DATA The total PLMS were 252 with a resulting PLMS index of 90.81. Associated arousal with leg movement index was 12.3 . IMPRESSIONS - Increased upper airway resistance (UARS) without overall sleep apnea (AHI = 2.5/h); however, there was moderate sleep apnea with REM sleep (AHI 26.7/h). - No significant central sleep apnea occurred during this study (CAI = 0.0/h). - Mild oxygen desaturation  to a nadir of 86.00%. - The patient snored with Moderate snoring volume. - EKG findings include PVCs. - Severe periodic limb movements of sleep occurred during the study. Associated arousals were significant.  DIAGNOSIS - Obstructive sleep apnea - Periodic limb movement disorder of sleep  RECOMMENDATIONS - At present patient does not meet criteria for CPAP therapy.  If progressive symptoms develop consider a follow-up study. - Efforts should be made to optimize nasal and oropharyngeal patency. - Consider alternatives for the treatment of snoring.  - If patient is symptomatic with restless legs, consider a trial of requip or another agent with a PLMS index of  90.81. - Avoid alcohol, sedatives and other CNS depressants that may worsen sleep apnea and disrupt normal sleep architecture. - Sleep hygiene should be reviewed to assess factors that may improve sleep quality. - Weight management and regular exercise should be initiated or continued if appropriate.   [Electronically signed] 06/12/2016 10:08 PM  Shelva Majestic MD, [facility], [place], American Board of Sleep Medicine   NPI: [id number] [place] PH: [phone]   FX: [phone] [place]

## 2016-06-20 ENCOUNTER — Ambulatory Visit (INDEPENDENT_AMBULATORY_CARE_PROVIDER_SITE_OTHER): Payer: Medicare Other | Admitting: Internal Medicine

## 2016-06-20 ENCOUNTER — Encounter: Payer: Self-pay | Admitting: Internal Medicine

## 2016-06-20 VITALS — BP 109/66 | HR 58 | Ht 73.0 in | Wt 240.0 lb

## 2016-06-20 DIAGNOSIS — R001 Bradycardia, unspecified: Secondary | ICD-10-CM

## 2016-06-20 DIAGNOSIS — I10 Essential (primary) hypertension: Secondary | ICD-10-CM

## 2016-06-20 DIAGNOSIS — I2510 Atherosclerotic heart disease of native coronary artery without angina pectoris: Secondary | ICD-10-CM

## 2016-06-20 DIAGNOSIS — I4891 Unspecified atrial fibrillation: Secondary | ICD-10-CM | POA: Diagnosis not present

## 2016-06-20 DIAGNOSIS — Z9989 Dependence on other enabling machines and devices: Secondary | ICD-10-CM

## 2016-06-20 DIAGNOSIS — G4733 Obstructive sleep apnea (adult) (pediatric): Secondary | ICD-10-CM

## 2016-06-20 DIAGNOSIS — I4510 Unspecified right bundle-branch block: Secondary | ICD-10-CM | POA: Insufficient documentation

## 2016-06-20 NOTE — Patient Instructions (Signed)
Your physician has recommended you make the following change in your medication: STOP metoprolol tartrate  Your physician recommends that you schedule a follow-up appointment in: [place] with Dr. [name]

## 2016-06-20 NOTE — Progress Notes (Signed)
OFFICE NOTE  Chief Complaint:  Fatigue, follow-up  Primary Care Physician: No primary care provider on file.  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 This is never caused issues in fact she's had right and left heart catheterizations dating back to the 1970s. 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 Other coronary risk factors include hypertension, dyslipidemia and family history of heart disease. He was sent for cardiac catheterization in 2006 for follow-up of VSD, at which time he had an echocardiogram which showed mild aortic regurgitation, a small membranous VSD and left atrial enlargement with an EF of 70%. He had a stress test at that time which showed findings concerning for him for a septal and inferoapical ischemia. He therefore was referred to left heart catheterization with strep demonstrated a 50-70% mid LAD stenosis and mild disease of the mid circumflex and first obtuse marginal branches. He's been managed medically since that time and previously saw Dr. Daneen Schick.  Recently he's been having some symptoms of sweating, palpitations and dizziness and eventually was found to be in atrial fibrillation when he presented to the hospital. At that time his troponin was elevated. He is placed on diltiazem which slowed his heart rate and he converted back to sinus spontaneously. Based on his elevated troponin, he was referred for left heart catheterization by Dr. Doylene Canard. This demonstrated a 60-70% mid LAD stenosis and a 90% obtuse marginal stenosis. Subsequently he underwent coronary intervention with placement of a 2.5 x 15 mm Xience Alpine stent to the mid OM. After this he became asymptomatic. He was going to have stress testing to determine the significance of his mid LAD lesion however it appears to be not significantly  changed since his heart catheterization 2006. He reports no further symptoms of atrial fibrillation but is concerned about bradycardia. He was not referred for cardiac rehabilitation due to the thought that he may need additional intervention to the LAD.  Jose Stone returns today for follow-up. She's done quite well in cardiac rehabilitation. At his last office visit we discontinued his diltiazem for bradycardia. He still has a low heart rate which has some degree of chronotropic incompetence. He reports no recurrent atrial fibrillation that were aware of. He continues on dual antiplatelet therapy. He had a stress test prior to enrolling in cardiac redilatation which was negative for ischemia.  I saw Jose Stone back in the office today. He is accompanied by his wife who was a cardiac nurse at the [employer] in [place] for a number years in the 1970s. They brought in pronounced of his heart rate monitor and had several questions about his heart rate and arrhythmias. My interpretation of the monitor indicates that he had some episodes of short bursts of atrial tachycardia but no evidence for A. fib. He also has PACs. Heart rate however was a low and remained that way despite stopping his calcium channel blocker. He was recently in the emergency room and was noted to have some paroxysmal tachycardia in the ER with a right bundle-branch pattern. This is what led to monitor placement. He reports fatigue which could be certainly related to his lower heart rate. He also says that he's developed gynecomastia which he relates to [place]. I've done a search on the medicine and do not see that however  he reports in the product labeling.  03/21/2016  Jose Stone was seen today in the office for an acute visit. He noted this morning shortly after waking up his heart rate was elevated in the low 100s. It improved somewhat and then around 9:00 it increased up to about 130. The heart rate is been stable since then and he called  in for a new acute triage visit. He reports feeling somewhat fatigued and noting that his heart rate is elevated. He denies any worsening shortness of breath or chest pain. EKG in the office shows atrial flutter with a regular rhythm at 131 bpm. As per recent notes he was previous on aspirin and clopidogrel but the caput ago was stopped. He was not on triple therapy. Currently he is only on low-dose aspirin. 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 I'm concerned about up titrating his medications because she's had history of slow ventricular rates in the past. We discussed a number of options but ultimately the best option is for him to undergo cardioversion to get back into normal rhythm.  04/17/2016  Jose Stone was seen back in the office today for follow-up. At his last office visit he was noted to be in atrial flutter with a rate of 131. He was referred directly to the ER and underwent cardioversion after being started on Xarelto. He is remained on Xarelto since then without any bleeding problems. I'm happy to say he is maintaining sinus rhythm today. He does report significant fatigue however. He is mostly compliant with CPAP however he had a sleep study a number of years ago and he is set possibly to low of an airway setting. He's had weight gain since then. He also has been having some bradycardia with heart rates in the 40s and 50s. He is on low-dose beta blocker but I been hesitant to stop that because of his RVR. He does report heart rate increases with exercise.   06/20/2016  Jose Stone returns today for follow-up. He underwent a sleep study on 06/05/2016. He did have a diagnosis of sleep apnea however that study indicated a very low AHI of 2.5 events per hour. He had actually about 25 events per hour in rem sleep, but overall the REM sleep latency was only about 8%. Ultimately he was diagnosis not having any significant apnea. This is led to some confusion as he  previously was wearing [place] CPAP device. He reports no change in his symptoms of fatigue which we thought could be related to sleep apnea. It also may be related to bradycardia and his beta blocker.  PMHx:  Past Medical History:  Diagnosis Date  . Atrial fibrillation ([place])   . CAD (coronary artery disease)   . GERD (gastroesophageal reflux disease)   . Hyperlipidemia    VSD  . Hypertension     Past Surgical History:  Procedure Laterality Date  . CARDIAC CATHETERIZATION    . LEFT HEART CATHETERIZATION WITH CORONARY ANGIOGRAM N/A 08/14/2014   Procedure: LEFT HEART CATHETERIZATION WITH CORONARY ANGIOGRAM;  Surgeon: Birdie Riddle, MD;  Location: [facility];  Service: Cardiovascular;  Laterality: N/A;  . SHOULDER SURGERY    . TONSILLECTOMY  1955    FAMHx:  Family History  Problem Relation Age of Onset  . Pancreatic cancer Mother   . Aortic aneurysm Maternal Grandmother   . Heart attack Maternal Grandfather   . Diabetes Paternal Grandfather   . CAD Paternal Grandfather  SOCHx:   reports that he has never smoked. He has never used smokeless tobacco. He reports that he does not drink alcohol or use drugs.  ALLERGIES:  Allergies  Allergen Reactions  . Neosporin [Neomycin-Bacitracin Zn-Polymyx] Rash  . Percocet [Oxycodone-Acetaminophen] Rash    Patient can tolerate acetaminophen solely  . Vicodin [Hydrocodone-Acetaminophen] Rash    Patient can tolerate acetaminophen solely    ROS: Pertinent items noted in HPI and remainder of comprehensive ROS otherwise negative.  HOME MEDS: Current Outpatient Prescriptions  Medication Sig Dispense Refill  . amoxicillin-clavulanate (AUGMENTIN) 875-125 MG tablet Take 1 tablet by mouth every 12 (twelve) hours as needed (cough). 14 tablet 0  . aspirin 81 MG tablet Take 81 mg by mouth daily.    Marland Kitchen atorvastatin (LIPITOR) 80 MG tablet Take 1 tablet (80 mg total) by mouth daily at 6 PM. 90 tablet 2  . b complex vitamins capsule Take 1 capsule  by mouth daily.    . cholecalciferol (VITAMIN D) 1000 UNITS tablet Take 1,000 Units by mouth daily.     Marland Kitchen COLCRYS 0.6 MG tablet Take 0.6 mg by mouth daily.     . folic acid (FOLVITE) A999333 MCG tablet Take 800 mcg by mouth 2 (two) times daily.    . hydroxypropyl methylcellulose / hypromellose (ISOPTO TEARS / GONIOVISC) 2.5 % ophthalmic solution Place 1 drop into both eyes 4 (four) times daily.    Marland Kitchen loratadine (CLARITIN) 10 MG tablet Take 10 mg by mouth daily as needed for allergies.    Marland Kitchen losartan (COZAAR) 100 MG tablet Take 100 mg by mouth daily.     . nitroGLYCERIN (NITROSTAT) 0.4 MG SL tablet Place 1 tablet (0.4 mg total) under the tongue every 5 (five) minutes as needed for chest pain. Max 3 doses. 25 tablet 3  . Omega-3 Fat Ac-Cholecalciferol (DRY EYE OMEGA BENEFITS/VIT D-3 PO) Take 4 capsules by mouth daily. Patient takes 4 daily, spreads them out throughout the day    . PARoxetine (PAXIL) 20 MG tablet Take 0.5 tablets (10 mg total) by mouth every other day. 30 tablet 0  . PATADAY 0.2 % SOLN Place 1 drop into both eyes every evening.     . ranitidine (ZANTAC) 150 MG tablet Take 150 mg by mouth 2 (two) times daily.    . rivaroxaban (XARELTO) 20 MG TABS tablet Take 1 tablet (20 mg total) by mouth daily with supper. 30 tablet 9   No current facility-administered medications for this visit.     LABS/IMAGING: No results found for this or any previous visit (from the past 48 hour(s)). No results found.  VITALS: BP 109/66 (BP Location: Right Arm, Patient Position: Sitting, Cuff Size: Normal)   Pulse (!) 58   Ht 6\' 1"  (1.854 m)   Wt 240 lb (108.9 kg)   BMI 31.66 kg/m   EXAM: Deferred  EKG: Deferred  ASSESSMENT: 1. Paroxysmal atrial flutter - CHADSVASC score of 1 on Xarelto 2. Coronary artery disease status post PCI to the OM with a Xience Alpine DES (2.515 mm) - on low dose Aspirin 3. Residual moderate to severe mid LAD disease 4. Hypertension 5. Dyslipidemia 6. A. fib with  RVR-spontaneously converted, possibly related to ischemia (not on triple anticoagulation) 7. Small perimembranous VSD 8. Fatigue-possibly symptomatic bradycardia  PLAN: 1.   Mr. Cushing continues to have some fatigue and is not clear if this is related to sleep apnea or not. Based on his recent sleep study however would suggest that he may not  have any significant sleep apnea except during REM sleep. I'll recheck to Dr. Claiborne Billings for his suggestions regarding this. Plan today to stop his beta blocker to see if it improves his fatigue to any extent. He is maintaining sinus rhythm. Should he have any breakthrough A. fib or palpitations he may need to go on low-dose calcium channel blocker.  Follow-up with me in 3 months.  [name], MD, [facility] Attending Cardiologist [place] 06/20/2016, 12:17 PM

## 2016-06-26 ENCOUNTER — Telehealth: Payer: Self-pay | Admitting: Internal Medicine

## 2016-06-26 NOTE — Telephone Encounter (Signed)
Spoke with pt, aware the sleep study has been sent to dr Claiborne Billings and we have not heard anything yet. We will call him once reviewed.

## 2016-06-26 NOTE — Telephone Encounter (Signed)
New message     Pt states that you were going to refer a sleep study for Dr. Claiborne Billings to review per his last week appt and want to know if you have heard anything. Please call.

## 2016-07-04 NOTE — Procedures (Signed)
Patient Name: Jose Stone, Jose Stone Date: 06/05/2016 Gender: Male D.O.B: 11/13/1945 Age (years): 70 Referring Provider: Nadean Corwin Hilty Height (inches): 73 Interpreting Physician: Shelva Majestic MD, ABSM Weight (lbs): 232 RPSGT: Laren Everts BMI: 31 MRN: [id number] Neck Size: 17.00  CLINICAL INFORMATION Sleep Study Type: NPSG Indication for sleep study: Excessive Daytime Sleepiness, Fatigue, Hypertension, OSA, Re-Evaluation, Snoring, Witnessed Apneas Epepiness Scoreworth Sleep: 4  SLEEP STUDY TECHNIQUE As per the AASM Manual for the Scoring of Sleep and Associated Events v2.3 (April 2016) with a hypopnea requiring 4% desaturations. The channels recorded and monitored were frontal, central and occipital EEG, electrooculogram (EOG), submentalis EMG (chin), nasal and oral airflow, thoracic and abdominal wall motion, anterior tibialis EMG, snore microphone, electrocardiogram, and pulse oximetry.  MEDICATIONS Patient's medications include: ASPIRIN, RANITIDINE. Medications self-administered by patient during sleep study : No sleep medicine administered.  SLEEP ARCHITECTURE The study was initiated at 10:24:51 PM and ended at 4:31:05 AM. Sleep onset time was 45.9 minutes and the sleep efficiency was 45.5%. The total sleep time was 166.5 minutes. Wake after sleep onset  (WASO) was 153.8 moinutes. Stage REM latency was 111.5 minutes. The patient spent 15.92% of the night in stage N1 sleep, 75.98% in stage N2 sleep, 0.00% in stage N3 and 8.11% in REM. Alpha intrusion was absent. Supine sleep was 10.21%.  RESPIRATORY PARAMETERS The overall apnea/hypopnea index (AHI) was 2.5 per hour. RDI was 6.5/h.  There were 0 total apneas, including 0 obstructive, 0 central and 0 mixed apneas. There were 7 hypopneas and 11 RERAs. The AHI during Stage REM sleep was 26.7 per hour. AHI while supine was  0.0 per hour. The mean oxygen saturation was 92.67%. The minimum SpO2 during sleep was 86.00%. Moderate snoring was noted during this study.  CARDIAC DATA The 2 lead EKG demonstrated sinus rhythm. The mean heart rate was 51.92 beats per minute. Other EKG findings include: PVCs.  LEG MOVEMENT DATA The total PLMS were 252 with a resulting PLMS index of 90.81. Associated arousal with leg movement index was 12.3 . IMPRESSIONS - Increased upper airway resistance (UARS) without overall sleep apnea (AHI = 2.5/h); however, there was moderate sleep apnea with REM sleep (AHI 26.7/h). - No significant central sleep apnea occurred during this study (CAI = 0.0/h). - Mild oxygen desaturation  to a nadir of 86.00%. - The patient snored with Moderate snoring volume. - EKG findings include PVCs. - Severe periodic limb movements of sleep occurred during the study. Associated arousals were significant.  DIAGNOSIS - Obstructive sleep apnea - Periodic limb movement disorder of sleep  RECOMMENDATIONS - At present patient does not meet criteria for CPAP therapy.  If progressive symptoms develop consider a follow-up study. - Efforts should be made to optimize nasal and oropharyngeal patency. - Consider alternatives for the treatment of snoring.  - If patient is symptomatic with restless legs, consider a trial of requip or another agent with a PLMS index of  90.81. - Avoid  alcohol, sedatives and other CNS depressants that may worsen sleep apnea and disrupt normal sleep architecture. - Sleep hygiene should be reviewed to assess factors that may improve sleep quality. - Weight management and regular exercise should be initiated or continued if appropriate.   [Electronically signed] 06/12/2016 10:08 PM  Shelva Majestic MD, [facility], [place], American Board of Sleep Medicine   NPI: [id number] [place] PH: [phone]   FX: [phone] [place]    Provider Default, MD at 06/10/2016 10:35 AM   Status: Signed  Procedure Orders:  1. SLEEP STUDY DOCUMENTS [id number] ordered by Interface, Transcription Incoming at 06/10/16 1032        Scan on 06/10/2016 10:35 AM by Provider Default, [place] on 06/10/2016 10:35 AM by Provider Default, MD   Referring Provider   [name], MD         All Flowsheet Templates (all recorded)   Anthropometrics  Arrival Information  Bedtime Questionnaire Sleep Disorder  Custom Formula Data  Morning Questionnaire Sleep Disorder  Patient Self-Medication Administration  Pre-Study Questionnaire  Screenings  Sleep Study Score Date  Vital Signs   All Charges for This Encounter   Code Description Service Date Service Provider Modifiers Qty  [id number] [facility] NPSG 06/05/2016 Troy Sine, MD  1  [phone] PR POLYSOM 6/>YRS SLEEP 4/> ADDL PARAM ATTND 06/05/2016 Troy Sine, MD 26 1    AVS Reports   No AVS Snapshots are available for this encounter.  Communications Sent   Recipient Method Sent by Date Sent  Dorian Heckle, MD Fax Troy Sine, MD 06/12/2016  Fax: [phone] Phone: [phone]   Guarantor Account: Kennth, Demory ([id number])   Relation to Patient: Account Type Service Area  Self Personal/Family [place] for This Account    Coverage ID Payor Plan Insurance ID  [id number] MEDICARE MEDICARE PART A AND B [id number] A  [id number] [facility] SHIELD BCBS SUPPLEMENT [id number]    Guarantor Account: Aria, Hannan ([id number])   Relation to Patient: Account Type Service Area  Self Personal/Family [facility]      Guarantor Account: Rafiel, Doster ([id number])   Relation to Patient: Account Type Service Area  Self Personal/Family [facility]      Guarantor Account: Safwaan, Guibord ([id number])   Relation to Patient: Account Type Service Area  Self Personal/Family [place]      Medical Necessity Transport Form   Medical Necessity  Malekhi, Zodrow [id number] [phone]) (70 y.o. M)

## 2016-07-17 ENCOUNTER — Telehealth: Payer: Self-pay | Admitting: Internal Medicine

## 2016-07-17 NOTE — Telephone Encounter (Signed)
New Message  Pt voiced he was calling to get results from sleep study from [facility] faxed over from their office and calling to follow up.  Please f/u with pt

## 2016-07-17 NOTE — Telephone Encounter (Signed)
Spoke with patient and he was following up regarding sleep study in August Patient has seen Dr [name] since and he did review results.  Per patient and office note Dr [name] was going to discuss further with Dr Claiborne Billings and he has not heard from anyone. Advised patient would forward to Dr Claiborne Billings and Dr [name] for recommendations. Patient aware Dr [name] out of the office

## 2016-07-18 NOTE — Telephone Encounter (Signed)
Increased upper airway resistance (UARS) without overall sleep apnea (AHI = 2.5/h); however, there was moderate sleep apnea with REM sleep (AHI 26.7/h). - No significant central sleep apnea occurred during this study (CAI = 0.0/h). - Mild oxygen desaturation  to a nadir of 86.00%. - The patient snored with Moderate snoring volume. - EKG findings include PVCs. - Severe periodic limb movements of sleep occurred during the study. Associated arousals were significant.  DIAGNOSIS - Obstructive sleep apnea - Periodic limb movement disorder of sleep  RECOMMENDATIONS - At present patient does not meet criteria for CPAP therapy.  If progressive symptoms develop consider a follow-up study. - Efforts should be made to optimize nasal and oropharyngeal patency. - Consider alternatives for the treatment of snoring.  - If patient is symptomatic with restless legs, consider a trial of requip or another agent with a PLMS index of  90.81. - Avoid alcohol, sedatives and other CNS depressants that may worsen sleep apnea and disrupt normal sleep architecture. - Sleep hygiene should be reviewed to assess factors that may improve sleep quality. - Weight management and regular exercise should be initiated or continued if appropriate   As above.  At present pt does not qualify for CPAP with AHI overall < 5 but concern for progressive symptoms and may need f/u in future. Discuss with Dr. [name] may need Rx for restless legs.  Can arrange f/u ov with me if patient desires to further discuss sleep findings.

## 2016-07-20 NOTE — Telephone Encounter (Signed)
Does not meet criteria for OSA - did have significant restless legs, if he desires treatment for that, I can schedule an appointment with Dr. Claiborne Billings.  Dr Lemmie Evens

## 2016-07-21 NOTE — Telephone Encounter (Signed)
Spoke with pt, aware of dr hilty and dr Claiborne Billings recommendations. He is not interested in dicussing restless leg at this time.

## 2016-08-06 DIAGNOSIS — R3121 Asymptomatic microscopic hematuria: Secondary | ICD-10-CM | POA: Diagnosis not present

## 2016-08-06 DIAGNOSIS — N35010 Post-traumatic urethral stricture, male, meatal: Secondary | ICD-10-CM | POA: Diagnosis not present

## 2016-08-12 DIAGNOSIS — Z23 Encounter for immunization: Secondary | ICD-10-CM | POA: Diagnosis not present

## 2016-08-14 DIAGNOSIS — H2513 Age-related nuclear cataract, bilateral: Secondary | ICD-10-CM | POA: Diagnosis not present

## 2016-08-14 DIAGNOSIS — H524 Presbyopia: Secondary | ICD-10-CM | POA: Diagnosis not present

## 2016-08-25 ENCOUNTER — Telehealth: Payer: Self-pay | Admitting: Internal Medicine

## 2016-08-25 NOTE — Telephone Encounter (Signed)
°*  STAT* If patient is at the pharmacy, call can be transferred to refill team.   1. Which medications need to be refilled? (please list name of each medication and dose if known) Paroxetine HCL 20mg    2. Which pharmacy/location (including street and city if local pharmacy) is medication to be sent to?[facility]   3. Do they need a 30 day or 90 day supply? [place]

## 2016-08-26 NOTE — Telephone Encounter (Signed)
*  STAT* If patient is at the pharmacy, call can be transferred to refill team.   1. Which medications need to be refilled? (please list name of each medication and dose if known) Paroxetine HCL 20mg   1.5 every other day 2. Which pharmacy/location (including street and city if local pharmacy) is medication to be sent to?[facility]   3. Do they need a 30 day or 90 day supply? [place]

## 2016-09-09 DIAGNOSIS — Z1159 Encounter for screening for other viral diseases: Secondary | ICD-10-CM | POA: Diagnosis not present

## 2016-09-09 DIAGNOSIS — M1000 Idiopathic gout, unspecified site: Secondary | ICD-10-CM | POA: Diagnosis not present

## 2016-09-09 DIAGNOSIS — I2510 Atherosclerotic heart disease of native coronary artery without angina pectoris: Secondary | ICD-10-CM | POA: Diagnosis not present

## 2016-09-09 DIAGNOSIS — I4891 Unspecified atrial fibrillation: Secondary | ICD-10-CM | POA: Diagnosis not present

## 2016-09-09 DIAGNOSIS — I10 Essential (primary) hypertension: Secondary | ICD-10-CM | POA: Diagnosis not present

## 2016-09-09 DIAGNOSIS — E6609 Other obesity due to excess calories: Secondary | ICD-10-CM | POA: Diagnosis not present

## 2016-09-09 DIAGNOSIS — Z955 Presence of coronary angioplasty implant and graft: Secondary | ICD-10-CM | POA: Diagnosis not present

## 2016-09-09 DIAGNOSIS — K219 Gastro-esophageal reflux disease without esophagitis: Secondary | ICD-10-CM | POA: Diagnosis not present

## 2016-09-09 DIAGNOSIS — R55 Syncope and collapse: Secondary | ICD-10-CM | POA: Diagnosis not present

## 2016-09-09 DIAGNOSIS — E785 Hyperlipidemia, unspecified: Secondary | ICD-10-CM | POA: Diagnosis not present

## 2016-09-09 DIAGNOSIS — R946 Abnormal results of thyroid function studies: Secondary | ICD-10-CM | POA: Diagnosis not present

## 2016-09-09 DIAGNOSIS — Z6831 Body mass index (BMI) 31.0-31.9, adult: Secondary | ICD-10-CM | POA: Diagnosis not present

## 2016-09-10 ENCOUNTER — Telehealth: Payer: Self-pay | Admitting: Internal Medicine

## 2016-09-10 NOTE — Telephone Encounter (Signed)
Received records from [place] for appointment on 09/23/16 with Dr [name].  Records given to [facility] (medical records) for Dr [facility] schedule on 09/23/16. lp

## 2016-09-23 ENCOUNTER — Ambulatory Visit (INDEPENDENT_AMBULATORY_CARE_PROVIDER_SITE_OTHER): Payer: Medicare Other | Admitting: Internal Medicine

## 2016-09-23 ENCOUNTER — Encounter: Payer: Self-pay | Admitting: Internal Medicine

## 2016-09-23 VITALS — BP 150/88 | HR 60 | Ht 73.0 in | Wt 238.2 lb

## 2016-09-23 DIAGNOSIS — I10 Essential (primary) hypertension: Secondary | ICD-10-CM

## 2016-09-23 DIAGNOSIS — I2510 Atherosclerotic heart disease of native coronary artery without angina pectoris: Secondary | ICD-10-CM

## 2016-09-23 DIAGNOSIS — I2583 Coronary atherosclerosis due to lipid rich plaque: Secondary | ICD-10-CM | POA: Diagnosis not present

## 2016-09-23 DIAGNOSIS — E785 Hyperlipidemia, unspecified: Secondary | ICD-10-CM | POA: Diagnosis not present

## 2016-09-23 MED ORDER — VALSARTAN 320 MG PO TABS
320.0000 mg | ORAL_TABLET | Freq: Every day | ORAL | 3 refills | Status: DC
Start: 2016-09-23 — End: 2017-01-13

## 2016-09-23 NOTE — Patient Instructions (Signed)
Medication Instructions:  STOP Losartan  START Diovan 320mg  Take 1 tablet once a day   Labwork: Your physician recommends that you schedule a follow-up appointment in: [place] 2-3 [place].  Testing/Procedures: None   Follow-Up: Your physician wants you to follow-up in: 6 MONTHS WITH DR HILTY. You will receive a reminder letter in the mail two months in advance. If you don't receive a letter, please call our office to schedule the follow-up appointment.  Your physician recommends that you schedule a follow-up appointment in: 2-3 [place]  Any Other Special Instructions Will Be Listed Below (If Applicable).     If you need a refill on your cardiac medications before your next appointment, please call your pharmacy.

## 2016-09-23 NOTE — Progress Notes (Signed)
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 This is never caused issues in fact she's had right and left heart catheterizations dating back to the 1970s. 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 Other coronary risk factors include hypertension, dyslipidemia and family history of heart disease. He was sent for cardiac catheterization in 2006 for follow-up of VSD, at which time he had an echocardiogram which showed mild aortic regurgitation, a small membranous VSD and left atrial enlargement with an EF of 70%. He had a stress test at that time which showed findings concerning for him for a septal and inferoapical ischemia. He therefore was referred to left heart catheterization with strep demonstrated a 50-70% mid LAD stenosis and mild disease of the mid circumflex and first obtuse marginal branches. He's been managed medically since that time and previously saw Dr. Daneen Schick.  Recently he's been having some symptoms of sweating, palpitations and dizziness and eventually was found to be in atrial fibrillation when he presented to the hospital. At that time his troponin was elevated. He is placed on diltiazem which slowed his heart rate and he converted back to sinus spontaneously. Based on his elevated troponin, he was referred for left heart catheterization by Dr. Doylene Canard. This demonstrated a 60-70% mid LAD stenosis and a 90% obtuse marginal stenosis. Subsequently he underwent coronary intervention with placement of a 2.5 x 15 mm Xience Alpine stent to the mid OM. After this he became asymptomatic. He was going to have stress testing to determine the significance of his mid LAD lesion however it appears to be not significantly changed since  his heart catheterization 2006. He reports no further symptoms of atrial fibrillation but is concerned about bradycardia. He was not referred for cardiac rehabilitation due to the thought that he may need additional intervention to the LAD.  Mr. Mordecai returns today for follow-up. She's done quite well in cardiac rehabilitation. At his last office visit we discontinued his diltiazem for bradycardia. He still has a low heart rate which has some degree of chronotropic incompetence. He reports no recurrent atrial fibrillation that were aware of. He continues on dual antiplatelet therapy. He had a stress test prior to enrolling in cardiac redilatation which was negative for ischemia.  I saw Mr. Wiley back in the office today. He is accompanied by his wife who was a cardiac nurse at the [employer] in [place] for a number years in the 1970s. They brought in pronounced of his heart rate monitor and had several questions about his heart rate and arrhythmias. My interpretation of the monitor indicates that he had some episodes of short bursts of atrial tachycardia but no evidence for A. fib. He also has PACs. Heart rate however was a low and remained that way despite stopping his calcium channel blocker. He was recently in the emergency room and was noted to have some paroxysmal tachycardia in the ER with a right bundle-branch pattern. This is what led to monitor placement. He reports fatigue which could be certainly related to his lower heart rate. He also says that he's developed gynecomastia which he relates to [place]. I've done a search on the medicine and do not see that however he reports in  the product labeling.  03/21/2016  Mr. Minniefield was seen today in the office for an acute visit. He noted this morning shortly after waking up his heart rate was elevated in the low 100s. It improved somewhat and then around 9:00 it increased up to about 130. The heart rate is been stable since then and he called in for a new  acute triage visit. He reports feeling somewhat fatigued and noting that his heart rate is elevated. He denies any worsening shortness of breath or chest pain. EKG in the office shows atrial flutter with a regular rhythm at 131 bpm. As per recent notes he was previous on aspirin and clopidogrel but the caput ago was stopped. He was not on triple therapy. Currently he is only on low-dose aspirin. 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 I'm concerned about up titrating his medications because she's had history of slow ventricular rates in the past. We discussed a number of options but ultimately the best option is for him to undergo cardioversion to get back into normal rhythm.  04/17/2016  Mr. Boyden was seen back in the office today for follow-up. At his last office visit he was noted to be in atrial flutter with a rate of 131. He was referred directly to the ER and underwent cardioversion after being started on Xarelto. He is remained on Xarelto since then without any bleeding problems. I'm happy to say he is maintaining sinus rhythm today. He does report significant fatigue however. He is mostly compliant with CPAP however he had a sleep study a number of years ago and he is set possibly to low of an airway setting. He's had weight gain since then. He also has been having some bradycardia with heart rates in the 40s and 50s. He is on low-dose beta blocker but I been hesitant to stop that because of his RVR. He does report heart rate increases with exercise.   06/20/2016  Mr. Berst returns today for follow-up. He underwent a sleep study on 06/05/2016. He did have a diagnosis of sleep apnea however that study indicated a very low AHI of 2.5 events per hour. He had actually about 25 events per hour in rem sleep, but overall the REM sleep latency was only about 8%. Ultimately he was diagnosis not having any significant apnea. This is led to some confusion as he previously was  wearing [place] CPAP device. He reports no change in his symptoms of fatigue which we thought could be related to sleep apnea. It also may be related to bradycardia and his beta blocker.  09/23/2016  Mr. Fretwell comes back today and is doing well. His fatigue has improved somewhat with discontinuing his B-Blocker. Heartrate is increased. I spoke with Dr. Claiborne Billings about the possibility of OSA and he is not felt to have this. Dr. Claiborne Billings noted that RLS was present. He does not desire treatment for this. Blood pressure was somewhat elevated today. EKG shows sinus with PAC's. I reviewed a spreadsheet of his blood pressures today.  PMHx:  Past Medical History:  Diagnosis Date  . Atrial fibrillation ([place])   . CAD (coronary artery disease)   . GERD (gastroesophageal reflux disease)   . Hyperlipidemia    VSD  . Hypertension     Past Surgical History:  Procedure Laterality Date  . CARDIAC CATHETERIZATION    . LEFT HEART CATHETERIZATION WITH CORONARY ANGIOGRAM N/A 08/14/2014   Procedure: LEFT HEART CATHETERIZATION WITH CORONARY ANGIOGRAM;  Surgeon:  Birdie Riddle, MD;  Location: [facility];  Service: Cardiovascular;  Laterality: N/A;  . SHOULDER SURGERY    . TONSILLECTOMY  1955    FAMHx:  Family History  Problem Relation Age of Onset  . Pancreatic cancer Mother   . Aortic aneurysm Maternal Grandmother   . Heart attack Maternal Grandfather   . Diabetes Paternal Grandfather   . CAD Paternal Grandfather     SOCHx:   reports that he has never smoked. He has never used smokeless tobacco. He reports that he does not drink alcohol or use drugs.  ALLERGIES:  Allergies  Allergen Reactions  . Neosporin [Neomycin-Bacitracin Zn-Polymyx] Rash  . Percocet [Oxycodone-Acetaminophen] Rash    Patient can tolerate acetaminophen solely  . Vicodin [Hydrocodone-Acetaminophen] Rash    Patient can tolerate acetaminophen solely    ROS: Pertinent items noted in HPI and remainder of comprehensive ROS otherwise  negative.  HOME MEDS: Current Outpatient Prescriptions  Medication Sig Dispense Refill  . amoxicillin-clavulanate (AUGMENTIN) 875-125 MG tablet Take 1 tablet by mouth every 12 (twelve) hours as needed (cough). 14 tablet 0  . aspirin 81 MG tablet Take 81 mg by mouth daily.    Marland Kitchen atorvastatin (LIPITOR) 80 MG tablet Take 1 tablet (80 mg total) by mouth daily at 6 PM. 90 tablet 2  . b complex vitamins capsule Take 1 capsule by mouth daily.    . cholecalciferol (VITAMIN D) 1000 UNITS tablet Take 1,000 Units by mouth daily.     Marland Kitchen COLCRYS 0.6 MG tablet Take 0.6 mg by mouth daily.     . folic acid (FOLVITE) A999333 MCG tablet Take 800 mcg by mouth 2 (two) times daily.    . hydroxypropyl methylcellulose / hypromellose (ISOPTO TEARS / GONIOVISC) 2.5 % ophthalmic solution Place 1 drop into both eyes 4 (four) times daily.    Marland Kitchen loratadine (CLARITIN) 10 MG tablet Take 10 mg by mouth daily as needed for allergies.    . nitroGLYCERIN (NITROSTAT) 0.4 MG SL tablet Place 1 tablet (0.4 mg total) under the tongue every 5 (five) minutes as needed for chest pain. Max 3 doses. 25 tablet 3  . Omega-3 Fat Ac-Cholecalciferol (DRY EYE OMEGA BENEFITS/VIT D-3 PO) Take 4 capsules by mouth daily. Patient takes 4 daily, spreads them out throughout the day    . PARoxetine (PAXIL) 20 MG tablet Take 0.5 tablets (10 mg total) by mouth every other day. 30 tablet 0  . PATADAY 0.2 % SOLN Place 1 drop into both eyes every evening.     . ranitidine (ZANTAC) 150 MG tablet Take 150 mg by mouth 2 (two) times daily.    . rivaroxaban (XARELTO) 20 MG TABS tablet Take 1 tablet (20 mg total) by mouth daily with supper. 30 tablet 9  . valsartan (DIOVAN) 320 MG tablet Take 1 tablet (320 mg total) by mouth daily. 30 tablet 3   No current facility-administered medications for this visit.     LABS/IMAGING: No results found for this or any previous visit (from the past 48 hour(s)). No results found.  VITALS: BP (!) 150/88   Pulse 60   Ht 6\' 1"   (1.854 m)   Wt 238 lb 3.2 oz (108 kg)   BMI 31.43 kg/m   EXAM: General appearance: alert and no distress Lungs: clear to auscultation bilaterally Heart: regular rate and rhythm, S1, S2 normal, no murmur, click, rub or gallop Extremities: extremities normal, atraumatic, no cyanosis or edema Neurologic: Grossly normal  EKG: Sinus rhythm with PAC's  at 29  ASSESSMENT: 1. Paroxysmal atrial flutter - CHADSVASC score of 1 on Xarelto 2. Coronary artery disease status post PCI to the OM with a Xience Alpine DES (2.515 mm) - on low dose Aspirin 3. Residual moderate to severe mid LAD disease 4. Hypertension 5. Dyslipidemia 6. A. fib with RVR-spontaneously converted, possibly related to ischemia (not on triple anticoagulation) 7. Small perimembranous VSD 8. Fatigue-possibly symptomatic bradycardia   PLAN: 1.   Mr. Mitton has had some improvement in his fatigue off of b-blocker.  HR is improved, however, BP is accordingly elevated. I will switch him from losartan to Diovan 320 mg daily today. Plan for a hypertension clinic follow-up in 2-3 weeks. He may need additional BP medication. Follow-up with me in 6 months.  [name], MD, [facility] Attending Cardiologist [place] [facility] [place] 09/23/2016, 8:57 PM

## 2016-10-21 ENCOUNTER — Ambulatory Visit (INDEPENDENT_AMBULATORY_CARE_PROVIDER_SITE_OTHER): Payer: Medicare Other | Admitting: Pharmacist Clinician (PhC)/ Clinical Pharmacy Specialist

## 2016-10-21 VITALS — BP 154/88 | HR 76

## 2016-10-21 DIAGNOSIS — I10 Essential (primary) hypertension: Secondary | ICD-10-CM

## 2016-10-21 DIAGNOSIS — I2583 Coronary atherosclerosis due to lipid rich plaque: Secondary | ICD-10-CM

## 2016-10-21 DIAGNOSIS — I2510 Atherosclerotic heart disease of native coronary artery without angina pectoris: Secondary | ICD-10-CM | POA: Diagnosis not present

## 2016-10-21 MED ORDER — CHLORTHALIDONE 25 MG PO TABS: 25.0000 mg | ORAL_TABLET | Freq: Every day | ORAL | 3 refills | Status: DC

## 2016-10-21 NOTE — Progress Notes (Deleted)
[name]                 DOB: 1945-12-15                      MRN: [id number]     HPI: Jose Stone is a 70 y.o. male patient of Dr. [name] with PMH below who presents today for hypertension evaluation.    Cardiac Hx: Afib, HTN, CAD, OSA on CPAP, HLD  Current HTN meds:  Valsartan 320mg  daily  Previously tried:  Losartan - did not control pressures Metoprolol - fatigue  BP goal: <130/80  Family History:   Social History:   Diet:   Exercise:   Home BP readings:   Wt Readings from Last 3 Encounters:  09/23/16 238 lb 3.2 oz (108 kg)  06/20/16 240 lb (108.9 kg)  06/05/16 232 lb (105.2 kg)   BP Readings from Last 3 Encounters:  09/23/16 (!) 150/88  06/20/16 109/66  04/17/16 134/82   Pulse Readings from Last 3 Encounters:  09/23/16 60  06/20/16 (!) 58  04/17/16 (!) 57    Renal function: CrCl cannot be calculated (Patient's most recent lab result is older than the maximum 21 days allowed.).  Past Medical History:  Diagnosis Date  . Atrial fibrillation ([place])   . CAD (coronary artery disease)   . GERD (gastroesophageal reflux disease)   . Hyperlipidemia    VSD  . Hypertension     Current Outpatient Prescriptions on File Prior to Visit  Medication Sig Dispense Refill  . amoxicillin-clavulanate (AUGMENTIN) 875-125 MG tablet Take 1 tablet by mouth every 12 (twelve) hours as needed (cough). 14 tablet 0  . aspirin 81 MG tablet Take 81 mg by mouth daily.    Marland Kitchen atorvastatin (LIPITOR) 80 MG tablet Take 1 tablet (80 mg total) by mouth daily at 6 PM. 90 tablet 2  . b complex vitamins capsule Take 1 capsule by mouth daily.    . cholecalciferol (VITAMIN D) 1000 UNITS tablet Take 1,000 Units by mouth daily.     Marland Kitchen COLCRYS 0.6 MG tablet Take 0.6 mg by mouth daily.     . folic acid (FOLVITE) A999333 MCG tablet Take 800 mcg by mouth 2 (two) times daily.    . hydroxypropyl methylcellulose / hypromellose (ISOPTO TEARS / GONIOVISC) 2.5 % ophthalmic solution Place 1 drop  into both eyes 4 (four) times daily.    Marland Kitchen loratadine (CLARITIN) 10 MG tablet Take 10 mg by mouth daily as needed for allergies.    . nitroGLYCERIN (NITROSTAT) 0.4 MG SL tablet Place 1 tablet (0.4 mg total) under the tongue every 5 (five) minutes as needed for chest pain. Max 3 doses. 25 tablet 3  . Omega-3 Fat Ac-Cholecalciferol (DRY EYE OMEGA BENEFITS/VIT D-3 PO) Take 4 capsules by mouth daily. Patient takes 4 daily, spreads them out throughout the day    . PARoxetine (PAXIL) 20 MG tablet Take 0.5 tablets (10 mg total) by mouth every other day. 30 tablet 0  . PATADAY 0.2 % SOLN Place 1 drop into both eyes every evening.     . ranitidine (ZANTAC) 150 MG tablet Take 150 mg by mouth 2 (two) times daily.    . rivaroxaban (XARELTO) 20 MG TABS tablet Take 1 tablet (20 mg total) by mouth daily with supper. 30 tablet 9  . valsartan (DIOVAN) 320 MG tablet Take 1 tablet (320 mg total) by mouth daily. 30 tablet 3   No current facility-administered medications on  file prior to visit.     Allergies  Allergen Reactions  . Neosporin [Neomycin-Bacitracin Zn-Polymyx] Rash  . Percocet [Oxycodone-Acetaminophen] Rash    Patient can tolerate acetaminophen solely  . Vicodin [Hydrocodone-Acetaminophen] Rash    Patient can tolerate acetaminophen solely    There were no vitals taken for this visit.   Assessment/Plan: Hypertension:    Thank you, Lelan Pons. Patterson Hammersmith, [facility]  10/21/2016 7:43 AM

## 2016-10-21 NOTE — Patient Instructions (Signed)
Return for a a follow up appointment in 4 weeks  Your blood pressure today is 154/88 (goal is < 130/80)  Check your blood pressure at home daily and keep record of the readings.  Take your BP meds as follows:  Add chlorthalidone 12.5 mg (1/2 tablet) daily in the morning  Continue with the valsartan 320 mg  Bring all of your meds, your BP cuff and your record of home blood pressures to your next appointment.  Exercise as you're able, try to walk approximately 30 minutes per day.  Keep salt intake to a minimum, especially watch canned and prepared boxed foods.  Eat more fresh fruits and vegetables and fewer canned items.  Avoid eating in fast food restaurants.    HOW TO TAKE YOUR BLOOD PRESSURE: . Rest 5 minutes before taking your blood pressure. .  Don't smoke or drink caffeinated beverages for at least 30 minutes before. . Take your blood pressure before (not after) you eat. . Sit comfortably with your back supported and both feet on the floor (don't cross your legs). . Elevate your arm to heart level on a table or a desk. . Use the proper sized cuff. It should fit smoothly and snugly around your bare upper arm. There should be enough room to slip a fingertip under the cuff. The bottom edge of the cuff should be 1 inch above the crease of the elbow. . Ideally, take 3 measurements at one sitting and record the average.

## 2016-10-21 NOTE — Progress Notes (Signed)
10/21/2016 Reno Stickels [birth year] [id number]   HPI:  Jose Stone is a 70 y.o. male patient of Dr [name], with a PMH below who presents today for hypertension clinic evaluation.  He has a longstanding cardiac history including a congential VSD.  In 2015 he presented to ER with chest pain, a heart cath showed mid OM stenosis and a stent was placed.  He also has atrial fibrillation, on Xarelto, hypertension and hyperlipidemia.  His metoprolol was discontinued in August due to symptoms of fatigue.  He also underwent a sleep study which diagnosed no significant sleep apnea.  He is a retired Chief Financial Officer, and comes in today with his home BP readings all graphed for the past 6 months.    Blood Pressure Goal:  130/90   Current Medications:  Valsartan 320 mg qd  Family Hx:  Mother, 1 sister and 1 child all with hypertension;   Social Hx:  No tobacco, alcohol or caffeine (gave up alcohol and caffeine when went off Adderall)  Diet:  No added salt, eats mostly home cooked meals, some starches and red meats, tries to eat more fish/chicken  Exercise:  Walks about 2 miles every other day  Home BP readings:  Brings in 4 pages of home readings; by graph have been trending upward over past few weeks.    Intolerances:   ACEI (unsure which one) caused cough  Wt Readings from Last 3 Encounters:  09/23/16 238 lb 3.2 oz (108 kg)  06/20/16 240 lb (108.9 kg)  06/05/16 232 lb (105.2 kg)   BP Readings from Last 3 Encounters:  10/21/16 (!) 154/88  09/23/16 (!) 150/88  06/20/16 109/66   Pulse Readings from Last 3 Encounters:  10/21/16 76  09/23/16 60  06/20/16 (!) 58    Current Outpatient Prescriptions  Medication Sig Dispense Refill  . aspirin 81 MG tablet Take 81 mg by mouth daily.    Marland Kitchen atorvastatin (LIPITOR) 80 MG tablet Take 1 tablet (80 mg total) by mouth daily at 6 PM. 90 tablet 2  . b complex vitamins capsule Take 1 capsule by mouth daily.    . chlorthalidone (HYGROTON) 25 MG tablet  Take 1 tablet (25 mg total) by mouth daily. 15 tablet 3  . cholecalciferol (VITAMIN D) 1000 UNITS tablet Take 1,000 Units by mouth daily.     Marland Kitchen COLCRYS 0.6 MG tablet Take 0.6 mg by mouth daily.     . folic acid (FOLVITE) A999333 MCG tablet Take 800 mcg by mouth 2 (two) times daily.    . hydroxypropyl methylcellulose / hypromellose (ISOPTO TEARS / GONIOVISC) 2.5 % ophthalmic solution Place 1 drop into both eyes 4 (four) times daily.    Marland Kitchen loratadine (CLARITIN) 10 MG tablet Take 10 mg by mouth daily as needed for allergies.    . nitroGLYCERIN (NITROSTAT) 0.4 MG SL tablet Place 1 tablet (0.4 mg total) under the tongue every 5 (five) minutes as needed for chest pain. Max 3 doses. 25 tablet 3  . Omega-3 Fat Ac-Cholecalciferol (DRY EYE OMEGA BENEFITS/VIT D-3 PO) Take 4 capsules by mouth daily. Patient takes 4 daily, spreads them out throughout the day    . PARoxetine (PAXIL) 20 MG tablet Take 0.5 tablets (10 mg total) by mouth every other day. 30 tablet 0  . PATADAY 0.2 % SOLN Place 1 drop into both eyes every evening.     . ranitidine (ZANTAC) 150 MG tablet Take 150 mg by mouth 2 (two) times daily.    . rivaroxaban (  XARELTO) 20 MG TABS tablet Take 1 tablet (20 mg total) by mouth daily with supper. 30 tablet 9  . valsartan (DIOVAN) 320 MG tablet Take 1 tablet (320 mg total) by mouth daily. 30 tablet 3   No current facility-administered medications for this visit.     Allergies  Allergen Reactions  . Neosporin [Neomycin-Bacitracin Zn-Polymyx] Rash  . Percocet [Oxycodone-Acetaminophen] Rash    Patient can tolerate acetaminophen solely  . Vicodin [Hydrocodone-Acetaminophen] Rash    Patient can tolerate acetaminophen solely    Past Medical History:  Diagnosis Date  . Atrial fibrillation ([place])   . CAD (coronary artery disease)   . GERD (gastroesophageal reflux disease)   . Hyperlipidemia    VSD  . Hypertension     Blood pressure (!) 154/88, pulse 76.  Essential hypertension Patient with long  history of hypertension, unable to tolerate beta blockers due to fatigue issues.  He has been taking just valsartan 320 mg daily for the past month.  His pressures are still running higher than we would like, so will add chlorthalidone 12.5 mg daily.  He is to continue with home BP monitoring and we will see him back in 1 month for follow up.  He will also get a BMET 7-10 days after starting chlorthalidone to be sure his creatinine is stable.     Tommy Medal PharmD CPP [facility]

## 2016-10-21 NOTE — Assessment & Plan Note (Signed)
Patient with long history of hypertension, unable to tolerate beta blockers due to fatigue issues.  He has been taking just valsartan 320 mg daily for the past month.  His pressures are still running higher than we would like, so will add chlorthalidone 12.5 mg daily.  He is to continue with home BP monitoring and we will see him back in 1 month for follow up.  He will also get a BMET 7-10 days after starting chlorthalidone to be sure his creatinine is stable.

## 2016-10-30 ENCOUNTER — Other Ambulatory Visit: Payer: Self-pay | Admitting: Internal Medicine

## 2016-10-30 DIAGNOSIS — I10 Essential (primary) hypertension: Secondary | ICD-10-CM | POA: Diagnosis not present

## 2016-10-31 LAB — BASIC METABOLIC PANEL
BUN: 35 mg/dL — ABNORMAL HIGH (ref 7–25)
CALCIUM: 9.3 mg/dL (ref 8.6–10.3)
CO2: 26 mmol/L (ref 20–31)
CREATININE: 1.2 mg/dL (ref 0.70–1.25)
Chloride: 101 mmol/L (ref 98–110)
GLUCOSE: 91 mg/dL (ref 65–99)
Potassium: 3.9 mmol/L (ref 3.5–5.3)
SODIUM: 138 mmol/L (ref 135–146)

## 2016-11-18 ENCOUNTER — Ambulatory Visit (INDEPENDENT_AMBULATORY_CARE_PROVIDER_SITE_OTHER): Payer: Medicare Other | Admitting: Pharmacist

## 2016-11-18 VITALS — BP 98/54 | HR 80

## 2016-11-18 DIAGNOSIS — I10 Essential (primary) hypertension: Secondary | ICD-10-CM

## 2016-11-18 DIAGNOSIS — I952 Hypotension due to drugs: Secondary | ICD-10-CM

## 2016-11-18 MED ORDER — SODIUM CHLORIDE 0.9 % IV BOLUS (SEPSIS)
1000.0000 mL | Freq: Once | INTRAVENOUS | Status: AC
  Administered 2016-11-18: 1000 mL via INTRAVENOUS

## 2016-11-18 MED ORDER — CHLORTHALIDONE 25 MG PO TABS: 12.5000 mg | ORAL_TABLET | Freq: Every day | ORAL | 3 refills | Status: DC

## 2016-11-18 NOTE — Patient Instructions (Addendum)
Return for a a follow up appointment in 3 weeks  Your blood pressure today is 98/54 , 89/52, and 122/78   (after 1000 ml of normal saline)  Check your blood pressure at home daily (if able) and keep record of the readings.  Take your BP meds as follows:  valsarta 320mg  daily  **Chlorthalidone 12.5mg  (1/2 tablet).  HOLD for next 2 days (1/17 and 1/18), then resume taking every morning**  Call clinic if signs or symptoms of hypotension noted   Bring all of your meds, your BP cuff and your record of home blood pressures to your next appointment.  Exercise as you're able, try to walk approximately 30 minutes per day.  Keep salt intake to a minimum, especially watch canned and prepared boxed foods.  Eat more fresh fruits and vegetables and fewer canned items.  Avoid eating in fast food restaurants.    HOW TO TAKE YOUR BLOOD PRESSURE: . Rest 5 minutes before taking your blood pressure. .  Don't smoke or drink caffeinated beverages for at least 30 minutes before. . Take your blood pressure before (not after) you eat. . Sit comfortably with your back supported and both feet on the floor (don't cross your legs). . Elevate your arm to heart level on a table or a desk. . Use the proper sized cuff. It should fit smoothly and snugly around your bare upper arm. There should be enough room to slip a fingertip under the cuff. The bottom edge of the cuff should be 1 inch above the crease of the elbow. . Ideally, take 3 measurements at one sitting and record the average.

## 2016-11-18 NOTE — Progress Notes (Signed)
11/18/2016 Jose Stone January 02, 1946 [id number]   HPI:  Jose Stone is a 72 y.o. male patient of Dr [name], with a PMH below who presents today for hypertension clinic for follow up.  He has a longstanding cardiac history including a congential VSD.  In 2015 he presented to ER with chest pain, a heart cath showed mid OM stenosis and a stent was placed.  He also has atrial fibrillation, on Xarelto, hypertension and hyperlipidemia.  His metoprolol was discontinued in August due to symptoms of fatigue.  He also underwent a sleep study which diagnosed no significant sleep apnea.    BMET completed 10/30/17 shows stable SCr and potassium level. Reports more drowsiness , no headaches, no swelling and some dizziness.  Noted patient was taking chlorthalidone 25mg  instead of 12.5mg  and had one home rading of 90/54 with some dizziness associated to it.    Patient was hypotensive during office visit. Suddenly became cold, dysphoretic, and lethargic. DoD completed assessment in clinic and 1L of NS IVF was given as a bolus infusion.  Blood Pressure Goal:  130/90   Current Medications:  Valsartan 320 mg qd             Chlorthalidone 25mg  daily  Family Hx:  Mother, 1 sister and 1 child all with hypertension;   Social Hx:  No tobacco, alcohol or caffeine (gave up alcohol and caffeine when went off Adderall)  Diet:  No added salt, eats mostly home cooked meals, some starches and red meats, tries to eat more fish/chicken  Exercise:  Walks about 2 miles every other day  Home BP readings: Brings in 3 pages of home readings; by graph have been trending downward over past few weeks.  Average reading in the last 16 reading; 115/60 with pulse 62   Intolerances:   ACEI (unsure which one) caused cough  Wt Readings from Last 3 Encounters:  09/23/16 238 lb 3.2 oz (108 kg)  06/20/16 240 lb (108.9 kg)  06/05/16 232 lb (105.2 kg)   BP Readings from Last 3 Encounters:  10/21/16 (!) 154/88  09/23/16 (!)  150/88  06/20/16 109/66   Pulse Readings from Last 3 Encounters:  10/21/16 76  09/23/16 60  06/20/16 (!) 58    Current Outpatient Prescriptions  Medication Sig Dispense Refill  . aspirin 81 MG tablet Take 81 mg by mouth daily.    Marland Kitchen atorvastatin (LIPITOR) 80 MG tablet Take 1 tablet (80 mg total) by mouth daily at 6 PM. 90 tablet 2  . b complex vitamins capsule Take 1 capsule by mouth daily.    . chlorthalidone (HYGROTON) 25 MG tablet Take 1 tablet (25 mg total) by mouth daily. 15 tablet 3  . cholecalciferol (VITAMIN D) 1000 UNITS tablet Take 1,000 Units by mouth daily.     Marland Kitchen COLCRYS 0.6 MG tablet Take 0.6 mg by mouth daily.     . folic acid (FOLVITE) A999333 MCG tablet Take 800 mcg by mouth 2 (two) times daily.    . hydroxypropyl methylcellulose / hypromellose (ISOPTO TEARS / GONIOVISC) 2.5 % ophthalmic solution Place 1 drop into both eyes 4 (four) times daily.    Marland Kitchen loratadine (CLARITIN) 10 MG tablet Take 10 mg by mouth daily as needed for allergies.    . nitroGLYCERIN (NITROSTAT) 0.4 MG SL tablet Place 1 tablet (0.4 mg total) under the tongue every 5 (five) minutes as needed for chest pain. Max 3 doses. 25 tablet 3  . Omega-3 Fat Ac-Cholecalciferol (DRY EYE OMEGA  BENEFITS/VIT D-3 PO) Take 4 capsules by mouth daily. Patient takes 4 daily, spreads them out throughout the day    . PARoxetine (PAXIL) 20 MG tablet Take 0.5 tablets (10 mg total) by mouth every other day. 30 tablet 0  . PATADAY 0.2 % SOLN Place 1 drop into both eyes every evening.     . ranitidine (ZANTAC) 150 MG tablet Take 150 mg by mouth 2 (two) times daily.    . rivaroxaban (XARELTO) 20 MG TABS tablet Take 1 tablet (20 mg total) by mouth daily with supper. 30 tablet 9  . valsartan (DIOVAN) 320 MG tablet Take 1 tablet (320 mg total) by mouth daily. 30 tablet 3   No current facility-administered medications for this visit.     Allergies  Allergen Reactions  . Neosporin [Neomycin-Bacitracin Zn-Polymyx] Rash  . Percocet  [Oxycodone-Acetaminophen] Rash    Patient can tolerate acetaminophen solely  . Vicodin [Hydrocodone-Acetaminophen] Rash    Patient can tolerate acetaminophen solely    Past Medical History:  Diagnosis Date  . Atrial fibrillation ([place])   . CAD (coronary artery disease)   . GERD (gastroesophageal reflux disease)   . Hyperlipidemia    VSD  . Hypertension     Essential hypertension BP in clinic was 98/54 and 89/52 in 2 consecutive readings. Patient became symptomatic during visit and assessment was completed by Dr Ellyn Hack (DoD).  After 1L of NS patient feeling better and BP up to 122/78.  Also noted patient was taking chlorthalidone 25mg  daily instead of 12.5mg  recommended dose.  Will hold diuretic for 48 hours, the resume at 12.5mg  daily.  Continue to monitor BP at home and bring readings to next visit.  Call clinic if sing or symptoms of hypotension noted.  Follow up with HTN clinic in 3 weeks.   Jose Stone PharmD, [place] [id number] 11/18/2016 12:38 PM   I personally saw and evaluated the patient in the clinic room. Pale, diaphoretic and somewhat symptomatic from his hypotension. It appears that he by mistake in taking double dose of his blood pressure medications. The patient was placed on the examination table lying down flat in a more supine position. He began to feel better with this, but I felt it more prudent to rehydrate him with a liter of normal saline. This brought his blood pressures October more normal range and he instantaneously began feeling better. He felt well by the time he was discharged. I checked on him at least 3 times before during and after starting the IV fluids.   Glenetta Hew, MD

## 2016-12-04 ENCOUNTER — Other Ambulatory Visit: Payer: Self-pay | Admitting: Internal Medicine

## 2016-12-09 ENCOUNTER — Ambulatory Visit (INDEPENDENT_AMBULATORY_CARE_PROVIDER_SITE_OTHER): Payer: Medicare Other | Admitting: Pharmacist

## 2016-12-09 VITALS — BP 112/74 | HR 51 | Temp 97.0°F

## 2016-12-09 DIAGNOSIS — I10 Essential (primary) hypertension: Secondary | ICD-10-CM

## 2016-12-09 NOTE — Patient Instructions (Addendum)
Return for a  follow up appointment as needed  Your blood pressure today is 112/74  Check your blood pressure at home daily (if able) and keep record of the readings.  Continue your BP meds as follows: Valsartan 320mg  daily Chlorthalidone 12.5mg  daily  Bring all of your meds, your BP cuff and your record of home blood pressures to your next appointment.  Exercise as you're able, try to walk approximately 30 minutes per day.  Keep salt intake to a minimum, especially watch canned and prepared boxed foods.  Eat more fresh fruits and vegetables and fewer canned items.  Avoid eating in fast food restaurants.    HOW TO TAKE YOUR BLOOD PRESSURE: . Rest 5 minutes before taking your blood pressure. .  Don't smoke or drink caffeinated beverages for at least 30 minutes before. . Take your blood pressure before (not after) you eat. . Sit comfortably with your back supported and both feet on the floor (don't cross your legs). . Elevate your arm to heart level on a table or a desk. . Use the proper sized cuff. It should fit smoothly and snugly around your bare upper arm. There should be enough room to slip a fingertip under the cuff. The bottom edge of the cuff should be 1 inch above the crease of the elbow. . Ideally, take 3 measurements at one sitting and record the average.

## 2016-12-09 NOTE — Progress Notes (Signed)
Patient ID: Elzia Klase                 DOB: 02/26/1946                      MRN: [id number]     HPI:  Jose Stone is a 71 y.o. male patient of Dr [name], with a PMH below who presents today for hypertension clinic for follow up.  He has a longstanding cardiac history including a congenital VSD.  In 2015 he presented to ER with chest pain, a heart cath showed mid OM stenosis and a stent was placed.  He also has atrial fibrillation, on Xarelto, hypertension and hyperlipidemia.  His metoprolol was discontinued in August due to symptoms of fatigue.  He also underwent a sleep study which diagnosed no significant sleep apnea.  Patient had symptomatic hypotension while taking chlorthalidone 25mg  daily.   During last OV chlorthalidone dose clarified to patient. He is to take 12.5mg  daily not 25mg .  Presents today for follow up and denies headaches, swelling or dizziness.  Current HTN meds:  Valsartan 320mg  daily Chlorthalidone 12.5mg  daily  BP goal: 130/80  Family History: Mother, 1 sister and 1 child all with hypertension  Social History: No tobacco, alcohol or caffeine (gave up alcohol and caffeine when went off Adderall)  Diet: No added salt, eats mostly home cooked meals, some starches and red meats, tries to eat more fish/chicken  Exercise:  Walks about 2 miles every other day  Home BP readings: 16 readings; 125/68 average (pulse 60)  Intolerances:   ACEI (unsure which one) caused cough  Wt Readings from Last 3 Encounters:  09/23/16 238 lb 3.2 oz (108 kg)  06/20/16 240 lb (108.9 kg)  06/05/16 232 lb (105.2 kg)   BP Readings from Last 3 Encounters:  12/09/16 112/74  11/18/16 (!) 98/54  10/21/16 (!) 154/88   Pulse Readings from Last 3 Encounters:  12/09/16 (!) 51  11/18/16 80  10/21/16 76    Renal function: CrCl cannot be calculated (Patient's most recent lab result is older than the maximum 21 days allowed.).  Past Medical History:  Diagnosis Date  . Atrial fibrillation  ([place])   . CAD (coronary artery disease)   . GERD (gastroesophageal reflux disease)   . Hyperlipidemia    VSD  . Hypertension     Current Outpatient Prescriptions on File Prior to Visit  Medication Sig Dispense Refill  . aspirin 81 MG tablet Take 81 mg by mouth daily.    Marland Kitchen atorvastatin (LIPITOR) 80 MG tablet Take 1 tablet (80 mg total) by mouth daily at 6 PM. 90 tablet 2  . b complex vitamins capsule Take 1 capsule by mouth daily.    . chlorthalidone (HYGROTON) 25 MG tablet Take 0.5 tablets (12.5 mg total) by mouth daily. 15 tablet 3  . cholecalciferol (VITAMIN D) 1000 UNITS tablet Take 1,000 Units by mouth daily.     Marland Kitchen COLCRYS 0.6 MG tablet Take 0.6 mg by mouth daily.     . folic acid (FOLVITE) A999333 MCG tablet Take 800 mcg by mouth 2 (two) times daily.    . hydroxypropyl methylcellulose / hypromellose (ISOPTO TEARS / GONIOVISC) 2.5 % ophthalmic solution Place 1 drop into both eyes 4 (four) times daily.    Marland Kitchen loratadine (CLARITIN) 10 MG tablet Take 10 mg by mouth daily as needed for allergies.    . nitroGLYCERIN (NITROSTAT) 0.4 MG SL tablet Place 1 tablet (0.4 mg total) under the  tongue every 5 (five) minutes as needed for chest pain. Max 3 doses. 25 tablet 3  . Omega-3 Fat Ac-Cholecalciferol (DRY EYE OMEGA BENEFITS/VIT D-3 PO) Take 4 capsules by mouth daily. Patient takes 4 daily, spreads them out throughout the day    . PARoxetine (PAXIL) 20 MG tablet Take 0.5 tablets (10 mg total) by mouth every other day. 30 tablet 0  . PATADAY 0.2 % SOLN Place 1 drop into both eyes every evening.     . ranitidine (ZANTAC) 150 MG tablet Take 150 mg by mouth 2 (two) times daily.    . valsartan (DIOVAN) 320 MG tablet Take 1 tablet (320 mg total) by mouth daily. 30 tablet 3  . XARELTO 20 MG TABS tablet TAKE 1 TABLET BY MOUTH EVERY DAY WITH SUPPER 90 tablet 1   No current facility-administered medications on file prior to visit.     Allergies  Allergen Reactions  . Neosporin [Neomycin-Bacitracin  Zn-Polymyx] Rash  . Percocet [Oxycodone-Acetaminophen] Rash    Patient can tolerate acetaminophen solely  . Vicodin [Hydrocodone-Acetaminophen] Rash    Patient can tolerate acetaminophen solely    Blood pressure 112/74, pulse (!) 51, temperature 97 F (36.1 C).  Essential Hypertension: BP currently under control.  Systolic BP range [id number] and diastolic BP range [id number].  Pulse range 52-69 is normal for this patient. He is NOT on beta-blocker and denies symptoms associated with it.  Will continue current therapy of valsartan 320mg  daily and chlorthalidone 12.5mg  daily. Follow up with HTN clinic as needed.   Kenzee Bassin Rodriguez-Guzman PharmD, [facility] [place] [address] 12/09/2016 9:30 PM

## 2016-12-16 ENCOUNTER — Telehealth: Payer: Self-pay | Admitting: Internal Medicine

## 2016-12-16 NOTE — Telephone Encounter (Signed)
°*  STAT* If patient is at the pharmacy, call can be transferred to refill team.   1. Which medications need to be refilled? (please list name of each medication and dose if known) Paroxetine, 20 mg  2. Which pharmacy/location (including street and city if local pharmacy) is medication to be sent to? [id number] - [place], [place] - [address]  3. Do they need a 30 day or 90 day supply? [place]

## 2016-12-16 NOTE — Telephone Encounter (Signed)
Spoke to Mr. Cazes - paxil was most recently filled by PCP, Dr. [name] has filled for him in the past when he was between PCPs. Pt advised to contact Dr. Ammie Ferrier office for refill, he voiced understanding and thanks.

## 2016-12-30 DIAGNOSIS — Z85820 Personal history of malignant melanoma of skin: Secondary | ICD-10-CM | POA: Diagnosis not present

## 2016-12-30 DIAGNOSIS — D1801 Hemangioma of skin and subcutaneous tissue: Secondary | ICD-10-CM | POA: Diagnosis not present

## 2016-12-30 DIAGNOSIS — D225 Melanocytic nevi of trunk: Secondary | ICD-10-CM | POA: Diagnosis not present

## 2016-12-30 DIAGNOSIS — D2272 Melanocytic nevi of left lower limb, including hip: Secondary | ICD-10-CM | POA: Diagnosis not present

## 2016-12-30 DIAGNOSIS — L821 Other seborrheic keratosis: Secondary | ICD-10-CM | POA: Diagnosis not present

## 2016-12-30 DIAGNOSIS — L570 Actinic keratosis: Secondary | ICD-10-CM | POA: Diagnosis not present

## 2016-12-30 DIAGNOSIS — D485 Neoplasm of uncertain behavior of skin: Secondary | ICD-10-CM | POA: Diagnosis not present

## 2016-12-30 DIAGNOSIS — D2271 Melanocytic nevi of right lower limb, including hip: Secondary | ICD-10-CM | POA: Diagnosis not present

## 2017-01-13 ENCOUNTER — Other Ambulatory Visit: Payer: Self-pay | Admitting: Internal Medicine

## 2017-01-13 NOTE — Telephone Encounter (Signed)
Rx(s) sent to pharmacy electronically.  

## 2017-01-16 ENCOUNTER — Other Ambulatory Visit: Payer: Self-pay

## 2017-01-16 ENCOUNTER — Other Ambulatory Visit: Payer: Self-pay | Admitting: Internal Medicine

## 2017-01-16 MED ORDER — CHLORTHALIDONE 25 MG PO TABS: 12.5000 mg | ORAL_TABLET | Freq: Every day | ORAL | 3 refills | Status: DC

## 2017-02-23 ENCOUNTER — Other Ambulatory Visit: Payer: Self-pay | Admitting: Internal Medicine

## 2017-02-26 ENCOUNTER — Other Ambulatory Visit: Payer: Self-pay | Admitting: Internal Medicine

## 2017-03-09 DIAGNOSIS — Z0000 Encounter for general adult medical examination without abnormal findings: Secondary | ICD-10-CM | POA: Diagnosis not present

## 2017-03-09 DIAGNOSIS — R55 Syncope and collapse: Secondary | ICD-10-CM | POA: Diagnosis not present

## 2017-03-09 DIAGNOSIS — E6609 Other obesity due to excess calories: Secondary | ICD-10-CM | POA: Diagnosis not present

## 2017-03-09 DIAGNOSIS — E785 Hyperlipidemia, unspecified: Secondary | ICD-10-CM | POA: Diagnosis not present

## 2017-03-09 DIAGNOSIS — M1000 Idiopathic gout, unspecified site: Secondary | ICD-10-CM | POA: Diagnosis not present

## 2017-03-09 DIAGNOSIS — Z6832 Body mass index (BMI) 32.0-32.9, adult: Secondary | ICD-10-CM | POA: Diagnosis not present

## 2017-03-09 DIAGNOSIS — Z955 Presence of coronary angioplasty implant and graft: Secondary | ICD-10-CM | POA: Diagnosis not present

## 2017-03-09 DIAGNOSIS — Z298 Encounter for other specified prophylactic measures: Secondary | ICD-10-CM | POA: Diagnosis not present

## 2017-03-09 DIAGNOSIS — Z79899 Other long term (current) drug therapy: Secondary | ICD-10-CM | POA: Diagnosis not present

## 2017-03-09 DIAGNOSIS — I2510 Atherosclerotic heart disease of native coronary artery without angina pectoris: Secondary | ICD-10-CM | POA: Diagnosis not present

## 2017-03-20 ENCOUNTER — Telehealth: Payer: Self-pay | Admitting: Internal Medicine

## 2017-03-20 NOTE — Telephone Encounter (Signed)
Received records from [place] for appointment on 03/31/17 with Dr [name].  Records put with Dr Lysbeth Penner schedule for 03/31/17. lp

## 2017-03-24 ENCOUNTER — Encounter: Payer: Self-pay | Admitting: *Deleted

## 2017-03-24 ENCOUNTER — Telehealth: Payer: Self-pay | Admitting: Internal Medicine

## 2017-03-24 NOTE — Telephone Encounter (Signed)
I can't view [facility] - please obtain labs prior to the visit for my review. They will likely be useable, but not sure what he had.  Dr. Lemmie Evens

## 2017-03-24 NOTE — Telephone Encounter (Signed)
Patient recently had labs drawn at his PCP (5/7). They can be accessed through [facility]. He would like to know if these are adequate for his appointment on 5/29. Will route to the physician.

## 2017-03-24 NOTE — Telephone Encounter (Signed)
This encounter was created in error - please disregard.

## 2017-03-24 NOTE — Telephone Encounter (Signed)
Labs will be faxed by the physician's office, per the patient.

## 2017-03-24 NOTE — Telephone Encounter (Signed)
New Message  Pt voiced he was suppose to have fasting labs and had physicals on 5.7.18 with lab work and pt calling to see if it's okay if we can use those labs.  Please f/u

## 2017-03-25 ENCOUNTER — Other Ambulatory Visit: Payer: Self-pay

## 2017-03-31 ENCOUNTER — Encounter: Payer: Self-pay | Admitting: Internal Medicine

## 2017-03-31 ENCOUNTER — Ambulatory Visit (INDEPENDENT_AMBULATORY_CARE_PROVIDER_SITE_OTHER): Payer: Medicare Other | Admitting: Internal Medicine

## 2017-03-31 VITALS — BP 112/60 | HR 65 | Ht 73.0 in | Wt 241.0 lb

## 2017-03-31 DIAGNOSIS — I351 Nonrheumatic aortic (valve) insufficiency: Secondary | ICD-10-CM

## 2017-03-31 DIAGNOSIS — I77810 Thoracic aortic ectasia: Secondary | ICD-10-CM | POA: Diagnosis not present

## 2017-03-31 DIAGNOSIS — Q210 Ventricular septal defect: Secondary | ICD-10-CM

## 2017-03-31 DIAGNOSIS — I2510 Atherosclerotic heart disease of native coronary artery without angina pectoris: Secondary | ICD-10-CM

## 2017-03-31 DIAGNOSIS — I2583 Coronary atherosclerosis due to lipid rich plaque: Secondary | ICD-10-CM

## 2017-03-31 DIAGNOSIS — I10 Essential (primary) hypertension: Secondary | ICD-10-CM

## 2017-03-31 DIAGNOSIS — I480 Paroxysmal atrial fibrillation: Secondary | ICD-10-CM | POA: Diagnosis not present

## 2017-03-31 NOTE — Progress Notes (Signed)
OFFICE NOTE  Chief Complaint:  Routine follow-up  Primary Care Physician: Kathyrn Lass, MD  HPI:  Jose Stone is a very pleasant 71 year-old male who is a former Civil engineer, contracting. 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 This is never caused issues in fact she's had right and left heart catheterizations dating back to the 1970s. 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 Other coronary risk factors include hypertension, dyslipidemia and family history of heart disease. He was sent for cardiac catheterization in 2006 for follow-up of VSD, at which time he had an echocardiogram which showed mild aortic regurgitation, a small membranous VSD and left atrial enlargement with an EF of 70%. He had a stress test at that time which showed findings concerning for him for a septal and inferoapical ischemia. He therefore was referred to left heart catheterization with strep demonstrated a 50-70% mid LAD stenosis and mild disease of the mid circumflex and first obtuse marginal branches. He's been managed medically since that time and previously saw Dr. Daneen Schick.  Recently he's been having some symptoms of sweating, palpitations and dizziness and eventually was found to be in atrial fibrillation when he presented to the hospital. At that time his troponin was elevated. He is placed on diltiazem which slowed his heart rate and he converted back to sinus spontaneously. Based on his elevated troponin, he was referred for left heart catheterization by Dr. Doylene Canard. This demonstrated a 60-70% mid LAD stenosis and a 90% obtuse marginal stenosis. Subsequently he underwent coronary intervention with placement of a 2.5 x 15 mm Xience Alpine stent to the mid OM. After this he became asymptomatic. He was going to have stress testing to determine the significance of his mid LAD lesion however it appears to be not significantly changed since his  heart catheterization 2006. He reports no further symptoms of atrial fibrillation but is concerned about bradycardia. He was not referred for cardiac rehabilitation due to the thought that he may need additional intervention to the LAD.  Jose Stone returns today for follow-up. She's done quite well in cardiac rehabilitation. At his last office visit we discontinued his diltiazem for bradycardia. He still has a low heart rate which has some degree of chronotropic incompetence. He reports no recurrent atrial fibrillation that were aware of. He continues on dual antiplatelet therapy. He had a stress test prior to enrolling in cardiac redilatation which was negative for ischemia.  I saw Jose Stone back in the office today. He is accompanied by his wife who was a cardiac nurse at the [employer] in [place] for a number years in the 1970s. They brought in pronounced of his heart rate monitor and had several questions about his heart rate and arrhythmias. My interpretation of the monitor indicates that he had some episodes of short bursts of atrial tachycardia but no evidence for A. fib. He also has PACs. Heart rate however was a low and remained that way despite stopping his calcium channel blocker. He was recently in the emergency room and was noted to have some paroxysmal tachycardia in the ER with a right bundle-branch pattern. This is what led to monitor placement. He reports fatigue which could be certainly related to his lower heart rate. He also says that he's developed gynecomastia which he relates to [place]. I've done a search on the medicine and do not see that however he reports in  the product labeling.  03/21/2016  Jose Stone was seen today in the office for an acute visit. He noted this morning shortly after waking up his heart rate was elevated in the low 100s. It improved somewhat and then around 9:00 it increased up to about 130. The heart rate is been stable since then and he called in for a new acute  triage visit. He reports feeling somewhat fatigued and noting that his heart rate is elevated. He denies any worsening shortness of breath or chest pain. EKG in the office shows atrial flutter with a regular rhythm at 131 bpm. As per recent notes he was previous on aspirin and clopidogrel but the caput ago was stopped. He was not on triple therapy. Currently he is only on low-dose aspirin. 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 I'm concerned about up titrating his medications because she's had history of slow ventricular rates in the past. We discussed a number of options but ultimately the best option is for him to undergo cardioversion to get back into normal rhythm.  04/17/2016  Jose Stone was seen back in the office today for follow-up. At his last office visit he was noted to be in atrial flutter with a rate of 131. He was referred directly to the ER and underwent cardioversion after being started on Xarelto. He is remained on Xarelto since then without any bleeding problems. I'm happy to say he is maintaining sinus rhythm today. He does report significant fatigue however. He is mostly compliant with CPAP however he had a sleep study a number of years ago and he is set possibly to low of an airway setting. He's had weight gain since then. He also has been having some bradycardia with heart rates in the 40s and 50s. He is on low-dose beta blocker but I been hesitant to stop that because of his RVR. He does report heart rate increases with exercise.   06/20/2016  Jose Stone returns today for follow-up. He underwent a sleep study on 06/05/2016. He did have a diagnosis of sleep apnea however that study indicated a very low AHI of 2.5 events per hour. He had actually about 25 events per hour in rem sleep, but overall the REM sleep latency was only about 8%. Ultimately he was diagnosis not having any significant apnea. This is led to some confusion as he previously was  wearing [place] CPAP device. He reports no change in his symptoms of fatigue which we thought could be related to sleep apnea. It also may be related to bradycardia and his beta blocker.  09/23/2016  Jose Stone comes back today and is doing well. His fatigue has improved somewhat with discontinuing his B-Blocker. Heartrate is increased. I spoke with Dr. Claiborne Billings about the possibility of OSA and he is not felt to have this. Dr. Claiborne Billings noted that RLS was present. He does not desire treatment for this. Blood pressure was somewhat elevated today. EKG shows sinus with PAC's. I reviewed a spreadsheet of his blood pressures today.  03/31/2017  Jose Stone was seen today in follow-up. Overall he is doing very well. He denies any chest pain worsening shortness of breath. Blood pressure is well-controlled. He brought in a graft indicating excellent control blood pressure and heart rate. He said no breakthrough A. fib. His recent lab work was provided by his PCP. Total cholesterol 134, trig Richards 85, HDL-C 49, LDL-C 68, non-HDL of 85. This represents excellent control. He is planning  a trip out [place] is been provided some Diamox to use as needed. He denies any bleeding problems on Xarelto.  PMHx:  Past Medical History:  Diagnosis Date  . Atrial fibrillation ([place])   . CAD (coronary artery disease)   . GERD (gastroesophageal reflux disease)   . Hyperlipidemia    VSD  . Hypertension     Past Surgical History:  Procedure Laterality Date  . CARDIAC CATHETERIZATION    . LEFT HEART CATHETERIZATION WITH CORONARY ANGIOGRAM N/A 08/14/2014   Procedure: LEFT HEART CATHETERIZATION WITH CORONARY ANGIOGRAM;  Surgeon: Birdie Riddle, MD;  Location: [facility];  Service: Cardiovascular;  Laterality: N/A;  . SHOULDER SURGERY    . TONSILLECTOMY  1955    FAMHx:  Family History  Problem Relation Age of Onset  . Pancreatic cancer Mother   . Aortic aneurysm Maternal Grandmother   . Heart attack Maternal Grandfather    . Diabetes Paternal Grandfather   . CAD Paternal Grandfather     SOCHx:   reports that he has never smoked. He has never used smokeless tobacco. He reports that he does not drink alcohol or use drugs.  ALLERGIES:  Allergies  Allergen Reactions  . Neosporin [Neomycin-Bacitracin Zn-Polymyx] Rash  . Percocet [Oxycodone-Acetaminophen] Rash    Patient can tolerate acetaminophen solely  . Vicodin [Hydrocodone-Acetaminophen] Rash    Patient can tolerate acetaminophen solely    ROS: Pertinent items noted in HPI and remainder of comprehensive ROS otherwise negative.  HOME MEDS: Current Outpatient Prescriptions  Medication Sig Dispense Refill  . acetaZOLAMIDE (DIAMOX) 125 MG tablet Take 125 mg by mouth 2 (two) times daily as needed.   0  . aspirin 81 MG tablet Take 81 mg by mouth daily.    Marland Kitchen atorvastatin (LIPITOR) 80 MG tablet TAKE 1 TABLET DAILY AT 6 PM 90 tablet 2  . b complex vitamins capsule Take 1 capsule by mouth daily.    . chlorthalidone (HYGROTON) 25 MG tablet Take 0.5 tablets (12.5 mg total) by mouth daily. 90 tablet 3  . cholecalciferol (VITAMIN D) 1000 UNITS tablet Take 1,000 Units by mouth daily.     Marland Kitchen COLCRYS 0.6 MG tablet Take 0.6 mg by mouth as needed.     . folic acid (FOLVITE) 854 MCG tablet Take 800 mcg by mouth 2 (two) times daily.    . hydroxypropyl methylcellulose / hypromellose (ISOPTO TEARS / GONIOVISC) 2.5 % ophthalmic solution Place 1 drop into both eyes 4 (four) times daily.    Marland Kitchen loratadine (CLARITIN) 10 MG tablet Take 10 mg by mouth daily as needed for allergies.    . nitroGLYCERIN (NITROSTAT) 0.4 MG SL tablet Place 1 tablet (0.4 mg total) under the tongue every 5 (five) minutes as needed for chest pain. Max 3 doses. 25 tablet 3  . Omega-3 Fat Ac-Cholecalciferol (DRY EYE OMEGA BENEFITS/VIT D-3 PO) Take 4 capsules by mouth daily. Patient takes 4 daily, spreads them out throughout the day    . PARoxetine (PAXIL) 20 MG tablet Take 0.5 tablets (10 mg total) by  mouth every other day. 30 tablet 0  . PATADAY 0.2 % SOLN Place 1 drop into both eyes every evening.     . ranitidine (ZANTAC) 150 MG tablet Take 150 mg by mouth 2 (two) times daily.    . valsartan (DIOVAN) 320 MG tablet TAKE 1 TABLET BY MOUTH EVERY DAY 30 tablet 3  . XARELTO 20 MG TABS tablet TAKE 1 TABLET BY MOUTH EVERY DAY WITH SUPPER 90 tablet  1   No current facility-administered medications for this visit.     LABS/IMAGING: No results found for this or any previous visit (from the past 48 hour(s)). No results found.  VITALS: BP 112/60   Pulse 65   Ht 6\' 1"  (1.854 m)   Wt 241 lb (109.3 kg)   BMI 31.80 kg/m   EXAM: General appearance: alert and no distress Lungs: clear to auscultation bilaterally Heart: regular rate and rhythm, S1, S2 normal, systolic murmur: holosystolic 3/6, crescendo at 2nd right intercostal space and diastolic murmur: early diastolic 2/6, crescendo at 2nd right intercostal space Extremities: extremities normal, atraumatic, no cyanosis or edema Neurologic: Grossly normal  EKG: Sinus rhythm with PACs at 65  ASSESSMENT: 1. Paroxysmal atrial flutter - CHADSVASC score of 1 on Xarelto 2. Coronary artery disease status post PCI to the OM with a Xience Alpine DES (2.515 mm) - on low dose Aspirin 3. Residual moderate to severe mid LAD disease 4. Hypertension 5. Dyslipidemia 6. A. fib with RVR-spontaneously converted, possibly related to ischemia (not on triple anticoagulation) 7. Small perimembranous VSD 8. Fatigue-possibly symptomatic bradycardia  9. Trivial AI 10. 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   Jose Stone continues to do well without any recurrent symptoms. He denies any recurrent chest pain. He has had no bleeding problems on Xarelto without recurrent atrial flutter. He does have a history of trivial aortic insufficiency and a murmur by auscultation today. In addition there is a small perimembranous VSD. This was last evaluated in 2015 by  echo. At that time the aortic root was noted to be mildly dilated at 4.1 cm. I would recommend a repeat echo in 6 months prior to his next office visit.  [name], MD, [facility] Attending Cardiologist [place] 03/31/2017, 10:02 AM

## 2017-03-31 NOTE — Patient Instructions (Addendum)
Your physician wants you to follow-up in: 6 months with Dr. [name]. You will receive a reminder letter in the mail two months in advance. If you don't receive a letter, please call our office to schedule the follow-up appointment.  Your physician has requested that you have an echocardiogram in 6 months @ [address]. Echocardiography is a painless test that uses sound waves to create images of your heart. It provides your doctor with information about the size and shape of your heart and how well your heart's chambers and valves are working. This procedure takes approximately one hour. There are no restrictions for this procedure.

## 2017-05-07 ENCOUNTER — Other Ambulatory Visit: Payer: Self-pay | Admitting: Internal Medicine

## 2017-05-15 ENCOUNTER — Other Ambulatory Visit: Payer: Self-pay | Admitting: Internal Medicine

## 2017-06-02 DIAGNOSIS — L03116 Cellulitis of left lower limb: Secondary | ICD-10-CM | POA: Diagnosis not present

## 2017-06-02 DIAGNOSIS — S91142A Puncture wound with foreign body of left great toe without damage to nail, initial encounter: Secondary | ICD-10-CM | POA: Diagnosis not present

## 2017-06-05 ENCOUNTER — Other Ambulatory Visit: Payer: Self-pay | Admitting: Internal Medicine

## 2017-06-05 DIAGNOSIS — L089 Local infection of the skin and subcutaneous tissue, unspecified: Secondary | ICD-10-CM | POA: Diagnosis not present

## 2017-06-10 DIAGNOSIS — L089 Local infection of the skin and subcutaneous tissue, unspecified: Secondary | ICD-10-CM | POA: Diagnosis not present

## 2017-06-10 DIAGNOSIS — I10 Essential (primary) hypertension: Secondary | ICD-10-CM | POA: Diagnosis not present

## 2017-06-24 DIAGNOSIS — R05 Cough: Secondary | ICD-10-CM | POA: Diagnosis not present

## 2017-06-24 DIAGNOSIS — I10 Essential (primary) hypertension: Secondary | ICD-10-CM | POA: Diagnosis not present

## 2017-06-24 DIAGNOSIS — L089 Local infection of the skin and subcutaneous tissue, unspecified: Secondary | ICD-10-CM | POA: Diagnosis not present

## 2017-06-24 DIAGNOSIS — Z23 Encounter for immunization: Secondary | ICD-10-CM | POA: Diagnosis not present

## 2017-06-24 DIAGNOSIS — L97529 Non-pressure chronic ulcer of other part of left foot with unspecified severity: Secondary | ICD-10-CM | POA: Diagnosis not present

## 2017-06-29 ENCOUNTER — Telehealth: Payer: Self-pay | Admitting: Internal Medicine

## 2017-06-29 MED ORDER — IRBESARTAN 150 MG PO TABS: 150.0000 mg | ORAL_TABLET | Freq: Every day | ORAL | 3 refills | Status: DC

## 2017-06-29 NOTE — Telephone Encounter (Signed)
Patient called in due to the valsartan recall. He has been on losartan previously but was switched to valsartan due to increased blood pressure problems. The patient is currently on valsartan 160 mg and not 320 mg. His PCP had decreased it a month ago due to decreased blood pressures.  Per the protocol from pharmacy, irbesartan 150 mg has been called in for him. He has been intructed to continue to check his blood pressure and to call the office back if it goes too high or low. He verbalized his understanding.

## 2017-06-29 NOTE — Telephone Encounter (Signed)
New message    Pt is calling with a question about his refill.   Pt c/o medication issue:  1. Name of Medication: valsartan   2. How are you currently taking this medication (dosage and times per day)? 160 mg  3. Are you having a reaction (difficulty breathing--STAT)? No   4. What is your medication issue? Medication has been recalled. He states that his PCP was going to put him on another medication that he was on before but it's the medication Dr. [name] took him off of to put him on Valsartan. Please call.

## 2017-07-08 ENCOUNTER — Other Ambulatory Visit: Payer: Self-pay | Admitting: *Deleted

## 2017-07-08 MED ORDER — NITROGLYCERIN 0.4 MG SL SUBL: 0.4000 mg | SUBLINGUAL_TABLET | SUBLINGUAL | 5 refills | Status: DC | PRN

## 2017-07-09 ENCOUNTER — Encounter (HOSPITAL_BASED_OUTPATIENT_CLINIC_OR_DEPARTMENT_OTHER): Payer: Medicare Other | Attending: Internal Medicine

## 2017-07-09 DIAGNOSIS — L97521 Non-pressure chronic ulcer of other part of left foot limited to breakdown of skin: Secondary | ICD-10-CM | POA: Insufficient documentation

## 2017-07-09 DIAGNOSIS — S91102A Unspecified open wound of left great toe without damage to nail, initial encounter: Secondary | ICD-10-CM | POA: Diagnosis not present

## 2017-07-09 DIAGNOSIS — I739 Peripheral vascular disease, unspecified: Secondary | ICD-10-CM | POA: Insufficient documentation

## 2017-07-09 DIAGNOSIS — I10 Essential (primary) hypertension: Secondary | ICD-10-CM | POA: Insufficient documentation

## 2017-07-09 DIAGNOSIS — Z7982 Long term (current) use of aspirin: Secondary | ICD-10-CM | POA: Diagnosis not present

## 2017-07-09 DIAGNOSIS — G4733 Obstructive sleep apnea (adult) (pediatric): Secondary | ICD-10-CM | POA: Diagnosis not present

## 2017-07-09 DIAGNOSIS — I2510 Atherosclerotic heart disease of native coronary artery without angina pectoris: Secondary | ICD-10-CM | POA: Insufficient documentation

## 2017-07-09 DIAGNOSIS — G629 Polyneuropathy, unspecified: Secondary | ICD-10-CM | POA: Insufficient documentation

## 2017-07-09 DIAGNOSIS — I480 Paroxysmal atrial fibrillation: Secondary | ICD-10-CM | POA: Insufficient documentation

## 2017-07-09 DIAGNOSIS — Z7901 Long term (current) use of anticoagulants: Secondary | ICD-10-CM | POA: Insufficient documentation

## 2017-07-09 DIAGNOSIS — L03116 Cellulitis of left lower limb: Secondary | ICD-10-CM | POA: Insufficient documentation

## 2017-07-16 DIAGNOSIS — L03116 Cellulitis of left lower limb: Secondary | ICD-10-CM | POA: Diagnosis not present

## 2017-07-16 DIAGNOSIS — L97521 Non-pressure chronic ulcer of other part of left foot limited to breakdown of skin: Secondary | ICD-10-CM | POA: Diagnosis not present

## 2017-07-16 DIAGNOSIS — I2510 Atherosclerotic heart disease of native coronary artery without angina pectoris: Secondary | ICD-10-CM | POA: Diagnosis not present

## 2017-07-16 DIAGNOSIS — G4733 Obstructive sleep apnea (adult) (pediatric): Secondary | ICD-10-CM | POA: Diagnosis not present

## 2017-07-16 DIAGNOSIS — I739 Peripheral vascular disease, unspecified: Secondary | ICD-10-CM | POA: Diagnosis not present

## 2017-07-16 DIAGNOSIS — I10 Essential (primary) hypertension: Secondary | ICD-10-CM | POA: Diagnosis not present

## 2017-07-16 DIAGNOSIS — L89892 Pressure ulcer of other site, stage 2: Secondary | ICD-10-CM | POA: Diagnosis not present

## 2017-07-21 ENCOUNTER — Other Ambulatory Visit: Payer: Self-pay | Admitting: *Deleted

## 2017-07-21 ENCOUNTER — Encounter (HOSPITAL_COMMUNITY): Payer: Self-pay | Admitting: *Deleted

## 2017-07-21 ENCOUNTER — Emergency Department (HOSPITAL_COMMUNITY): Payer: Medicare Other

## 2017-07-21 ENCOUNTER — Emergency Department (HOSPITAL_COMMUNITY)
Admission: EM | Admit: 2017-07-21 | Discharge: 2017-07-21 | Disposition: A | Discharge status: Home / Self Care | Payer: Medicare Other | Attending: Emergency Medicine | Admitting: Emergency Medicine

## 2017-07-21 DIAGNOSIS — Z7982 Long term (current) use of aspirin: Secondary | ICD-10-CM | POA: Insufficient documentation

## 2017-07-21 DIAGNOSIS — I2510 Atherosclerotic heart disease of native coronary artery without angina pectoris: Secondary | ICD-10-CM | POA: Insufficient documentation

## 2017-07-21 DIAGNOSIS — R0789 Other chest pain: Secondary | ICD-10-CM | POA: Insufficient documentation

## 2017-07-21 DIAGNOSIS — E785 Hyperlipidemia, unspecified: Secondary | ICD-10-CM | POA: Insufficient documentation

## 2017-07-21 DIAGNOSIS — I4510 Unspecified right bundle-branch block: Secondary | ICD-10-CM | POA: Diagnosis not present

## 2017-07-21 DIAGNOSIS — R079 Chest pain, unspecified: Secondary | ICD-10-CM | POA: Diagnosis present

## 2017-07-21 DIAGNOSIS — R42 Dizziness and giddiness: Secondary | ICD-10-CM | POA: Diagnosis not present

## 2017-07-21 DIAGNOSIS — Z79899 Other long term (current) drug therapy: Secondary | ICD-10-CM | POA: Diagnosis not present

## 2017-07-21 DIAGNOSIS — I480 Paroxysmal atrial fibrillation: Secondary | ICD-10-CM | POA: Diagnosis not present

## 2017-07-21 LAB — CBC
HCT: 45.2 % (ref 39.0–52.0)
HEMOGLOBIN: 15.3 g/dL (ref 13.0–17.0)
MCH: 29.1 pg (ref 26.0–34.0)
MCHC: 33.8 g/dL (ref 30.0–36.0)
MCV: 85.9 fL (ref 78.0–100.0)
Platelets: 213 10*3/uL (ref 150–400)
RBC: 5.26 MIL/uL (ref 4.22–5.81)
RDW: 14.5 % (ref 11.5–15.5)
WBC: 8.9 10*3/uL (ref 4.0–10.5)

## 2017-07-21 LAB — CBG MONITORING, ED: Glucose-Capillary: 100 mg/dL — ABNORMAL HIGH (ref 65–99)

## 2017-07-21 LAB — BASIC METABOLIC PANEL
ANION GAP: 6 (ref 5–15)
BUN: 22 mg/dL — ABNORMAL HIGH (ref 6–20)
CHLORIDE: 106 mmol/L (ref 101–111)
CO2: 26 mmol/L (ref 22–32)
CREATININE: 1.1 mg/dL (ref 0.61–1.24)
Calcium: 9.3 mg/dL (ref 8.9–10.3)
GFR calc non Af Amer: >60 mL/min (ref >60)
GLUCOSE: 100 mg/dL — AB (ref 65–99)
Potassium: 3.6 mmol/L (ref 3.5–5.1)
Sodium: 138 mmol/L (ref 135–145)

## 2017-07-21 LAB — I-STAT TROPONIN, ED: Troponin i, poc: 0 ng/mL (ref 0.00–0.08)

## 2017-07-21 LAB — TROPONIN I

## 2017-07-21 NOTE — Consult Note (Signed)
Cardiology Consult Note  Patient Name: Jose Stone Date of Encounter: 07/21/2017 Primary Care Physician: Kathyrn Lass, MD Cardiologist: [facility] Problem List   Principal Problem:   Dizziness Active Problems:   RBBB (right bundle branch block)   PAF (paroxysmal atrial fibrillation) [facility])    Chief Complaint    Dizziness, hypertension, left shoulder blade pain  HPI  Jose Stone is a 71 y.o. male who is being seen today for the evaluation of chest pain and dizziness at the request of Dr. Vanita Panda. Mr. 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 This is never caused issues in fact he's had right and left heart catheterizations dating back to the 1970s. 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 Other coronary risk factors include hypertension, dyslipidemia and family history of heart disease. He was sent for cardiac catheterization in 2006 for follow-up of VSD, at which time he had an echocardiogram which showed mild aortic regurgitation, a small membranous VSD and left atrial enlargement with an EF of 70%. He had a stress test at that time which showed findings concerning for him for a septal and inferoapical ischemia. He therefore was referred to left heart catheterization with strep demonstrated a 50-70% mid LAD stenosis and mild disease of the mid circumflex and first obtuse marginal branches. He's been managed medically since that time and previously saw Dr. Daneen Schick. In 2015, he had been having some symptoms of sweating, palpitations and dizziness and eventually was found to be in atrial fibrillation when he presented to the hospital. At that time his troponin was elevated. He is placed on diltiazem which slowed his heart rate and he converted back to sinus spontaneously. Based on his elevated troponin, he was referred for left heart catheterization by Dr. Doylene Canard. This  demonstrated a 60-70% mid LAD stenosis and a 90% obtuse marginal stenosis. Subsequently he underwent coronary intervention with placement of a 2.5 x 15 mm Xience Alpine stent to the mid OM. After this he became asymptomatic. He was going to have stress testing to determine the significance of his mid LAD lesion however it appears to be not significantly changed since his heart catheterization 2006. Since then, he has done well without anginal complaints.  He presents today with onset of dizziness that started last evening. He says it feels like somewhat of an imbalance. He denies any vertiginous symptoms, associated nausea or changes in vision. Additionally he noted his blood pressure was markedly elevated. He then was having pain in the left shoulder blade but denied any substernal chest pain. This is a different symptom that he's had in the past. He was not aware of any tachycardia or recurrent symptoms concerning for atrial fibrillation. EKG today was personally reviewed which shows an incomplete right bundle branch block but otherwise sinus rhythm without obvious ischemic changes. Initial troponin was negative. Laboratory work is otherwise unremarkable. CXR shows NAD.  PMHx   Past Medical History:  Diagnosis Date  . Atrial fibrillation ([place])   . CAD (coronary artery disease)   . GERD (gastroesophageal reflux disease)   . Hyperlipidemia    VSD  . Hypertension     Past Surgical History:  Procedure Laterality Date  . CARDIAC CATHETERIZATION    . LEFT HEART CATHETERIZATION WITH CORONARY ANGIOGRAM N/A 08/14/2014   Procedure: LEFT HEART CATHETERIZATION WITH CORONARY ANGIOGRAM;  Surgeon: Birdie Riddle, MD;  Location: [facility]  CATH LAB;  Service: Cardiovascular;  Laterality: N/A;  . SHOULDER SURGERY    . TONSILLECTOMY  1955    FAMHx   Family History  Problem Relation Age of Onset  . Pancreatic cancer Mother   . Aortic aneurysm Maternal Grandmother   . Heart attack Maternal Grandfather   .  Diabetes Paternal Grandfather   . CAD Paternal Grandfather     SOCHx    reports that he has never smoked. He has never used smokeless tobacco. He reports that he does not drink alcohol or use drugs.  Outpatient Medications   No current facility-administered medications on file prior to encounter.    Current Outpatient Prescriptions on File Prior to Encounter  Medication Sig Dispense Refill  . acetaZOLAMIDE (DIAMOX) 125 MG tablet Take 125 mg by mouth 2 (two) times daily as needed.   0  . aspirin 81 MG tablet Take 81 mg by mouth daily.    Marland Kitchen atorvastatin (LIPITOR) 80 MG tablet TAKE 1 TABLET DAILY AT 6 PM 90 tablet 2  . b complex vitamins capsule Take 1 capsule by mouth daily.    . chlorthalidone (HYGROTON) 25 MG tablet Take 0.5 tablets (12.5 mg total) by mouth daily. 90 tablet 3  . cholecalciferol (VITAMIN D) 1000 UNITS tablet Take 1,000 Units by mouth daily.     Marland Kitchen COLCRYS 0.6 MG tablet Take 0.6 mg by mouth as needed.     . folic acid (FOLVITE) 606 MCG tablet Take 800 mcg by mouth 2 (two) times daily.    . hydroxypropyl methylcellulose / hypromellose (ISOPTO TEARS / GONIOVISC) 2.5 % ophthalmic solution Place 1 drop into both eyes 4 (four) times daily.    . irbesartan (AVAPRO) 150 MG tablet Take 1 tablet (150 mg total) by mouth daily. 90 tablet 3  . loratadine (CLARITIN) 10 MG tablet Take 10 mg by mouth daily as needed for allergies.    . nitroGLYCERIN (NITROSTAT) 0.4 MG SL tablet Place 1 tablet (0.4 mg total) under the tongue every 5 (five) minutes as needed for chest pain. Max 3 doses. 25 tablet 5  . Omega-3 Fat Ac-Cholecalciferol (DRY EYE OMEGA BENEFITS/VIT D-3 PO) Take 4 capsules by mouth daily. Patient takes 4 daily, spreads them out throughout the day    . PARoxetine (PAXIL) 20 MG tablet Take 0.5 tablets (10 mg total) by mouth every other day. 30 tablet 0  . PATADAY 0.2 % SOLN Place 1 drop into both eyes every evening.     . ranitidine (ZANTAC) 150 MG tablet Take 150 mg by mouth 2  (two) times daily.    Alveda Reasons 20 MG TABS tablet TAKE 1 TABLET BY MOUTH EVERY DAY WITH SUPPER 90 tablet 1    Inpatient Medications    Scheduled Meds:   Continuous Infusions:   PRN Meds:    ALLERGIES   Allergies  Allergen Reactions  . Neosporin [Neomycin-Bacitracin Zn-Polymyx] Rash  . Percocet [Oxycodone-Acetaminophen] Rash    Patient can tolerate acetaminophen solely  . Vicodin [Hydrocodone-Acetaminophen] Rash    Patient can tolerate acetaminophen solely    ROS   Pertinent items noted in HPI and remainder of comprehensive ROS otherwise negative.  Vitals   Vitals:   07/21/17 0943  BP: 107/84  Pulse: (!) 59  Resp: 18  Temp: 98 F (36.7 C)  TempSrc: Oral  SpO2: 93%   No intake or output data in the 24 hours ending 07/21/17 1046 There were no vitals filed for this visit.  Physical Exam  General appearance: alert and no distress Neck: no carotid bruit, no JVD and thyroid not enlarged, symmetric, no tenderness/mass/nodules Lungs: clear to auscultation bilaterally Heart: regular rate and rhythm, S1, S2 normal and systolic murmur: holosystolic 3/6, blowing at lower left sternal border Abdomen: soft, non-tender; bowel sounds normal; no masses,  no organomegaly Extremities: extremities normal, atraumatic, no cyanosis or edema Pulses: 2+ and symmetric Skin: Skin color, texture, turgor normal. No rashes or lesions Neurologic: Grossly normal Psych: Pleasant  Labs   Results for orders placed or performed during the hospital encounter of 07/21/17 (from the past 48 hour(s))  Basic metabolic panel     Status: Abnormal   Collection Time: 07/21/17  9:44 AM  Result Value Ref Range   Sodium 138 135 - 145 mmol/L   Potassium 3.6 3.5 - 5.1 mmol/L   Chloride 106 101 - 111 mmol/L   CO2 26 22 - 32 mmol/L   Glucose, Bld 100 (H) 65 - 99 mg/dL   BUN 22 (H) 6 - 20 mg/dL   Creatinine, Ser 1.10 0.61 - 1.24 mg/dL   Calcium 9.3 8.9 - 10.3 mg/dL   GFR calc non Af Amer >60 >60  mL/min   GFR calc Af Amer >60 >60 mL/min    Comment: (NOTE) The eGFR has been calculated using the CKD EPI equation. This calculation has not been validated in all clinical situations. eGFR's persistently <60 mL/min signify possible Chronic Kidney Disease.    Anion gap 6 5 - 15  CBC     Status: None   Collection Time: 07/21/17  9:44 AM  Result Value Ref Range   WBC 8.9 4.0 - 10.5 K/uL   RBC 5.26 4.22 - 5.81 MIL/uL   Hemoglobin 15.3 13.0 - 17.0 g/dL   HCT 45.2 39.0 - 52.0 %   MCV 85.9 78.0 - 100.0 fL   MCH 29.1 26.0 - 34.0 pg   MCHC 33.8 30.0 - 36.0 g/dL   RDW 14.5 11.5 - 15.5 %   Platelets 213 150 - 400 K/uL  I-stat troponin, ED     Status: None   Collection Time: 07/21/17  9:53 AM  Result Value Ref Range   Troponin i, poc 0.00 0.00 - 0.08 ng/mL   Comment 3            Comment: Due to the release kinetics of cTnI, a negative result within the first hours of the onset of symptoms does not rule out myocardial infarction with certainty. If myocardial infarction is still suspected, repeat the test at appropriate intervals.   CBG monitoring, ED     Status: Abnormal   Collection Time: 07/21/17  9:55 AM  Result Value Ref Range   Glucose-Capillary 100 (H) 65 - 99 mg/dL    ECG   NSR with incomplete RBBB - Personally Reviewed  Telemetry   Sinus rhythm - Personally Reviewed  Radiology   No results found.  Cardiac Studies   N/A  Assessment   Principal Problem:   Dizziness Active Problems:   RBBB (right bundle branch block)   PAF (paroxysmal atrial fibrillation) ([place])   Plan   1. Mr. Finelli described mostly dizziness and associated hypertension. He Denies frank substernal chest pain and took 2 nitroglycerin, which she has not taken in several years. He says this did not significant improve his discomfort. It did however bring his blood pressure down. I'm concerned that his symptoms may be related to paroxysmal atrial fibrillation although he is in sinus rhythm.  Initial troponin  is negative. There are no acute ischemic changes on his EKG. I would recommend a subsequent troponin in about 4 hours. If that is negative, then he could be safely discharged. Will arrange for follow-up in our office including an outpatient nuclear stress test as well as a 2 week event monitor to look for any occult atrial fibrillation.  Thanks for the consult.  Time Spent Directly with Patient:  I have spent a total of 35 minutes with patient reviewing hospital notes, telemetry, EKGs, labs and examining the patient as well as establishing an assessment and plan that was discussed with the patient. > 50% of time was spent in direct patient care.  Length of Stay:  LOS: 0 days   [name], MD, [place]  Attending Cardiologist  Direct Dial: [phone]  Fax: [phone]  Website: [email or web address] Enoc Getter 07/21/2017, 10:46 AM

## 2017-07-21 NOTE — Discharge Instructions (Signed)
As discussed, your evaluation today has been largely reassuring.  But, it is important that you monitor your condition carefully, and do not hesitate to return to the ED if you develop new, or concerning changes in your condition. ? ?Otherwise, please follow-up with your physician for appropriate ongoing care. ? ?

## 2017-07-21 NOTE — ED Provider Notes (Signed)
[facility] DEPT Provider Note   CSN: [id number] Arrival date & time: 07/21/17  0932     History   Chief Complaint Chief Complaint  Patient presents with  . Chest Pain  . Dizziness    HPI Jose Stone is a 71 y.o. male.  HPI Patient presents with concern of chest pain and dizziness. Symptoms began yesterday, but became worse today, with episodes of chest pain in theleft superior most thorax, and left arm. No focal anterior, sternal chest pain. No syncope, no fever, no chills. Patient notes multiple medical issues including atrial fibrillation, CAD. He states that he takes all medication as directed.    Past Medical History:  Diagnosis Date  . Atrial fibrillation ([place])   . CAD (coronary artery disease)   . GERD (gastroesophageal reflux disease)   . Hyperlipidemia    VSD  . Hypertension     Patient Active Problem List   Diagnosis Date Noted  . Dizziness 07/21/2017  . PAF (paroxysmal atrial fibrillation) ([place]) 07/21/2017  . Aortic valve regurgitation 03/31/2017  . Dilated aortic root ([place]) 03/31/2017  . Symptomatic bradycardia 06/20/2016  . RBBB (right bundle branch block) 06/20/2016  . OSA on CPAP 04/17/2016  . Excessive daytime sleepiness 04/17/2016  . Essential hypertension 11/16/2014  . Hyperlipidemia 11/16/2014  . GERD (gastroesophageal reflux disease) 11/16/2014  . Coronary artery disease due to lipid rich plaque 11/16/2014  . VSD (ventricular septal defect) 11/16/2014  . Coronary artery disease involving native coronary artery of native heart without angina pectoris 11/16/2014    Past Surgical History:  Procedure Laterality Date  . CARDIAC CATHETERIZATION    . LEFT HEART CATHETERIZATION WITH CORONARY ANGIOGRAM N/A 08/14/2014   Procedure: LEFT HEART CATHETERIZATION WITH CORONARY ANGIOGRAM;  Surgeon: Birdie Riddle, MD;  Location: [facility] CATH LAB;  Service: Cardiovascular;  Laterality: N/A;  . SHOULDER SURGERY    . TONSILLECTOMY  1955       Home  Medications    Prior to Admission medications   Medication Sig Start Date End Date Taking? Authorizing Provider  acetaminophen (TYLENOL) 500 MG tablet Take 1,000 mg by mouth every 6 (six) hours as needed for mild pain or headache.   Yes [provider]  aspirin 81 MG tablet Take 81 mg by mouth daily.   Yes [provider]  atorvastatin (LIPITOR) 80 MG tablet TAKE 1 TABLET DAILY AT 6 PM 02/23/17  Yes Hilty, Nadean Corwin, MD  b complex vitamins capsule Take 1 capsule by mouth daily.   Yes [provider]  chlorthalidone (HYGROTON) 25 MG tablet Take 0.5 tablets (12.5 mg total) by mouth daily. 01/16/17  Yes Hilty, Nadean Corwin, MD  cholecalciferol (VITAMIN D) 1000 UNITS tablet Take 1,000 Units by mouth daily.    Yes [provider]  COLCRYS 0.6 MG tablet Take 0.6 mg by mouth as needed (FOR GOUT).  05/26/14  Yes [provider]  folic acid (FOLVITE) 932 MCG tablet Take 400 mcg by mouth 2 (two) times daily.    Yes [provider]  hydroxypropyl methylcellulose / hypromellose (ISOPTO TEARS / GONIOVISC) 2.5 % ophthalmic solution Place 1 drop into both eyes 4 (four) times daily.   Yes [provider]  irbesartan (AVAPRO) 150 MG tablet Take 1 tablet (150 mg total) by mouth daily. 06/29/17  Yes Hilty, Nadean Corwin, MD  loratadine (CLARITIN) 10 MG tablet Take 10 mg by mouth daily as needed for allergies.   Yes [provider]  nitroGLYCERIN (NITROSTAT) 0.4 MG SL tablet Place  1 tablet (0.4 mg total) under the tongue every 5 (five) minutes as needed for chest pain. Max 3 doses. 07/08/17  Yes Hilty, Nadean Corwin, MD  Omega-3 Fat Ac-Cholecalciferol (DRY EYE OMEGA BENEFITS/VIT D-3 PO) Take 2 capsules by mouth 2 (two) times daily. Patient takes 4 daily, spreads them out throughout the day    Yes [provider]  PARoxetine (PAXIL) 20 MG tablet Take 0.5 tablets (10 mg total) by mouth every other day. 05/31/15  Yes Hilty, Nadean Corwin, MD  ranitidine (ZANTAC)  150 MG tablet Take 150 mg by mouth 2 (two) times daily.   Yes [provider]  XARELTO 20 MG TABS tablet TAKE 1 TABLET BY MOUTH EVERY DAY WITH SUPPER 06/08/17  Yes Hilty, Nadean Corwin, MD  acetaZOLAMIDE (DIAMOX) 125 MG tablet Take 125 mg by mouth 2 (two) times daily as needed.  03/13/17   [provider]  PATADAY 0.2 % SOLN Place 1 drop into both eyes every evening.  08/10/14   [provider]    Family History Family History  Problem Relation Age of Onset  . Pancreatic cancer Mother   . Aortic aneurysm Maternal Grandmother   . Heart attack Maternal Grandfather   . Diabetes Paternal Grandfather   . CAD Paternal Grandfather     Social History Social History  Substance Use Topics  . Smoking status: Never Smoker  . Smokeless tobacco: Never Used  . Alcohol use No     Allergies   Neosporin [neomycin-bacitracin zn-polymyx]; Percocet [oxycodone-acetaminophen]; and Vicodin [hydrocodone-acetaminophen]   Review of Systems Review of Systems  Constitutional:       Per HPI, otherwise negative  HENT:       Per HPI, otherwise negative  Respiratory:       Per HPI, otherwise negative  Cardiovascular:       Per HPI, otherwise negative  Gastrointestinal: Negative for vomiting.  Endocrine:       Negative aside from HPI  Genitourinary:       Neg aside from HPI   Musculoskeletal:       Per HPI, otherwise negative  Skin: Negative.   Neurological: Negative for syncope.     Physical Exam Updated Vital Signs BP 127/74   Pulse (!) 59   Temp 98 F (36.7 C) (Oral)   Resp 16   SpO2 100%   Physical Exam  Constitutional: He is oriented to person, place, and time. He appears well-developed. No distress.  HENT:  Head: Normocephalic and atraumatic.  Eyes: Conjunctivae and EOM are normal.  Cardiovascular: Normal rate and regular rhythm.   Pulmonary/Chest: Effort normal. No stridor. No respiratory distress.  Abdominal: He exhibits no distension.  Musculoskeletal: He  exhibits no edema.  Neurological: He is alert and oriented to person, place, and time.  Skin: Skin is warm and dry.  Psychiatric: He has a normal mood and affect.  Nursing note and vitals reviewed.    ED Treatments / Results  Labs (all labs ordered are listed, but only abnormal results are displayed) Labs Reviewed  BASIC METABOLIC PANEL - Abnormal; Notable for the following:       Result Value   Glucose, Bld 100 (*)    BUN 22 (*)    All other components within normal limits  CBG MONITORING, ED - Abnormal; Notable for the following:    Glucose-Capillary 100 (*)    All other components within normal limits  CBC  TROPONIN I  TROPONIN I  I-STAT TROPONIN, ED  EKG  EKG Interpretation  Date/Time:  Tuesday July 21 2017 09:34:41 EDT Ventricular Rate:  69 PR Interval:    QRS Duration: 112 QT Interval:  392 QTC Calculation: 420 R Axis:   -14 Text Interpretation:  Undetermined rhythm Incomplete right bundle branch block No significant change since last tracing Abnormal ekg Confirmed by Carmin Muskrat [phone]) on 07/21/2017 10:02:26 AM       Radiology Dg Chest 2 View  Result Date: 07/21/2017 CLINICAL DATA:  Chest pain. EXAM: CHEST  2 VIEW COMPARISON:  Radiographs of Mar 21, 2016. FINDINGS: The heart size and mediastinal contours are within normal limits. No pneumothorax or pleural effusion is noted. Stable left basilar scarring is noted. No acute pulmonary disease is noted. Old right rib fractures are noted. IMPRESSION: No active cardiopulmonary disease. Electronically Signed   By: Marijo Conception, M.D.   On: 07/21/2017 10:48    Procedures Procedures (including critical care time)  Medications Ordered in ED Medications - No data to display   Initial Impression / Assessment and Plan / ED Course  I have reviewed the triage vital signs and the nursing notes.  Pertinent labs & imaging results that were available during my care of the patient were reviewed by me and  considered in my medical decision making (see chart for details).   after the initial evaluation I discussed patient's case with his cardiologist has seen and evaluated the patient. Patient will have a second troponin after the first result was reassuring, and he has had no additional pain.  3:35 PM Patient awake, alert, in no distress, no recurrence of substantial symptoms. We discussed all findings at length including reassuring serial troponin, nonischemic EKG, reassuring labs, x-ray. Though the etiology for his left arm pain, left upper chest pain is unclear, there is no evidence for ongoing ACS, nor other acute new pathology. Patient will follow up with cardiology, primary care.  Final Clinical Impressions(s) / ED Diagnoses   Final diagnoses:  Atypical chest pain     Carmin Muskrat, MD 07/21/17 1535

## 2017-07-21 NOTE — ED Triage Notes (Addendum)
Pt came in with left shoulder blade pain that is constant and started having dizzy spells yesterday.  Not diabetic.  Pt was alert and oriented on arrival and captured EKG.  Then turned around and patient had eyes shut and stated he had some dizziness.  The patient then got diaphoretic and clammy.  No diabetes.  Pt brought right back to trauma bay. Cardiologist Dr. [name]. Pt did take nitro at home without relief. Pt takes Xarelto for afib

## 2017-07-23 DIAGNOSIS — S91102A Unspecified open wound of left great toe without damage to nail, initial encounter: Secondary | ICD-10-CM | POA: Diagnosis not present

## 2017-07-23 DIAGNOSIS — L03116 Cellulitis of left lower limb: Secondary | ICD-10-CM | POA: Diagnosis not present

## 2017-07-23 DIAGNOSIS — I2510 Atherosclerotic heart disease of native coronary artery without angina pectoris: Secondary | ICD-10-CM | POA: Diagnosis not present

## 2017-07-23 DIAGNOSIS — I739 Peripheral vascular disease, unspecified: Secondary | ICD-10-CM | POA: Diagnosis not present

## 2017-07-23 DIAGNOSIS — L97521 Non-pressure chronic ulcer of other part of left foot limited to breakdown of skin: Secondary | ICD-10-CM | POA: Diagnosis not present

## 2017-07-23 DIAGNOSIS — I10 Essential (primary) hypertension: Secondary | ICD-10-CM | POA: Diagnosis not present

## 2017-07-23 DIAGNOSIS — G4733 Obstructive sleep apnea (adult) (pediatric): Secondary | ICD-10-CM | POA: Diagnosis not present

## 2017-07-30 ENCOUNTER — Telehealth (HOSPITAL_COMMUNITY): Payer: Self-pay

## 2017-07-30 DIAGNOSIS — L89892 Pressure ulcer of other site, stage 2: Secondary | ICD-10-CM | POA: Diagnosis not present

## 2017-07-30 DIAGNOSIS — I10 Essential (primary) hypertension: Secondary | ICD-10-CM | POA: Diagnosis not present

## 2017-07-30 DIAGNOSIS — I739 Peripheral vascular disease, unspecified: Secondary | ICD-10-CM | POA: Diagnosis not present

## 2017-07-30 DIAGNOSIS — L97521 Non-pressure chronic ulcer of other part of left foot limited to breakdown of skin: Secondary | ICD-10-CM | POA: Diagnosis not present

## 2017-07-30 DIAGNOSIS — I2510 Atherosclerotic heart disease of native coronary artery without angina pectoris: Secondary | ICD-10-CM | POA: Diagnosis not present

## 2017-07-30 DIAGNOSIS — G4733 Obstructive sleep apnea (adult) (pediatric): Secondary | ICD-10-CM | POA: Diagnosis not present

## 2017-07-30 DIAGNOSIS — L03116 Cellulitis of left lower limb: Secondary | ICD-10-CM | POA: Diagnosis not present

## 2017-07-30 NOTE — Telephone Encounter (Signed)
Encounter complete. 

## 2017-08-04 ENCOUNTER — Ambulatory Visit (HOSPITAL_COMMUNITY)
Admission: RE | Admit: 2017-08-04 | Discharge: 2017-08-04 | Disposition: A | Discharge status: Home / Self Care | Payer: Medicare Other | Source: Ambulatory Visit | Attending: Cardiovascular Disease | Admitting: Cardiovascular Disease

## 2017-08-04 DIAGNOSIS — R079 Chest pain, unspecified: Secondary | ICD-10-CM

## 2017-08-06 ENCOUNTER — Ambulatory Visit (HOSPITAL_COMMUNITY)
Admission: RE | Admit: 2017-08-06 | Discharge: 2017-08-06 | Disposition: A | Discharge status: Home / Self Care | Payer: Medicare Other | Source: Ambulatory Visit | Attending: Cardiology | Admitting: Cardiology

## 2017-08-06 DIAGNOSIS — R079 Chest pain, unspecified: Secondary | ICD-10-CM | POA: Diagnosis not present

## 2017-08-06 DIAGNOSIS — R9439 Abnormal result of other cardiovascular function study: Secondary | ICD-10-CM | POA: Diagnosis not present

## 2017-08-06 LAB — MYOCARDIAL PERFUSION IMAGING
CHL CUP NUCLEAR SDS: 6
CHL CUP NUCLEAR SRS: 5
CHL CUP NUCLEAR SSS: 9
LV dias vol: 137 mL (ref 62–150)
LVSYSVOL: 61 mL
Peak HR: 83 {beats}/min
Rest HR: 57 {beats}/min
TID: 1.19

## 2017-08-06 MED ORDER — REGADENOSON 0.4 MG/5ML IV SOLN
0.4000 mg | Freq: Once | INTRAVENOUS | Status: AC
  Administered 2017-08-06: 0.4 mg via INTRAVENOUS

## 2017-08-06 MED ORDER — TECHNETIUM TC 99M TETROFOSMIN IV KIT
11.0000 | PACK | Freq: Once | INTRAVENOUS | Status: AC | PRN
  Administered 2017-08-06: 11 via INTRAVENOUS
  Filled 2017-08-06: qty 11

## 2017-08-06 MED ORDER — TECHNETIUM TC 99M TETROFOSMIN IV KIT
30.4000 | PACK | Freq: Once | INTRAVENOUS | Status: AC | PRN
  Administered 2017-08-06: 30.4 via INTRAVENOUS
  Filled 2017-08-06: qty 31

## 2017-08-13 ENCOUNTER — Encounter (HOSPITAL_BASED_OUTPATIENT_CLINIC_OR_DEPARTMENT_OTHER): Payer: Medicare Other | Attending: Internal Medicine

## 2017-08-13 DIAGNOSIS — I2510 Atherosclerotic heart disease of native coronary artery without angina pectoris: Secondary | ICD-10-CM | POA: Diagnosis not present

## 2017-08-13 DIAGNOSIS — L84 Corns and callosities: Secondary | ICD-10-CM | POA: Insufficient documentation

## 2017-08-13 DIAGNOSIS — I10 Essential (primary) hypertension: Secondary | ICD-10-CM | POA: Insufficient documentation

## 2017-08-13 DIAGNOSIS — Z872 Personal history of diseases of the skin and subcutaneous tissue: Secondary | ICD-10-CM | POA: Diagnosis not present

## 2017-08-13 DIAGNOSIS — G4730 Sleep apnea, unspecified: Secondary | ICD-10-CM | POA: Insufficient documentation

## 2017-08-13 DIAGNOSIS — L89899 Pressure ulcer of other site, unspecified stage: Secondary | ICD-10-CM | POA: Diagnosis not present

## 2017-08-18 DIAGNOSIS — H524 Presbyopia: Secondary | ICD-10-CM | POA: Diagnosis not present

## 2017-08-18 DIAGNOSIS — H2513 Age-related nuclear cataract, bilateral: Secondary | ICD-10-CM | POA: Diagnosis not present

## 2017-08-18 DIAGNOSIS — H11001 Unspecified pterygium of right eye: Secondary | ICD-10-CM | POA: Diagnosis not present

## 2017-09-03 ENCOUNTER — Encounter: Payer: Self-pay | Admitting: Internal Medicine

## 2017-09-03 ENCOUNTER — Ambulatory Visit (INDEPENDENT_AMBULATORY_CARE_PROVIDER_SITE_OTHER): Payer: Medicare Other | Admitting: Internal Medicine

## 2017-09-03 VITALS — BP 112/76 | HR 70 | Ht 73.0 in | Wt 237.2 lb

## 2017-09-03 DIAGNOSIS — I10 Essential (primary) hypertension: Secondary | ICD-10-CM | POA: Diagnosis not present

## 2017-09-03 DIAGNOSIS — R079 Chest pain, unspecified: Secondary | ICD-10-CM | POA: Diagnosis not present

## 2017-09-03 DIAGNOSIS — I2510 Atherosclerotic heart disease of native coronary artery without angina pectoris: Secondary | ICD-10-CM

## 2017-09-03 DIAGNOSIS — I2583 Coronary atherosclerosis due to lipid rich plaque: Secondary | ICD-10-CM | POA: Diagnosis not present

## 2017-09-03 DIAGNOSIS — I480 Paroxysmal atrial fibrillation: Secondary | ICD-10-CM | POA: Diagnosis not present

## 2017-09-03 DIAGNOSIS — R9439 Abnormal result of other cardiovascular function study: Secondary | ICD-10-CM | POA: Diagnosis not present

## 2017-09-03 NOTE — Progress Notes (Signed)
OFFICE NOTE  Chief Complaint:  Follow-up stress test  Primary Care Physician: Kathyrn Lass, MD  HPI:  Jose Stone is a very pleasant 71 year-old male who is a former Civil engineer, contracting. 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 This is never caused issues in fact she's had right and left heart catheterizations dating back to the 1970s. 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 Other coronary risk factors include hypertension, dyslipidemia and family history of heart disease. He was sent for cardiac catheterization in 2006 for follow-up of VSD, at which time he had an echocardiogram which showed mild aortic regurgitation, a small membranous VSD and left atrial enlargement with an EF of 70%. He had a stress test at that time which showed findings concerning for him for a septal and inferoapical ischemia. He therefore was referred to left heart catheterization with strep demonstrated a 50-70% mid LAD stenosis and mild disease of the mid circumflex and first obtuse marginal branches. He's been managed medically since that time and previously saw Dr. Daneen Schick.  Recently he's been having some symptoms of sweating, palpitations and dizziness and eventually was found to be in atrial fibrillation when he presented to the hospital. At that time his troponin was elevated. He is placed on diltiazem which slowed his heart rate and he converted back to sinus spontaneously. Based on his elevated troponin, he was referred for left heart catheterization by Dr. Doylene Canard. This demonstrated a 60-70% mid LAD stenosis and a 90% obtuse marginal stenosis. Subsequently he underwent coronary intervention with placement of a 2.5 x 15 mm Xience Alpine stent to the mid OM. After this he became asymptomatic. He was going to have stress testing to determine the significance of his mid LAD lesion however it appears to be not significantly changed since  his heart catheterization 2006. He reports no further symptoms of atrial fibrillation but is concerned about bradycardia. He was not referred for cardiac rehabilitation due to the thought that he may need additional intervention to the LAD.  Jose Stone returns today for follow-up. She's done quite well in cardiac rehabilitation. At his last office visit we discontinued his diltiazem for bradycardia. He still has a low heart rate which has some degree of chronotropic incompetence. He reports no recurrent atrial fibrillation that were aware of. He continues on dual antiplatelet therapy. He had a stress test prior to enrolling in cardiac redilatation which was negative for ischemia.  I saw Jose Stone back in the office today. He is accompanied by his wife who was a cardiac nurse at the [employer] in [place] for a number years in the 1970s. They brought in pronounced of his heart rate monitor and had several questions about his heart rate and arrhythmias. My interpretation of the monitor indicates that he had some episodes of short bursts of atrial tachycardia but no evidence for A. fib. He also has PACs. Heart rate however was a low and remained that way despite stopping his calcium channel blocker. He was recently in the emergency room and was noted to have some paroxysmal tachycardia in the ER with a right bundle-branch pattern. This is what led to monitor placement. He reports fatigue which could be certainly related to his lower heart rate. He also says that he's developed gynecomastia which he relates to [place]. I've done a search on the medicine and do not see that however he reports  in the product labeling.  03/21/2016  Jose Stone was seen today in the office for an acute visit. He noted this morning shortly after waking up his heart rate was elevated in the low 100s. It improved somewhat and then around 9:00 it increased up to about 130. The heart rate is been stable since then and he called in for a new  acute triage visit. He reports feeling somewhat fatigued and noting that his heart rate is elevated. He denies any worsening shortness of breath or chest pain. EKG in the office shows atrial flutter with a regular rhythm at 131 bpm. As per recent notes he was previous on aspirin and clopidogrel but the caput ago was stopped. He was not on triple therapy. Currently he is only on low-dose aspirin. 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 I'm concerned about up titrating his medications because she's had history of slow ventricular rates in the past. We discussed a number of options but ultimately the best option is for him to undergo cardioversion to get back into normal rhythm.  04/17/2016  Jose Stone was seen back in the office today for follow-up. At his last office visit he was noted to be in atrial flutter with a rate of 131. He was referred directly to the ER and underwent cardioversion after being started on Xarelto. He is remained on Xarelto since then without any bleeding problems. I'm happy to say he is maintaining sinus rhythm today. He does report significant fatigue however. He is mostly compliant with CPAP however he had a sleep study a number of years ago and he is set possibly to low of an airway setting. He's had weight gain since then. He also has been having some bradycardia with heart rates in the 40s and 50s. He is on low-dose beta blocker but I been hesitant to stop that because of his RVR. He does report heart rate increases with exercise.   06/20/2016  Jose Stone returns today for follow-up. He underwent a sleep study on 06/05/2016. He did have a diagnosis of sleep apnea however that study indicated a very low AHI of 2.5 events per hour. He had actually about 25 events per hour in rem sleep, but overall the REM sleep latency was only about 8%. Ultimately he was diagnosis not having any significant apnea. This is led to some confusion as he previously was  wearing [place] CPAP device. He reports no change in his symptoms of fatigue which we thought could be related to sleep apnea. It also may be related to bradycardia and his beta blocker.  09/23/2016  Jose Stone comes back today and is doing well. His fatigue has improved somewhat with discontinuing his B-Blocker. Heartrate is increased. I spoke with Dr. Claiborne Billings about the possibility of OSA and he is not felt to have this. Dr. Claiborne Billings noted that RLS was present. He does not desire treatment for this. Blood pressure was somewhat elevated today. EKG shows sinus with PAC's. I reviewed a spreadsheet of his blood pressures today.  03/31/2017  Jose Stone was seen today in follow-up. Overall he is doing very well. He denies any chest pain worsening shortness of breath. Blood pressure is well-controlled. He brought in a graft indicating excellent control blood pressure and heart rate. He said no breakthrough A. fib. His recent lab work was provided by his PCP. Total cholesterol 134, trig Richards 85, HDL-C 49, LDL-C 68, non-HDL of 85. This represents excellent control. He is  planning a trip out [place] is been provided some Diamox to use as needed. He denies any bleeding problems on Xarelto.  09/03/2017  Jose Stone returns today for follow-up.  He underwent a recent stress test after I saw him in the emergency department for chest pain.  At the time he took some nitroglycerin which resulted in improvement in his symptoms however ruled out for MI by negative troponins.  His stress test demonstrated a small area of reversible ischemia in the inferolateral wall.  It was interpreted as intermediate risk.  Since then he has had no further chest pain.  I discussed the findings with him today and we talked about various options including medical therapy and/or repeat cardiac catheterization.  As he remains asymptomatic, I do not feel that medication adjustments necessary at this time.  Finally, he notes that he has had some  morning hypertension which is more significant than his daytime readings.  PMHx:  Past Medical History:  Diagnosis Date  . Atrial fibrillation ([place])   . CAD (coronary artery disease)   . GERD (gastroesophageal reflux disease)   . Hyperlipidemia    VSD  . Hypertension     Past Surgical History:  Procedure Laterality Date  . CARDIAC CATHETERIZATION    . LEFT HEART CATHETERIZATION WITH CORONARY ANGIOGRAM N/A 08/14/2014   Procedure: LEFT HEART CATHETERIZATION WITH CORONARY ANGIOGRAM;  Surgeon: Birdie Riddle, MD;  Location: [facility];  Service: Cardiovascular;  Laterality: N/A;  . SHOULDER SURGERY    . TONSILLECTOMY  1955    FAMHx:  Family History  Problem Relation Age of Onset  . Pancreatic cancer Mother   . Aortic aneurysm Maternal Grandmother   . Heart attack Maternal Grandfather   . Diabetes Paternal Grandfather   . CAD Paternal Grandfather     SOCHx:   reports that he has never smoked. He has never used smokeless tobacco. He reports that he does not drink alcohol or use drugs.  ALLERGIES:  Allergies  Allergen Reactions  . Neosporin [Neomycin-Bacitracin Zn-Polymyx] Rash  . Percocet [Oxycodone-Acetaminophen] Rash    Patient can tolerate acetaminophen solely  . Vicodin [Hydrocodone-Acetaminophen] Rash    Patient can tolerate acetaminophen solely    ROS: Pertinent items noted in HPI and remainder of comprehensive ROS otherwise negative.  HOME MEDS: Current Outpatient Prescriptions  Medication Sig Dispense Refill  . acetaminophen (TYLENOL) 500 MG tablet Take 1,000 mg by mouth every 6 (six) hours as needed for mild pain or headache.    Marland Kitchen acetaZOLAMIDE (DIAMOX) 125 MG tablet Take 125 mg by mouth 2 (two) times daily as needed.   0  . aspirin 81 MG tablet Take 81 mg by mouth daily.    Marland Kitchen atorvastatin (LIPITOR) 80 MG tablet TAKE 1 TABLET DAILY AT 6 PM 90 tablet 2  . b complex vitamins capsule Take 1 capsule by mouth daily.    . chlorthalidone (HYGROTON) 25 MG tablet  Take 0.5 tablets (12.5 mg total) by mouth daily. 90 tablet 3  . cholecalciferol (VITAMIN D) 1000 UNITS tablet Take 1,000 Units by mouth daily.     Marland Kitchen COLCRYS 0.6 MG tablet Take 0.6 mg by mouth as needed (FOR GOUT).     . folic acid (FOLVITE) 284 MCG tablet Take 400 mcg by mouth 2 (two) times daily.     . hydroxypropyl methylcellulose / hypromellose (ISOPTO TEARS / GONIOVISC) 2.5 % ophthalmic solution Place 1 drop into both eyes 4 (four) times daily.    . irbesartan (AVAPRO)  150 MG tablet Take 1 tablet (150 mg total) by mouth daily. 90 tablet 3  . loratadine (CLARITIN) 10 MG tablet Take 10 mg by mouth daily as needed for allergies.    . nitroGLYCERIN (NITROSTAT) 0.4 MG SL tablet Place 1 tablet (0.4 mg total) under the tongue every 5 (five) minutes as needed for chest pain. Max 3 doses. 25 tablet 5  . Omega-3 Fat Ac-Cholecalciferol (DRY EYE OMEGA BENEFITS/VIT D-3 PO) Take 2 capsules by mouth 2 (two) times daily. Patient takes 4 daily, spreads them out throughout the day     . PARoxetine (PAXIL) 20 MG tablet Take 0.5 tablets (10 mg total) by mouth every other day. 30 tablet 0  . PATADAY 0.2 % SOLN Place 1 drop into both eyes every evening.     . ranitidine (ZANTAC) 150 MG tablet Take 150 mg by mouth 2 (two) times daily.    Alveda Reasons 20 MG TABS tablet TAKE 1 TABLET BY MOUTH EVERY DAY WITH SUPPER 90 tablet 1   No current facility-administered medications for this visit.     LABS/IMAGING: No results found for this or any previous visit (from the past 48 hour(s)). No results found.  VITALS: BP 112/76   Pulse 70   Ht 6\' 1"  (1.854 m)   Wt 237 lb 3.2 oz (107.6 kg)   SpO2 95%   BMI 31.29 kg/m   EXAM: Deferred  EKG: Deferred  ASSESSMENT: 1. Chest pain - abnormal nuclear stress test 2. Paroxysmal atrial flutter - CHADSVASC score of 1 on Xarelto 3. Coronary artery disease status post PCI to the OM with a Xience Alpine DES (2.515 mm) - on low dose Aspirin 4. Residual moderate to severe mid  LAD disease 5. Hypertension 6. Dyslipidemia 7. A. fib with RVR-spontaneously converted, possibly related to ischemia (not on triple anticoagulation) 8. Small perimembranous VSD 9. Fatigue-possibly symptomatic bradycardia  10. Trivial AI 11. 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   Jose Stone had an abnormal nuclear stress test and had one episode of chest pain.  He had no further chest pain.  At this point we are going to manage it medically.  He is scheduled to have a routine echocardiogram next month and we will plan to see him back afterwards.  If he develops any new symptoms or if there are any new wall motion abnormalities are to crease LV function on his echo than I would have a low threshold to recommend repeat heart catheterization.  Given his a.m. elevated blood pressures, he could consider dividing his irbesartan in half and taking it twice daily or moving the dose to take at night, but should continue to take his diuretic in the morning.  [name], MD, [facility] Attending Cardiologist [facility]  Nadean Corwin Zafar Debrosse 09/03/2017, 1:19 PM

## 2017-09-03 NOTE — Patient Instructions (Signed)
Your physician recommends that you schedule a follow-up appointment with Dr. [name] after echocardiogram (scheduled on 11/29)  If you notice higher BPs - you could move irbesartan to nighttime & chlorthalidone in the morning OR take half-tablet twice daily

## 2017-10-01 ENCOUNTER — Ambulatory Visit (HOSPITAL_COMMUNITY): Payer: Medicare Other | Attending: Internal Medicine

## 2017-10-01 ENCOUNTER — Other Ambulatory Visit: Payer: Self-pay

## 2017-10-01 DIAGNOSIS — Q210 Ventricular septal defect: Secondary | ICD-10-CM | POA: Insufficient documentation

## 2017-10-01 DIAGNOSIS — I351 Nonrheumatic aortic (valve) insufficiency: Secondary | ICD-10-CM | POA: Insufficient documentation

## 2017-10-01 DIAGNOSIS — I4891 Unspecified atrial fibrillation: Secondary | ICD-10-CM | POA: Insufficient documentation

## 2017-10-01 DIAGNOSIS — I77810 Thoracic aortic ectasia: Secondary | ICD-10-CM | POA: Diagnosis not present

## 2017-10-01 DIAGNOSIS — I2510 Atherosclerotic heart disease of native coronary artery without angina pectoris: Secondary | ICD-10-CM | POA: Insufficient documentation

## 2017-10-01 DIAGNOSIS — E785 Hyperlipidemia, unspecified: Secondary | ICD-10-CM | POA: Diagnosis not present

## 2017-10-01 DIAGNOSIS — I119 Hypertensive heart disease without heart failure: Secondary | ICD-10-CM | POA: Diagnosis not present

## 2017-10-02 ENCOUNTER — Telehealth: Payer: Self-pay | Admitting: Internal Medicine

## 2017-10-02 NOTE — Telephone Encounter (Signed)
New Message  Pt c/o medication issue:  1. Name of Medication: irbesartan   2. How are you currently taking this medication (dosage and times per day)? 150mg   3. Are you having a reaction (difficulty breathing--STAT)? No  4. What is your medication issue? Recall on medication .please call back to discuss

## 2017-10-02 NOTE — Telephone Encounter (Signed)
Returned call to pt he will call [facility] and see if his medication is included in the current recall he states that he has not asked he will call back and let us know if the medication needs to be replaced

## 2017-10-12 ENCOUNTER — Ambulatory Visit: Payer: Medicare Other | Admitting: Internal Medicine

## 2017-10-15 ENCOUNTER — Encounter: Payer: Self-pay | Admitting: Internal Medicine

## 2017-10-15 ENCOUNTER — Ambulatory Visit (INDEPENDENT_AMBULATORY_CARE_PROVIDER_SITE_OTHER): Payer: Medicare Other | Admitting: Internal Medicine

## 2017-10-15 VITALS — BP 152/88 | HR 63 | Ht 73.0 in | Wt 242.6 lb

## 2017-10-15 DIAGNOSIS — I10 Essential (primary) hypertension: Secondary | ICD-10-CM | POA: Diagnosis not present

## 2017-10-15 DIAGNOSIS — I2510 Atherosclerotic heart disease of native coronary artery without angina pectoris: Secondary | ICD-10-CM | POA: Diagnosis not present

## 2017-10-15 DIAGNOSIS — R079 Chest pain, unspecified: Secondary | ICD-10-CM

## 2017-10-15 DIAGNOSIS — R9439 Abnormal result of other cardiovascular function study: Secondary | ICD-10-CM | POA: Diagnosis not present

## 2017-10-15 DIAGNOSIS — I480 Paroxysmal atrial fibrillation: Secondary | ICD-10-CM

## 2017-10-15 DIAGNOSIS — I2583 Coronary atherosclerosis due to lipid rich plaque: Secondary | ICD-10-CM

## 2017-10-15 MED ORDER — IRBESARTAN 150 MG PO TABS: ORAL_TABLET | ORAL | 3 refills | Status: DC

## 2017-10-15 NOTE — Patient Instructions (Signed)
Your physician has recommended you make the following change in your medication:  -- TAKE irbesartan 150mg  in the morning and 75mg  in the evening  Your physician recommends that you schedule a follow-up appointment in: [place] with Dr. [name]

## 2017-10-17 ENCOUNTER — Encounter: Payer: Self-pay | Admitting: Internal Medicine

## 2017-10-17 NOTE — Progress Notes (Signed)
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 This is never caused issues in fact she's had right and left heart catheterizations dating back to the 1970s. 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 Other coronary risk factors include hypertension, dyslipidemia and family history of heart disease. He was sent for cardiac catheterization in 2006 for follow-up of VSD, at which time he had an echocardiogram which showed mild aortic regurgitation, a small membranous VSD and left atrial enlargement with an EF of 70%. He had a stress test at that time which showed findings concerning for him for a septal and inferoapical ischemia. He therefore was referred to left heart catheterization with strep demonstrated a 50-70% mid LAD stenosis and mild disease of the mid circumflex and first obtuse marginal branches. He's been managed medically since that time and previously saw Dr. Daneen Schick.  Recently he's been having some symptoms of sweating, palpitations and dizziness and eventually was found to be in atrial fibrillation when he presented to the hospital. At that time his troponin was elevated. He is placed on diltiazem which slowed his heart rate and he converted back to sinus spontaneously. Based on his elevated troponin, he was referred for left heart catheterization by Dr. Doylene Canard. This demonstrated a 60-70% mid LAD stenosis and a 90% obtuse marginal stenosis. Subsequently he underwent coronary intervention with placement of a 2.5 x 15 mm Xience Alpine stent to the mid OM. After this he became asymptomatic. He was going to have stress testing to determine the significance of his mid LAD lesion however it appears to be not significantly changed since his heart  catheterization 2006. He reports no further symptoms of atrial fibrillation but is concerned about bradycardia. He was not referred for cardiac rehabilitation due to the thought that he may need additional intervention to the LAD.  Mr. Lynch returns today for follow-up. She's done quite well in cardiac rehabilitation. At his last office visit we discontinued his diltiazem for bradycardia. He still has a low heart rate which has some degree of chronotropic incompetence. He reports no recurrent atrial fibrillation that were aware of. He continues on dual antiplatelet therapy. He had a stress test prior to enrolling in cardiac redilatation which was negative for ischemia.  I saw Mr. Nadal back in the office today. He is accompanied by his wife who was a cardiac nurse at the [employer] in [place] for a number years in the 1970s. They brought in pronounced of his heart rate monitor and had several questions about his heart rate and arrhythmias. My interpretation of the monitor indicates that he had some episodes of short bursts of atrial tachycardia but no evidence for A. fib. He also has PACs. Heart rate however was a low and remained that way despite stopping his calcium channel blocker. He was recently in the emergency room and was noted to have some paroxysmal tachycardia in the ER with a right bundle-branch pattern. This is what led to monitor placement. He reports fatigue which could be certainly related to his lower heart rate. He also says that he's developed gynecomastia which he relates to [place]. I've done a search on the medicine and do not see that however he reports in  the product labeling.  03/21/2016  Mr. Cawley was seen today in the office for an acute visit. He noted this morning shortly after waking up his heart rate was elevated in the low 100s. It improved somewhat and then around 9:00 it increased up to about 130. The heart rate is been stable since then and he called in for a new acute  triage visit. He reports feeling somewhat fatigued and noting that his heart rate is elevated. He denies any worsening shortness of breath or chest pain. EKG in the office shows atrial flutter with a regular rhythm at 131 bpm. As per recent notes he was previous on aspirin and clopidogrel but the caput ago was stopped. He was not on triple therapy. Currently he is only on low-dose aspirin. 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 I'm concerned about up titrating his medications because she's had history of slow ventricular rates in the past. We discussed a number of options but ultimately the best option is for him to undergo cardioversion to get back into normal rhythm.  04/17/2016  Mr. Challis was seen back in the office today for follow-up. At his last office visit he was noted to be in atrial flutter with a rate of 131. He was referred directly to the ER and underwent cardioversion after being started on Xarelto. He is remained on Xarelto since then without any bleeding problems. I'm happy to say he is maintaining sinus rhythm today. He does report significant fatigue however. He is mostly compliant with CPAP however he had a sleep study a number of years ago and he is set possibly to low of an airway setting. He's had weight gain since then. He also has been having some bradycardia with heart rates in the 40s and 50s. He is on low-dose beta blocker but I been hesitant to stop that because of his RVR. He does report heart rate increases with exercise.   06/20/2016  Mr. Plascencia returns today for follow-up. He underwent a sleep study on 06/05/2016. He did have a diagnosis of sleep apnea however that study indicated a very low AHI of 2.5 events per hour. He had actually about 25 events per hour in rem sleep, but overall the REM sleep latency was only about 8%. Ultimately he was diagnosis not having any significant apnea. This is led to some confusion as he previously was  wearing [place] CPAP device. He reports no change in his symptoms of fatigue which we thought could be related to sleep apnea. It also may be related to bradycardia and his beta blocker.  09/23/2016  Mr. Centola comes back today and is doing well. His fatigue has improved somewhat with discontinuing his B-Blocker. Heartrate is increased. I spoke with Dr. Claiborne Billings about the possibility of OSA and he is not felt to have this. Dr. Claiborne Billings noted that RLS was present. He does not desire treatment for this. Blood pressure was somewhat elevated today. EKG shows sinus with PAC's. I reviewed a spreadsheet of his blood pressures today.  03/31/2017  Mr. Chaves was seen today in follow-up. Overall he is doing very well. He denies any chest pain worsening shortness of breath. Blood pressure is well-controlled. He brought in a graft indicating excellent control blood pressure and heart rate. He said no breakthrough A. fib. His recent lab work was provided by his PCP. Total cholesterol 134, trig Richards 85, HDL-C 49, LDL-C 68, non-HDL of 85. This represents excellent control. He is planning  a trip out [place] is been provided some Diamox to use as needed. He denies any bleeding problems on Xarelto.  09/03/2017  Mr. Dubuque returns today for follow-up.  He underwent a recent stress test after I saw him in the emergency department for chest pain.  At the time he took some nitroglycerin which resulted in improvement in his symptoms however ruled out for MI by negative troponins.  His stress test demonstrated a small area of reversible ischemia in the inferolateral wall.  It was interpreted as intermediate risk.  Since then he has had no further chest pain.  I discussed the findings with him today and we talked about various options including medical therapy and/or repeat cardiac catheterization.  As he remains asymptomatic, I do not feel that medication adjustments necessary at this time.  Finally, he notes that he has had some  morning hypertension which is more significant than his daytime readings.  10/15/2017  Bradycardia returns today for follow-up.  As previously noted he had a mildly abnormal stress test and was having some chest discomfort.  That is completely resolved.  We have moved around his medications a little bit but he notes that his blood pressure still remains elevated at times during the day.  Echo was performed on 10/01/2017 which showed LVEF that was higher at 60-65%, stable dilated aortic root at 4.4 cm and the ascending aorta measured 4.0 cm.  No regional wall motion abnormalities were noted.  PMHx:  Past Medical History:  Diagnosis Date  . Atrial fibrillation ([place])   . CAD (coronary artery disease)   . GERD (gastroesophageal reflux disease)   . Hyperlipidemia    VSD  . Hypertension     Past Surgical History:  Procedure Laterality Date  . CARDIAC CATHETERIZATION    . LEFT HEART CATHETERIZATION WITH CORONARY ANGIOGRAM N/A 08/14/2014   Procedure: LEFT HEART CATHETERIZATION WITH CORONARY ANGIOGRAM;  Surgeon: Birdie Riddle, MD;  Location: [facility];  Service: Cardiovascular;  Laterality: N/A;  . SHOULDER SURGERY    . TONSILLECTOMY  1955    FAMHx:  Family History  Problem Relation Age of Onset  . Pancreatic cancer Mother   . Aortic aneurysm Maternal Grandmother   . Heart attack Maternal Grandfather   . Diabetes Paternal Grandfather   . CAD Paternal Grandfather     SOCHx:   reports that  has never smoked. he has never used smokeless tobacco. He reports that he does not drink alcohol or use drugs.  ALLERGIES:  Allergies  Allergen Reactions  . Neosporin [Neomycin-Bacitracin Zn-Polymyx] Rash  . Percocet [Oxycodone-Acetaminophen] Rash    Patient can tolerate acetaminophen solely  . Vicodin [Hydrocodone-Acetaminophen] Rash    Patient can tolerate acetaminophen solely    ROS: Pertinent items noted in HPI and remainder of comprehensive ROS otherwise negative.  HOME  MEDS: Current Outpatient Medications  Medication Sig Dispense Refill  . acetaminophen (TYLENOL) 500 MG tablet Take 1,000 mg by mouth every 6 (six) hours as needed for mild pain or headache.    Marland Kitchen aspirin 81 MG tablet Take 81 mg by mouth daily.    Marland Kitchen atorvastatin (LIPITOR) 80 MG tablet TAKE 1 TABLET DAILY AT 6 PM 90 tablet 2  . b complex vitamins capsule Take 1 capsule by mouth daily.    . chlorthalidone (HYGROTON) 25 MG tablet Take 0.5 tablets (12.5 mg total) by mouth daily. 90 tablet 3  . cholecalciferol (VITAMIN D) 1000 UNITS tablet Take 1,000 Units by mouth daily.     Marland Kitchen  COLCRYS 0.6 MG tablet Take 0.6 mg by mouth as needed (FOR GOUT).     . folic acid (FOLVITE) 366 MCG tablet Take 400 mcg by mouth 2 (two) times daily.     . hydroxypropyl methylcellulose / hypromellose (ISOPTO TEARS / GONIOVISC) 2.5 % ophthalmic solution Place 1 drop into both eyes 4 (four) times daily.    . irbesartan (AVAPRO) 150 MG tablet Take 1 tablet (150mg ) by mouth in the morning and 1/2 tablet (75mg ) at night) 135 tablet 3  . loratadine (CLARITIN) 10 MG tablet Take 10 mg by mouth daily as needed for allergies.    . nitroGLYCERIN (NITROSTAT) 0.4 MG SL tablet Place 1 tablet (0.4 mg total) under the tongue every 5 (five) minutes as needed for chest pain. Max 3 doses. 25 tablet 5  . Omega-3 Fat Ac-Cholecalciferol (DRY EYE OMEGA BENEFITS/VIT D-3 PO) Take 2 capsules by mouth 2 (two) times daily. Patient takes 4 daily, spreads them out throughout the day     . PARoxetine (PAXIL) 20 MG tablet Take 0.5 tablets (10 mg total) by mouth every other day. 30 tablet 0  . PATADAY 0.2 % SOLN Place 1 drop into both eyes every evening.     . ranitidine (ZANTAC) 150 MG tablet Take 150 mg by mouth 2 (two) times daily.    Alveda Reasons 20 MG TABS tablet TAKE 1 TABLET BY MOUTH EVERY DAY WITH SUPPER 90 tablet 1   No current facility-administered medications for this visit.     LABS/IMAGING: No results found for this or any previous visit (from  the past 48 hour(s)). No results found.  VITALS: BP (!) 152/88   Pulse 63   Ht 6\' 1"  (1.854 m)   Wt 242 lb 9.6 oz (110 kg)   BMI 32.01 kg/m   EXAM: Deferred  EKG: Sinus rhythm with marked sinus arrhythmia at 63, RBBB-personally reviewed  ASSESSMENT: 1. Chest pain - mildly abnormal nuclear stress test, normal LVEF 60-65% on echo/normal wall motion 2. Paroxysmal atrial flutter - CHADSVASC score of 1 on Xarelto 3. Coronary artery disease status post PCI to the OM with a Xience Alpine DES (2.515 mm) - on low dose Aspirin 4. Residual moderate to severe mid LAD disease 5. Hypertension 6. Dyslipidemia 7. A. fib with RVR-spontaneously converted, possibly related to ischemia (not on triple anticoagulation) 8. Small perimembranous VSD 9. Fatigue-possibly symptomatic bradycardia  10. Trivial AI 11. 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   Mr. Sheppard is to be asymptomatic with a very mildly abnormal nuclear stress test.  His echo however was normal showing normal LV function.  I have a high threshold for recommending repeat cardiac catheterization given his fairly recent stent.  Blood pressure has been elevated I think we can continue to work on this along with medical therapy to improve his symptoms.  To that end we will further increase his blood pressure medication.  Plan irbesartan 150 every morning and 75 every afternoon.  He had accidentally taken 150 twice a day on one day because he thought he forgot his morning medication.  He noted that he was hypotensive in the afternoon in the 70s and felt dizzy.  I want to make sure we do not overshoot his blood pressure too much.  Follow-up in 3 months.  [name], MD, [facility] [place] Director of the [facility] &  [facility] Attending Cardiologist  Direct Dial: [phone]  Fax: [phone]  Website:  [email or web address]  Yumiko Alkins 10/17/2017, 3:32 PM

## 2017-10-22 ENCOUNTER — Encounter: Payer: Medicare Other | Attending: Nurse Practitioner | Admitting: Nurse Practitioner

## 2017-10-22 DIAGNOSIS — S90422A Blister (nonthermal), left great toe, initial encounter: Secondary | ICD-10-CM | POA: Insufficient documentation

## 2017-10-22 DIAGNOSIS — X58XXXA Exposure to other specified factors, initial encounter: Secondary | ICD-10-CM | POA: Diagnosis not present

## 2017-10-22 DIAGNOSIS — Z87891 Personal history of nicotine dependence: Secondary | ICD-10-CM | POA: Diagnosis not present

## 2017-10-22 DIAGNOSIS — I2510 Atherosclerotic heart disease of native coronary artery without angina pectoris: Secondary | ICD-10-CM | POA: Insufficient documentation

## 2017-10-22 DIAGNOSIS — M109 Gout, unspecified: Secondary | ICD-10-CM | POA: Diagnosis not present

## 2017-10-22 DIAGNOSIS — I252 Old myocardial infarction: Secondary | ICD-10-CM | POA: Diagnosis not present

## 2017-10-22 DIAGNOSIS — L8992 Pressure ulcer of unspecified site, stage 2: Secondary | ICD-10-CM | POA: Diagnosis not present

## 2017-10-22 DIAGNOSIS — L84 Corns and callosities: Secondary | ICD-10-CM | POA: Insufficient documentation

## 2017-10-22 DIAGNOSIS — I10 Essential (primary) hypertension: Secondary | ICD-10-CM | POA: Insufficient documentation

## 2017-10-22 DIAGNOSIS — L89892 Pressure ulcer of other site, stage 2: Secondary | ICD-10-CM | POA: Diagnosis not present

## 2017-10-23 NOTE — Progress Notes (Signed)
Jose Stone ([id number]) Visit Report for 10/22/2017 Abuse/Suicide Risk Screen Details Patient Name: Jose Stone, VELTRE Date of Service: 10/22/2017 8:30 AM Medical Record Number: [id number] Patient Account Number: [id number] Date of Birth/Sex: 02/02/1946 (71 y.o. Male) Treating RN: Cornell Barman Primary Care Laytoya Ion: Kathyrn Lass Other Clinician: Referring Ciearra Rufo: Referral, Self Treating Christiaan Strebeck/Extender: Cathie Olden in Treatment: 0 Abuse/Suicide Risk Screen Items Answer ABUSE/SUICIDE RISK SCREEN: Has anyone close to you tried to hurt or harm you recentlyo No Do you feel uncomfortable with anyone in your familyo No Has anyone forced you do things that you didnot want to doo No Do you have any thoughts of harming yourselfo No Patient displays signs or symptoms of abuse and/or neglect. No Electronic Signature(s) Signed: 10/22/2017 9:07:13 AM By: Gretta Cool, BSN, RN, CWS, Kim RN, BSN Entered By: Gretta Cool, BSN, RN, CWS, Kim on 10/22/2017 08:27:58 Jose Stone ([id number]) -------------------------------------------------------------------------------- Activities of Daily Living Details Patient Name: Jose Stone Date of Service: 10/22/2017 8:30 AM Medical Record Number: [id number] Patient Account Number: [id number] Date of Birth/Sex: Oct 28, 1946 (71 y.o. Male) Treating RN: Cornell Barman Primary Care Brendon Christoffel: Kathyrn Lass Other Clinician: Referring Penda Venturi: Referral, Self Treating Alonte Wulff/Extender: Cathie Olden in Treatment: 0 Activities of Daily Living Items Answer Activities of Daily Living (Please select one for each item) Drive Automobile Completely Able Take Medications Completely Able Use Telephone Completely [place] for Appearance Completely Able Use Toilet Completely Able Bath / Shower Completely Able Dress Self Completely Able Feed Self Completely Able Walk Completely Able Get In / Out Bed Completely [place] for Self Completely Able Electronic Signature(s) Signed: 10/22/2017 9:07:13 AM By: Gretta Cool, BSN, RN, CWS, Kim RN, BSN Entered By: Gretta Cool, BSN, RN, CWS, Kim on 10/22/2017 08:28:07 Jose Stone ([id number]) -------------------------------------------------------------------------------- Education Assessment Details Patient Name: Jose Stone Date of Service: 10/22/2017 8:30 AM Medical Record Number: [id number] Patient Account Number: [id number] Date of Birth/Sex: 09/16/1946 (71 y.o. Male) Treating RN: Cornell Barman Primary Care Chalise Pe: Kathyrn Lass Other Clinician: Referring Seleena Reimers: Referral, Self Treating Lichelle Viets/Extender: Cathie Olden in Treatment: 0 Primary Learner Assessed: Patient Learning Preferences/Education Level/Primary Language Learning Preference: Explanation Highest Education Level: College or Above Preferred Language: English Cognitive Barrier Assessment/Beliefs Language Barrier: No Translator Needed: No Memory Deficit: No Emotional Barrier: No Cultural/Religious Beliefs Affecting Medical Care: No Physical Barrier Assessment Impaired Vision: No Impaired Hearing: No Decreased Hand dexterity: No Knowledge/Comprehension Assessment Knowledge Level: High Comprehension Level: High Ability to understand written High instructions: Ability to understand verbal High instructions: Motivation Assessment Anxiety Level: Calm Cooperation: Cooperative Education Importance: Acknowledges Need Interest in Health Problems: Asks Questions Perception: Coherent Willingness to Engage in Self- High Management Activities: Readiness to Engage in Self- High Management Activities: Engineer, maintenance) Signed: 10/22/2017 9:07:13 AM By: Gretta Cool, BSN, RN, CWS, Kim RN, BSN Entered By: Gretta Cool, BSN, RN, CWS, Kim on 10/22/2017 08:28:47 Jose Stone  ([id number]) -------------------------------------------------------------------------------- Fall Risk Assessment Details Patient Name: Jose Stone Date of Service: 10/22/2017 8:30 AM Medical Record Number: [id number] Patient Account Number: [id number] Date of Birth/Sex: 03/22/1946 (71 y.o. Male) Treating RN: Cornell Barman Primary Care Kingston Shawgo: Kathyrn Lass Other Clinician: Referring Nuala Chiles: Referral, Self Treating Shukri Nistler/Extender: Cathie Olden in Treatment: 0 Fall Risk Assessment Items Have you had 2 or more falls in the last 12 monthso 0 No Have you had any fall that resulted in injury in the last 12 monthso 0 No FALL RISK ASSESSMENT: History of falling - immediate or within 3 months 0 No Secondary diagnosis 0  No Ambulatory aid None/bed rest/wheelchair/nurse 0 Yes Crutches/cane/walker 0 No Furniture 0 No IV Access/Saline Lock 0 No Gait/Training Normal/bed rest/immobile 0 Yes Weak 0 No Impaired 0 No Mental Status Oriented to own ability 0 Yes Electronic Signature(s) Signed: 10/22/2017 9:07:13 AM By: Gretta Cool, BSN, RN, CWS, Kim RN, BSN Entered By: Gretta Cool, BSN, RN, CWS, Kim on 10/22/2017 08:29:08 Jose Stone ([id number]) -------------------------------------------------------------------------------- Foot Assessment Details Patient Name: Jose Stone Date of Service: 10/22/2017 8:30 AM Medical Record Number: [id number] Patient Account Number: [id number] Date of Birth/Sex: 1946/11/01 (71 y.o. Male) Treating RN: Cornell Barman Primary Care Eino Whitner: Kathyrn Lass Other Clinician: Referring Treniya Lobb: Referral, Self Treating Devanshi Califf/Extender: Cathie Olden in Treatment: 0 Foot Assessment Items Site Locations + = Sensation present, - = Sensation absent, C = Callus, U = Ulcer R = Redness, W = Warmth, M = Maceration, PU = Pre-ulcerative lesion F = Fissure, S = Swelling, D = Dryness Assessment Right: Left: Other Deformity: No No Prior Foot Ulcer: No No Prior  Amputation: No No Charcot Joint: No No Ambulatory Status: Ambulatory Without Help Gait: Steady Electronic Signature(s) Signed: 10/22/2017 9:07:13 AM By: Gretta Cool, BSN, RN, CWS, Kim RN, BSN Entered By: Gretta Cool, BSN, RN, CWS, Kim on 10/22/2017 08:29:37 Jose Stone ([id number]) -------------------------------------------------------------------------------- Nutrition Risk Assessment Details Patient Name: Jose Stone Date of Service: 10/22/2017 8:30 AM Medical Record Number: [id number] Patient Account Number: [id number] Date of Birth/Sex: 01/16/1946 (71 y.o. Male) Treating RN: Cornell Barman Primary Care Chirsty Armistead: Kathyrn Lass Other Clinician: Referring Leonette Tischer: Referral, Self Treating Shantai Tiedeman/Extender: Cathie Olden in Treatment: 0 Height (in): 73 Weight (lbs): 236 Body Mass Index (BMI): 31.1 Nutrition Risk Assessment Items NUTRITION RISK SCREEN: I have an illness or condition that made me change the kind and/or amount of 0 No food I eat I eat fewer than two meals per day 0 No I eat few fruits and vegetables, or milk products 0 No I have three or more drinks of beer, liquor or wine almost every day 0 No I have tooth or mouth problems that make it hard for me to eat 0 No I don't always have enough money to buy the food I need 0 No I eat alone most of the time 0 No I take three or more different prescribed or over-the-counter drugs a day 1 Yes Without wanting to, I have lost or gained 10 pounds in the last six months 0 No I am not always physically able to shop, cook and/or feed myself 0 No Nutrition Protocols Good Risk Protocol 0 No interventions needed Moderate Risk Protocol Electronic Signature(s) Signed: 10/22/2017 9:07:13 AM By: Gretta Cool, BSN, RN, CWS, Kim RN, BSN Entered By: Gretta Cool, BSN, RN, CWS, Kim on 10/22/2017 08:29:19

## 2017-10-24 NOTE — Progress Notes (Signed)
Jose Stone, Jose Stone ([id number]) Visit Report for 10/22/2017 Chief Complaint Document Details Patient Name: Jose Stone, Jose Stone Date of Service: 10/22/2017 8:30 AM Medical Record Number: [id number] Patient Account Number: [id number] Date of Birth/Sex: [birth year] (71 y.o. Male) Treating RN: Cornell Barman Primary Care Provider: Kathyrn Lass Other Clinician: Referring Provider: Referral, Self Treating Provider/Extender: Cathie Olden in Treatment: 0 Information Obtained from: Patient Chief Complaint He is here for initial evaluation of a left great toe blister Electronic Signature(s) Signed: 10/22/2017 4:55:16 PM By: Lawanda Cousins Entered By: Lawanda Cousins on 10/22/2017 09:50:05 Jose Stone ([id number]) -------------------------------------------------------------------------------- Debridement Details Patient Name: Jose Stone Date of Service: 10/22/2017 8:30 AM Medical Record Number: [id number] Patient Account Number: [id number] Date of Birth/Sex: 14-Feb-1946 (71 y.o. Male) Treating RN: Cornell Barman Primary Care Provider: Kathyrn Lass Other Clinician: Referring Provider: Referral, Self Treating Provider/Extender: Cathie Olden in Treatment: 0 Debridement Performed for Wound #1 Left Toe Great Assessment: Performed By: Physician Lawanda Cousins, NP Debridement: Open Wound/Selective Debridement Description: Selective Pre-procedure Verification/Time Yes - 08:39 Out Taken: Start Time: 08:39 Pain Control: Other : lidocaine 4% Level: Skin/Epidermis Total Area Debrided (L x W): 2.4 (cm) x 3.7 (cm) = 8.88 (cm) Tissue and other material Viable, Non-Viable, Callus, Fat, Fibrin/Slough, Skin debrided: Instrument: Blade, Forceps Bleeding: Moderate Hemostasis Achieved: Pressure End Time: 08:46 Procedural Pain: 0 Post Procedural Pain: 0 Response to Treatment: Procedure was tolerated well Post Debridement Measurements of Total Wound Length: (cm) 2 Stage: Category/Stage II Width: (cm)  4.5 Depth: (cm) 0.3 Volume: (cm) 2.121 Character of Wound/Ulcer Post Stable Debridement: Post Procedure Diagnosis Same as Pre-procedure Electronic Signature(s) Signed: 10/22/2017 4:55:16 PM By: Lawanda Cousins Signed: 10/22/2017 5:08:53 PM By: Gretta Cool, BSN, RN, CWS, Kim RN, BSN Previous Signature: 10/22/2017 9:07:13 AM Version By: Gretta Cool, BSN, RN, CWS, Kim RN, BSN Entered By: Lawanda Cousins on 10/22/2017 09:48:46 Jose Stone, Jose Stone ([id number]) -------------------------------------------------------------------------------- HPI Details Patient Name: Jose Stone Date of Service: 10/22/2017 8:30 AM Medical Record Number: [id number] Patient Account Number: [id number] Date of Birth/Sex: 11-24-1945 (71 y.o. Male) Treating RN: Cornell Barman Primary Care Provider: Kathyrn Lass Other Clinician: Referring Provider: Referral, Self Treating Provider/Extender: Cathie Olden in Treatment: 0 History of Present Illness Location: Patient presents with an ulcer on the left Great toe Quality: no pain Severity: no pain Duration: has been present for approximately one month Context: blister/wound occurs with recurrent friction and pressure HPI Description: 10/22/17- he is here for initial evaluation of the left great toe blister. He was recently seen at [facility] wound care center, discharge on 10/18 for ulceration in the same place. He is nondiabetic. He has not neuropathic. During his outpatient treatment he was given a Darco front offloading shoe, was not prescribed orthotic evaluation for shoe insert. I think he would most benefit long-term from an orthotic insert. He states that approximately 1 month after discharge she noticed a formation of callus and developed significant blister after walking in work boots last week. He denies fever, chills, overall ill feeling. He denies pain. Electronic Signature(s) Signed: 10/22/2017 4:55:16 PM By: Lawanda Cousins Entered By: Lawanda Cousins on 10/22/2017  09:53:54 Jose Stone ([id number]) -------------------------------------------------------------------------------- Physical Exam Details Patient Name: Jose Stone Date of Service: 10/22/2017 8:30 AM Medical Record Number: [id number] Patient Account Number: [id number] Date of Birth/Sex: 10-29-1946 (71 y.o. Male) Treating RN: Cornell Barman Primary Care Provider: Kathyrn Lass Other Clinician: Referring Provider: Referral, Self Treating Provider/Extender: Cathie Olden in Treatment: 0 Constitutional wnl. afebrile. Respiratory non-labored. clear throughout. Cardiovascular s1 s2 regular, rate controlled. LLE- palpable DP, PT. Musculoskeletal ambulates  without assistance. Neurological non-neuropathic. Psychiatric appears to have appropriate insight and judgement. oriented x4. calm, pleasant, cooperative. Electronic Signature(s) Signed: 10/22/2017 4:55:16 PM By: Lawanda Cousins Entered By: Lawanda Cousins on 10/22/2017 09:57:21 Jose Stone ([id number]) -------------------------------------------------------------------------------- Physician Orders Details Patient Name: Jose Stone Date of Service: 10/22/2017 8:30 AM Medical Record Number: [id number] Patient Account Number: [id number] Date of Birth/Sex: Sep 22, 1946 (71 y.o. Male) Treating RN: Cornell Barman Primary Care Provider: Kathyrn Lass Other Clinician: Referring Provider: Referral, Self Treating Provider/Extender: Cathie Olden in Treatment: 0 Verbal / Phone Orders: No Diagnosis Coding Wound Cleansing Wound #1 Left Toe Great o Clean wound with Normal Saline. o Cleanse wound with mild soap and water Anesthetic (add to Medication List) Wound #1 Left Toe Great o Topical Lidocaine 4% cream applied to wound bed prior to debridement (In Clinic Only). Primary Wound Dressing Wound #1 Left Toe Great o Silvercel Non-Adherent Secondary Dressing Wound #1 Left Toe Great o Gauze and Kerlix/Conform Dressing Change  Frequency Wound #1 Left Toe Great o Three times weekly Follow-up Appointments o Return Appointment in 1 week. Off-Loading Wound #1 Left Toe Great o Other: - Front off-loader Additional Orders / Instructions Wound #1 Left Toe Great o Other: - Need insert in shoes Patient Medications Allergies: Opioids - Morphine Analogues, Neosporin (neo-bac-polym), Topical Antibiotics Notifications Medication Indication Start End lidocaine DOSE topical 4 % cream - cream topical Jose Stone, Jose Stone ([id number]) Electronic Signature(s) Signed: 10/22/2017 4:55:16 PM By: Lawanda Cousins Previous Signature: 10/22/2017 9:07:13 AM Version By: Gretta Cool, BSN, RN, CWS, Kim RN, BSN Entered By: Lawanda Cousins on 10/22/2017 10:09:24 Jose Stone, Jose Stone ([id number]) -------------------------------------------------------------------------------- Prescription 10/22/2017 Patient Name: Jose Stone Provider: Lawanda Cousins NP Date of Birth: [birth year] NPI#: [id number] Sex: M DEA#: [id number] Phone #: [phone] License #: Patient Address: [address] [place], [place] [address] [address] [place], [place] [address] [phone] Allergies Opioids - Morphine Analogues Reaction: Rash Neosporin (neo-bac-polym) Topical Antibiotics Medication Medication: Route: Strength: Form: lidocaine 4 % topical cream topical 4% cream Class: TOPICAL LOCAL ANESTHETICS Dose: Frequency / Time: Indication: cream topical Number of Refills: Number of Units: 0 Generic Substitution: Start Date: End Date: One Time Use: Substitution Permitted No Note to Pharmacy: Signature(s): Date(s): Electronic Signature(s) Signed: 10/22/2017 4:55:16 PM By: Johnathan Hausen ([id number]) Previous Signature: 10/22/2017 9:07:13 AM Version By: Gretta Cool, BSN, RN, CWS, Kim RN, BSN Entered By: Lawanda Cousins on 10/22/2017 10:09:25 Jose Stone  ([id number]) --------------------------------------------------------------------------------  Problem List Details Patient Name: Jose Stone Date of Service: 10/22/2017 8:30 AM Medical Record Number: [id number] Patient Account Number: [id number] Date of Birth/Sex: 25-Mar-1946 (71 y.o. Male) Treating RN: Cornell Barman Primary Care Provider: Kathyrn Lass Other Clinician: Referring Provider: Referral, Self Treating Provider/Extender: Cathie Olden in Treatment: 0 Active Problems ICD-10 Encounter Code Description Active Date Diagnosis L84 Corns and callosities 10/22/2017 Yes S90.422A Blister (nonthermal), left great toe, initial encounter 10/22/2017 Yes L89.92 Pressure ulcer of unspecified site, stage 2 10/22/2017 Yes Inactive Problems Resolved Problems Electronic Signature(s) Signed: 10/22/2017 4:55:16 PM By: Lawanda Cousins Entered By: Lawanda Cousins on 10/22/2017 09:49:40 Jose Stone ([id number]) -------------------------------------------------------------------------------- Progress Note Details Patient Name: Jose Stone Date of Service: 10/22/2017 8:30 AM Medical Record Number: [id number] Patient Account Number: [id number] Date of Birth/Sex: 04-20-1946 (71 y.o. Male) Treating RN: Cornell Barman Primary Care Provider: Kathyrn Lass Other Clinician: Referring Provider: Referral, Self Treating Provider/Extender: Cathie Olden in Treatment: 0 Subjective Chief Complaint Information obtained from Patient He is here for initial evaluation of a  left great toe blister History of Present Illness (HPI) The following HPI elements were documented for the patient's wound: Location: Patient presents with an ulcer on the left Great toe Quality: no pain Severity: no pain Duration: has been present for approximately one month Context: blister/wound occurs with recurrent friction and pressure 10/22/17- he is here for initial evaluation of the left great toe blister. He was recently  seen at [facility] wound care center, discharge on 10/18 for ulceration in the same place. He is nondiabetic. He has not neuropathic. During his outpatient treatment he was given a Darco front offloading shoe, was not prescribed orthotic evaluation for shoe insert. I think he would most benefit long-term from an orthotic insert. He states that approximately 1 month after discharge she noticed a formation of callus and developed significant blister after walking in work boots last week. He denies fever, chills, overall ill feeling. He denies pain. Wound History Patient presents with 1 open wound that has been present for approximately 1 month. Patient has been treating wound in the following manner: vaseline. The wound has been healed in the past but has re-opened. Laboratory tests have not been performed in the last month. Patient reportedly has not tested positive for an antibiotic resistant organism. Patient reportedly has not tested positive for osteomyelitis. Patient reportedly has not had testing performed to evaluate circulation in the legs. Patient History Information obtained from Patient. Allergies Opioids - Morphine Analogues (Reaction: Rash), Neosporin (neo-bac-polym), Topical Antibiotics Family History Cancer - Mother, Heart Disease - Mother, Hypertension - Mother,Siblings, Stroke - Father, No family history of Diabetes, Kidney Disease, Lung Disease, Seizures, Thyroid Problems, Tuberculosis. Social History Former smoker, Marital Status - Married, Alcohol Use - Never, Drug Use - No History, Caffeine Use - Never. Medical History Eyes Patient has history of Cataracts - mild Denies history of Glaucoma, Optic Neuritis Ear/Nose/Mouth/Throat Denies history of Chronic sinus problems/congestion, Middle ear problems Jose Stone, Jose Stone ([id number]) Hematologic/Lymphatic Denies history of Anemia, Hemophilia, Human Immunodeficiency Virus, Lymphedema, Sickle Cell Disease Respiratory Denies  history of Aspiration, Asthma, Chronic Obstructive Pulmonary Disease (COPD), Pneumothorax, Sleep Apnea Cardiovascular Patient has history of Angina, Arrhythmia, Coronary Artery Disease, Hypertension, Myocardial Infarction - 2015 Denies history of Congestive Heart Failure, Deep Vein Thrombosis, Hypotension, Peripheral Arterial Disease, Peripheral Venous Disease, Phlebitis, Vasculitis Gastrointestinal Denies history of Cirrhosis , Colitis, Crohn s, Hepatitis A, Hepatitis B, Hepatitis C Endocrine Denies history of Type I Diabetes, Type II Diabetes Genitourinary Denies history of End Stage Renal Disease Immunological Denies history of Lupus Erythematosus, Raynaud s, Scleroderma Integumentary (Skin) Patient has history of History of pressure wounds Denies history of History of Burn Musculoskeletal Patient has history of Gout Denies history of Rheumatoid Arthritis, Osteoarthritis, Osteomyelitis Neurologic Denies history of Dementia, Neuropathy, Quadriplegia, Paraplegia, Seizure Disorder Oncologic Denies history of Received Chemotherapy, Received Radiation Psychiatric Denies history of Anorexia/bulimia, Confinement Anxiety Medical And Surgical History Notes Constitutional Symptoms (General Health) Gout, VSD, A-fib, MI 2015; Review of Systems (ROS) Constitutional Symptoms (General Health) The patient has no complaints or symptoms. Eyes Complains or has symptoms of Glasses / Contacts. Ear/Nose/Mouth/Throat The patient has no complaints or symptoms. Hematologic/Lymphatic The patient has no complaints or symptoms. Respiratory The patient has no complaints or symptoms. Cardiovascular The patient has no complaints or symptoms. Gastrointestinal The patient has no complaints or symptoms. Endocrine Denies complaints or symptoms of Hepatitis, Thyroid disease, Polydypsia (Excessive Thirst). Genitourinary The patient has no complaints or symptoms. Immunological The patient has no  complaints or symptoms. Integumentary (Skin) Complains or has  symptoms of Wounds, Bleeding or bruising tendency. Denies complaints or symptoms of Breakdown, Swelling. Musculoskeletal The patient has no complaints or symptoms. Neurologic Jose Stone, Jose Stone ([id number]) The patient has no complaints or symptoms. Oncologic Melanoma Psychiatric The patient has no complaints or symptoms. Objective Constitutional wnl. afebrile. Vitals Time Taken: 8:17 AM, Height: 73 in, Weight: 236 lbs, BMI: 31.1, Temperature: 98.2 F, Pulse: 75 bpm, Respiratory Rate: 16 breaths/min, Blood Pressure: 137/85 mmHg. Respiratory non-labored. clear throughout. Cardiovascular s1 s2 regular, rate controlled. LLE- palpable DP, PT. Musculoskeletal ambulates without assistance. Neurological non-neuropathic. Psychiatric appears to have appropriate insight and judgement. oriented x4. calm, pleasant, cooperative. Integumentary (Hair, Skin) Wound #1 status is Open. Original cause of wound was Pressure Injury. The wound is located on the Left Toe Great. The wound measures 2.4cm length x 3.7cm width x 0.1cm depth; 6.974cm^2 area and 0.697cm^3 volume. The wound is limited to skin breakdown. There is no tunneling or undermining noted. There is a none present amount of drainage noted. The wound margin is indistinct and nonvisible. There is no granulation within the wound bed. There is no necrotic tissue within the wound bed. The periwound skin appearance exhibited: Ecchymosis. Assessment Active Problems ICD-10 L84 - Corns and callosities S90.422A - Blister (nonthermal), left great toe, initial encounter L89.92 - Pressure ulcer of unspecified site, stage 2 Jose Stone, Jose Stone ([id number]) -for long-term management think he would benefit from orthotic insert Procedures Wound #1 Pre-procedure diagnosis of Wound #1 is a Pressure Ulcer located on the Left Toe Great . There was a Skin/Epidermis Open Wound/Selective [phone])  debridement with total area of 8.88 sq cm performed by Lawanda Cousins, NP. with the following instrument(s): Blade and Forceps to remove Viable and Non-Viable tissue/material including Fat Layer (Subcutaneous Tissue) Exposed, Fibrin/Slough, Skin, and Callus after achieving pain control using Other (lidocaine 4%). A time out was conducted at 08:39, prior to the start of the procedure. A Moderate amount of bleeding was controlled with Pressure. The procedure was tolerated well with a pain level of 0 throughout and a pain level of 0 following the procedure. Post Debridement Measurements: 2cm length x 4.5cm width x 0.3cm depth; 2.121cm^3 volume. Post debridement Stage noted as Category/Stage II. Character of Wound/Ulcer Post Debridement is stable. Post procedure Diagnosis Wound #1: Same as Pre-Procedure Plan Wound Cleansing: Wound #1 Left Toe Great: Clean wound with Normal Saline. Cleanse wound with mild soap and water Anesthetic (add to Medication List): Wound #1 Left Toe Great: Topical Lidocaine 4% cream applied to wound bed prior to debridement (In Clinic Only). Primary Wound Dressing: Wound #1 Left Toe Great: Silvercel Non-Adherent Secondary Dressing: Wound #1 Left Toe Great: Gauze and Kerlix/Conform Dressing Change Frequency: Wound #1 Left Toe Great: Three times weekly Follow-up Appointments: Return Appointment in 1 week. Off-Loading: Wound #1 Left Toe Great: Other: - Front off-loader Additional Orders / Instructions: Wound #1 Left Toe Great: Other: - Need insert in shoes The following medication(s) was prescribed: lidocaine topical 4 % cream cream topical was prescribed at facility [place], Jose Stone ([id number]) 1. silvercell, dry dressing 2. offloading 3. follow up next week Electronic Signature(s) Signed: 10/22/2017 4:55:16 PM By: Lawanda Cousins Entered By: Lawanda Cousins on 10/22/2017 10:10:53 Jose Stone  ([id number]) -------------------------------------------------------------------------------- ROS/PFSH Details Patient Name: Jose Stone Date of Service: 10/22/2017 8:30 AM Medical Record Number: [id number] Patient Account Number: [id number] Date of Birth/Sex: 02-06-1946 (71 y.o. Male) Treating RN: Cornell Barman Primary Care Provider: Kathyrn Lass Other Clinician: Referring Provider: Referral, Self Treating Provider/Extender: Cathie Olden in Treatment: 0 Information Obtained  From Patient Wound History Do you currently have one or more open woundso Yes How many open wounds do you currently haveo 1 Approximately how long have you had your woundso 1 month How have you been treating your wound(s) until nowo vaseline Has your wound(s) ever healed and then re-openedo Yes Have you had any lab work done in the past montho No Have you tested positive for an antibiotic resistant organism (MRSA, VRE)o No Have you tested positive for osteomyelitis (bone infection)o No Have you had any tests for circulation on your legso No Eyes Complaints and Symptoms: Positive for: Glasses / Contacts Medical History: Positive for: Cataracts - mild Negative for: Glaucoma; Optic Neuritis Hematologic/Lymphatic Complaints and Symptoms: No Complaints or Symptoms Complaints and Symptoms: Negative for: Human Immunodeficiency Virus Medical History: Negative for: Anemia; Hemophilia; Human Immunodeficiency Virus; Lymphedema; Sickle Cell Disease Cardiovascular Complaints and Symptoms: No Complaints or Symptoms Complaints and Symptoms: Negative for: Chest pain; LE edema Medical History: Positive for: Angina; Arrhythmia; Coronary Artery Disease; Hypertension; Myocardial Infarction - 2015 Negative for: Congestive Heart Failure; Deep Vein Thrombosis; Hypotension; Peripheral Arterial Disease; Peripheral Venous Disease; Phlebitis; Vasculitis Endocrine Complaints and Symptoms: Negative for: Hepatitis; Thyroid  disease; Polydypsia (Excessive Thirst) Jose Stone, Jose Stone ([id number]) Medical History: Negative for: Type I Diabetes; Type II Diabetes Integumentary (Skin) Complaints and Symptoms: Positive for: Wounds; Bleeding or bruising tendency Negative for: Breakdown; Swelling Medical History: Positive for: History of pressure wounds Negative for: History of Burn Constitutional Symptoms (General Health) Complaints and Symptoms: No Complaints or Symptoms Medical History: Past Medical History Notes: Gout, VSD, A-fib, MI 75; Ear/Nose/Mouth/Throat Complaints and Symptoms: No Complaints or Symptoms Medical History: Negative for: Chronic sinus problems/congestion; Middle ear problems Respiratory Complaints and Symptoms: No Complaints or Symptoms Medical History: Negative for: Aspiration; Asthma; Chronic Obstructive Pulmonary Disease (COPD); Pneumothorax; Sleep Apnea Gastrointestinal Complaints and Symptoms: No Complaints or Symptoms Medical History: Negative for: Cirrhosis ; Colitis; Crohnos; Hepatitis A; Hepatitis B; Hepatitis C Genitourinary Complaints and Symptoms: No Complaints or Symptoms Medical History: Negative for: End Stage Renal Disease Immunological Complaints and Symptoms: No Complaints or Symptoms Jose Stone, Jose Stone ([id number]) Medical History: Negative for: Lupus Erythematosus; Raynaudos; Scleroderma Musculoskeletal Complaints and Symptoms: No Complaints or Symptoms Medical History: Positive for: Gout Negative for: Rheumatoid Arthritis; Osteoarthritis; Osteomyelitis Neurologic Complaints and Symptoms: No Complaints or Symptoms Medical History: Negative for: Dementia; Neuropathy; Quadriplegia; Paraplegia; Seizure Disorder Oncologic Complaints and Symptoms: Review of System Notes: Melanoma Medical History: Negative for: Received Chemotherapy; Received Radiation Psychiatric Complaints and Symptoms: No Complaints or Symptoms Medical History: Negative for:  Anorexia/bulimia; Confinement Anxiety HBO Extended History Items Eyes: Cataracts Immunizations Pneumococcal Vaccine: Received Pneumococcal Vaccination: Yes Implantable Devices Family and Social History Cancer: Yes - Mother; Diabetes: No; Heart Disease: Yes - Mother; Hypertension: Yes - Mother,Siblings; Kidney Disease: No; Lung Disease: No; Seizures: No; Stroke: Yes - Father; Thyroid Problems: No; Tuberculosis: No; Former smoker; Marital Status - Married; Alcohol Use: Never; Drug Use: No History; Caffeine Use: Never; Financial Concerns: No; Food, Clothing or Shelter Needs: No; Support System Lacking: No; Transportation Concerns: No; Advanced Directives: Yes (Not Provided); Patient does not want information on Advanced Directives; Living Will: Yes (Not Provided); Medical Power of Attorney: Yes (Not Provided) Electronic Signature(s) Signed: 10/22/2017 9:07:13 AM By: Gretta Cool, BSN, RN, CWS, Kim RN, BSN Jose Stone [place], [place] ([id number]) Signed: 10/22/2017 4:55:16 PM By: Lawanda Cousins Entered By: Gretta Cool BSN, RN, CWS, Kim on 10/22/2017 08:27:50 ZAKYE, BABY ([id number]) -------------------------------------------------------------------------------- [place] Details Patient Name: Jose Stone Date of Service: 10/22/2017 Medical Record Number: [id number] Patient Account  Number: [id number] Date of Birth/Sex: 1946/11/02 (71 y.o. Male) Treating RN: Cornell Barman Primary Care Provider: Kathyrn Lass Other Clinician: Referring Provider: Referral, Self Treating Provider/Extender: Cathie Olden in Treatment: 0 Diagnosis Coding ICD-10 Codes Code Description L84 Corns and callosities S90.422A Blister (nonthermal), left great toe, initial encounter L89.92 Pressure ulcer of unspecified site, stage 2 Facility Procedures CPT4 Code: [id number] Description: 75916 - WOUND CARE VISIT-LEV 3 EST PT Modifier: Quantity: 1 CPT4 Code: [id number] Description: 35701 - DEBRIDE WOUND 1ST 20 SQ CM OR < ICD-10 Diagnosis  Description L89.92 Pressure ulcer of unspecified site, stage 2 S90.422A Blister (nonthermal), left great toe, initial encounter L84 Corns and callosities Modifier: Quantity: 1 Physician Procedures CPT4 Code: [id number] Description: 00923 - WC PHYS DEBR WO ANESTH 20 SQ CM ICD-10 Diagnosis Description L89.92 Pressure ulcer of unspecified site, stage 2 S90.422A Blister (nonthermal), left great toe, initial encounter L84 Corns and callosities Modifier: Quantity: 1 Electronic Signature(s) Signed: 10/22/2017 4:55:16 PM By: Lawanda Cousins Entered By: Lawanda Cousins on 10/22/2017 10:11:16

## 2017-10-24 NOTE — Progress Notes (Signed)
ELDRA, WORD ([id number]) Visit Report for 10/22/2017 Allergy List Details Patient Name: Jose Stone, Jose Stone Date of Service: 10/22/2017 8:30 AM Medical Record Number: [id number] Patient Account Number: [id number] Date of Birth/Sex: August 26, 1946 (71 y.o. Male) Treating RN: Cornell Barman Primary Care Franceska Strahm: Kathyrn Lass Other Clinician: Referring Caterin Tabares: Referral, Self Treating Kyoko Elsea/Extender: Cathie Olden in Treatment: 0 Allergies Active Allergies Opioids - Morphine Analogues Reaction: Rash Neosporin (neo-bac-polym) Topical Antibiotics Allergy Notes Electronic Signature(s) Signed: 10/22/2017 9:07:13 AM By: Gretta Cool, BSN, RN, CWS, Kim RN, BSN Entered By: Gretta Cool, BSN, RN, CWS, Kim on 10/22/2017 08:19:29 Jose Stone ([id number]) -------------------------------------------------------------------------------- Arrival Information Details Patient Name: Jose Stone Date of Service: 10/22/2017 8:30 AM Medical Record Number: [id number] Patient Account Number: [id number] Date of Birth/Sex: [birth year] (71 y.o. Male) Treating RN: Cornell Barman Primary Care Crystian Frith: Kathyrn Lass Other Clinician: Referring Paislyn Domenico: Referral, Self Treating Cattleya Dobratz/Extender: Cathie Olden in Treatment: 0 Visit Information Patient Arrived: Ambulatory Arrival Time: 08:16 Accompanied By: self Transfer Assistance: None Patient Identification Verified: Yes Secondary Verification Process Yes Completed: Patient Requires Transmission-Based No Precautions: Patient Has Alerts: Yes Patient Alerts: Patient on Blood Thinner xalerto, 81mg  aspirin NOT DIABETIC Electronic Signature(s) Signed: 10/22/2017 9:07:13 AM By: Gretta Cool, BSN, RN, CWS, Kim RN, BSN Entered By: Gretta Cool, BSN, RN, CWS, Kim on 10/22/2017 08:17:15 Jose Stone ([id number]) -------------------------------------------------------------------------------- Clinic Level of Care Assessment Details Patient Name: Jose Stone Date of Service:  10/22/2017 8:30 AM Medical Record Number: [id number] Patient Account Number: [id number] Date of Birth/Sex: 1946/01/23 (71 y.o. Male) Treating RN: Cornell Barman Primary Care Damain Broadus: Kathyrn Lass Other Clinician: Referring Trenesha Alcaide: Referral, Self Treating Sharin Altidor/Extender: Cathie Olden in Treatment: 0 Clinic Level of Care Assessment Items TOOL 1 Quantity Score []  - Use when EandM and Procedure is performed on INITIAL visit 0 ASSESSMENTS - Nursing Assessment / Reassessment X - General Physical Exam (combine w/ comprehensive assessment (listed just below) when 1 20 performed on new pt. evals) X- 1 25 Comprehensive Assessment (HX, ROS, Risk Assessments, Wounds Hx, etc.) ASSESSMENTS - Wound and Skin Assessment / Reassessment []  - Dermatologic / Skin Assessment (not related to wound area) 0 ASSESSMENTS - Ostomy and/or Continence Assessment and Care []  - Incontinence Assessment and Management 0 []  - 0 Ostomy Care Assessment and Management (repouching, etc.) PROCESS - Coordination of Care X - Simple Patient / Family Education for ongoing care 1 15 []  - 0 Complex (extensive) Patient / Family Education for ongoing care X- 1 10 Staff obtains Programmer, systems, Records, Test Results / Process Orders []  - 0 Staff telephones HHA, Nursing Homes / Clarify orders / etc []  - 0 Routine Transfer to another Facility (non-emergent condition) []  - 0 Routine Hospital Admission (non-emergent condition) X- 1 15 New Admissions / Biomedical engineer / Ordering NPWT, Apligraf, etc. []  - 0 Emergency Hospital Admission (emergent condition) PROCESS - Special Needs []  - Pediatric / Minor Patient Management 0 []  - 0 Isolation Patient Management []  - 0 Hearing / Language / Visual special needs []  - 0 Assessment of Community assistance (transportation, D/C planning, etc.) []  - 0 Additional assistance / Altered mentation []  - 0 Support Surface(s) Assessment (bed, cushion, seat, etc.) Trott, Okey  ([id number]) INTERVENTIONS - Miscellaneous []  - External ear exam 0 []  - 0 Patient Transfer (multiple staff / Civil Service fast streamer / Similar devices) []  - 0 Simple Staple / Suture removal (25 or less) []  - 0 Complex Staple / Suture removal (26 or more) []  - 0 Hypo/Hyperglycemic Management (do not check if billed separately) []  - 0 Ankle / Brachial Index (  ABI) - do not check if billed separately Has the patient been seen at the hospital within the last three years: Yes Total Score: 85 Level Of Care: New/Established - Level 3 Electronic Signature(s) Signed: 10/22/2017 9:07:13 AM By: Gretta Cool, BSN, RN, CWS, Kim RN, BSN Entered By: Gretta Cool, BSN, RN, CWS, Kim on 10/22/2017 09:04:38 Jose Stone ([id number]) -------------------------------------------------------------------------------- Encounter Discharge Information Details Patient Name: Jose Stone Date of Service: 10/22/2017 8:30 AM Medical Record Number: [id number] Patient Account Number: [id number] Date of Birth/Sex: March 02, 1946 (71 y.o. Male) Treating RN: Cornell Barman Primary Care Quindell Shere: Kathyrn Lass Other Clinician: Referring Tarry Blayney: Referral, Self Treating Alice Burnside/Extender: Cathie Olden in Treatment: 0 Encounter Discharge Information Items Discharge Pain Level: 0 Discharge Condition: Stable Ambulatory Status: Ambulatory Discharge Destination: Home Transportation: Private Auto Accompanied By: self Schedule Follow-up Appointment: Yes Medication Reconciliation completed and Yes provided to Patient/Care Adalberto Metzgar: Patient Clinical Summary of Care: Declined Electronic Signature(s) Signed: 10/22/2017 9:07:13 AM By: Gretta Cool, BSN, RN, CWS, Kim RN, BSN Entered By: Gretta Cool, BSN, RN, CWS, Kim on 10/22/2017 09:05:35 Jose Stone ([id number]) -------------------------------------------------------------------------------- Lower Extremity Assessment Details Patient Name: Jose Stone Date of Service: 10/22/2017 8:30 AM Medical Record  Number: [id number] Patient Account Number: [id number] Date of Birth/Sex: 07-10-1946 (71 y.o. Male) Treating RN: Cornell Barman Primary Care Bhumi Godbey: Kathyrn Lass Other Clinician: Referring Roxi Hlavaty: Referral, Self Treating Teaghan Melrose/Extender: Cathie Olden in Treatment: 0 Vascular Assessment Pulses: Dorsalis Pedis Palpable: [Left:Yes] Posterior Tibial Palpable: [Left:Yes] Extremity colors, hair growth, and conditions: Extremity Color: [Left:Normal] Hair Growth on Extremity: [Left:Yes] Temperature of Extremity: [Left:Warm] Capillary Refill: [Left:< 3 seconds] Toe Nail Assessment Left: Right: Thick: No Discolored: No Deformed: No Improper Length and Hygiene: No Notes ABI 1.18 on left from prevoius visit. Electronic Signature(s) Signed: 10/22/2017 9:07:13 AM By: Gretta Cool, BSN, RN, CWS, Kim RN, BSN Entered By: Gretta Cool, BSN, RN, CWS, Kim on 10/22/2017 08:32:44 Jose Stone ([id number]) -------------------------------------------------------------------------------- Multi Wound Chart Details Patient Name: Jose Stone Date of Service: 10/22/2017 8:30 AM Medical Record Number: [id number] Patient Account Number: [id number] Date of Birth/Sex: May 13, 1946 (71 y.o. Male) Treating RN: Cornell Barman Primary Care Aleem Elza: Kathyrn Lass Other Clinician: Referring Khaleem Burchill: Referral, Self Treating Cindie Rajagopalan/Extender: Cathie Olden in Treatment: 0 Vital Signs Height(in): 73 Pulse(bpm): 75 Weight(lbs): 236 Blood Pressure(mmHg): 137/85 Body Mass Index(BMI): 31 Temperature(F): 98.2 Respiratory Rate 16 (breaths/min): Photos: [N/A:N/A] Wound Location: Left Toe Great N/A N/A Wounding Event: Pressure Injury N/A N/A Primary Etiology: Pressure Ulcer N/A N/A Comorbid History: Cataracts, Angina, Arrhythmia, N/A N/A Coronary Artery Disease, Hypertension, Myocardial Infarction, History of pressure wounds, Gout Date Acquired: 09/22/2017 N/A N/A Weeks of Treatment: 0 N/A N/A Wound Status:  Open N/A N/A Measurements L x W x D 2.4x3.7x0.1 N/A N/A (cm) Area (cm) : 6.974 N/A N/A Volume (cm) : 0.697 N/A N/A % Reduction in Area: 0.00% N/A N/A % Reduction in Volume: 0.00% N/A N/A Classification: Unstageable/Unclassified N/A N/A Exudate Amount: None Present N/A N/A Wound Margin: Indistinct, nonvisible N/A N/A Granulation Amount: None Present (0%) N/A N/A Necrotic Amount: None Present (0%) N/A N/A Exposed Structures: Fascia: No N/A N/A Fat Layer (Subcutaneous Tissue) Exposed: No [place], Hammond ([id number]) Tendon: No Muscle: No Joint: No Bone: No Limited to Skin Breakdown Debridement: Debridement ([id number]) N/A N/A Pre-procedure 08:39 N/A N/A Verification/Time Out Taken: Pain Control: Other N/A N/A Tissue Debrided: Fat, Skin, Callus, N/A N/A Subcutaneous Level: Skin/Subcutaneous Tissue N/A N/A Debridement Area (sq cm): 8.88 N/A N/A Instrument: Blade, Forceps N/A N/A Bleeding: Moderate N/A N/A Hemostasis Achieved: Pressure N/A N/A Procedural Pain: 0 N/A N/A Post  Procedural Pain: 0 N/A N/A Debridement Treatment Procedure was tolerated well N/A N/A Response: Post Debridement 2x4.5x0.3 N/A N/A Measurements L x W x D (cm) Post Debridement Volume: 2.121 N/A N/A (cm) Post Debridement Stage: Category/Stage II N/A N/A Periwound Skin Texture: No Abnormalities Noted N/A N/A Periwound Skin Moisture: No Abnormalities Noted N/A N/A Periwound Skin Color: Ecchymosis: Yes N/A N/A Tenderness on Palpation: No N/A N/A Wound Preparation: Ulcer Cleansing: N/A N/A Rinsed/Irrigated with Saline Topical Anesthetic Applied: Other: idocaine 4% Procedures Performed: Debridement N/A N/A Treatment Notes Wound #1 (Left Toe Great) 1. Cleansed with: Clean wound with Normal Saline 2. Anesthetic Topical Lidocaine 4% cream to wound bed prior to debridement 4. Dressing Applied: Other dressing (specify in notes) 5. Secondary Dressing Applied Kerlix/Conform 6. Footwear/Offloading  device applied Wedge shoe Notes [employer] Electronic Signature(s) Signed: 10/22/2017 4:55:16 PM By: Johnathan Hausen ([id number]) Previous Signature: 10/22/2017 9:07:13 AM Version By: Gretta Cool, BSN, RN, CWS, Kim RN, BSN Entered By: Lawanda Cousins on 10/22/2017 09:47:52 JASAUN, CARN ([id number]) -------------------------------------------------------------------------------- [place] Details Patient Name: Jose Stone Date of Service: 10/22/2017 8:30 AM Medical Record Number: [id number] Patient Account Number: [id number] Date of Birth/Sex: [birth year] (71 y.o. Male) Treating RN: Cornell Barman Primary Care Evelyne Makepeace: Kathyrn Lass Other Clinician: Referring Taunia Frasco: Referral, Self Treating Lempi Edwin/Extender: Cathie Olden in Treatment: 0 Active Inactive Electronic Signature(s) Signed: 10/22/2017 9:07:13 AM By: Gretta Cool, BSN, RN, CWS, Kim RN, BSN Entered By: Gretta Cool, BSN, RN, CWS, Kim on 10/22/2017 08:38:49 Jose Stone ([id number]) -------------------------------------------------------------------------------- Pain Assessment Details Patient Name: Jose Stone Date of Service: 10/22/2017 8:30 AM Medical Record Number: [id number] Patient Account Number: [id number] Date of Birth/Sex: 1946/09/01 (71 y.o. Male) Treating RN: Cornell Barman Primary Care Anvika Gashi: Kathyrn Lass Other Clinician: Referring Soua Lenk: Referral, Self Treating Denelle Capurro/Extender: Cathie Olden in Treatment: 0 Active Problems Location of Pain Severity and Description of Pain Patient Has Paino No Site Locations With Dressing Change: No Rate the pain. Current Pain Level: 1 Pain Management and Medication Current Pain Management: Goals for Pain Management Topical or injectable lidocaine is offered to patient for acute pain when surgical debridement is performed. If needed, Patient is instructed to use over the counter pain medication for the following 24-48 hours after debridement.  Wound care MDs do not prescribed pain medications. Patient has chronic pain or uncontrolled pain. Patient has been instructed to make an appointment with their Primary Care Physician for pain management. Electronic Signature(s) Signed: 10/22/2017 9:07:13 AM By: Gretta Cool, BSN, RN, CWS, Kim RN, BSN Entered By: Gretta Cool, BSN, RN, CWS, Kim on 10/22/2017 08:17:33 Jose Stone ([id number]) -------------------------------------------------------------------------------- Patient/Caregiver Education Details Patient Name: Jose Stone Date of Service: 10/22/2017 8:30 AM Medical Record Number: [id number] Patient Account Number: [id number] Date of Birth/Gender: 18-Jun-1946 (71 y.o. Male) Treating RN: Cornell Barman Primary Care Physician: Kathyrn Lass Other Clinician: Referring Physician: Referral, Self Treating Physician/Extender: Cathie Olden in Treatment: 0 Education Assessment Education Provided To: Patient Education Topics Provided Welcome To [facility] [place]: Handouts: Welcome To [facility] [place] Methods: Explain/Verbal Responses: State content correctly Wound/Skin Impairment: Handouts: Caring for Your Ulcer Methods: Demonstration, Explain/Verbal Responses: State content correctly Electronic Signature(s) Signed: 10/22/2017 9:07:13 AM By: Gretta Cool, BSN, RN, CWS, Kim RN, BSN Entered By: Gretta Cool, BSN, RN, CWS, Kim on 10/22/2017 [place], Humphrey ([id number]) -------------------------------------------------------------------------------- Wound Assessment Details Patient Name: Jose Stone Date of Service: 10/22/2017 8:30 AM Medical Record Number: [id number] Patient Account Number: [id number] Date of Birth/Sex: [birth year] (71 y.o. Male) Treating RN: Cornell Barman Primary Care Aquila Menzie: Kathyrn Lass Other  Clinician: Referring Anamae Rochelle: Referral, Self Treating Clydine Parkison/Extender: Cathie Olden in Treatment: 0 Wound Status Wound Number: 1 Primary Pressure Ulcer Etiology: Wound  Location: Left Toe Great Wound Open Wounding Event: Pressure Injury Status: Date Acquired: 09/22/2017 Comorbid Cataracts, Angina, Arrhythmia, Coronary Artery Weeks Of Treatment: 0 History: Disease, Hypertension, Myocardial Infarction, Clustered Wound: No History of pressure wounds, Gout Photos Wound Measurements Length: (cm) 2.4 Width: (cm) 3.7 Depth: (cm) 0.1 Area: (cm) 6.974 Volume: (cm) 0.697 % Reduction in Area: 0% % Reduction in Volume: 0% Tunneling: No Undermining: No Wound Description Classification: Unstageable/Unclassified Wound Margin: Indistinct, nonvisible Exudate Amount: None Present Foul Odor After Cleansing: No Wound Bed Granulation Amount: None Present (0%) Exposed Structure Necrotic Amount: None Present (0%) Fascia Exposed: No Fat Layer (Subcutaneous Tissue) Exposed: No Tendon Exposed: No Muscle Exposed: No Joint Exposed: No Bone Exposed: No Limited to Skin Breakdown Periwound Skin Texture Texture Color No Abnormalities Noted: No No Abnormalities Noted: No Ecchymosis: Yes Moisture No Abnormalities Noted: No GREY, RAKESTRAW ([id number]) Wound Preparation Ulcer Cleansing: Rinsed/Irrigated with Saline Topical Anesthetic Applied: Other: idocaine 4%, Treatment Notes Wound #1 (Left Toe Great) 1. Cleansed with: Clean wound with Normal Saline 2. Anesthetic Topical Lidocaine 4% cream to wound bed prior to debridement 4. Dressing Applied: Other dressing (specify in notes) 5. Secondary Dressing Applied Kerlix/Conform 6. Footwear/Offloading device applied Wedge shoe Notes [employer] Electronic Signature(s) Signed: 10/22/2017 5:08:53 PM By: Gretta Cool, BSN, RN, CWS, Kim RN, BSN Previous Signature: 10/22/2017 9:07:13 AM Version By: Gretta Cool, BSN, RN, CWS, Kim RN, BSN Entered By: Gretta Cool, BSN, RN, CWS, Kim on 10/22/2017 09:18:19 Jose Stone ([id number]) -------------------------------------------------------------------------------- [place]  Details Patient Name: Jose Stone Date of Service: 10/22/2017 8:30 AM Medical Record Number: [id number] Patient Account Number: [id number] Date of Birth/Sex: 11-21-1945 (71 y.o. Male) Treating RN: Cornell Barman Primary Care Namita Yearwood: Kathyrn Lass Other Clinician: Referring Phi Avans: Referral, Self Treating Nikhil Osei/Extender: Cathie Olden in Treatment: 0 Vital Signs Time Taken: 08:17 Temperature (F): 98.2 Height (in): 73 Pulse (bpm): 75 Weight (lbs): 236 Respiratory Rate (breaths/min): 16 Body Mass Index (BMI): 31.1 Blood Pressure (mmHg): 137/85 Reference Range: 80 - 120 mg / dl Electronic Signature(s) Signed: 10/22/2017 9:07:13 AM By: Gretta Cool, BSN, RN, CWS, Kim RN, BSN Entered By: Gretta Cool, BSN, RN, CWS, Kim on 10/22/2017 08:18:02

## 2017-10-28 ENCOUNTER — Other Ambulatory Visit: Payer: Self-pay

## 2017-10-28 MED ORDER — RIVAROXABAN 20 MG PO TABS: ORAL_TABLET | ORAL | 1 refills | Status: DC

## 2017-10-29 ENCOUNTER — Encounter: Payer: Medicare Other | Admitting: Surgery

## 2017-10-29 DIAGNOSIS — L84 Corns and callosities: Secondary | ICD-10-CM | POA: Diagnosis not present

## 2017-10-29 DIAGNOSIS — I252 Old myocardial infarction: Secondary | ICD-10-CM | POA: Diagnosis not present

## 2017-10-29 DIAGNOSIS — Z87891 Personal history of nicotine dependence: Secondary | ICD-10-CM | POA: Diagnosis not present

## 2017-10-29 DIAGNOSIS — S90422A Blister (nonthermal), left great toe, initial encounter: Secondary | ICD-10-CM | POA: Diagnosis not present

## 2017-10-29 DIAGNOSIS — I2510 Atherosclerotic heart disease of native coronary artery without angina pectoris: Secondary | ICD-10-CM | POA: Diagnosis not present

## 2017-10-29 DIAGNOSIS — L97529 Non-pressure chronic ulcer of other part of left foot with unspecified severity: Secondary | ICD-10-CM | POA: Diagnosis not present

## 2017-10-29 DIAGNOSIS — L8992 Pressure ulcer of unspecified site, stage 2: Secondary | ICD-10-CM | POA: Diagnosis not present

## 2017-10-29 NOTE — Progress Notes (Addendum)
Jose Stone ([id number]) Visit Report for 10/29/2017 Chief Complaint Document Details Patient Name: Jose Stone, Jose Stone Date of Service: 10/29/2017 8:30 AM Medical Record Number: [id number] Patient Account Number: [id number] Date of Birth/Sex: 1946-06-25 (71 y.o. Male) Treating RN: Cornell Barman Primary Care Provider: Kathyrn Lass Other Clinician: Referring Provider: Kathyrn Lass Treating Provider/Extender: Frann Rider in Treatment: 1 Information Obtained from: Patient Chief Complaint He is here for initial evaluation of a left great toe blister Electronic Signature(s) Signed: 10/29/2017 8:25:55 AM By: Christin Fudge MD, FACS Entered By: Christin Fudge on 10/29/2017 08:25:55 Jose Stone ([id number]) -------------------------------------------------------------------------------- HPI Details Patient Name: Jose Stone Date of Service: 10/29/2017 8:30 AM Medical Record Number: [id number] Patient Account Number: [id number] Date of Birth/Sex: [birth year] (71 y.o. Male) Treating RN: Cornell Barman Primary Care Provider: Kathyrn Lass Other Clinician: Referring Provider: Kathyrn Lass Treating Provider/Extender: Frann Rider in Treatment: 1 History of Present Illness Location: Patient presents with an ulcer on the left Great toe Quality: no pain Severity: no pain Duration: has been present for approximately one month Context: blister/wound occurs with recurrent friction and pressure HPI Description: 10/22/17- he is here for initial evaluation of the left great toe blister. He was recently seen at [facility] wound care center, discharge on 10/18 for ulceration in the same place. He is nondiabetic. He has not neuropathic. During his outpatient treatment he was given a Darco front offloading shoe, was not prescribed orthotic evaluation for shoe insert. I think he would most benefit long-term from an orthotic insert. He states that approximately 1 month after discharge she noticed a formation  of callus and developed significant blister after walking in work boots last week. He denies fever, chills, overall ill feeling. He denies pain. Electronic Signature(s) Signed: 10/29/2017 8:26:22 AM By: Christin Fudge MD, FACS Entered By: Christin Fudge on 10/29/2017 28:36:62 Jose Stone ([id number]) -------------------------------------------------------------------------------- Physical Exam Details Patient Name: Jose Stone Date of Service: 10/29/2017 8:30 AM Medical Record Number: [id number] Patient Account Number: [id number] Date of Birth/Sex: 1946/06/01 (71 y.o. Male) Treating RN: Cornell Barman Primary Care Provider: Kathyrn Lass Other Clinician: Referring Provider: Kathyrn Lass Treating Provider/Extender: Frann Rider in Treatment: 1 Constitutional . Pulse regular. Respirations normal and unlabored. Afebrile. . Eyes Nonicteric. Reactive to light. Ears, Nose, Mouth, and Throat Lips, teeth, and gums WNL.Marland Kitchen Moist mucosa without lesions. Neck supple and nontender. No palpable supraclavicular or cervical adenopathy. Normal sized without goiter. Respiratory WNL. No retractions.. Cardiovascular Pedal Pulses WNL. No clubbing, cyanosis or edema. Lymphatic No adneopathy. No adenopathy. No adenopathy. Musculoskeletal Adexa without tenderness or enlargement.. Digits and nails w/o clubbing, cyanosis, infection, petechiae, ischemia, or inflammatory conditions.. Integumentary (Hair, Skin) No suspicious lesions. No crepitus or fluctuance. No peri-wound warmth or erythema. No masses.Marland Kitchen Psychiatric Judgement and insight Intact.. No evidence of depression, anxiety, or agitation.. Notes the left big toe ulceration has completely healed and the area of the callus is supple and there are no open wounds. Electronic Signature(s) Signed: 10/29/2017 8:26:59 AM By: Christin Fudge MD, FACS Entered By: Christin Fudge on 10/29/2017 08:26:59 Jose Stone  ([id number]) -------------------------------------------------------------------------------- Physician Orders Details Patient Name: Jose Stone Date of Service: 10/29/2017 8:30 AM Medical Record Number: [id number] Patient Account Number: [id number] Date of Birth/Sex: 1946-04-01 (71 y.o. Male) Treating RN: Cornell Barman Primary Care Provider: Kathyrn Lass Other Clinician: Referring Provider: Kathyrn Lass Treating Provider/Extender: Frann Rider in Treatment: 1 Verbal / Phone Orders: No Diagnosis Coding Discharge From [facility] o Discharge from [place] Signature(s) Signed: 10/29/2017 10:11:01 AM By: Gretta Cool, BSN, RN,  CWS, Romie Minus, BSN Signed: 10/29/2017 5:09:21 PM By: Christin Fudge MD, FACS Entered By: Gretta Cool, BSN, RN, CWS, Kim on 10/29/2017 08:31:27 Jose Stone ([id number]) -------------------------------------------------------------------------------- Problem List Details Patient Name: Jose Stone Date of Service: 10/29/2017 8:30 AM Medical Record Number: [id number] Patient Account Number: [id number] Date of Birth/Sex: Aug 08, 1946 (71 y.o. Male) Treating RN: Cornell Barman Primary Care Provider: Kathyrn Lass Other Clinician: Referring Provider: Kathyrn Lass Treating Provider/Extender: Frann Rider in Treatment: 1 Active Problems ICD-10 Encounter Code Description Active Date Diagnosis L84 Corns and callosities 10/22/2017 Yes S90.422A Blister (nonthermal), left great toe, initial encounter 10/22/2017 Yes L89.92 Pressure ulcer of unspecified site, stage 2 10/22/2017 Yes Inactive Problems Resolved Problems Electronic Signature(s) Signed: 10/29/2017 8:25:04 AM By: Christin Fudge MD, FACS Entered By: Christin Fudge on 10/29/2017 08:25:04 Jose Stone ([id number]) -------------------------------------------------------------------------------- Progress Note Details Patient Name: Jose Stone Date of Service: 10/29/2017 8:30 AM Medical Record  Number: [id number] Patient Account Number: [id number] Date of Birth/Sex: Mar 01, 1946 (71 y.o. Male) Treating RN: Cornell Barman Primary Care Provider: Kathyrn Lass Other Clinician: Referring Provider: Kathyrn Lass Treating Provider/Extender: Frann Rider in Treatment: 1 Subjective Chief Complaint Information obtained from Patient He is here for initial evaluation of a left great toe blister History of Present Illness (HPI) The following HPI elements were documented for the patient's wound: Location: Patient presents with an ulcer on the left Great toe Quality: no pain Severity: no pain Duration: has been present for approximately one month Context: blister/wound occurs with recurrent friction and pressure 10/22/17- he is here for initial evaluation of the left great toe blister. He was recently seen at [facility] wound care center, discharge on 10/18 for ulceration in the same place. He is nondiabetic. He has not neuropathic. During his outpatient treatment he was given a Darco front offloading shoe, was not prescribed orthotic evaluation for shoe insert. I think he would most benefit long-term from an orthotic insert. He states that approximately 1 month after discharge she noticed a formation of callus and developed significant blister after walking in work boots last week. He denies fever, chills, overall ill feeling. He denies pain. Patient History Information obtained from Patient. Family History Cancer - Mother, Heart Disease - Mother, Hypertension - Mother,Siblings, Stroke - Father, No family history of Diabetes, Kidney Disease, Lung Disease, Seizures, Thyroid Problems, Tuberculosis. Social History Former smoker, Marital Status - Married, Alcohol Use - Never, Drug Use - No History, Caffeine Use - Never. 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  Respirations normal and unlabored. Afebrile. Vitals Time Taken: 8:08 AM, Height: 73 in, Weight: 236 lbs, BMI: 31.1, Temperature: 98.3 F, Pulse: 60 bpm, Respiratory Rate: 16 breaths/min, Blood Pressure: 138/69 mmHg. Eyes Nonicteric. Reactive to light. Ears, Nose, Mouth, and Throat Lips, teeth, and gums WNL.Marland Kitchen Moist mucosa without lesions. Neck supple and nontender. No palpable supraclavicular or cervical adenopathy. Normal sized without goiter. Respiratory WNL. No retractions.. Cardiovascular Pedal Pulses WNL. No clubbing, cyanosis or edema. Lymphatic No adneopathy. No adenopathy. No adenopathy. Musculoskeletal Adexa without tenderness or enlargement.. Digits and nails w/o clubbing, cyanosis, infection, petechiae, ischemia, or inflammatory conditions.Marland Kitchen Psychiatric Judgement and insight Intact.. No evidence of depression, anxiety, or agitation.. General Notes: the left big toe ulceration has completely healed and the area of the callus is supple and there are no open wounds. Integumentary (Hair, Skin) No suspicious lesions. No crepitus or fluctuance. No peri-wound warmth or erythema. No masses.. Wound #1 status is Open.  Original cause of wound was Pressure Injury. The wound is located on the Left Toe Great. The wound measures 0.3cm length x 0.3cm width x 0.1cm depth; 0.071cm^2 area and 0.007cm^3 volume. Assessment Active Problems ICD-10 L84 - Corns and callosities S90.422A - Blister (nonthermal), left great toe, initial encounter L89.92 - Pressure ulcer of unspecified site, stage 2 ALDEAN, PIPE ([id number]) Plan Discharge From [facility] Services: Discharge from [place] the patient has done very well over the last week after offloading and having the callus removed. There are no open wounds and I have recommended a bordered foam to be placed over the left big toe and to continue to offload with a Darco front offloading shoe until he gets his podiatry appointment which is  coming up soon. He is discharged from the wound care services and will be seen back only if the need arises. Electronic Signature(s) Signed: 10/29/2017 5:00:34 PM By: Christin Fudge MD, FACS Previous Signature: 10/29/2017 8:28:00 AM Version By: Christin Fudge MD, FACS Entered By: Christin Fudge on 10/29/2017 17:00:33 Jose Stone ([id number]) -------------------------------------------------------------------------------- ROS/PFSH Details Patient Name: Jose Stone Date of Service: 10/29/2017 8:30 AM Medical Record Number: [id number] Patient Account Number: [id number] Date of Birth/Sex: January 17, 1946 (71 y.o. Male) Treating RN: Cornell Barman Primary Care Provider: Kathyrn Lass Other Clinician: Referring Provider: Kathyrn Lass Treating Provider/Extender: Frann Rider in Treatment: 1 Information Obtained From Patient Wound History Do you currently have one or more open woundso Yes How many open wounds do you currently haveo 1 Approximately how long have you had your woundso 1 month How have you been treating your wound(s) until nowo vaseline Has your wound(s) ever healed and then re-openedo Yes Have you had any lab work done in the past montho No Have you tested positive for an antibiotic resistant organism (MRSA, VRE)o No Have you tested positive for osteomyelitis (bone infection)o No Have you had any tests for circulation on your legso No Constitutional Symptoms (General Health) Medical History: Past Medical History Notes: Gout, VSD, A-fib, MI 21; Eyes Medical History: Positive for: Cataracts - mild Negative for: Glaucoma; Optic Neuritis Ear/Nose/Mouth/Throat Medical History: Negative for: Chronic sinus problems/congestion; Middle ear problems Hematologic/Lymphatic Medical History: Negative for: Anemia; Hemophilia; Human Immunodeficiency Virus; Lymphedema; Sickle Cell Disease Respiratory Medical History: Negative for: Aspiration; Asthma; Chronic Obstructive Pulmonary  Disease (COPD); Pneumothorax; Sleep Apnea Cardiovascular Medical History: Positive for: Angina; Arrhythmia; Coronary Artery Disease; Hypertension; Myocardial Infarction - 2015 Negative for: Congestive Heart Failure; Deep Vein Thrombosis; Hypotension; Peripheral Arterial Disease; Peripheral Venous Disease; Phlebitis; Vasculitis Gastrointestinal DAMARRI, RAMPY ([id number]) Medical History: Negative for: Cirrhosis ; Colitis; Crohnos; Hepatitis A; Hepatitis B; Hepatitis C Endocrine Medical History: Negative for: Type I Diabetes; Type II Diabetes Genitourinary Medical History: Negative for: End Stage Renal Disease Immunological Medical History: Negative for: Lupus Erythematosus; Raynaudos; Scleroderma Integumentary (Skin) Medical History: Positive for: History of pressure wounds Negative for: History of Burn Musculoskeletal Medical History: Positive for: Gout Negative for: Rheumatoid Arthritis; Osteoarthritis; Osteomyelitis Neurologic Medical History: Negative for: Dementia; Neuropathy; Quadriplegia; Paraplegia; Seizure Disorder Oncologic Medical History: Negative for: Received Chemotherapy; Received Radiation Psychiatric Medical History: Negative for: Anorexia/bulimia; Confinement Anxiety HBO Extended History Items Eyes: Cataracts Immunizations Pneumococcal Vaccine: Received Pneumococcal Vaccination: Yes Implantable Devices Family and Social History Cancer: Yes - Mother; Diabetes: No; Heart Disease: Yes - Mother; Hypertension: Yes - Mother,Siblings; Kidney Disease: No; Lung Disease: No; Seizures: No; Stroke: Yes - Father; Thyroid Problems: No; Tuberculosis: No; Former smoker; Marital Status - Married; Alcohol Use: Never; Drug Use: No History; Caffeine Use:  Never; Financial Concerns: No; Food, Clothing or KAEMON, BARNETT ([id number]) Shelter Needs: No; Support System Lacking: No; Transportation Concerns: No; Advanced Directives: Yes (Not Provided); Patient does not want  information on Advanced Directives; Living Will: Yes (Not Provided); Medical Power of Attorney: Yes (Not Provided) Electronic Signature(s) Signed: 10/29/2017 10:11:01 AM By: Gretta Cool, BSN, RN, CWS, Kim RN, BSN Signed: 10/29/2017 5:09:21 PM By: Christin Fudge MD, FACS Entered By: Christin Fudge on 10/29/2017 08:26:29 SHERRILL, MCKAMIE ([id number]) -------------------------------------------------------------------------------- [place] Details Patient Name: Jose Stone Date of Service: 10/29/2017 Medical Record Number: [id number] Patient Account Number: [id number] Date of Birth/Sex: 07/15/1946 (71 y.o. Male) Treating RN: Cornell Barman Primary Care Provider: Kathyrn Lass Other Clinician: Referring Provider: Kathyrn Lass Treating Provider/Extender: Frann Rider in Treatment: 1 Diagnosis Coding ICD-10 Codes Code Description L84 Corns and callosities S90.422A Blister (nonthermal), left great toe, initial encounter L89.92 Pressure ulcer of unspecified site, stage 2 Facility Procedures CPT4 Code: [id number] Description: 63845 - WOUND CARE VISIT-LEV 2 EST PT Modifier: Quantity: 1 Physician Procedures CPT4 Code: 3646803 Description: 21224 - WC PHYS LEVEL 3 - EST PT ICD-10 Diagnosis Description L84 Corns and callosities S90.422A Blister (nonthermal), left great toe, initial encounter L89.92 Pressure ulcer of unspecified site, stage 2 Modifier: Quantity: 1 Electronic Signature(s) Signed: 10/29/2017 8:28:13 AM By: Christin Fudge MD, FACS Entered By: Christin Fudge on 10/29/2017 82:50:03

## 2017-10-30 DIAGNOSIS — N400 Enlarged prostate without lower urinary tract symptoms: Secondary | ICD-10-CM | POA: Diagnosis not present

## 2017-10-30 DIAGNOSIS — E291 Testicular hypofunction: Secondary | ICD-10-CM | POA: Diagnosis not present

## 2017-10-31 NOTE — Progress Notes (Addendum)
KEVIS, QU ([id number]) Visit Report for 10/29/2017 Arrival Information Details Patient Name: JAMELLE, GOLDSTON Date of Service: 10/29/2017 8:30 AM Medical Record Number: [id number] Patient Account Number: [id number] Date of Birth/Sex: [birth year] (71 y.o. Male) Treating RN: Roger Shelter Primary Care Israella Hubert: Kathyrn Lass Other Clinician: Referring Paiton Boultinghouse: Kathyrn Lass Treating Kayman Snuffer/Extender: Frann Rider in Treatment: 1 Visit Information History Since Last Visit All ordered tests and consults were completed: No Patient Arrived: Ambulatory Added or deleted any medications: No Arrival Time: 08:07 Any new allergies or adverse reactions: No Accompanied By: self Had a fall or experienced change in No Transfer Assistance: None activities of daily living that may affect Patient Requires Transmission-Based No risk of falls: Precautions: Signs or symptoms of abuse/neglect since last visito No Patient Has Alerts: Yes Hospitalized since last visit: No Patient Alerts: Patient on Blood Pain Present Now: No Thinner xalerto, 81mg  aspirin NOT DIABETIC Electronic Signature(s) Signed: 10/30/2017 2:36:38 PM By: Roger Shelter Entered By: Roger Shelter on 10/29/2017 08:08:05 [name] ([id number]) -------------------------------------------------------------------------------- Clinic Level of Care Assessment Details Patient Name: [name] Date of Service: 10/29/2017 8:30 AM Medical Record Number: [id number] Patient Account Number: [id number] Date of Birth/Sex: 09-07-1946 (71 y.o. Male) Treating RN: Cornell Barman Primary Care Eulia Hatcher: Kathyrn Lass Other Clinician: Referring Haliegh Khurana: Kathyrn Lass Treating Tamora Huneke/Extender: Frann Rider in Treatment: 1 Clinic Level of Care Assessment Items TOOL 4 Quantity Score []  - Use when only an EandM is performed on FOLLOW-UP visit 0 ASSESSMENTS - Nursing Assessment / Reassessment []  - Reassessment of Co-morbidities  (includes updates in patient status) 0 X- 1 5 Reassessment of Adherence to Treatment Plan ASSESSMENTS - Wound and Skin Assessment / Reassessment X - Simple Wound Assessment / Reassessment - one wound 1 5 []  - 0 Complex Wound Assessment / Reassessment - multiple wounds []  - 0 Dermatologic / Skin Assessment (not related to wound area) ASSESSMENTS - Focused Assessment []  - Circumferential Edema Measurements - multi extremities 0 []  - 0 Nutritional Assessment / Counseling / Intervention []  - 0 Lower Extremity Assessment (monofilament, tuning fork, pulses) []  - 0 Peripheral Arterial Disease Assessment (using hand held doppler) ASSESSMENTS - Ostomy and/or Continence Assessment and Care []  - Incontinence Assessment and Management 0 []  - 0 Ostomy Care Assessment and Management (repouching, etc.) PROCESS - Coordination of Care X - Simple Patient / Family Education for ongoing care 1 15 []  - 0 Complex (extensive) Patient / Family Education for ongoing care []  - 0 Staff obtains Programmer, systems, Records, Test Results / Process Orders []  - 0 Staff telephones HHA, Nursing Homes / Clarify orders / etc []  - 0 Routine Transfer to another Facility (non-emergent condition) []  - 0 Routine Hospital Admission (non-emergent condition) []  - 0 New Admissions / Biomedical engineer / Ordering NPWT, Apligraf, etc. []  - 0 Emergency Hospital Admission (emergent condition) X- 1 10 Simple Discharge Coordination KIEL, COCKERELL ([id number]) []  - 0 Complex (extensive) Discharge Coordination PROCESS - Special Needs []  - Pediatric / Minor Patient Management 0 []  - 0 Isolation Patient Management []  - 0 Hearing / Language / Visual special needs []  - 0 Assessment of Community assistance (transportation, D/C planning, etc.) []  - 0 Additional assistance / Altered mentation []  - 0 Support Surface(s) Assessment (bed, cushion, seat, etc.) INTERVENTIONS - Wound Cleansing / Measurement X - Simple Wound Cleansing  - one wound 1 5 []  - 0 Complex Wound Cleansing - multiple wounds X- 1 5 Wound Imaging (photographs - any number of wounds) []  - 0 Wound Tracing (instead of photographs) X-  1 5 Simple Wound Measurement - one wound []  - 0 Complex Wound Measurement - multiple wounds INTERVENTIONS - Wound Dressings []  - Small Wound Dressing one or multiple wounds 0 []  - 0 Medium Wound Dressing one or multiple wounds []  - 0 Large Wound Dressing one or multiple wounds []  - 0 Application of Medications - topical []  - 0 Application of Medications - injection INTERVENTIONS - Miscellaneous []  - External ear exam 0 []  - 0 Specimen Collection (cultures, biopsies, blood, body fluids, etc.) []  - 0 Specimen(s) / Culture(s) sent or taken to Lab for analysis []  - 0 Patient Transfer (multiple staff / Civil Service fast streamer / Similar devices) []  - 0 Simple Staple / Suture removal (25 or less) []  - 0 Complex Staple / Suture removal (26 or more) []  - 0 Hypo / Hyperglycemic Management (close monitor of Blood Glucose) []  - 0 Ankle / Brachial Index (ABI) - do not check if billed separately X- 1 5 Vital Signs Mccallum, Aceson ([id number]) Has the patient been seen at the hospital within the last three years: Yes Total Score: 55 Level Of Care: New/Established - Level 2 Electronic Signature(s) Signed: 10/29/2017 10:11:01 AM By: Gretta Cool, BSN, RN, CWS, Kim RN, BSN Entered By: Gretta Cool, BSN, RN, CWS, Kim on 10/29/2017 08:27:18 [name] ([id number]) -------------------------------------------------------------------------------- Encounter Discharge Information Details Patient Name: [name] Date of Service: 10/29/2017 8:30 AM Medical Record Number: [id number] Patient Account Number: [id number] Date of Birth/Sex: 19-Mar-1946 (71 y.o. Male) Treating RN: Cornell Barman Primary Care Nickolas Chalfin: Kathyrn Lass Other Clinician: Referring Marnita Poirier: Kathyrn Lass Treating Eleen Litz/Extender: Frann Rider in Treatment: 1 Encounter  Discharge Information Items Discharge Pain Level: 1 Discharge Condition: Stable Ambulatory Status: Ambulatory Discharge Destination: Home Transportation: Private Auto Accompanied By: self Schedule Follow-up Appointment: Yes Medication Reconciliation completed and Yes provided to Patient/Care Breyden Jeudy: Provided on Clinical Summary of Care: 10/29/2017 Form Type Recipient Paper Patient TB Electronic Signature(s) Signed: 10/29/2017 10:11:01 AM By: Gretta Cool, BSN, RN, CWS, Kim RN, BSN Entered By: Gretta Cool, BSN, RN, CWS, Kim on 10/29/2017 08:32:22 [name] ([id number]) -------------------------------------------------------------------------------- Lower Extremity Assessment Details Patient Name: [name] Date of Service: 10/29/2017 8:30 AM Medical Record Number: [id number] Patient Account Number: [id number] Date of Birth/Sex: 04-24-1946 (71 y.o. Male) Treating RN: Cornell Barman Primary Care Yuriko Portales: Kathyrn Lass Other Clinician: Referring Tarvaris Puglia: Kathyrn Lass Treating Citlally Captain/Extender: Frann Rider in Treatment: 1 Edema Assessment Assessed: [Left: No] [Right: No] Edema: [Left: N] [Right: o] Vascular Assessment Pulses: Dorsalis Pedis Palpable: [Left:Yes] Posterior Tibial Extremity colors, hair growth, and conditions: Extremity Color: [Left:Normal] Hair Growth on Extremity: [Left:No] Temperature of Extremity: [Left:Warm] Capillary Refill: [Left:< 3 seconds] Toe Nail Assessment Left: Right: Thick: No Discolored: No Deformed: No Improper Length and Hygiene: No Electronic Signature(s) Signed: 10/29/2017 10:11:01 AM By: Gretta Cool, BSN, RN, CWS, Kim RN, BSN Entered By: Gretta Cool, BSN, RN, CWS, Kim on 10/29/2017 08:15:38 [name] ([id number]) -------------------------------------------------------------------------------- Multi Wound Chart Details Patient Name: [name] Date of Service: 10/29/2017 8:30 AM Medical Record Number: [id number] Patient Account Number:  [id number] Date of Birth/Sex: January 08, 1946 (71 y.o. Male) Treating RN: Cornell Barman Primary Care Zacari Radick: Kathyrn Lass Other Clinician: Referring Prestin Munch: Kathyrn Lass Treating Berma Harts/Extender: Frann Rider in Treatment: 1 Vital Signs Height(in): 73 Pulse(bpm): 60 Weight(lbs): 236 Blood Pressure(mmHg): 138/69 Body Mass Index(BMI): 31 Temperature(F): 98.3 Respiratory Rate 16 (breaths/min): Photos: [1:No Photos] [N/A:N/A] Wound Location: [1:Left Toe Great] [N/A:N/A] Wounding Event: [1:Pressure Injury] [N/A:N/A] Primary Etiology: [1:Pressure Ulcer] [N/A:N/A] Date Acquired: [1:09/22/2017] [N/A:N/A] Weeks of Treatment: [1:1] [N/A:N/A] Wound Status: [1:Open] [N/A:N/A] Measurements  L x W x D [1:0.3x0.3x0.1] [N/A:N/A] (cm) Area (cm) : [1:0.071] [N/A:N/A] Volume (cm) : [1:0.007] [N/A:N/A] % Reduction in Area: [1:99.00%] [N/A:N/A] % Reduction in Volume: [1:99.00%] [N/A:N/A] Classification: [1:Unstageable/Unclassified] [N/A:N/A] Periwound Skin Texture: [1:No Abnormalities Noted] [N/A:N/A] Periwound Skin Moisture: [1:No Abnormalities Noted] [N/A:N/A] Periwound Skin Color: [1:No Abnormalities Noted No] [N/A:N/A N/A] Treatment Notes Electronic Signature(s) Signed: 10/29/2017 8:25:27 AM By: Christin Fudge MD, FACS Entered By: Christin Fudge on 10/29/2017 08:25:27 [name] ([id number]) -------------------------------------------------------------------------------- [place] Details Patient Name: [name] Date of Service: 10/29/2017 8:30 AM Medical Record Number: [id number] Patient Account Number: [id number] Date of Birth/Sex: [birth year] (71 y.o. Male) Treating RN: Cornell Barman Primary Care Brevyn Ring: Kathyrn Lass Other Clinician: Referring Cozy Veale: Kathyrn Lass Treating Xee Hollman/Extender: Frann Rider in Treatment: 1 Active Inactive Electronic Signature(s) Signed: 11/09/2017 11:48:53 AM By: Gretta Cool, BSN, RN, CWS, Kim RN, BSN Previous Signature:  10/29/2017 10:11:01 AM Version By: Gretta Cool, BSN, RN, CWS, Kim RN, BSN Entered By: Gretta Cool, BSN, RN, CWS, Kim on 11/09/2017 11:48:53 [name] ([id number]) -------------------------------------------------------------------------------- Pain Assessment Details Patient Name: [name] Date of Service: 10/29/2017 8:30 AM Medical Record Number: [id number] Patient Account Number: [id number] Date of Birth/Sex: 1946/02/05 (71 y.o. Male) Treating RN: Roger Shelter Primary Care Denora Wysocki: Kathyrn Lass Other Clinician: Referring Berlinda Farve: Kathyrn Lass Treating Dane Bloch/Extender: Frann Rider in Treatment: 1 Active Problems Location of Pain Severity and Description of Pain Patient Has Paino No Site Locations Pain Management and Medication Current Pain Management: Electronic Signature(s) Signed: 10/30/2017 2:36:38 PM By: Roger Shelter Entered By: Roger Shelter on 10/29/2017 08:08:12 [name] ([id number]) -------------------------------------------------------------------------------- Patient/Caregiver Education Details Patient Name: [name] Date of Service: 10/29/2017 8:30 AM Medical Record Number: [id number] Patient Account Number: [id number] Date of Birth/Gender: 03-Jan-1946 (71 y.o. Male) Treating RN: Cornell Barman Primary Care Physician: Kathyrn Lass Other Clinician: Referring Physician: Kathyrn Lass Treating Physician/Extender: Frann Rider in Treatment: 1 Education Assessment Education Provided To: Patient Education Topics Provided Offloading: Handouts: What is Offloadingo Methods: Explain/Verbal Responses: State content correctly Electronic Signature(s) Signed: 10/29/2017 10:11:01 AM By: Gretta Cool, BSN, RN, CWS, Kim RN, BSN Entered By: Gretta Cool, BSN, RN, CWS, Kim on 10/29/2017 08:32:34 [name] ([id number]) -------------------------------------------------------------------------------- Wound Assessment Details Patient Name: [name] Date of  Service: 10/29/2017 8:30 AM Medical Record Number: [id number] Patient Account Number: [id number] Date of Birth/Sex: 1946/07/04 (71 y.o. Male) Treating RN: Roger Shelter Primary Care Efraim Vanallen: Kathyrn Lass Other Clinician: Referring Maisee Vollman: Kathyrn Lass Treating Kennen Stammer/Extender: Frann Rider in Treatment: 1 Wound Status Wound Number: 1 Primary Etiology: Pressure Ulcer Wound Location: Left Toe Great Wound Status: Open Wounding Event: Pressure Injury Date Acquired: 09/22/2017 Weeks Of Treatment: 1 Clustered Wound: No Wound Measurements Length: (cm) 0.3 Width: (cm) 0.3 Depth: (cm) 0.1 Area: (cm) 0.071 Volume: (cm) 0.007 % Reduction in Area: 99% % Reduction in Volume: 99% Wound Description Classification: Unstageable/Unclassified Periwound Skin Texture Texture Color No Abnormalities Noted: No No Abnormalities Noted: No Moisture No Abnormalities Noted: No Electronic Signature(s) Signed: 10/30/2017 2:36:38 PM By: Roger Shelter Entered By: Roger Shelter on 10/29/2017 08:13:57 [name] ([id number]) -------------------------------------------------------------------------------- Vitals Details Patient Name: [name] Date of Service: 10/29/2017 8:30 AM Medical Record Number: [id number] Patient Account Number: [id number] Date of Birth/Sex: July 17, 1946 (71 y.o. Male) Treating RN: Roger Shelter Primary Care Kalib Bhagat: Kathyrn Lass Other Clinician: Referring Eulogio Requena: Kathyrn Lass Treating Ethridge Sollenberger/Extender: Frann Rider in Treatment: 1 Vital Signs Time Taken: 08:08 Temperature (F): 98.3 Height (in): 73 Pulse (bpm): 60 Weight (lbs): 236 Respiratory Rate (breaths/min): 16 Body Mass Index (BMI): 31.1 Blood Pressure (  mmHg): 138/69 Reference Range: 80 - 120 mg / dl Electronic Signature(s) Signed: 10/30/2017 2:36:38 PM By: Roger Shelter Entered By: Roger Shelter on 10/29/2017 44:51:46

## 2017-11-04 ENCOUNTER — Ambulatory Visit: Payer: Medicare Other | Admitting: Internal Medicine

## 2017-11-05 DIAGNOSIS — N400 Enlarged prostate without lower urinary tract symptoms: Secondary | ICD-10-CM | POA: Diagnosis not present

## 2017-11-09 ENCOUNTER — Encounter: Payer: Self-pay | Admitting: Podiatry

## 2017-11-09 ENCOUNTER — Ambulatory Visit (INDEPENDENT_AMBULATORY_CARE_PROVIDER_SITE_OTHER): Payer: Medicare Other | Admitting: Podiatry

## 2017-11-09 DIAGNOSIS — L84 Corns and callosities: Secondary | ICD-10-CM

## 2017-11-09 DIAGNOSIS — D689 Coagulation defect, unspecified: Secondary | ICD-10-CM | POA: Diagnosis not present

## 2017-11-09 DIAGNOSIS — M2142 Flat foot [pes planus] (acquired), left foot: Secondary | ICD-10-CM | POA: Diagnosis not present

## 2017-11-09 NOTE — Progress Notes (Signed)
Subjective: Jose Stone presents the office today for concerns of a callus to the left big toe.  He states that [place] and [place] had a callus to the area which came off and got infected.  He developed a wound to the area he was seen at the wound care center.  That eventually healed however it did recur and he recently was retreated the wound care center.  The wound was again healed but he has a callus over the area and dry skin.  He thinks that he needs to wear an insert because he has noticed on his inserts that he puts a lot more pressure on the left foot on the right side on the big toe.  He currently denies any drainage or pus and denies any swelling or redness. Denies any systemic complaints such as fevers, chills, nausea, vomiting. No acute changes since last appointment, and no other complaints at this time.   He is on Xarelto  Objective: AAO x3, NAD DP/PT pulses palpable bilaterally, CRT less than 3 seconds Decreased medial arch height upon weightbearing.  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  There is a hyperkeratotic lesion plantar left hallux on the IPJ.  Upon debridement I was able to remove some loose tissue but I do not completely remove all of the tissue as it appears that there is a scab, dry skin on the area I did not want to cause a wound to form.  There is no drainage or pus there is no surrounding erythema, ascending cellulitis.  There is no fluctuation or crepitation.  There is no malodor. No open lesions or other pre-ulcerative lesions.  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  -I was able to sharply debride some of the loose hyperkeratotic tissue to the plantar aspect the left hallux today without any complications or bleeding.  I want  to continue the small amount of moisturizer to the area daily.  Also continue the offloading shoe for now.  I do think she will benefit from a custom insert to help offload the foot help control his foot type as I think this is what is causing this callus and wound deformed.  Rick evaluated him today and he was molded for orthotics. -Follow-up in 3 weeks or sooner if needed. Monitor for any clinical signs or symptoms of infection and directed to call the office immediately should any occur or go to the ER. -Patient encouraged to call the office with any questions, concerns, change in symptoms.   Trula Slade DPM

## 2017-11-10 ENCOUNTER — Encounter (HOSPITAL_BASED_OUTPATIENT_CLINIC_OR_DEPARTMENT_OTHER): Payer: Medicare Other

## 2017-11-12 DIAGNOSIS — E291 Testicular hypofunction: Secondary | ICD-10-CM | POA: Diagnosis not present

## 2017-11-14 IMAGING — CR DG CHEST 2V
2 series · 2 of 2 positions shown · non-contrast
Comparison: 08/12/2014 chest radiograph.

CLINICAL DATA: Palpitations

EXAM:
CHEST  2 VIEW

[chest pa]
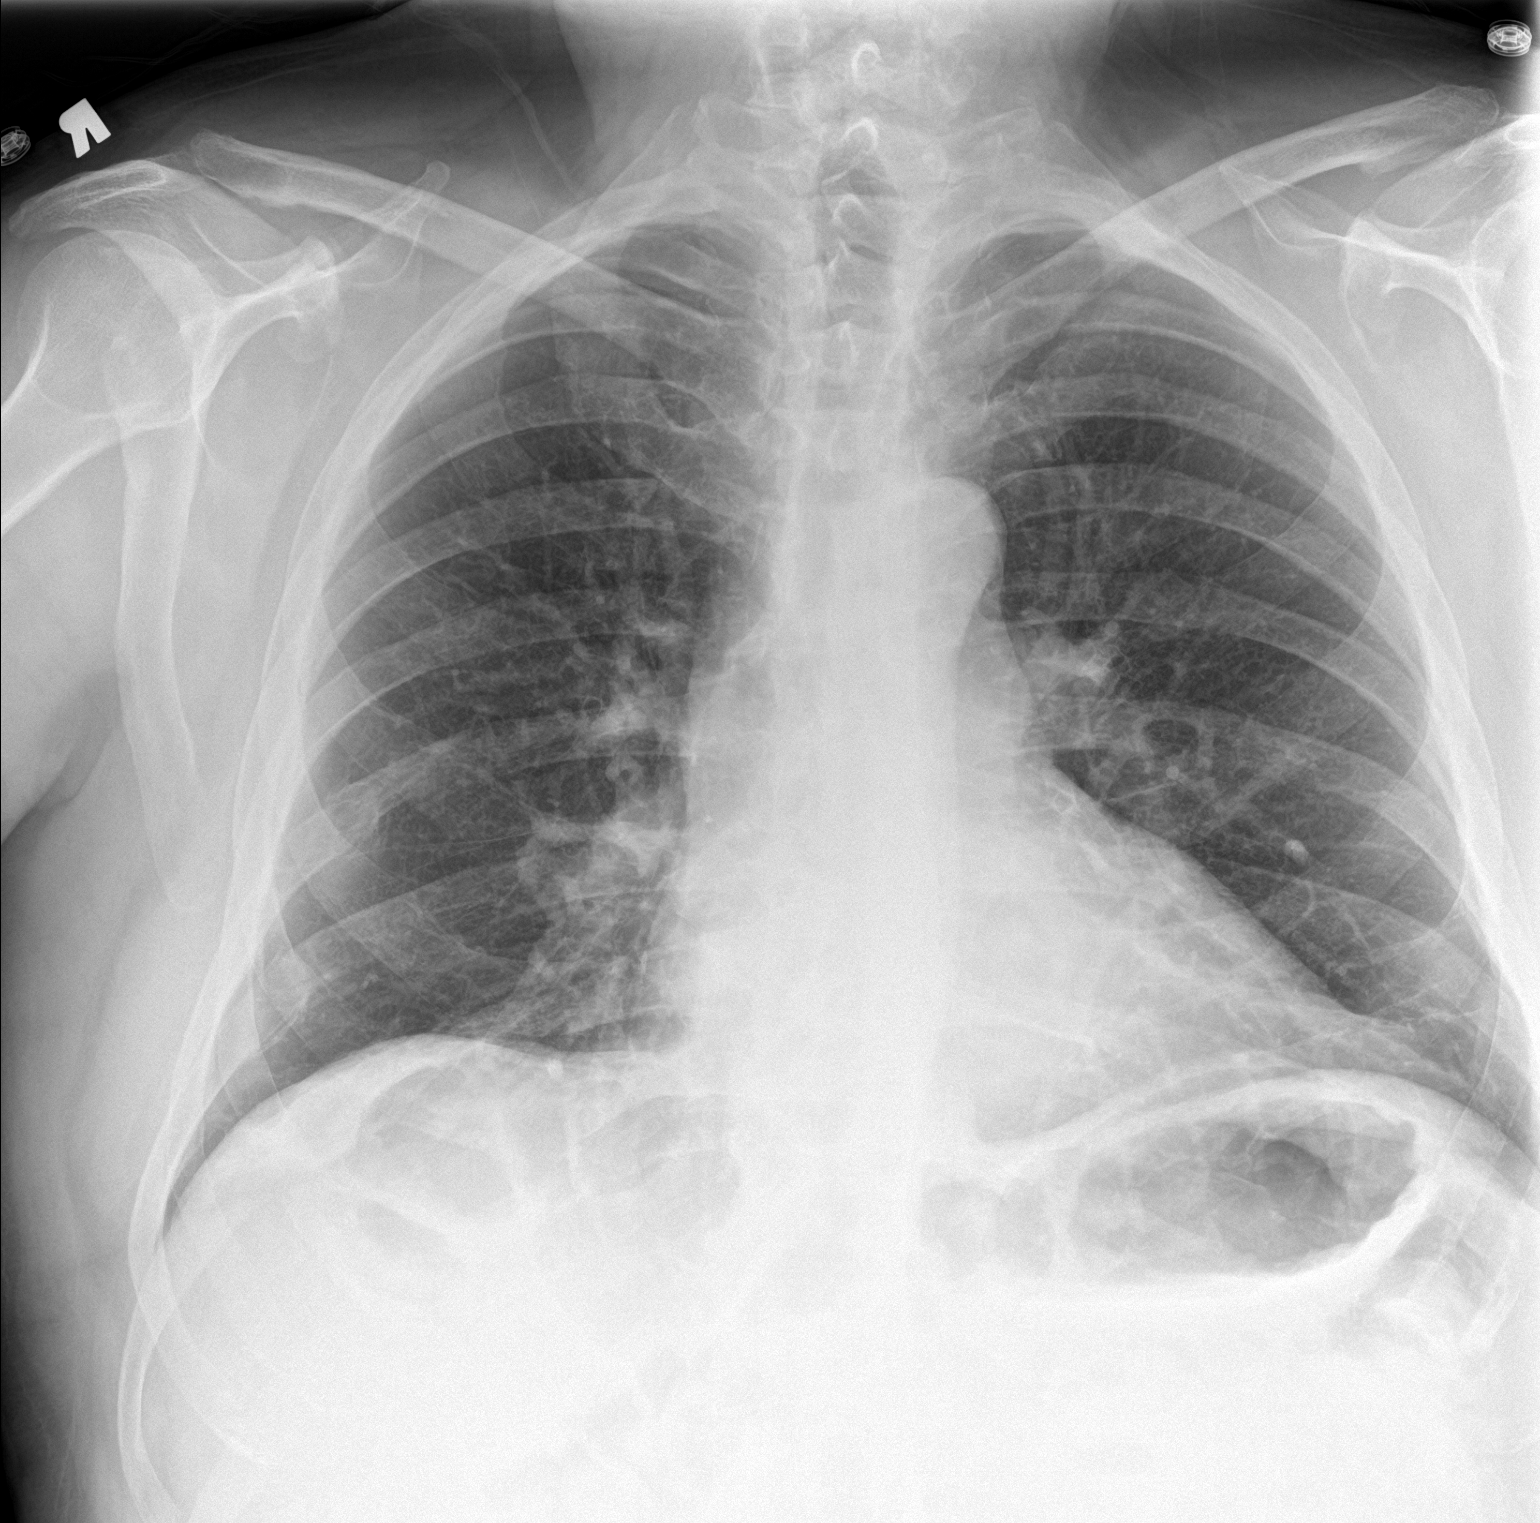

[chest lat]
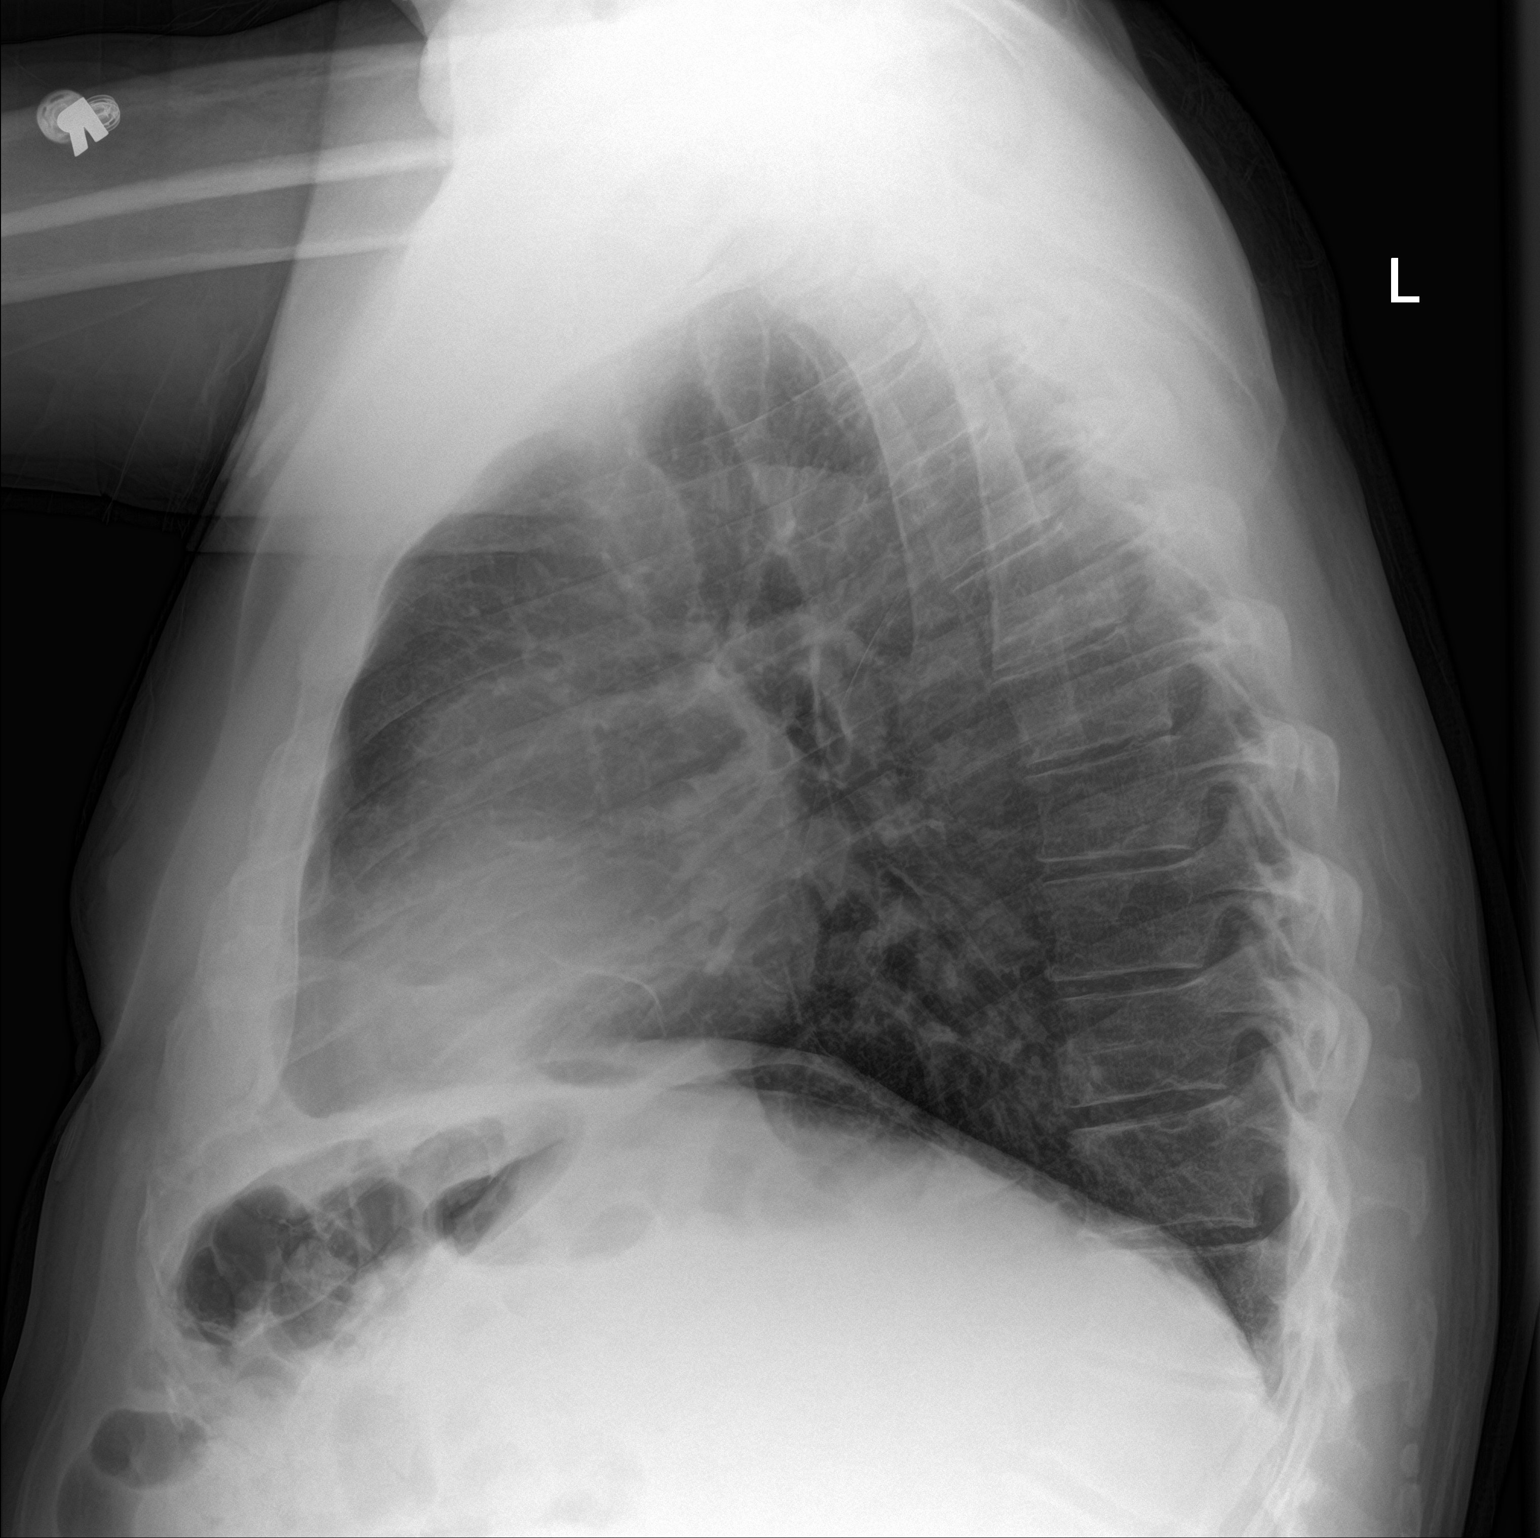

[2 of 2 positions shown; findings below may reference images not displayed]

FINDINGS: Stable cardiomediastinal silhouette with normal heart size. No
pneumothorax. No pleural effusion. No pulmonary edema. No acute
consolidative airspace disease. Mild scarring versus atelectasis at
the anterior lung bases. Mild deformities in the right posterior
eighth through tenth ribs appear chronic.
IMPRESSION: Mild scarring versus atelectasis at the anterior lung bases.
Otherwise no active disease in the chest.

## 2017-11-16 ENCOUNTER — Other Ambulatory Visit: Payer: Self-pay

## 2017-11-16 MED ORDER — ATORVASTATIN CALCIUM 80 MG PO TABS: 80.0000 mg | ORAL_TABLET | Freq: Every day | ORAL | 3 refills | Status: DC

## 2017-11-26 ENCOUNTER — Ambulatory Visit: Payer: Self-pay | Admitting: Podiatry

## 2017-11-30 ENCOUNTER — Other Ambulatory Visit: Payer: Medicare Other | Admitting: Orthotics

## 2017-11-30 ENCOUNTER — Ambulatory Visit: Payer: Medicare Other | Admitting: Podiatry

## 2017-12-01 DIAGNOSIS — R062 Wheezing: Secondary | ICD-10-CM | POA: Diagnosis not present

## 2017-12-01 DIAGNOSIS — R05 Cough: Secondary | ICD-10-CM | POA: Diagnosis not present

## 2017-12-01 DIAGNOSIS — J069 Acute upper respiratory infection, unspecified: Secondary | ICD-10-CM | POA: Diagnosis not present

## 2017-12-08 ENCOUNTER — Other Ambulatory Visit: Payer: Medicare Other | Admitting: Orthotics

## 2017-12-08 ENCOUNTER — Ambulatory Visit (INDEPENDENT_AMBULATORY_CARE_PROVIDER_SITE_OTHER): Payer: Medicare Other

## 2017-12-08 ENCOUNTER — Encounter: Payer: Self-pay | Admitting: Podiatry

## 2017-12-08 ENCOUNTER — Ambulatory Visit (INDEPENDENT_AMBULATORY_CARE_PROVIDER_SITE_OTHER): Payer: Medicare Other | Admitting: Podiatry

## 2017-12-08 DIAGNOSIS — L84 Corns and callosities: Secondary | ICD-10-CM

## 2017-12-08 DIAGNOSIS — M779 Enthesopathy, unspecified: Secondary | ICD-10-CM | POA: Diagnosis not present

## 2017-12-08 NOTE — Patient Instructions (Signed)

## 2017-12-09 NOTE — Progress Notes (Signed)
Subjective: Rakeen presents the office today for concerns of a new blister to the left toe which started about 3 days ago.  He states that he has been wearing the Darco shoe with a heel pressure off the toe which is been helping but he does go with a regular sock barefoot around the house at times.  He is not sure why the blister started again.  Denies any redness or drainage or any swelling.  He also presents today to pick up orthotics.  He has no other concerns today. Denies any systemic complaints such as fevers, chills, nausea, vomiting. No acute changes since last appointment, and no other complaints at this time.   Objective: AAO x3, NAD DP/PT pulses palpable bilaterally, CRT less than 3 seconds On the medial aspect the left hallux PIPJ is what appears to be a small fluid-filled blister.  I was able to puncture this and only clear blister fluid came out and there is no purulence.  There is no surrounding erythema, ascending cellulitis.  There is no fluctuation or crepitation.  There is no malodor.   Hallux is in an abductus position.  No open lesions or pre-ulcerative lesions.  No pain with calf compression, swelling, warmth, erythema  Assessment: Recurrent blister left foot; bone spur  Plan: -All treatment options discussed with the patient including all alternatives, risks, complications.  -X-rays were obtained and reviewed.  There is a bone spur present off the medial distal phalanx.  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 -I cleaned the blister with alcohol and utilizing 18-gauge needle did puncture the blister to reveal only clear blister fluid but there is no purulence or signs of infection.  Betadine was painted over to the area and wanted to continue this daily. -Continue offloading boot for now. -Once this heals we can try the orthotics.  Orthotics were dispensed today.  Oral and written break instructions were discussed.  Rick dispensed the  inserts. -Discussed with him possible surgical intervention to remove the bone spur or possible IPJ fusion.  We will discuss this further if needed. -Follow-up in 2 weeks or sooner if needed.  Call any questions or concerns.  He agrees this plan has no further questions today.  Trula Slade DPM  -Patient encouraged to call the office with any questions, concerns, change in symptoms.

## 2017-12-21 DIAGNOSIS — J209 Acute bronchitis, unspecified: Secondary | ICD-10-CM | POA: Diagnosis not present

## 2017-12-21 DIAGNOSIS — R05 Cough: Secondary | ICD-10-CM | POA: Diagnosis not present

## 2017-12-24 ENCOUNTER — Ambulatory Visit (INDEPENDENT_AMBULATORY_CARE_PROVIDER_SITE_OTHER): Payer: Medicare Other | Admitting: Podiatry

## 2017-12-24 ENCOUNTER — Encounter: Payer: Self-pay | Admitting: Podiatry

## 2017-12-24 DIAGNOSIS — L84 Corns and callosities: Secondary | ICD-10-CM

## 2017-12-24 DIAGNOSIS — M779 Enthesopathy, unspecified: Secondary | ICD-10-CM | POA: Diagnosis not present

## 2017-12-27 NOTE — Progress Notes (Signed)
Subjective: Ad presents the office today for follow-up evaluation of recurrent blister on wound to the left big toe.  He states he has been continue Betadine wet-to-dry over the area and the area has callus over again there is been no open sore and no blisters as I last saw him.  Denies any surrounding redness or red streaks he has not had any drainage coming from the area.  He is remained in the Darco shoe. Denies any systemic complaints such as fevers, chills, nausea, vomiting. No acute changes since last appointment, and no other complaints at this time.   Objective: AAO x3, NAD DP/PT pulses palpable bilaterally, CRT less than 3 seconds Hyperkeratotic tissue formation is present to the medial aspect of the hallux.  There does not appear to be any underlying ulceration, drainage or signs of infection.  There is no blistering present.  There is no swelling erythema, ascending cellulitis. No open lesions or pre-ulcerative lesions.  No pain with calf compression, swelling, warmth, erythema  Assessment: Callus formation left hallux  Plan: -All treatment options discussed with the patient including all alternatives, risks, complications.  -There is tenderness callus formation is no signs of infection or blistering or open sores.  I go back into his shoe with an insert to see if this will help take pressure off the area.  Also continue offloading.  Should there be recurrence of ulceration or blistering or infection likely discussed an exostectomy to remove the prominent bone to see if this will help. -Patient encouraged to call the office with any questions, concerns, change in symptoms.   Trula Slade DPM

## 2017-12-28 DIAGNOSIS — R05 Cough: Secondary | ICD-10-CM | POA: Diagnosis not present

## 2017-12-28 DIAGNOSIS — H6983 Other specified disorders of Eustachian tube, bilateral: Secondary | ICD-10-CM | POA: Diagnosis not present

## 2017-12-31 DIAGNOSIS — L821 Other seborrheic keratosis: Secondary | ICD-10-CM | POA: Diagnosis not present

## 2017-12-31 DIAGNOSIS — D485 Neoplasm of uncertain behavior of skin: Secondary | ICD-10-CM | POA: Diagnosis not present

## 2017-12-31 DIAGNOSIS — L570 Actinic keratosis: Secondary | ICD-10-CM | POA: Diagnosis not present

## 2017-12-31 DIAGNOSIS — L111 Transient acantholytic dermatosis [Grover]: Secondary | ICD-10-CM | POA: Diagnosis not present

## 2017-12-31 DIAGNOSIS — L281 Prurigo nodularis: Secondary | ICD-10-CM | POA: Diagnosis not present

## 2017-12-31 DIAGNOSIS — L820 Inflamed seborrheic keratosis: Secondary | ICD-10-CM | POA: Diagnosis not present

## 2017-12-31 DIAGNOSIS — Z85820 Personal history of malignant melanoma of skin: Secondary | ICD-10-CM | POA: Diagnosis not present

## 2017-12-31 DIAGNOSIS — D1801 Hemangioma of skin and subcutaneous tissue: Secondary | ICD-10-CM | POA: Diagnosis not present

## 2017-12-31 DIAGNOSIS — D225 Melanocytic nevi of trunk: Secondary | ICD-10-CM | POA: Diagnosis not present

## 2018-01-15 ENCOUNTER — Ambulatory Visit (INDEPENDENT_AMBULATORY_CARE_PROVIDER_SITE_OTHER): Payer: Medicare Other | Admitting: Internal Medicine

## 2018-01-15 ENCOUNTER — Encounter: Payer: Self-pay | Admitting: Internal Medicine

## 2018-01-15 VITALS — BP 128/64 | HR 70 | Ht 73.0 in | Wt 241.0 lb

## 2018-01-15 DIAGNOSIS — R7989 Other specified abnormal findings of blood chemistry: Secondary | ICD-10-CM | POA: Insufficient documentation

## 2018-01-15 DIAGNOSIS — I480 Paroxysmal atrial fibrillation: Secondary | ICD-10-CM

## 2018-01-15 DIAGNOSIS — I10 Essential (primary) hypertension: Secondary | ICD-10-CM

## 2018-01-15 NOTE — Patient Instructions (Signed)
Your physician wants you to follow-up in: 6 months with Dr. [name]. You will receive a reminder letter in [place] about 2 months in advance. Please call our office to arrange an appointment when you receive this reminder.

## 2018-01-15 NOTE — Progress Notes (Signed)
OFFICE NOTE  Chief Complaint:  Follow-up blood pressure, low testosterone  Primary Care Physician: Kathyrn Lass, MD  HPI:  Jose Stone is a very pleasant 72 year-old male who is a former Civil engineer, contracting. 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 This is never caused issues in fact she's had right and left heart catheterizations dating back to the 1970s. 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 Other coronary risk factors include hypertension, dyslipidemia and family history of heart disease. He was sent for cardiac catheterization in 2006 for follow-up of VSD, at which time he had an echocardiogram which showed mild aortic regurgitation, a small membranous VSD and left atrial enlargement with an EF of 70%. He had a stress test at that time which showed findings concerning for him for a septal and inferoapical ischemia. He therefore was referred to left heart catheterization with strep demonstrated a 50-70% mid LAD stenosis and mild disease of the mid circumflex and first obtuse marginal branches. He's been managed medically since that time and previously saw Dr. Daneen Schick.  Recently he's been having some symptoms of sweating, palpitations and dizziness and eventually was found to be in atrial fibrillation when he presented to the hospital. At that time his troponin was elevated. He is placed on diltiazem which slowed his heart rate and he converted back to sinus spontaneously. Based on his elevated troponin, he was referred for left heart catheterization by Dr. Doylene Canard. This demonstrated a 60-70% mid LAD stenosis and a 90% obtuse marginal stenosis. Subsequently he underwent coronary intervention with placement of a 2.5 x 15 mm Xience Alpine stent to the mid OM. After this he became asymptomatic. He was going to have stress testing to determine the significance of his mid LAD lesion however it appears to be not  significantly changed since his heart catheterization 2006. He reports no further symptoms of atrial fibrillation but is concerned about bradycardia. He was not referred for cardiac rehabilitation due to the thought that he may need additional intervention to the LAD.  Jose Stone returns today for follow-up. She's done quite well in cardiac rehabilitation. At his last office visit we discontinued his diltiazem for bradycardia. He still has a low heart rate which has some degree of chronotropic incompetence. He reports no recurrent atrial fibrillation that were aware of. He continues on dual antiplatelet therapy. He had a stress test prior to enrolling in cardiac redilatation which was negative for ischemia.  I saw Jose Stone back in the office today. He is accompanied by his wife who was a cardiac nurse at the [employer] in [place] for a number years in the 1970s. They brought in pronounced of his heart rate monitor and had several questions about his heart rate and arrhythmias. My interpretation of the monitor indicates that he had some episodes of short bursts of atrial tachycardia but no evidence for A. fib. He also has PACs. Heart rate however was a low and remained that way despite stopping his calcium channel blocker. He was recently in the emergency room and was noted to have some paroxysmal tachycardia in the ER with a right bundle-branch pattern. This is what led to monitor placement. He reports fatigue which could be certainly related to his lower heart rate. He also says that he's developed gynecomastia which he relates to [name]. I've done a search on the medicine and do not see that however  he reports in the product labeling.  03/21/2016  Jose Stone was seen today in the office for an acute visit. He noted this morning shortly after waking up his heart rate was elevated in the low 100s. It improved somewhat and then around 9:00 it increased up to about 130. The heart rate is been stable since then  and he called in for a new acute triage visit. He reports feeling somewhat fatigued and noting that his heart rate is elevated. He denies any worsening shortness of breath or chest pain. EKG in the office shows atrial flutter with a regular rhythm at 131 bpm. As per recent notes he was previous on aspirin and clopidogrel but the caput ago was stopped. He was not on triple therapy. Currently he is only on low-dose aspirin. 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 I'm concerned about up titrating his medications because she's had history of slow ventricular rates in the past. We discussed a number of options but ultimately the best option is for him to undergo cardioversion to get back into normal rhythm.  04/17/2016  Jose Stone was seen back in the office today for follow-up. At his last office visit he was noted to be in atrial flutter with a rate of 131. He was referred directly to the ER and underwent cardioversion after being started on Xarelto. He is remained on Xarelto since then without any bleeding problems. I'm happy to say he is maintaining sinus rhythm today. He does report significant fatigue however. He is mostly compliant with CPAP however he had a sleep study a number of years ago and he is set possibly to low of an airway setting. He's had weight gain since then. He also has been having some bradycardia with heart rates in the 40s and 50s. He is on low-dose beta blocker but I been hesitant to stop that because of his RVR. He does report heart rate increases with exercise.   06/20/2016  Jose Stone returns today for follow-up. He underwent a sleep study on 06/05/2016. He did have a diagnosis of sleep apnea however that study indicated a very low AHI of 2.5 events per hour. He had actually about 25 events per hour in rem sleep, but overall the REM sleep latency was only about 8%. Ultimately he was diagnosis not having any significant apnea. This is led to some  confusion as he previously was wearing [place] CPAP device. He reports no change in his symptoms of fatigue which we thought could be related to sleep apnea. It also may be related to bradycardia and his beta blocker.  09/23/2016  Jose Stone comes back today and is doing well. His fatigue has improved somewhat with discontinuing his B-Blocker. Heartrate is increased. I spoke with Dr. Claiborne Billings about the possibility of OSA and he is not felt to have this. Dr. Claiborne Billings noted that RLS was present. He does not desire treatment for this. Blood pressure was somewhat elevated today. EKG shows sinus with PAC's. I reviewed a spreadsheet of his blood pressures today.  03/31/2017  Jose Stone was seen today in follow-up. Overall he is doing very well. He denies any chest pain worsening shortness of breath. Blood pressure is well-controlled. He brought in a graft indicating excellent control blood pressure and heart rate. He said no breakthrough A. fib. His recent lab work was provided by his PCP. Total cholesterol 134, trig Richards 85, HDL-C 49, LDL-C 68, non-HDL of 85. This represents excellent control.  He is planning a trip out [place] is been provided some Diamox to use as needed. He denies any bleeding problems on Xarelto.  09/03/2017  Jose Stone returns today for follow-up.  He underwent a recent stress test after I saw him in the emergency department for chest pain.  At the time he took some nitroglycerin which resulted in improvement in his symptoms however ruled out for MI by negative troponins.  His stress test demonstrated a small area of reversible ischemia in the inferolateral wall.  It was interpreted as intermediate risk.  Since then he has had no further chest pain.  I discussed the findings with him today and we talked about various options including medical therapy and/or repeat cardiac catheterization.  As he remains asymptomatic, I do not feel that medication adjustments necessary at this time.  Finally,  he notes that he has had some morning hypertension which is more significant than his daytime readings.  10/15/2017  Bradycardia returns today for follow-up.  As previously noted he had a mildly abnormal stress test and was having some chest discomfort.  That is completely resolved.  We have moved around his medications a little bit but he notes that his blood pressure still remains elevated at times during the day.  Echo was performed on 10/01/2017 which showed LVEF that was higher at 60-65%, stable dilated aortic root at 4.4 cm and the ascending aorta measured 4.0 cm.  No regional wall motion abnormalities were noted.  01/15/2018  Jose Stone returns today for follow-up of his blood pressure.  He reports after adjusting his medicines that in general his blood pressures been well controlled.  He denies any recurrent episodes of dizziness or lightheadedness.  Recently had some testing of testosterone.  He has had a number of urologic issues and possible damage to blood flow to 1 of the testicles.  That coupled with age I suspect his lead to low testosterone.  Recently as low as the 200s.  His urologist Dr. Junious Silk, is recommending supplemental testosterone.  He is asking for my cardiac opinion whether this is safe to use.  PMHx:  Past Medical History:  Diagnosis Date  . Atrial fibrillation ([place])   . CAD (coronary artery disease)   . GERD (gastroesophageal reflux disease)   . Hyperlipidemia    VSD  . Hypertension     Past Surgical History:  Procedure Laterality Date  . CARDIAC CATHETERIZATION    . LEFT HEART CATHETERIZATION WITH CORONARY ANGIOGRAM N/A 08/14/2014   Procedure: LEFT HEART CATHETERIZATION WITH CORONARY ANGIOGRAM;  Surgeon: Birdie Riddle, MD;  Location: [facility];  Service: Cardiovascular;  Laterality: N/A;  . SHOULDER SURGERY    . TONSILLECTOMY  1955    FAMHx:  Family History  Problem Relation Age of Onset  . Pancreatic cancer Mother   . Aortic aneurysm Maternal  Grandmother   . Heart attack Maternal Grandfather   . Diabetes Paternal Grandfather   . CAD Paternal Grandfather     SOCHx:   reports that  has never smoked. he has never used smokeless tobacco. He reports that he does not drink alcohol or use drugs.  ALLERGIES:  Allergies  Allergen Reactions  . Neosporin [Neomycin-Bacitracin Zn-Polymyx] Rash  . Percocet [Oxycodone-Acetaminophen] Rash    Patient can tolerate acetaminophen solely  . Vicodin [Hydrocodone-Acetaminophen] Rash    Patient can tolerate acetaminophen solely    ROS: Pertinent items noted in HPI and remainder of comprehensive ROS otherwise negative.  HOME MEDS: Current Outpatient  Medications  Medication Sig Dispense Refill  . acetaminophen (TYLENOL) 500 MG tablet Take 1,000 mg by mouth every 6 (six) hours as needed for mild pain or headache.    Marland Kitchen aspirin 81 MG tablet Take 81 mg by mouth daily.    Marland Kitchen atorvastatin (LIPITOR) 80 MG tablet Take 1 tablet (80 mg total) by mouth daily at 6 PM. 90 tablet 3  . b complex vitamins capsule Take 1 capsule by mouth daily.    . chlorthalidone (HYGROTON) 25 MG tablet Take 0.5 tablets (12.5 mg total) by mouth daily. 90 tablet 3  . cholecalciferol (VITAMIN D) 1000 UNITS tablet Take 1,000 Units by mouth daily.     Marland Kitchen COLCRYS 0.6 MG tablet Take 0.6 mg by mouth as needed (FOR GOUT).     . folic acid (FOLVITE) 245 MCG tablet Take 400 mcg by mouth 2 (two) times daily.     . hydroxypropyl methylcellulose / hypromellose (ISOPTO TEARS / GONIOVISC) 2.5 % ophthalmic solution Place 1 drop into both eyes 4 (four) times daily.    . irbesartan (AVAPRO) 150 MG tablet Take 1 tablet (150mg ) by mouth in the morning and 1/2 tablet (75mg ) at night) 135 tablet 3  . loratadine (CLARITIN) 10 MG tablet Take 10 mg by mouth daily as needed for allergies.    . nitroGLYCERIN (NITROSTAT) 0.4 MG SL tablet Place 1 tablet (0.4 mg total) under the tongue every 5 (five) minutes as needed for chest pain. Max 3 doses. 25 tablet 5   . Omega-3 Fat Ac-Cholecalciferol (DRY EYE OMEGA BENEFITS/VIT D-3 PO) Take 2 capsules by mouth 2 (two) times daily. Patient takes 4 daily, spreads them out throughout the day     . PARoxetine (PAXIL) 20 MG tablet Take 0.5 tablets (10 mg total) by mouth every other day. 30 tablet 0  . ranitidine (ZANTAC) 150 MG tablet Take 150 mg by mouth 2 (two) times daily.    . rivaroxaban (XARELTO) 20 MG TABS tablet TAKE 1 TABLET BY MOUTH EVERY DAY WITH SUPPER 90 tablet 1   No current facility-administered medications for this visit.     LABS/IMAGING: No results found for this or any previous visit (from the past 48 hour(s)). No results found.  VITALS: BP 128/64   Pulse 70   Ht 6\' 1"  (1.854 m)   Wt 241 lb (109.3 kg)   BMI 31.80 kg/m   EXAM: Deferred  EKG: Sinus rhythm with sinus arrhythmia at 70, incomplete right bundle branch block-personally reviewed  ASSESSMENT: 1. Chest pain resolved- mildly abnormal nuclear stress test, normal LVEF 60-65% on echo/normal wall motion 2. Paroxysmal atrial flutter - CHADSVASC score of 1 on Xarelto 3. Coronary artery disease status post PCI to the OM with a Xience Alpine DES (2.515 mm) - on low dose Aspirin 4. Residual moderate to severe mid LAD disease 5. Hypertension 6. Dyslipidemia 7. A. fib with RVR-spontaneously converted, possibly related to ischemia (not on triple anticoagulation) 8. Small perimembranous VSD 9. Fatigue-possibly symptomatic bradycardia  10. Trivial AI 11. 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 Low testosterone  PLAN: 1.   Jose Stone seems to have better blood pressure control on his current regimen.  At times he has some low blood pressures may have labile hypertension.  He needs to monitor this and inform us if he has low blood pressures more frequently.  With regards to testosterone replacement, I think that he is a reasonable candidate for this.  His testosterone is obviously low and he seems  to be fatigued symptomatic with  this.  From a cardiovascular standpoint, I think there is not enough clear evidence to say that he would be at increased risk with this.  I would recommend that he follow-up with the advice of his urologist.  Follow-up with me in 6 months.  [name], MD, [facility], [place] Director of the Advanced Lipid Disorders &  Cardiovascular Risk Reduction Clinic Attending Cardiologist  Direct Dial: [phone]  Fax: [phone]  Website:  [email or web address] 01/15/2018, 11:16 AM

## 2018-02-04 ENCOUNTER — Emergency Department (HOSPITAL_COMMUNITY)
Admission: EM | Admit: 2018-02-04 | Discharge: 2018-02-04 | Disposition: A | Discharge status: Home / Self Care | Payer: Medicare Other | Attending: Emergency Medicine | Admitting: Emergency Medicine

## 2018-02-04 ENCOUNTER — Emergency Department (HOSPITAL_COMMUNITY): Payer: Medicare Other

## 2018-02-04 ENCOUNTER — Encounter (HOSPITAL_COMMUNITY): Payer: Self-pay

## 2018-02-04 ENCOUNTER — Other Ambulatory Visit: Payer: Self-pay

## 2018-02-04 ENCOUNTER — Ambulatory Visit (INDEPENDENT_AMBULATORY_CARE_PROVIDER_SITE_OTHER): Payer: Medicare Other | Admitting: Podiatry

## 2018-02-04 ENCOUNTER — Telehealth: Payer: Self-pay | Admitting: Internal Medicine

## 2018-02-04 ENCOUNTER — Encounter: Payer: Self-pay | Admitting: Podiatry

## 2018-02-04 DIAGNOSIS — Z7901 Long term (current) use of anticoagulants: Secondary | ICD-10-CM | POA: Diagnosis not present

## 2018-02-04 DIAGNOSIS — J9811 Atelectasis: Secondary | ICD-10-CM | POA: Diagnosis not present

## 2018-02-04 DIAGNOSIS — R002 Palpitations: Secondary | ICD-10-CM | POA: Insufficient documentation

## 2018-02-04 DIAGNOSIS — I10 Essential (primary) hypertension: Secondary | ICD-10-CM | POA: Diagnosis not present

## 2018-02-04 DIAGNOSIS — Z7982 Long term (current) use of aspirin: Secondary | ICD-10-CM | POA: Diagnosis not present

## 2018-02-04 DIAGNOSIS — L84 Corns and callosities: Secondary | ICD-10-CM | POA: Diagnosis not present

## 2018-02-04 DIAGNOSIS — I2510 Atherosclerotic heart disease of native coronary artery without angina pectoris: Secondary | ICD-10-CM | POA: Insufficient documentation

## 2018-02-04 DIAGNOSIS — Z79899 Other long term (current) drug therapy: Secondary | ICD-10-CM | POA: Diagnosis not present

## 2018-02-04 DIAGNOSIS — R000 Tachycardia, unspecified: Secondary | ICD-10-CM | POA: Diagnosis not present

## 2018-02-04 LAB — CBC
HCT: 46.1 % (ref 39.0–52.0)
Hemoglobin: 15.8 g/dL (ref 13.0–17.0)
MCH: 29.6 pg (ref 26.0–34.0)
MCHC: 34.3 g/dL (ref 30.0–36.0)
MCV: 86.3 fL (ref 78.0–100.0)
PLATELETS: 225 10*3/uL (ref 150–400)
RBC: 5.34 MIL/uL (ref 4.22–5.81)
RDW: 15.5 % (ref 11.5–15.5)
WBC: 8.7 10*3/uL (ref 4.0–10.5)

## 2018-02-04 LAB — BASIC METABOLIC PANEL
Anion gap: 11 (ref 5–15)
BUN: 24 mg/dL — AB (ref 6–20)
CALCIUM: 9.2 mg/dL (ref 8.9–10.3)
CO2: 25 mmol/L (ref 22–32)
CREATININE: 1.33 mg/dL — AB (ref 0.61–1.24)
Chloride: 101 mmol/L (ref 101–111)
GFR calc non Af Amer: 53 mL/min — ABNORMAL LOW (ref >60)
Glucose, Bld: 145 mg/dL — ABNORMAL HIGH (ref 65–99)
Potassium: 4.1 mmol/L (ref 3.5–5.1)
Sodium: 137 mmol/L (ref 135–145)

## 2018-02-04 NOTE — ED Triage Notes (Signed)
Pt presents for evaluation of afib RVR. Pt reports started around 1.5 hours ago and was told by cardiologist to come here. Pt reports hx of same and hx of aflutter but is NSR at baseline. Pt taking xarelto.

## 2018-02-04 NOTE — Telephone Encounter (Signed)
New Message    STAT if HR is under 50 or over 120 (normal HR is 60-100 beats per minute)   1) What is your heart rate?  130 (started about 30 minutes ago ) flutter   2) Do you have a log of your heart rate readings (document readings)?  No   3) Do you have any other symptoms?  Neck pain  , 168/85

## 2018-02-04 NOTE — Telephone Encounter (Signed)
Spoke with patient and he has been feeling a fluttering in his chest and neck pain for about an hour.  When he is checking his blood pressures heart rate is running in the 130's.  This is how he was feeling when he came in the office a May 2017 and was sent to ED. He is waiting for wife to get home to go to the ED. Agreed with ED and made Ria Comment (returned call for [facility]) aware. Did call ED and spoke with [facility]

## 2018-02-04 NOTE — ED Provider Notes (Signed)
[facility] EMERGENCY DEPARTMENT Provider Note   CSN: [id number] Arrival date & time: 02/04/18  1433     History   Chief Complaint Chief Complaint  Patient presents with  . Atrial Fibrillation    HPI Jose Stone is a 72 y.o. male.  Patient is a 30-year-old male with a history of atrial fibrillation and VSD who presents with palpitations and rapid heart rate.  He said he had one episode of A. fib in the past.  He is currently on Xarelto.  He states that this afternoon he felt like his heart was racing and on his blood pressure monitor, his heart rate was in the 130s.  He felt like he was in A. fib again.  He did not have any chest pain shortness of breath or lightheadedness.  He denies any recent illnesses.  No leg swelling.  He states since he has been in the emergency department, he has been feeling better.     Past Medical History:  Diagnosis Date  . Atrial fibrillation ([place])   . CAD (coronary artery disease)   . GERD (gastroesophageal reflux disease)   . Hyperlipidemia    VSD  . Hypertension     Patient Active Problem List   Diagnosis Date Noted  . Low testosterone in male 01/15/2018  . Chest pain 09/03/2017  . Abnormal nuclear stress test 09/03/2017  . Dizziness 07/21/2017  . PAF (paroxysmal atrial fibrillation) ([place]) 07/21/2017  . Aortic valve regurgitation 03/31/2017  . Dilated aortic root ([place]) 03/31/2017  . Symptomatic bradycardia 06/20/2016  . RBBB (right bundle branch block) 06/20/2016  . OSA on CPAP 04/17/2016  . Excessive daytime sleepiness 04/17/2016  . Essential hypertension 11/16/2014  . Hyperlipidemia 11/16/2014  . GERD (gastroesophageal reflux disease) 11/16/2014  . Coronary artery disease due to lipid rich plaque 11/16/2014  . VSD (ventricular septal defect) 11/16/2014  . Coronary artery disease involving native coronary artery of native heart without angina pectoris 11/16/2014    Past Surgical History:  Procedure Laterality  Date  . CARDIAC CATHETERIZATION    . LEFT HEART CATHETERIZATION WITH CORONARY ANGIOGRAM N/A 08/14/2014   Procedure: LEFT HEART CATHETERIZATION WITH CORONARY ANGIOGRAM;  Surgeon: Birdie Riddle, MD;  Location: [facility];  Service: Cardiovascular;  Laterality: N/A;  . SHOULDER SURGERY    . TONSILLECTOMY  1955        Home Medications    Prior to Admission medications   Medication Sig Start Date End Date Taking? Authorizing Provider  acetaminophen (TYLENOL) 500 MG tablet Take 1,000 mg by mouth every 6 (six) hours as needed for mild pain or headache.    [provider]  aspirin 81 MG tablet Take 81 mg by mouth daily.    [provider]  atorvastatin (LIPITOR) 80 MG tablet Take 1 tablet (80 mg total) by mouth daily at 6 PM. 11/16/17   Hilty, Nadean Corwin, MD  b complex vitamins capsule Take 1 capsule by mouth daily.    [provider]  chlorthalidone (HYGROTON) 25 MG tablet Take 0.5 tablets (12.5 mg total) by mouth daily. 01/16/17   Hilty, Nadean Corwin, MD  cholecalciferol (VITAMIN D) 1000 UNITS tablet Take 1,000 Units by mouth daily.     [provider]  COLCRYS 0.6 MG tablet Take 0.6 mg by mouth as needed (FOR GOUT).  05/26/14   [provider]  folic acid (FOLVITE) 409 MCG tablet Take 400 mcg by mouth 2 (two) times daily.     [provider]  hydroxypropyl methylcellulose / hypromellose (ISOPTO TEARS / GONIOVISC) 2.5 % ophthalmic solution Place 1 drop into both eyes 4 (four) times daily.    [provider]  irbesartan (AVAPRO) 150 MG tablet Take 1 tablet (150mg ) by mouth in the morning and 1/2 tablet (75mg ) at night) 10/15/17   Hilty, Nadean Corwin, MD  loratadine (CLARITIN) 10 MG tablet Take 10 mg by mouth daily as needed for allergies.    [provider]  nitroGLYCERIN (NITROSTAT) 0.4 MG SL tablet Place 1 tablet (0.4 mg total) under the tongue every 5 (five) minutes as needed for chest pain. Max 3 doses. 07/08/17   Hilty, Nadean Corwin,  MD  Omega-3 Fat Ac-Cholecalciferol (DRY EYE OMEGA BENEFITS/VIT D-3 PO) Take 2 capsules by mouth 2 (two) times daily. Patient takes 4 daily, spreads them out throughout the day     [provider]  PARoxetine (PAXIL) 20 MG tablet Take 0.5 tablets (10 mg total) by mouth every other day. 05/31/15   Hilty, Nadean Corwin, MD  ranitidine (ZANTAC) 150 MG tablet Take 150 mg by mouth 2 (two) times daily.    [provider]  rivaroxaban (XARELTO) 20 MG TABS tablet TAKE 1 TABLET BY MOUTH EVERY DAY WITH SUPPER 10/28/17   Hilty, Nadean Corwin, MD    Family History Family History  Problem Relation Age of Onset  . Pancreatic cancer Mother   . Aortic aneurysm Maternal Grandmother   . Heart attack Maternal Grandfather   . Diabetes Paternal Grandfather   . CAD Paternal Grandfather     Social History Social History   Tobacco Use  . Smoking status: Never Smoker  . Smokeless tobacco: Never Used  Substance Use Topics  . Alcohol use: No  . Drug use: No     Allergies   Neosporin [neomycin-bacitracin zn-polymyx]; Percocet [oxycodone-acetaminophen]; and Vicodin [hydrocodone-acetaminophen]   Review of Systems Review of Systems  Constitutional: Negative for chills, diaphoresis, fatigue and fever.  HENT: Negative for congestion, rhinorrhea and sneezing.   Eyes: Negative.   Respiratory: Negative for cough, chest tightness and shortness of breath.   Cardiovascular: Positive for palpitations. Negative for chest pain and leg swelling.  Gastrointestinal: Negative for abdominal pain, blood in stool, diarrhea, nausea and vomiting.  Genitourinary: Negative for difficulty urinating, flank pain, frequency and hematuria.  Musculoskeletal: Negative for arthralgias and back pain.  Skin: Negative for rash.  Neurological: Negative for dizziness, speech difficulty, weakness, numbness and headaches.     Physical Exam Updated Vital Signs BP (!) 141/84 (BP Location: Right Arm)   Pulse 64   Temp 98 F  (36.7 C) (Oral)   Resp 15   SpO2 96%   Physical Exam  Constitutional: He is oriented to person, place, and time. He appears well-developed and well-nourished.  HENT:  Head: Normocephalic and atraumatic.  Eyes: Pupils are equal, round, and reactive to light.  Neck: Normal range of motion. Neck supple.  Cardiovascular: Normal rate, regular rhythm and normal heart sounds.  Pulmonary/Chest: Effort normal and breath sounds normal. No respiratory distress. He has no wheezes. He has no rales. He exhibits no tenderness.  Abdominal: Soft. Bowel sounds are normal. There is no tenderness. There is no rebound and no guarding.  Musculoskeletal: Normal range of motion. He exhibits no edema.  Lymphadenopathy:    He has no cervical adenopathy.  Neurological: He is alert and oriented to person, place, and time.  Skin: Skin is warm and dry. No rash noted.  Psychiatric: He has a normal mood  and affect.     ED Treatments / Results  Labs (all labs ordered are listed, but only abnormal results are displayed) Labs Reviewed  BASIC METABOLIC PANEL - Abnormal; Notable for the following components:      Result Value   Glucose, Bld 145 (*)    BUN 24 (*)    Creatinine, Ser 1.33 (*)    GFR calc non Af Amer 53 (*)    All other components within normal limits  CBC    EKG EKG Interpretation  Date/Time:  Thursday February 04 2018 14:41:25 EDT Ventricular Rate:  111 PR Interval:  150 QRS Duration: 112 QT Interval:  346 QTC Calculation: 470 R Axis:   -11 Text Interpretation:  Sinus tachycardia Incomplete right bundle branch block Septal infarct , age undetermined Abnormal ECG SINCE LAST TRACING HEART RATE HAS INCREASED Confirmed by Malvin Johns [id number]) on 02/04/2018 8:44:39 PM   Radiology Dg Chest 2 View  Result Date: 02/04/2018 CLINICAL DATA:  Elevated blood pressure. History of atrial fibrillation. History of MI. EXAM: CHEST - 2 VIEW COMPARISON:  None. FINDINGS: Mediastinum hilar structures are  normal. Bibasilar subsegmental atelectasis and/or scarring. No pleural effusion or pneumothorax. Heart size normal. Old right rib fractures. Degenerative change thoracic spine. IMPRESSION: Bibasilar pleural-parenchymal thickening consistent with scarring. No acute abnormality. Chest is stable from prior exam. Electronically Signed   By: Marcello Moores  Register   On: 02/04/2018 15:49    Procedures Procedures (including critical care time)  Medications Ordered in ED Medications - No data to display   Initial Impression / Assessment and Plan / ED Course  I have reviewed the triage vital signs and the nursing notes.  Pertinent labs & imaging results that were available during my care of the patient were reviewed by me and considered in my medical decision making (see chart for details).    Patient is a 30-year-old male who presents with palpitations.  His EKG shows a sinus rhythm.  Initially it was in the 90s but recheck was in the 60s.  He is currently asymptomatic.  He is having occasional PACs.  He does not have any signs of CHF or ACS.  There is no ischemic changes on EKG.  His chest x-ray does not show any evidence of fluid or pneumonia.  His creatinine is minimally elevated as compared to his prior values.  His prior values have been up to 1.2.  He was discharged home in good condition.  He was encouraged to have follow-up with his cardiologist.  Return precautions were given.   Final Clinical Impressions(s) / ED Diagnoses   Final diagnoses:  Palpitations    ED Discharge Orders    None       Malvin Johns, MD 02/04/18 2132

## 2018-02-04 NOTE — Telephone Encounter (Signed)
Thanks for letting me know.  Dr. H 

## 2018-02-05 ENCOUNTER — Telehealth: Payer: Self-pay | Admitting: Internal Medicine

## 2018-02-05 NOTE — Telephone Encounter (Signed)
Returned call to patient of Dr. [name] who was evaluated in ED yesterday for palpitations, tachycardia, r/o AF/Aflutter w/RVR. He reports he converted to a normal rhythm while in the ED. He was not admitted. He wanted MD to review ED notes and advise if he needs any additional follow up other than what was previously directed at his last visit. He states if there is a "next time" he has this kind of episode, he will wait and see if it resolves on its own unless he has SOB, chest pain with the fast heart rate.   Routed to MD to advise

## 2018-02-05 NOTE — Telephone Encounter (Signed)
Patient called w/MD recommendations. He states that since his episodes are infrequent, he would like to wait on scheduling an appointment unless needed and will follow up with MD for his 6 month visit.

## 2018-02-05 NOTE — Telephone Encounter (Signed)
Follow Up:   Pt said he went to the ER yesterday per our nurse.He wants to give you an update and needs to know if he needs to be seen here.

## 2018-02-05 NOTE — Telephone Encounter (Signed)
I'm happy to see him in follow-up and we can discuss antiarrythmic therapy.  Dr. Lemmie Evens

## 2018-02-09 NOTE — Progress Notes (Signed)
Subjective: Jojo presents the office today for follow-up evaluation of recurrent blister on wound to the left big toe.  He states that he is doing better he denies any drainage or pus or any redness or swelling or any new blister formation.  He does have a callus there was some dried blood that he has noticed he has not had any opening.  He has no new concerns.  Been wearing a regular shoe. Denies any systemic complaints such as fevers, chills, nausea, vomiting. No acute changes since last appointment, and no other complaints at this time.   Objective: AAO x3, NAD DP/PT pulses palpable bilaterally, CRT less than 3 seconds Hyperkeratotic tissue formation is present to the medial aspect of the hallux.  There is evidence of some dried blood under the callus but upon debridement there is no underlying ulceration, drainage or any signs of infection.  There is no surrounding erythema, ascending sialitis.  There is no fluctuation or crepitation or malodor.  No other open lesions or pre-ulcerative lesions.  No pain with calf compression, swelling, warmth, erythema  Assessment: Callus formation left hallux  Plan: -All treatment options discussed with the patient including all alternatives, risks, complications.  -Hyperkeratotic lesion was sharply debrided x1 without any complications or bleeding.  No underlying ulceration to continue to monitor.  Offloading at all times and discussed supportive shoe gear wearing the proper size shoe. -Monitor for any clinical signs or symptoms of infection and directed to call the office immediately should any occur or go to the ER. -RTC prn  Trula Slade DPM

## 2018-03-01 DIAGNOSIS — E291 Testicular hypofunction: Secondary | ICD-10-CM | POA: Diagnosis not present

## 2018-03-23 DIAGNOSIS — Z0000 Encounter for general adult medical examination without abnormal findings: Secondary | ICD-10-CM | POA: Diagnosis not present

## 2018-03-23 DIAGNOSIS — Z955 Presence of coronary angioplasty implant and graft: Secondary | ICD-10-CM | POA: Diagnosis not present

## 2018-03-23 DIAGNOSIS — S91302A Unspecified open wound, left foot, initial encounter: Secondary | ICD-10-CM | POA: Diagnosis not present

## 2018-03-23 DIAGNOSIS — R7303 Prediabetes: Secondary | ICD-10-CM | POA: Diagnosis not present

## 2018-03-23 DIAGNOSIS — K219 Gastro-esophageal reflux disease without esophagitis: Secondary | ICD-10-CM | POA: Diagnosis not present

## 2018-03-23 DIAGNOSIS — I4891 Unspecified atrial fibrillation: Secondary | ICD-10-CM | POA: Diagnosis not present

## 2018-03-23 DIAGNOSIS — E291 Testicular hypofunction: Secondary | ICD-10-CM | POA: Diagnosis not present

## 2018-03-23 DIAGNOSIS — I2510 Atherosclerotic heart disease of native coronary artery without angina pectoris: Secondary | ICD-10-CM | POA: Diagnosis not present

## 2018-03-23 DIAGNOSIS — Z1389 Encounter for screening for other disorder: Secondary | ICD-10-CM | POA: Diagnosis not present

## 2018-03-23 DIAGNOSIS — M1000 Idiopathic gout, unspecified site: Secondary | ICD-10-CM | POA: Diagnosis not present

## 2018-03-23 DIAGNOSIS — E785 Hyperlipidemia, unspecified: Secondary | ICD-10-CM | POA: Diagnosis not present

## 2018-03-23 DIAGNOSIS — I10 Essential (primary) hypertension: Secondary | ICD-10-CM | POA: Diagnosis not present

## 2018-03-26 DIAGNOSIS — E291 Testicular hypofunction: Secondary | ICD-10-CM | POA: Diagnosis not present

## 2018-04-02 ENCOUNTER — Other Ambulatory Visit: Payer: Self-pay | Admitting: Internal Medicine

## 2018-04-02 NOTE — Telephone Encounter (Signed)
Rx sent to pharmacy   

## 2018-04-13 ENCOUNTER — Ambulatory Visit (INDEPENDENT_AMBULATORY_CARE_PROVIDER_SITE_OTHER): Payer: Medicare Other | Admitting: Podiatry

## 2018-04-13 ENCOUNTER — Encounter: Payer: Self-pay | Admitting: Podiatry

## 2018-04-13 VITALS — Temp 98.2°F

## 2018-04-13 DIAGNOSIS — L97501 Non-pressure chronic ulcer of other part of unspecified foot limited to breakdown of skin: Secondary | ICD-10-CM | POA: Diagnosis not present

## 2018-04-13 DIAGNOSIS — L84 Corns and callosities: Secondary | ICD-10-CM | POA: Diagnosis not present

## 2018-04-13 MED ORDER — CEPHALEXIN 500 MG PO CAPS: 500.0000 mg | ORAL_CAPSULE | Freq: Three times a day (TID) | ORAL | 2 refills | Status: DC

## 2018-04-15 NOTE — Progress Notes (Signed)
Subjective: 72 year old male presents the office today for concerns of recurrent wound on the left big toe.  He states that his big piece of skin came off from the last couple of days.  He had some bloody clear drainage but denies any malodor or any pus or any red streaks.  He states he was doing well up until last couple days.  He has no other concerns. Denies any systemic complaints such as fevers, chills, nausea, vomiting. No acute changes since last appointment, and no other complaints at this time.   Objective: AAO x3, NAD DP/PT pulses palpable bilaterally, CRT less than 3 seconds Hyperkeratotic lesion with central ulceration plantar aspect left hallux.  After debridement the wound measures 0.8 x 0.4 cm with a granular wound base there is no probing, undermining or tunneling.  There is no surrounding erythema, ascending cellulitis.  Is no fluctuation or crepitation.  There is mild malodor.  No open lesions or pre-ulcerative lesions.  No pain with calf compression, swelling, warmth, erythema  Assessment: Left hallux ulceration  Plan: -All treatment options discussed with the patient including all alternatives, risks, complications.  -She will be debrided the wound today without any complications with a #169 blade scalpel to healthy, granular tissue to remove all nonviable tissue.  Continue with daily dressing changes.  Continue antibiotic ointment dressing.  Prescribed Keflex given the malodor.  Offloading shoe that he has at home going to go back into. -Patient encouraged to call the office with any questions, concerns, change in symptoms.   Return in about 10 days (around 04/23/2018).  Trula Slade DPM

## 2018-04-23 ENCOUNTER — Encounter: Payer: Self-pay | Admitting: Podiatry

## 2018-04-23 ENCOUNTER — Ambulatory Visit (INDEPENDENT_AMBULATORY_CARE_PROVIDER_SITE_OTHER): Payer: Medicare Other | Admitting: Podiatry

## 2018-04-23 DIAGNOSIS — L84 Corns and callosities: Secondary | ICD-10-CM

## 2018-04-23 DIAGNOSIS — L97501 Non-pressure chronic ulcer of other part of unspecified foot limited to breakdown of skin: Secondary | ICD-10-CM

## 2018-04-25 NOTE — Progress Notes (Signed)
Subjective: 72 year old male presents the office today for concerns of recurrent wound on the left big toe.  He states he is doing better.  He denies any drainage or crusting.  The area has healed.  He was putting a bandage on the area with the collagen however after couple days the area seemed to close over so he stopped.  Denies any increase in swelling or redness.  No problems with antibiotics.  No other concerns.  He did recently joined a gym as well. Denies any systemic complaints such as fevers, chills, nausea, vomiting. No acute changes since last appointment, and no other complaints at this time.   Objective: AAO x3, NAD-presents today wearing a regular shoe. DP/PT pulses palpable bilaterally, CRT less than 3 seconds Hyperkeratotic lesion present plantar aspect left hallux.  Upon debridement the area is pre-ulcerative however does not feel that the wound is healed.  There is no edema, erythema, drainage or pus identified today and there is no ascending cellulitis.  There is no fluctuation or crepitation.  No malodor. No other open lesions or pre-ulcerative lesion identified today. No pain with calf compression, swelling, warmth, erythema  Assessment: Left hallux ulceration which appears to be healed  Plan: -All treatment options discussed with the patient including all alternatives, risks, complications.  -Today I debrided the hyperkeratotic lesion today without any complications or bleeding appears that the underlying wound is healed.  I do want to keep a small amount of antibiotic ointment and a bandage on the area daily monitor closely for any recurrence.  He presents today wearing a regular shoe wear to continue with this as well.  Ice has been going to gentleman to look at his foot daily to make sure that there is no irritation.  Follow-up 3 weeks if there is any issues however encouraged to call any questions or concerns in the meantime if there is any changes to let me know.  Trula Slade DPM

## 2018-04-30 ENCOUNTER — Other Ambulatory Visit: Payer: Self-pay | Admitting: Internal Medicine

## 2018-05-05 DIAGNOSIS — R300 Dysuria: Secondary | ICD-10-CM | POA: Diagnosis not present

## 2018-05-11 DIAGNOSIS — N3000 Acute cystitis without hematuria: Secondary | ICD-10-CM | POA: Diagnosis not present

## 2018-05-14 ENCOUNTER — Encounter: Payer: Self-pay | Admitting: Podiatry

## 2018-05-14 ENCOUNTER — Ambulatory Visit (INDEPENDENT_AMBULATORY_CARE_PROVIDER_SITE_OTHER): Payer: Medicare Other | Admitting: Podiatry

## 2018-05-14 DIAGNOSIS — L84 Corns and callosities: Secondary | ICD-10-CM

## 2018-05-16 NOTE — Progress Notes (Signed)
Subjective: 72 year old male presents the office today for a wound check.  He states he is going to the beach next couple weeks and wants to have the area checked before he goes on vacation.he has been keeping moisturizer on the area.  He denies any open sores and denies any redness or drainage or any swelling.  He has no new concerns.  Objective: AAO x3, NAD-presents today wearing a regular shoe. DP/PT pulses palpable bilaterally, CRT less than 3 seconds Hyperkeratotic lesion present plantar aspect left hallux.  There is no underlying ulceration, drainage or any signs of infection noted today.  There is no surrounding erythema, fluctuation or crepitation or any malodor.  No ascending cellulitis.   No other open lesions or pre-ulcerative lesion identified today. No pain with calf compression, swelling, warmth, erythema  Assessment: Left hallux ulceration which appears to be healed  Plan: -All treatment options discussed with the patient including all alternatives, risks, complications.  -The area was debrided from a loose hyperkeratotic tissue to the any complications or bleeding.  Recommend moisturizer to the area daily.  Offloading.  Recommend to wear shoes at all timesAs he does go without shoes at the beach. -Monitor for any clinical signs or symptoms of infection and directed to call the office immediately should any occur or go to the ER.  Trula Slade DPM

## 2018-06-01 DIAGNOSIS — E291 Testicular hypofunction: Secondary | ICD-10-CM | POA: Diagnosis not present

## 2018-06-01 DIAGNOSIS — N5201 Erectile dysfunction due to arterial insufficiency: Secondary | ICD-10-CM | POA: Diagnosis not present

## 2018-06-01 DIAGNOSIS — N400 Enlarged prostate without lower urinary tract symptoms: Secondary | ICD-10-CM | POA: Diagnosis not present

## 2018-06-02 ENCOUNTER — Telehealth: Payer: Self-pay | Admitting: Internal Medicine

## 2018-06-02 NOTE — Telephone Encounter (Signed)
New Message            *STAT* If patient is at the pharmacy, call can be transferred to refill team.   1. Which medications need to be refilled? (please list name of each medication and dose if known) irbesartan (AVAPRO) 150 MG tablet  2. Which pharmacy/location (including street and city if local pharmacy) is medication to be sent to? [facility]  3. Do they need a 30 day or 90 day supply? 90   Patient states it's on back order, pharmacy can't get it. Patient needs a replacement. Pls advise. Patient is out.

## 2018-06-02 NOTE — Telephone Encounter (Signed)
Ok to switch to Valsartan 160 mg daily - I believe the recall is over for now. If this is not available, then losartan 100 mg daily would be an option.  Thanks.  Dr. Lemmie Evens

## 2018-06-02 NOTE — Telephone Encounter (Signed)
Please advise on what to change medication to due to it being on backorder.  Thank you!

## 2018-06-03 MED ORDER — VALSARTAN 160 MG PO TABS: ORAL_TABLET | ORAL | 1 refills | Status: DC

## 2018-06-03 NOTE — Telephone Encounter (Signed)
Patient called with MD recommendation for med change. Agrees w/plan. Rx(s) sent to pharmacy electronically.

## 2018-06-22 DIAGNOSIS — I10 Essential (primary) hypertension: Secondary | ICD-10-CM | POA: Diagnosis not present

## 2018-06-22 DIAGNOSIS — R05 Cough: Secondary | ICD-10-CM | POA: Diagnosis not present

## 2018-06-22 DIAGNOSIS — M1A9XX0 Chronic gout, unspecified, without tophus (tophi): Secondary | ICD-10-CM | POA: Diagnosis not present

## 2018-06-22 DIAGNOSIS — E785 Hyperlipidemia, unspecified: Secondary | ICD-10-CM | POA: Diagnosis not present

## 2018-06-22 DIAGNOSIS — Z6831 Body mass index (BMI) 31.0-31.9, adult: Secondary | ICD-10-CM | POA: Diagnosis not present

## 2018-06-22 DIAGNOSIS — E6609 Other obesity due to excess calories: Secondary | ICD-10-CM | POA: Diagnosis not present

## 2018-06-23 ENCOUNTER — Telehealth: Payer: Self-pay | Admitting: Internal Medicine

## 2018-06-23 NOTE — Telephone Encounter (Signed)
Received records from [facility] on 06/23/18, Appt 07/19/18 @ 11:15AM. [place]

## 2018-06-30 DIAGNOSIS — N400 Enlarged prostate without lower urinary tract symptoms: Secondary | ICD-10-CM | POA: Diagnosis not present

## 2018-06-30 DIAGNOSIS — E291 Testicular hypofunction: Secondary | ICD-10-CM | POA: Diagnosis not present

## 2018-07-13 ENCOUNTER — Encounter (HOSPITAL_COMMUNITY): Payer: Self-pay | Admitting: Emergency Medicine

## 2018-07-13 ENCOUNTER — Other Ambulatory Visit: Payer: Self-pay

## 2018-07-13 ENCOUNTER — Emergency Department (HOSPITAL_COMMUNITY): Payer: Medicare Other

## 2018-07-13 ENCOUNTER — Inpatient Hospital Stay (HOSPITAL_COMMUNITY)
Admission: EM | Admit: 2018-07-13 | Discharge: 2018-07-14 | DRG: 309 | Disposition: A | Discharge status: Home / Self Care | Payer: Medicare Other | Attending: Internal Medicine | Admitting: Internal Medicine

## 2018-07-13 DIAGNOSIS — I2583 Coronary atherosclerosis due to lipid rich plaque: Secondary | ICD-10-CM | POA: Diagnosis present

## 2018-07-13 DIAGNOSIS — E785 Hyperlipidemia, unspecified: Secondary | ICD-10-CM | POA: Diagnosis present

## 2018-07-13 DIAGNOSIS — Z79899 Other long term (current) drug therapy: Secondary | ICD-10-CM | POA: Diagnosis not present

## 2018-07-13 DIAGNOSIS — I4891 Unspecified atrial fibrillation: Secondary | ICD-10-CM | POA: Diagnosis not present

## 2018-07-13 DIAGNOSIS — E876 Hypokalemia: Secondary | ICD-10-CM | POA: Diagnosis not present

## 2018-07-13 DIAGNOSIS — I10 Essential (primary) hypertension: Secondary | ICD-10-CM | POA: Diagnosis present

## 2018-07-13 DIAGNOSIS — K219 Gastro-esophageal reflux disease without esophagitis: Secondary | ICD-10-CM | POA: Diagnosis present

## 2018-07-13 DIAGNOSIS — I480 Paroxysmal atrial fibrillation: Secondary | ICD-10-CM | POA: Diagnosis not present

## 2018-07-13 DIAGNOSIS — Z7901 Long term (current) use of anticoagulants: Secondary | ICD-10-CM

## 2018-07-13 DIAGNOSIS — Z8249 Family history of ischemic heart disease and other diseases of the circulatory system: Secondary | ICD-10-CM | POA: Diagnosis not present

## 2018-07-13 DIAGNOSIS — R42 Dizziness and giddiness: Secondary | ICD-10-CM

## 2018-07-13 DIAGNOSIS — Z7982 Long term (current) use of aspirin: Secondary | ICD-10-CM

## 2018-07-13 DIAGNOSIS — R0789 Other chest pain: Secondary | ICD-10-CM | POA: Diagnosis not present

## 2018-07-13 DIAGNOSIS — I2510 Atherosclerotic heart disease of native coronary artery without angina pectoris: Secondary | ICD-10-CM | POA: Diagnosis present

## 2018-07-13 DIAGNOSIS — E860 Dehydration: Secondary | ICD-10-CM | POA: Diagnosis present

## 2018-07-13 DIAGNOSIS — R079 Chest pain, unspecified: Secondary | ICD-10-CM | POA: Diagnosis not present

## 2018-07-13 DIAGNOSIS — N179 Acute kidney failure, unspecified: Secondary | ICD-10-CM | POA: Diagnosis present

## 2018-07-13 DIAGNOSIS — I499 Cardiac arrhythmia, unspecified: Secondary | ICD-10-CM | POA: Diagnosis not present

## 2018-07-13 DIAGNOSIS — R0902 Hypoxemia: Secondary | ICD-10-CM | POA: Diagnosis not present

## 2018-07-13 DIAGNOSIS — R000 Tachycardia, unspecified: Secondary | ICD-10-CM | POA: Diagnosis not present

## 2018-07-13 DIAGNOSIS — E875 Hyperkalemia: Secondary | ICD-10-CM | POA: Diagnosis present

## 2018-07-13 HISTORY — DX: Unspecified atrial fibrillation: I48.91

## 2018-07-13 LAB — COMPREHENSIVE METABOLIC PANEL
ALT: 24 U/L (ref 0–44)
AST: 56 U/L — AB (ref 15–41)
Albumin: 3.7 g/dL (ref 3.5–5.0)
Alkaline Phosphatase: 67 U/L (ref 38–126)
Anion gap: 14 (ref 5–15)
BILIRUBIN TOTAL: 1.8 mg/dL — AB (ref 0.3–1.2)
BUN: 27 mg/dL — ABNORMAL HIGH (ref 8–23)
CO2: 23 mmol/L (ref 22–32)
CREATININE: 1.74 mg/dL — AB (ref 0.61–1.24)
Calcium: 9.4 mg/dL (ref 8.9–10.3)
Chloride: 101 mmol/L (ref 98–111)
GFR calc Af Amer: 44 mL/min — ABNORMAL LOW (ref >60)
GFR, EST NON AFRICAN AMERICAN: 38 mL/min — AB (ref >60)
GLUCOSE: 101 mg/dL — AB (ref 70–99)
Potassium: 6.1 mmol/L — ABNORMAL HIGH (ref 3.5–5.1)
Sodium: 138 mmol/L (ref 135–145)
TOTAL PROTEIN: 6.2 g/dL — AB (ref 6.5–8.1)

## 2018-07-13 LAB — CBC WITH DIFFERENTIAL/PLATELET
Abs Immature Granulocytes: 0 10*3/uL (ref 0.0–0.1)
Basophils Absolute: 0.1 10*3/uL (ref 0.0–0.1)
Basophils Relative: 1 %
EOS PCT: 2 %
Eosinophils Absolute: 0.2 10*3/uL (ref 0.0–0.7)
HEMATOCRIT: 48.1 % (ref 39.0–52.0)
Hemoglobin: 15.3 g/dL (ref 13.0–17.0)
Immature Granulocytes: 0 %
LYMPHS ABS: 2.1 10*3/uL (ref 0.7–4.0)
Lymphocytes Relative: 20 %
MCH: 25.2 pg — ABNORMAL LOW (ref 26.0–34.0)
MCHC: 31.8 g/dL (ref 30.0–36.0)
MCV: 79.4 fL (ref 78.0–100.0)
Monocytes Absolute: 0.8 10*3/uL (ref 0.1–1.0)
Monocytes Relative: 8 %
Neutro Abs: 7.2 10*3/uL (ref 1.7–7.7)
Neutrophils Relative %: 69 %
Platelets: 197 10*3/uL (ref 150–400)
RBC: 6.06 MIL/uL — AB (ref 4.22–5.81)
RDW: 20.8 % — ABNORMAL HIGH (ref 11.5–15.5)
WBC: 10.3 10*3/uL (ref 4.0–10.5)

## 2018-07-13 LAB — I-STAT TROPONIN, ED
Troponin i, poc: 0.03 ng/mL (ref 0.00–0.08)
Troponin i, poc: 0.1 ng/mL (ref 0.00–0.08)

## 2018-07-13 LAB — MAGNESIUM: Magnesium: 2 mg/dL (ref 1.7–2.4)

## 2018-07-13 MED ORDER — RIVAROXABAN 20 MG PO TABS
20.0000 mg | ORAL_TABLET | Freq: Every evening | ORAL | Status: DC
  Administered 2018-07-13: 20 mg via ORAL
  Filled 2018-07-13: qty 1

## 2018-07-13 MED ORDER — FOLIC ACID 400 MCG PO TABS: 400.0000 ug | ORAL_TABLET | Freq: Two times a day (BID) | ORAL | Status: DC

## 2018-07-13 MED ORDER — ALLOPURINOL 100 MG PO TABS
200.0000 mg | ORAL_TABLET | Freq: Every day | ORAL | Status: DC
  Administered 2018-07-14: 200 mg via ORAL
  Filled 2018-07-13: qty 2

## 2018-07-13 MED ORDER — SODIUM POLYSTYRENE SULFONATE 15 GM/60ML PO SUSP
30.0000 g | Freq: Once | ORAL | Status: AC
Start: 2018-07-13 — End: 2018-07-13
  Administered 2018-07-13: 30 g via ORAL
  Filled 2018-07-13: qty 120

## 2018-07-13 MED ORDER — ACETAMINOPHEN 500 MG PO TABS: 1000.0000 mg | ORAL_TABLET | Freq: Four times a day (QID) | ORAL | Status: DC | PRN

## 2018-07-13 MED ORDER — HYPROMELLOSE (GONIOSCOPIC) 2.5 % OP SOLN
1.0000 [drp] | Freq: Four times a day (QID) | OPHTHALMIC | Status: DC
  Administered 2018-07-13 – 2018-07-14 (×2): 1 [drp] via OPHTHALMIC
  Filled 2018-07-13: qty 15

## 2018-07-13 MED ORDER — DILTIAZEM LOAD VIA INFUSION
10.0000 mg | Freq: Once | INTRAVENOUS | Status: AC
  Administered 2018-07-13: 10 mg via INTRAVENOUS
  Filled 2018-07-13: qty 10

## 2018-07-13 MED ORDER — FOLIC ACID 1 MG PO TABS
0.5000 mg | ORAL_TABLET | Freq: Two times a day (BID) | ORAL | Status: DC
  Administered 2018-07-13 – 2018-07-14 (×2): 0.5 mg via ORAL
  Filled 2018-07-13 (×2): qty 1

## 2018-07-13 MED ORDER — DILTIAZEM HCL-DEXTROSE 100-5 MG/100ML-% IV SOLN (PREMIX)
5.0000 mg/h | INTRAVENOUS | Status: DC
  Administered 2018-07-13: 5 mg/h via INTRAVENOUS
  Administered 2018-07-13: 10 mg/h via INTRAVENOUS
  Filled 2018-07-13: qty 100

## 2018-07-13 MED ORDER — B COMPLEX VITAMINS PO CAPS: 1.0000 | ORAL_CAPSULE | Freq: Every day | ORAL | Status: DC

## 2018-07-13 MED ORDER — ASPIRIN 81 MG PO CHEW
81.0000 mg | CHEWABLE_TABLET | Freq: Every day | ORAL | Status: DC
  Administered 2018-07-14: 81 mg via ORAL
  Filled 2018-07-13: qty 1

## 2018-07-13 MED ORDER — DRY EYE OMEGA BENEFITS/VIT D-3 667-250 MG-UNIT PO CAPS: ORAL_CAPSULE | Freq: Two times a day (BID) | ORAL | Status: DC

## 2018-07-13 MED ORDER — PAROXETINE HCL 10 MG PO TABS
10.0000 mg | ORAL_TABLET | ORAL | Status: DC
  Administered 2018-07-13: 10 mg via ORAL
  Filled 2018-07-13: qty 1

## 2018-07-13 MED ORDER — ALBUTEROL SULFATE (2.5 MG/3ML) 0.083% IN NEBU
10.0000 mg | INHALATION_SOLUTION | Freq: Once | RESPIRATORY_TRACT | Status: AC
  Administered 2018-07-13: 10 mg via RESPIRATORY_TRACT
  Filled 2018-07-13: qty 12

## 2018-07-13 MED ORDER — DILTIAZEM HCL 60 MG PO TABS
30.0000 mg | ORAL_TABLET | Freq: Four times a day (QID) | ORAL | Status: DC
  Administered 2018-07-14: 30 mg via ORAL
  Filled 2018-07-13: qty 1

## 2018-07-13 MED ORDER — B COMPLEX-C PO TABS
1.0000 | ORAL_TABLET | Freq: Every day | ORAL | Status: DC
  Administered 2018-07-14: 1 via ORAL
  Filled 2018-07-13: qty 1

## 2018-07-13 MED ORDER — ONDANSETRON HCL 4 MG/2ML IJ SOLN: 4.0000 mg | Freq: Four times a day (QID) | INTRAMUSCULAR | Status: DC | PRN

## 2018-07-13 MED ORDER — IRBESARTAN 150 MG PO TABS: 150.0000 mg | ORAL_TABLET | Freq: Every day | ORAL | Status: DC

## 2018-07-13 MED ORDER — CHLORTHALIDONE 25 MG PO TABS
12.5000 mg | ORAL_TABLET | Freq: Every day | ORAL | Status: DC
  Administered 2018-07-14: 12.5 mg via ORAL
  Filled 2018-07-13: qty 0.5

## 2018-07-13 MED ORDER — VITAMIN D 1000 UNITS PO TABS
1000.0000 [IU] | ORAL_TABLET | Freq: Every day | ORAL | Status: DC
  Administered 2018-07-14: 1000 [IU] via ORAL
  Filled 2018-07-13: qty 1

## 2018-07-13 MED ORDER — LORATADINE 10 MG PO TABS: 10.0000 mg | ORAL_TABLET | Freq: Every day | ORAL | Status: DC | PRN

## 2018-07-13 MED ORDER — SODIUM CHLORIDE 0.9 % IV BOLUS
1000.0000 mL | Freq: Once | INTRAVENOUS | Status: AC
Start: 2018-07-13 — End: 2018-07-13
  Administered 2018-07-13: 1000 mL via INTRAVENOUS

## 2018-07-13 MED ORDER — NITROGLYCERIN 0.4 MG SL SUBL: 0.4000 mg | SUBLINGUAL_TABLET | SUBLINGUAL | Status: DC | PRN

## 2018-07-13 MED ORDER — ASPIRIN 81 MG PO TABS: 81.0000 mg | ORAL_TABLET | Freq: Every day | ORAL | Status: DC

## 2018-07-13 MED ORDER — SODIUM CHLORIDE 0.9 % IV BOLUS
1000.0000 mL | Freq: Once | INTRAVENOUS | Status: AC
  Administered 2018-07-13: 1000 mL via INTRAVENOUS

## 2018-07-13 MED ORDER — PANTOPRAZOLE SODIUM 40 MG PO TBEC
40.0000 mg | DELAYED_RELEASE_TABLET | Freq: Every day | ORAL | Status: DC
  Administered 2018-07-14: 40 mg via ORAL
  Filled 2018-07-13: qty 1

## 2018-07-13 MED ORDER — ATORVASTATIN CALCIUM 80 MG PO TABS: 80.0000 mg | ORAL_TABLET | Freq: Every day | ORAL | Status: DC

## 2018-07-13 MED ORDER — ACETAMINOPHEN 325 MG PO TABS: 650.0000 mg | ORAL_TABLET | ORAL | Status: DC | PRN

## 2018-07-13 MED ORDER — PHENAZOPYRIDINE HCL 200 MG PO TABS
200.0000 mg | ORAL_TABLET | Freq: Three times a day (TID) | ORAL | Status: DC
  Filled 2018-07-13 (×3): qty 1

## 2018-07-13 MED ORDER — COLCHICINE 0.6 MG PO TABS: 0.6000 mg | ORAL_TABLET | ORAL | Status: DC | PRN

## 2018-07-13 MED ORDER — SODIUM CHLORIDE 0.9 % IV SOLN
1.0000 g | Freq: Once | INTRAVENOUS | Status: AC
  Administered 2018-07-13: 1 g via INTRAVENOUS
  Filled 2018-07-13: qty 10

## 2018-07-13 MED ORDER — FAMOTIDINE 20 MG PO TABS
20.0000 mg | ORAL_TABLET | Freq: Two times a day (BID) | ORAL | Status: DC
  Administered 2018-07-13: 20 mg via ORAL
  Filled 2018-07-13 (×2): qty 1

## 2018-07-13 NOTE — ED Triage Notes (Signed)
Per [facility] pt coming from home states he has been working in the yard and started to feel like his heart was beating fast, wife checked vitals and called EMS. HR 160s. MD at bedside. Patient alert and orientated.

## 2018-07-13 NOTE — ED Provider Notes (Signed)
[facility] EMERGENCY DEPARTMENT Provider Note   CSN: [id number] Arrival date & time: 07/13/18  1622     History   Chief Complaint No chief complaint on file.   HPI Jose Stone is a 72 y.o. male.  The history is provided by the patient and medical records. No language interpreter was used.  Chest Pain   This is a new problem. The current episode started 1 to 2 hours ago. The problem occurs constantly. The problem has not changed since onset.The pain is present in the lateral region. The pain is moderate. The quality of the pain is described as dull. The pain radiates to the right shoulder. Associated symptoms include diaphoresis, irregular heartbeat and malaise/fatigue. Pertinent negatives include no abdominal pain, no back pain, no cough, no dizziness, no exertional chest pressure, no fever, no headaches, no hemoptysis, no nausea, no near-syncope, no numbness, no palpitations, no shortness of breath, no syncope and no vomiting. He has tried nothing for the symptoms. The treatment provided no relief.  His past medical history is significant for arrhythmia, CAD and MI.    Past Medical History:  Diagnosis Date  . Atrial fibrillation ([place])   . CAD (coronary artery disease)   . GERD (gastroesophageal reflux disease)   . Hyperlipidemia    VSD  . Hypertension     Patient Active Problem List   Diagnosis Date Noted  . Low testosterone in male 01/15/2018  . Chest pain 09/03/2017  . Abnormal nuclear stress test 09/03/2017  . Dizziness 07/21/2017  . PAF (paroxysmal atrial fibrillation) ([place]) 07/21/2017  . Aortic valve regurgitation 03/31/2017  . Dilated aortic root ([place]) 03/31/2017  . Symptomatic bradycardia 06/20/2016  . RBBB (right bundle branch block) 06/20/2016  . OSA on CPAP 04/17/2016  . Excessive daytime sleepiness 04/17/2016  . Essential hypertension 11/16/2014  . Hyperlipidemia 11/16/2014  . GERD (gastroesophageal reflux disease) 11/16/2014  .  Coronary artery disease due to lipid rich plaque 11/16/2014  . VSD (ventricular septal defect) 11/16/2014  . Coronary artery disease involving native coronary artery of native heart without angina pectoris 11/16/2014    Past Surgical History:  Procedure Laterality Date  . CARDIAC CATHETERIZATION    . LEFT HEART CATHETERIZATION WITH CORONARY ANGIOGRAM N/A 08/14/2014   Procedure: LEFT HEART CATHETERIZATION WITH CORONARY ANGIOGRAM;  Surgeon: Birdie Riddle, MD;  Location: [facility];  Service: Cardiovascular;  Laterality: N/A;  . SHOULDER SURGERY    . TONSILLECTOMY  1955        Home Medications    Prior to Admission medications   Medication Sig Start Date End Date Taking? Authorizing Provider  acetaminophen (TYLENOL) 500 MG tablet Take 1,000 mg by mouth every 6 (six) hours as needed for mild pain or headache.    [provider]  allopurinol (ZYLOPRIM) 100 MG tablet Take 200 mg by mouth daily. 03/26/18   [provider]  aspirin 81 MG tablet Take 81 mg by mouth daily.    [provider]  atorvastatin (LIPITOR) 80 MG tablet Take 1 tablet (80 mg total) by mouth daily at 6 PM. 11/16/17   Hilty, Nadean Corwin, MD  b complex vitamins capsule Take 1 capsule by mouth daily.    [provider]  cefdinir (OMNICEF) 300 MG capsule Take 300 mg by mouth 2 (two) times daily. 05/10/18   [provider]  cephALEXin (KEFLEX) 500 MG capsule Take 1 capsule (500 mg total) by mouth 3 (three) times daily. 04/13/18   Trula Slade,  DPM  chlorthalidone (HYGROTON) 25 MG tablet TAKE 1/2 TABLET DAILY 04/02/18   Hilty, Nadean Corwin, MD  cholecalciferol (VITAMIN D) 1000 UNITS tablet Take 1,000 Units by mouth daily.     [provider]  COLCRYS 0.6 MG tablet Take 0.6 mg by mouth as needed (FOR GOUT).  05/26/14   [provider]  folic acid (FOLVITE) 222 MCG tablet Take 400 mcg by mouth 2 (two) times daily.     [provider]  hydroxypropyl  methylcellulose / hypromellose (ISOPTO TEARS / GONIOVISC) 2.5 % ophthalmic solution Place 1 drop into both eyes 4 (four) times daily.    [provider]  loratadine (CLARITIN) 10 MG tablet Take 10 mg by mouth daily as needed for allergies.    [provider]  nitroGLYCERIN (NITROSTAT) 0.4 MG SL tablet Place 1 tablet (0.4 mg total) under the tongue every 5 (five) minutes as needed for chest pain. Max 3 doses. 07/08/17   Hilty, Nadean Corwin, MD  Omega-3 Fat Ac-Cholecalciferol (DRY EYE OMEGA BENEFITS/VIT D-3 PO) Take 2 capsules by mouth 2 (two) times daily. Patient takes 4 daily, spreads them out throughout the day     [provider]  PARoxetine (PAXIL) 20 MG tablet Take 0.5 tablets (10 mg total) by mouth every other day. 05/31/15   Hilty, Nadean Corwin, MD  phenazopyridine (PYRIDIUM) 200 MG tablet Take 200 mg by mouth 3 (three) times daily as needed. 05/05/18   [provider]  ranitidine (ZANTAC) 150 MG tablet Take 150 mg by mouth 2 (two) times daily.    [provider]  testosterone cypionate (DEPOTESTOSTERONE CYPIONATE) 200 MG/ML injection INJECT 1ML SUBCUTANEOUSLY EVERY 2 WEEKS 02/23/18   [provider]  valsartan (DIOVAN) 160 MG tablet Take 1 tablet (160mg ) by mouth in the morning and 1/2 tablet (80mg ) by mouth in the evening 06/03/18   Hilty, Nadean Corwin, MD  XARELTO 20 MG TABS tablet TAKE 1 TABLET BY MOUTH EVERY DAY WITH SUPPER 05/03/18   Hilty, Nadean Corwin, MD    Family History Family History  Problem Relation Age of Onset  . Pancreatic cancer Mother   . Aortic aneurysm Maternal Grandmother   . Heart attack Maternal Grandfather   . Diabetes Paternal Grandfather   . CAD Paternal Grandfather     Social History Social History   Tobacco Use  . Smoking status: Never Smoker  . Smokeless tobacco: Never Used  Substance Use Topics  . Alcohol use: No  . Drug use: No     Allergies   Neosporin [neomycin-bacitracin zn-polymyx]; Percocet  [oxycodone-acetaminophen]; and Vicodin [hydrocodone-acetaminophen]   Review of Systems Review of Systems  Constitutional: Positive for diaphoresis, fatigue and malaise/fatigue. Negative for chills and fever.  HENT: Negative for congestion.   Respiratory: Negative for cough, hemoptysis, shortness of breath and wheezing.   Cardiovascular: Positive for chest pain. Negative for palpitations, leg swelling, syncope and near-syncope.  Gastrointestinal: Negative for abdominal pain, constipation, diarrhea, nausea and vomiting.  Genitourinary: Negative for flank pain.  Musculoskeletal: Negative for back pain, neck pain and neck stiffness.  Skin: Negative for rash and wound.  Neurological: Positive for light-headedness. Negative for dizziness, syncope, numbness and headaches.  Psychiatric/Behavioral: Negative for agitation, behavioral problems and confusion.  All other systems reviewed and are negative.    Physical Exam Updated Vital Signs BP 104/64 (BP Location: Right Arm)   Pulse (!) 150   Resp 16   SpO2 97%   Physical Exam  Constitutional: He is oriented to person, place,  and time. He appears well-developed and well-nourished. No distress.  HENT:  Head: Normocephalic and atraumatic.  Eyes: Pupils are equal, round, and reactive to light. Conjunctivae and EOM are normal.  Neck: Neck supple.  Cardiovascular: Intact distal pulses. An irregularly irregular rhythm present. Tachycardia present.  Murmur heard. Initial EKG shows A. fib with RVR.  Rate in the 150s to 160s.  Pulmonary/Chest: Effort normal and breath sounds normal. No respiratory distress. He has no wheezes. He has no rales. He exhibits no tenderness.  Abdominal: Soft. He exhibits no distension. There is no tenderness.  Musculoskeletal: He exhibits no edema or tenderness.  Neurological: He is alert and oriented to person, place, and time. No sensory deficit. He exhibits normal muscle tone.  Skin: Skin is warm and dry. He is not  diaphoretic.  Psychiatric: He has a normal mood and affect.  Nursing note and vitals reviewed.    ED Treatments / Results  Labs (all labs ordered are listed, but only abnormal results are displayed) Labs Reviewed  CBC WITH DIFFERENTIAL/PLATELET - Abnormal; Notable for the following components:      Result Value   RBC 6.06 (*)    MCH 25.2 (*)    RDW 20.8 (*)    All other components within normal limits  COMPREHENSIVE METABOLIC PANEL - Abnormal; Notable for the following components:   Potassium 6.1 (*)    Glucose, Bld 101 (*)    BUN 27 (*)    Creatinine, Ser 1.74 (*)    Total Protein 6.2 (*)    AST 56 (*)    Total Bilirubin 1.8 (*)    GFR calc non Af Amer 38 (*)    GFR calc Af Amer 44 (*)    All other components within normal limits  I-STAT TROPONIN, ED - Abnormal; Notable for the following components:   Troponin i, poc 0.10 (*)    All other components within normal limits  MAGNESIUM  HIV ANTIBODY (ROUTINE TESTING)  BASIC METABOLIC PANEL  CBC  I-STAT TROPONIN, ED    EKG EKG Interpretation  Date/Time:  Tuesday July 13 2018 16:25:07 EDT Ventricular Rate:  164 PR Interval:    QRS Duration: 111 QT Interval:  303 QTC Calculation: 501 R Axis:   -27 Text Interpretation:  Atrial fibrillation with rapid V-rate Ventricular premature complex Borderline left axis deviation Abnormal R-wave progression, early transition Repolarization abnormality, prob rate related Baseline wander in lead(s) V3 When compared to prior, recurrent Afib wigth RVR.  No STEMI Confirmed by Antony Blackbird [phone]) on 07/13/2018 4:33:35 PM   Radiology Dg Chest Portable 1 View  Result Date: 07/13/2018 CLINICAL DATA:  Chest pain and tachycardia. EXAM: PORTABLE CHEST 1 VIEW COMPARISON:  Chest x-ray dated Mar 06, 2018. FINDINGS: The heart size and mediastinal contours are within normal limits. Normal pulmonary vascularity. Stable subsegmental scarring/atelectasis at the lung bases. No focal consolidation,  pleural effusion, or pneumothorax. No acute osseous abnormality. Old right-sided rib fractures again noted. IMPRESSION: No active disease. Electronically Signed   By: Titus Dubin M.D.   On: 07/13/2018 16:57    Procedures Procedures (including critical care time)  CRITICAL CARE Performed by: Gwenyth Allegra Lyfe Reihl Total critical care time: 35 minutes Critical care time was exclusive of separately billable procedures and treating other patients. A. fib with RVR requiring diltiazem drip.  Positive troponin. Critical care was necessary to treat or prevent imminent or life-threatening deterioration. Critical care was time spent personally by me on the following activities: development of treatment plan with  patient and/or surrogate as well as nursing, discussions with consultants, evaluation of patient's response to treatment, examination of patient, obtaining history from patient or surrogate, ordering and performing treatments and interventions, ordering and review of laboratory studies, ordering and review of radiographic studies, pulse oximetry and re-evaluation of patient's condition.   Medications Ordered in ED Medications  diltiazem (CARDIZEM) 1 mg/mL load via infusion 10 mg (10 mg Intravenous Bolus from Bag 07/13/18 1815)    And  diltiazem (CARDIZEM) 100 mg in dextrose 5% 149mL (1 mg/mL) infusion (10 mg/hr Intravenous New Bag/Given 07/13/18 1927)  colchicine tablet 0.6 mg (has no administration in time range)  cholecalciferol (VITAMIN D) tablet 1,000 Units (has no administration in time range)  hydroxypropyl methylcellulose / hypromellose (ISOPTO TEARS / GONIOVISC) 2.5 % ophthalmic solution 1 drop (has no administration in time range)  PARoxetine (PAXIL) tablet 10 mg (has no administration in time range)  loratadine (CLARITIN) tablet 10 mg (has no administration in time range)  famotidine (PEPCID) tablet 20 mg (has no administration in time range)  nitroGLYCERIN (NITROSTAT) SL tablet  0.4 mg (has no administration in time range)  acetaminophen (TYLENOL) tablet 1,000 mg (has no administration in time range)  atorvastatin (LIPITOR) tablet 80 mg (has no administration in time range)  chlorthalidone (HYGROTON) tablet 12.5 mg (has no administration in time range)  allopurinol (ZYLOPRIM) tablet 200 mg (has no administration in time range)  rivaroxaban (XARELTO) tablet 20 mg (has no administration in time range)  phenazopyridine (PYRIDIUM) tablet 200 mg (has no administration in time range)  irbesartan (AVAPRO) tablet 150 mg (has no administration in time range)  pantoprazole (PROTONIX) EC tablet 40 mg (has no administration in time range)  acetaminophen (TYLENOL) tablet 650 mg (has no administration in time range)  ondansetron (ZOFRAN) injection 4 mg (has no administration in time range)  folic acid (FOLVITE) tablet 0.5 mg (has no administration in time range)  B-complex with vitamin C tablet 1 tablet (has no administration in time range)  aspirin chewable tablet 81 mg (has no administration in time range)  sodium chloride 0.9 % bolus 1,000 mL (0 mLs Intravenous Stopped 07/13/18 1822)  sodium chloride 0.9 % bolus 1,000 mL (0 mLs Intravenous Stopped 07/13/18 1923)  calcium gluconate 1 g in sodium chloride 0.9 % 100 mL IVPB (0 g Intravenous Stopped 07/13/18 1853)  albuterol (PROVENTIL) (2.5 MG/3ML) 0.083% nebulizer solution 10 mg (10 mg Nebulization Given 07/13/18 1753)  sodium polystyrene (KAYEXALATE) 15 GM/60ML suspension 30 g (30 g Oral Given 07/13/18 1858)     Initial Impression / Assessment and Plan / ED Course  I have reviewed the triage vital signs and the nursing notes.  Pertinent labs & imaging results that were available during my care of the patient were reviewed by me and considered in my medical decision making (see chart for details).     Jose Stone is a 72 y.o. male with a past medical history significant for CAD status post PCI, VSD, hypertension, hyperlipidemia,  GERD, aortic regurgitation, paroxysmal atrial fibrillation on Xarelto therapy who presents with lightheadedness, right-sided chest discomfort, and tachycardia.  Patient says that he thinks he was working outside today and got dehydrated not drinking enough fluids.  He says that while going home at around 2:30 PM, he started feeling lightheaded and felt some mild right chest and right shoulder pain.  He reports no significant cough congestion or shortness of breath but reports feeling his pulse and it was very fast.  Patient's wife is a former  nurse and thought that he was likely in A. fib with RVR again.  Patient called EMS and patient was brought in for evaluation.  According to patient, his blood pressure with his wife was 60 systolic however with EMS it is been over 115 systolic.  Patient was started on fluids with EMS.  Currently, patient denies any chest pain or shortness of breath.  He just feels lightheaded.  Patient reports his blood pressure is normally around 726 systolic.  He denies any recent medication changes and reports that he has been taking Xarelto as directed.  No other complaints including no recent fevers, chills, congestion, cough, urinary symptoms or GI symptoms.  Initial EKG shows A. fib with RVR with a rate of around 150-160.  He reports he has been in a flutter before and suspect he is in the A. fib/a flutter with RVR.    Patient denies palpitations which is slightly different than previous episodes however patient may be a candidate for ED cardioversion today.  Patient will have screen laboratory testing chest x-ray and be given fluids initially.  Anticipate reassessment in touch base with cardiology after work-up to discuss possible cardioversion.  6:09 PM Patient's laboratory testing returned showing evidence of acute kidney injury and hyperkalemia with potassium of 6.1.  This all appears to be new changes from prior.    Patient was started on albuterol for the hyperkalemia as  well as calcium gluconate.    Cardiology was called and after speaking with Dr. [name], the patient's cardiologist, they agree that cardioversion in the ED is likely not the best option given the potassium abnormalities and acute kidney injury.  Cardiology recommended initiation of diltiazem bolus and drip and they will see the patient in consultation.  They recommended admission to medicine for rehydration and electrolyte management and they will discuss further cardioversion plans.  Patient's blood pressure was primarily in the 90s and low 203 systolic.  He reports he typically runs around 559 systolic.  Fluid will be continued.  Medicine team called for admission and diltiazem was ordered.   Final Clinical Impressions(s) / ED Diagnoses   Final diagnoses:  Atrial fibrillation with RVR ([facility])  Lightheaded  AKI (acute kidney injury) ([place])  Hyperkalemia    ED Discharge Orders    None      Clinical Impression: 1. Atrial fibrillation with RVR ([place])   2. Lightheaded   3. AKI (acute kidney injury) ([place])   4. Hyperkalemia     Disposition: Admit  This note was prepared with assistance of Dragon voice recognition software. Occasional wrong-word or sound-a-like substitutions may have occurred due to the inherent limitations of voice recognition software.     Brecklyn Galvis, Gwenyth Allegra, MD 07/13/18 2236

## 2018-07-13 NOTE — H&P (Signed)
History and Physical   Jose Stone JJK:[id number] DOB: 1946/03/21 DOA: 07/13/2018  Referring MD/NP/PA: Dr. Sherry Ruffing  PCP: Kathyrn Lass, MD   Outpatient Specialists: Dr. Guy Sandifer cardiology  Patient coming from: Home  Chief Complaint: Generalized weakness with if  HPI: Jose Stone is a 72 y.o. male with medical history significant of coronary artery disease, atrial fibrillation with recurrent RVR, congenital heart disease with restrictive VSD multiple cardiac cath, hypertension, hyperlipidemia and family history of coronary artery disease with last documented ejection fraction of 70% previously known 81 to 70% LAD stenosis who came to the ER with generalized weakness and A. fib with RVR.  Patient is on chronic anticoagulation.  He has had previous synchronous cardioversion.  He came back to the ER today with another episode of atrial fibrillation with rapid ventricular response.  Mild chest pain and some shortness of breath.  His cardiologist was consulted and saw the patient.  Patient however has acute kidney injury with hyperkalemia so could not get cardioverted.  He still has rate in the 130s to 140s.  Cardizem drip has been initiated.  Patient is being admitted to medical service with cardiology follow-up..  ED Course: His temperature is 98.7 with blood pressure 87/68 initially heart rate in the 150s respiratory 27 and oxygen sat 92% room air.  His lab work include a white count of 10.3 hemoglobin 15.3 and platelet 197.  Sodium 138 potassium 6.1 BUN 27 and creatinine 1.74 glucose 101.  Patient was given bicarb as well as Kayexalate in the ER.  Evaluated with his EKG showing atrial fibrillation with rapid response.  Cardizem drip has been started.  Urology has been consulted and patient is being admitted to the hospital for evaluation.  Review of Systems: As per HPI otherwise 10 point review of systems negative.    Past Medical History:  Diagnosis Date  . Atrial fibrillation ([place])   . CAD  (coronary artery disease)   . GERD (gastroesophageal reflux disease)   . Hyperlipidemia    VSD  . Hypertension     Past Surgical History:  Procedure Laterality Date  . CARDIAC CATHETERIZATION    . LEFT HEART CATHETERIZATION WITH CORONARY ANGIOGRAM N/A 08/14/2014   Procedure: LEFT HEART CATHETERIZATION WITH CORONARY ANGIOGRAM;  Surgeon: Birdie Riddle, MD;  Location: [facility];  Service: Cardiovascular;  Laterality: N/A;  . SHOULDER SURGERY    . TONSILLECTOMY  1955     reports that he has never smoked. He has never used smokeless tobacco. He reports that he does not drink alcohol or use drugs.  Allergies  Allergen Reactions  . Neosporin [Neomycin-Bacitracin Zn-Polymyx] Rash  . Percocet [Oxycodone-Acetaminophen] Rash    Patient can tolerate acetaminophen solely  . Vicodin [Hydrocodone-Acetaminophen] Rash    Patient can tolerate acetaminophen solely    Family History  Problem Relation Age of Onset  . Pancreatic cancer Mother   . Aortic aneurysm Maternal Grandmother   . Heart attack Maternal Grandfather   . Diabetes Paternal Grandfather   . CAD Paternal Grandfather      Prior to Admission medications   Medication Sig Start Date End Date Taking? Authorizing Provider  acetaminophen (TYLENOL) 500 MG tablet Take 1,000 mg by mouth every 6 (six) hours as needed for mild pain or headache.   Yes [provider]  allopurinol (ZYLOPRIM) 100 MG tablet Take 200 mg by mouth daily. 03/26/18  Yes [provider]  aspirin 81 MG tablet Take 81 mg by mouth daily.   Yes [provider]  atorvastatin (LIPITOR) 80 MG tablet Take 1 tablet (80 mg total) by mouth daily at 6 PM. 11/16/17  Yes Hilty, Nadean Corwin, MD  b complex vitamins capsule Take 1 capsule by mouth daily.   Yes [provider]  chlorthalidone (HYGROTON) 25 MG tablet TAKE 1/2 TABLET DAILY Patient taking differently: Take 12.5 mg by mouth daily.  04/02/18  Yes Hilty, Nadean Corwin, MD  cholecalciferol  (VITAMIN D) 1000 UNITS tablet Take 1,000 Units by mouth daily.    Yes [provider]  COLCRYS 0.6 MG tablet Take 0.6 mg by mouth as needed (FOR GOUT).  05/26/14  Yes [provider]  folic acid (FOLVITE) 540 MCG tablet Take 400 mcg by mouth 2 (two) times daily.    Yes [provider]  hydroxypropyl methylcellulose / hypromellose (ISOPTO TEARS / GONIOVISC) 2.5 % ophthalmic solution Place 1 drop into both eyes 4 (four) times daily.   Yes [provider]  loratadine (CLARITIN) 10 MG tablet Take 10 mg by mouth daily as needed for allergies.   Yes [provider]  nitroGLYCERIN (NITROSTAT) 0.4 MG SL tablet Place 1 tablet (0.4 mg total) under the tongue every 5 (five) minutes as needed for chest pain. Max 3 doses. 07/08/17  Yes Hilty, Nadean Corwin, MD  Omega-3 Fat Ac-Cholecalciferol (DRY EYE OMEGA BENEFITS/VIT D-3 PO) Take 2 capsules by mouth 2 (two) times daily. Patient takes 4 daily, spreads them out throughout the day    Yes [provider]  omeprazole (PRILOSEC) 40 MG capsule Take 40 mg by mouth every morning. 30-60 minutes prior to breakfast. 06/22/18  Yes [provider]  PARoxetine (PAXIL) 20 MG tablet Take 0.5 tablets (10 mg total) by mouth every other day. 05/31/15  Yes Hilty, Nadean Corwin, MD  phenazopyridine (PYRIDIUM) 200 MG tablet Take 200 mg by mouth 3 (three) times daily as needed. 05/05/18  Yes [provider]  ranitidine (ZANTAC) 150 MG tablet Take 150 mg by mouth every evening.    Yes [provider]  valsartan (DIOVAN) 160 MG tablet Take 1 tablet (160mg ) by mouth in the morning and 1/2 tablet (80mg ) by mouth in the evening 06/03/18  Yes Hilty, Nadean Corwin, MD  XARELTO 20 MG TABS tablet TAKE 1 TABLET BY MOUTH EVERY DAY WITH SUPPER Patient taking differently: Take 20 mg by mouth every evening.  05/03/18  Yes Hilty, Nadean Corwin, MD  testosterone cypionate (DEPOTESTOSTERONE CYPIONATE) 200 MG/ML injection INJECT 1ML SUBCUTANEOUSLY  EVERY 2 WEEKS 02/23/18   [provider]    Physical Exam: Vitals:   07/13/18 1900 07/13/18 1906 07/13/18 1921 07/13/18 1959  BP: 112/90     Pulse: (!) 145 (!) 118    Resp: 16 14    Temp:    98.7 F (37.1 C)  TempSrc:    Oral  SpO2: 98% 95% 94%   Weight:      Height:          Constitutional: NAD, calm, comfortable Vitals:   07/13/18 1900 07/13/18 1906 07/13/18 1921 07/13/18 1959  BP: 112/90     Pulse: (!) 145 (!) 118    Resp: 16 14    Temp:    98.7 F (37.1 C)  TempSrc:    Oral  SpO2: 98% 95% 94%   Weight:      Height:       Eyes: PERRL, lids and conjunctivae normal ENMT: Mucous membranes are moist. Posterior pharynx clear of any exudate or lesions.Normal dentition.  Neck:  normal, supple, no masses, no thyromegaly Respiratory: clear to auscultation bilaterally, no wheezing, no crackles. Normal respiratory effort. No accessory muscle use.  Cardiovascular: Irregularly irregular rhythm with tachycardia no extremity edema. 2+ pedal pulses. No carotid bruits.  Abdomen: no tenderness, no masses palpated. No hepatosplenomegaly. Bowel sounds positive.  Musculoskeletal: no clubbing / cyanosis. No joint deformity upper and lower extremities. Good ROM, no contractures. Normal muscle tone.  Skin: no rashes, lesions, ulcers. No induration Neurologic: CN 2-12 grossly intact. Sensation intact, DTR normal. Strength 5/5 in all 4.  Psychiatric: Normal judgment and insight. Alert and oriented x 3. Normal mood.     Labs on Admission: I have personally reviewed following labs and imaging studies  CBC: Recent Labs  Lab 07/13/18 1635  WBC 10.3  NEUTROABS 7.2  HGB 15.3  HCT 48.1  MCV 79.4  PLT 814   Basic Metabolic Panel: Recent Labs  Lab 07/13/18 1635  NA 138  K 6.1*  CL 101  CO2 23  GLUCOSE 101*  BUN 27*  CREATININE 1.74*  CALCIUM 9.4  MG 2.0   GFR: Estimated Creatinine Clearance: 51.1 mL/min (A) (by C-G formula based on SCr of 1.74 mg/dL (H)). Liver  Function Tests: Recent Labs  Lab 07/13/18 1635  AST 56*  ALT 24  ALKPHOS 67  BILITOT 1.8*  PROT 6.2*  ALBUMIN 3.7   No results for input(s): LIPASE, AMYLASE in the last 168 hours. No results for input(s): AMMONIA in the last 168 hours. Coagulation Profile: No results for input(s): INR, PROTIME in the last 168 hours. Cardiac Enzymes: No results for input(s): CKTOTAL, CKMB, CKMBINDEX, TROPONINI in the last 168 hours. BNP (last 3 results) No results for input(s): PROBNP in the last 8760 hours. HbA1C: No results for input(s): HGBA1C in the last 72 hours. CBG: No results for input(s): GLUCAP in the last 168 hours. Lipid Profile: No results for input(s): CHOL, HDL, LDLCALC, TRIG, CHOLHDL, LDLDIRECT in the last 72 hours. Thyroid Function Tests: No results for input(s): TSH, T4TOTAL, FREET4, T3FREE, THYROIDAB in the last 72 hours. Anemia Panel: No results for input(s): VITAMINB12, FOLATE, FERRITIN, TIBC, IRON, RETICCTPCT in the last 72 hours. Urine analysis: No results found for: COLORURINE, APPEARANCEUR, LABSPEC, PHURINE, GLUCOSEU, HGBUR, BILIRUBINUR, KETONESUR, PROTEINUR, UROBILINOGEN, NITRITE, LEUKOCYTESUR Sepsis Labs: @LABRCNTIP (procalcitonin:4,lacticidven:4) )No results found for this or any previous visit (from the past 240 hour(s)).   Radiological Exams on Admission: Dg Chest Portable 1 View  Result Date: 07/13/2018 CLINICAL DATA:  Chest pain and tachycardia. EXAM: PORTABLE CHEST 1 VIEW COMPARISON:  Chest x-ray dated Mar 06, 2018. FINDINGS: The heart size and mediastinal contours are within normal limits. Normal pulmonary vascularity. Stable subsegmental scarring/atelectasis at the lung bases. No focal consolidation, pleural effusion, or pneumothorax. No acute osseous abnormality. Old right-sided rib fractures again noted. IMPRESSION: No active disease. Electronically Signed   By: Titus Dubin M.D.   On: 07/13/2018 16:57    EKG: Independently reviewed.  It shows atrial  fibrillation with rapid ventricular response.  Rate of 164.  Early repolarization.  Assessment/Plan Principal Problem:   Atrial fibrillation with RVR (H[facility]) Active Problems:   Essential hypertension   Hyperlipidemia   GERD (gastroesophageal reflux disease)   Coronary artery disease due to lipid rich plaque   PAF (paroxysmal atrial fibrillation) (H[facility])   AKI (acute kidney injury) ([place])   Hyperkalemia     #1 atrial fibrillation with rapid ventricular response: Patient will be admitted to a monitored bed.  Cardizem drip on board.  Initiate oral Cardizem  and titrate off the drip as soon as possible.  Continue with his anticoagulation with Xarelto.  Continue care according to cardiology.  #2 acute kidney injury: Probably prerenal.  Hydrate patient and monitor BUN and creatinine closely.  #3 hyperkalemia: probably due to acute kidney injury.  Avoid potassium sparing medications.  #4 GERD: Continue with PPIs.  #5 hyperlipidemia: Continue with statin  #6 essential hypertension: Blood pressure is low at the moment.  Hold antihypertensives until blood pressure rebounds.  #7 coronary artery disease: Monitor troponins.  No evidence of acute coronary syndrome.  Monitor on telemetry.   DVT prophylaxis: Xarelto Code Status: Full code Family Communication: No family available Disposition Plan: Probably home Consults called: Dr. Lyman Bishop, cardiology Admission status: Inpatient  Severity of Illness: The appropriate patient status for this patient is INPATIENT. Inpatient status is judged to be reasonable and necessary in order to provide the required intensity of service to ensure the patient's safety. The patient's presenting symptoms, physical exam findings, and initial radiographic and laboratory data in the context of their chronic comorbidities is felt to place them at high risk for further clinical deterioration. Furthermore, it is not anticipated that the patient will be medically  stable for discharge from the hospital within 2 midnights of admission. The following factors support the patient status of inpatient.   " The patient's presenting symptoms include weakness and palpitation. " The worrisome physical exam findings include A. fib with RVR. " The initial radiographic and laboratory data are worrisome because of potassium 6.1 and creatinine 1.74. " The chronic co-morbidities include history of coronary artery disease with atrial fibrillation.   * I certify that at the point of admission it is my clinical judgment that the patient will require inpatient hospital care spanning beyond 2 midnights from the point of admission due to high intensity of service, high risk for further deterioration and high frequency of surveillance required.Barbette Merino MD [facility] Pager [phone]  If 7PM-7AM, please contact night-coverage [email or web address] Password [email or web address]  07/13/2018, 8:01 PM

## 2018-07-13 NOTE — Consult Note (Signed)
CONSULTATION NOTE   Patient Name: Jose Stone Date of Encounter: 07/13/2018 Cardiologist: [name], MD  Chief Complaint   Weakness, afib  Patient Profile   83 patient with history of afib, now presents with weakness and found to have AKI, hyperkalemia and afib with RVR.  HPI   Jose Stone is a 73 y.o. male who is being seen today for the evaluation of afib with RVR at the request of Dr. Cordelia Pen. 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 This is never caused issues in fact she's had right and left heart catheterizations dating back to the 1970s. 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 Other coronary risk factors include hypertension, dyslipidemia and family history of heart disease. He was sent for cardiac catheterization in 2006 for follow-up of VSD, at which time he had an echocardiogram which showed mild aortic regurgitation, a small membranous VSD and left atrial enlargement with an EF of 70%. He had a stress test at that time which showed findings concerning for him for a septal and inferoapical ischemia. He therefore was referred to left heart catheterization with strep demonstrated a 50-70% mid LAD stenosis and mild disease of the mid circumflex and first obtuse marginal branches. He's been managed medically since that time and previously saw Dr. Daneen Schick.  Recently he's been having some symptoms of sweating, palpitations and dizziness and eventually was found to be in atrial fibrillation when he presented to the hospital. At that time his troponin was elevated. He is placed on diltiazem which slowed his heart rate and he converted back to sinus spontaneously. Based on his elevated troponin, he was referred for left heart catheterization by Dr. Doylene Canard. This demonstrated a 60-70% mid LAD stenosis and a 90% obtuse marginal stenosis.  Subsequently he underwent coronary intervention with placement of a 2.5 x 15 mm Xience Alpine stent to the mid OM. After this he became asymptomatic. He was going to have stress testing to determine the significance of his mid LAD lesion however it appears to be not significantly changed since his heart catheterization 2006. More recently he has had recurrent atrial arryhthmias, including atrial flutter and fibrillation. He was seen in the ER in 2018 for chest pain which was atypical. He had a mildly abnormal stress test. Since then he has done well.  He now presents for AKI and hyperkalemia, complicated by atrial fibrillation with RVR. He has been compliant with Xarelto. He denies chest pain, but has felt weak and fatigued.  PMHx   Past Medical History:  Diagnosis Date  . Atrial fibrillation ([place])   . CAD (coronary artery disease)   . GERD (gastroesophageal reflux disease)   . Hyperlipidemia    VSD  . Hypertension     Past Surgical History:  Procedure Laterality Date  . CARDIAC CATHETERIZATION    . LEFT HEART CATHETERIZATION WITH CORONARY ANGIOGRAM N/A 08/14/2014   Procedure: LEFT HEART CATHETERIZATION WITH CORONARY ANGIOGRAM;  Surgeon: Birdie Riddle, MD;  Location: [facility];  Service: Cardiovascular;  Laterality: N/A;  . SHOULDER SURGERY    . TONSILLECTOMY  1955    FAMHx   Family History  Problem Relation Age of Onset  . Pancreatic cancer Mother   . Aortic aneurysm Maternal Grandmother   . Heart attack Maternal Grandfather   . Diabetes Paternal Grandfather   .  CAD Paternal Grandfather     SOCHx    reports that he has never smoked. He has never used smokeless tobacco. He reports that he does not drink alcohol or use drugs.  Outpatient Medications   No current facility-administered medications on file prior to encounter.    Current Outpatient Medications on File Prior to Encounter  Medication Sig Dispense Refill  . acetaminophen (TYLENOL) 500 MG tablet Take 1,000 mg  by mouth every 6 (six) hours as needed for mild pain or headache.    . allopurinol (ZYLOPRIM) 100 MG tablet Take 200 mg by mouth daily.  0  . aspirin 81 MG tablet Take 81 mg by mouth daily.    Marland Kitchen atorvastatin (LIPITOR) 80 MG tablet Take 1 tablet (80 mg total) by mouth daily at 6 PM. 90 tablet 3  . b complex vitamins capsule Take 1 capsule by mouth daily.    . chlorthalidone (HYGROTON) 25 MG tablet TAKE 1/2 TABLET DAILY (Patient taking differently: Take 12.5 mg by mouth daily. ) 90 tablet 0  . cholecalciferol (VITAMIN D) 1000 UNITS tablet Take 1,000 Units by mouth daily.     Marland Kitchen COLCRYS 0.6 MG tablet Take 0.6 mg by mouth as needed (FOR GOUT).     . folic acid (FOLVITE) 035 MCG tablet Take 400 mcg by mouth 2 (two) times daily.     . hydroxypropyl methylcellulose / hypromellose (ISOPTO TEARS / GONIOVISC) 2.5 % ophthalmic solution Place 1 drop into both eyes 4 (four) times daily.    Marland Kitchen loratadine (CLARITIN) 10 MG tablet Take 10 mg by mouth daily as needed for allergies.    . nitroGLYCERIN (NITROSTAT) 0.4 MG SL tablet Place 1 tablet (0.4 mg total) under the tongue every 5 (five) minutes as needed for chest pain. Max 3 doses. 25 tablet 5  . Omega-3 Fat Ac-Cholecalciferol (DRY EYE OMEGA BENEFITS/VIT D-3 PO) Take 2 capsules by mouth 2 (two) times daily. Patient takes 4 daily, spreads them out throughout the day     . omeprazole (PRILOSEC) 40 MG capsule Take 40 mg by mouth every morning. 30-60 minutes prior to breakfast.  3  . PARoxetine (PAXIL) 20 MG tablet Take 0.5 tablets (10 mg total) by mouth every other day. 30 tablet 0  . phenazopyridine (PYRIDIUM) 200 MG tablet Take 200 mg by mouth 3 (three) times daily as needed.  0  . ranitidine (ZANTAC) 150 MG tablet Take 150 mg by mouth every evening.     . valsartan (DIOVAN) 160 MG tablet Take 1 tablet (12m) by mouth in the morning and 1/2 tablet (842m by mouth in the evening 135 tablet 1  . XARELTO 20 MG TABS tablet TAKE 1 TABLET BY MOUTH EVERY DAY WITH SUPPER  (Patient taking differently: Take 20 mg by mouth every evening. ) 90 tablet 1  . testosterone cypionate (DEPOTESTOSTERONE CYPIONATE) 200 MG/ML injection INJECT 1ML SUBCUTANEOUSLY EVERY 2 WEEKS  1    Inpatient Medications    Scheduled Meds: . diltiazem  10 mg Intravenous Once  . sodium polystyrene  30 g Oral Once    Continuous Infusions: . calcium gluconate 1 GM IVPB 1 g (07/13/18 1823)  . diltiazem (CARDIZEM) infusion    . sodium chloride 1,000 mL (07/13/18 1822)    PRN Meds:    ALLERGIES   Allergies  Allergen Reactions  . Neosporin [Neomycin-Bacitracin Zn-Polymyx] Rash  . Percocet [Oxycodone-Acetaminophen] Rash    Patient can tolerate acetaminophen solely  . Vicodin [Hydrocodone-Acetaminophen] Rash    Patient can tolerate  acetaminophen solely    ROS   Pertinent items noted in HPI and remainder of comprehensive ROS otherwise negative.  Vitals   Vitals:   07/13/18 1703 07/13/18 1730 07/13/18 1754 07/13/18 1800  BP:  98/72  (!) 87/68  Pulse: 100 71  76  Resp: 14 15  (!) 21  SpO2: 96% 98% 97% 96%  Weight:      Height:       No intake or output data in the 24 hours ending 07/13/18 1831 Filed Weights   07/13/18 1645  Weight: 108.9 kg    Physical Exam   General appearance: alert and no distress Neck: no carotid bruit, no JVD and thyroid not enlarged, symmetric, no tenderness/mass/nodules Lungs: clear to auscultation bilaterally Heart: irregularly irregular rhythm and tachycardia Abdomen: soft, non-tender; bowel sounds normal; no masses,  no organomegaly Extremities: extremities normal, atraumatic, no cyanosis or edema Pulses: 2+ and symmetric Skin: Skin color, texture, turgor normal. No rashes or lesions Neurologic: Grossly normal Psych: [place]  Labs   Results for orders placed or performed during the hospital encounter of 07/13/18 (from the past 48 hour(s))  CBC with Differential     Status: Abnormal   Collection Time: 07/13/18  4:35 PM  Result  Value Ref Range   WBC 10.3 4.0 - 10.5 K/uL   RBC 6.06 (H) 4.22 - 5.81 MIL/uL   Hemoglobin 15.3 13.0 - 17.0 g/dL   HCT 48.1 39.0 - 52.0 %   MCV 79.4 78.0 - 100.0 fL   MCH 25.2 (L) 26.0 - 34.0 pg   MCHC 31.8 30.0 - 36.0 g/dL   RDW 20.8 (H) 11.5 - 15.5 %   Platelets 197 150 - 400 K/uL   Neutrophils Relative % 69 %   Neutro Abs 7.2 1.7 - 7.7 K/uL   Lymphocytes Relative 20 %   Lymphs Abs 2.1 0.7 - 4.0 K/uL   Monocytes Relative 8 %   Monocytes Absolute 0.8 0.1 - 1.0 K/uL   Eosinophils Relative 2 %   Eosinophils Absolute 0.2 0.0 - 0.7 K/uL   Basophils Relative 1 %   Basophils Absolute 0.1 0.0 - 0.1 K/uL   Immature Granulocytes 0 %   Abs Immature Granulocytes 0.0 0.0 - 0.1 K/uL    Comment: Performed at [facility], [address] [place], [place] [address]  Comprehensive metabolic panel     Status: Abnormal   Collection Time: 07/13/18  4:35 PM  Result Value Ref Range   Sodium 138 135 - 145 mmol/L   Potassium 6.1 (H) 3.5 - 5.1 mmol/L   Chloride 101 98 - 111 mmol/L   CO2 23 22 - 32 mmol/L   Glucose, Bld 101 (H) 70 - 99 mg/dL   BUN 27 (H) 8 - 23 mg/dL   Creatinine, Ser 1.74 (H) 0.61 - 1.24 mg/dL   Calcium 9.4 8.9 - 10.3 mg/dL   Total Protein 6.2 (L) 6.5 - 8.1 g/dL   Albumin 3.7 3.5 - 5.0 g/dL   AST 56 (H) 15 - 41 U/L   ALT 24 0 - 44 U/L   Alkaline Phosphatase 67 38 - 126 U/L   Total Bilirubin 1.8 (H) 0.3 - 1.2 mg/dL   GFR calc non Af Amer 38 (L) >60 mL/min   GFR calc Af Amer 44 (L) >60 mL/min    Comment: (NOTE) The eGFR has been calculated using the CKD EPI equation. This calculation has not been validated in all clinical situations. eGFR's persistently <60 mL/min signify possible  Chronic Kidney Disease.    Anion gap 14 5 - 15    Comment: Performed at [facility] [address] [place], [place] [address]  Magnesium     Status: None   Collection Time: 07/13/18  4:35 PM  Result Value Ref Range   Magnesium 2.0 1.7 - 2.4 mg/dL    Comment: Performed at [facility], [place] [address], [place], [place] [address]  I-stat troponin, ED     Status: None   Collection Time: 07/13/18  4:42 PM  Result Value Ref Range   Troponin i, poc 0.03 0.00 - 0.08 ng/mL   Comment 3            Comment: Due to the release kinetics of cTnI, a negative result within the first hours of the onset of symptoms does not rule out myocardial infarction with certainty. If myocardial infarction is still suspected, repeat the test at appropriate intervals.     ECG   Afib with RVR at 164 - Personally Reviewed  Telemetry   Afib with RVR - Personally Reviewed  Radiology   Dg Chest Portable 1 View  Result Date: 07/13/2018 CLINICAL DATA:  Chest pain and tachycardia. EXAM: PORTABLE CHEST 1 VIEW COMPARISON:  Chest x-ray dated Mar 06, 2018. FINDINGS: The heart size and mediastinal contours are within normal limits. Normal pulmonary vascularity. Stable subsegmental scarring/atelectasis at the lung bases. No focal consolidation, pleural effusion, or pneumothorax. No acute osseous abnormality. Old right-sided rib fractures again noted. IMPRESSION: No active disease. Electronically Signed   By: Titus Dubin M.D.   On: 07/13/2018 16:57    Cardiac Studies   N/A  Impression   Principal Problem:   AKI (acute kidney injury) ([place]) Active Problems:   PAF (paroxysmal atrial fibrillation) ([facility])   Hyperkalemia   Recommendation   1. Given presentation with AKI and hyperkalemia, would recommend medicine admission and further work-up. Hold diovan and chlorthalidone and likely hydrate. Agree with diltiazem for rate control and can transition to BB such as coreg for rate/BP control. Continue Xarelto - if he does not spontaneously convert with rate control then may consider inpatient DCCV.  Thanks for the consultation. Cardiology will follow with you.  Time Spent Directly with Patient:  I have spent a total of 45 minutes with the patient reviewing hospital notes, telemetry,  EKGs, labs and examining the patient as well as establishing an assessment and plan that was discussed personally with the patient.  > 50% of time was spent in direct patient care.  Length of Stay:  LOS: 0 days   [name], MD, [facility], [place] Director of the [facility] Lipid [facility] &  Cardiovascular [facility] Diplomate of the American Board of Clinical Lipidology Attending Cardiologist  Direct Dial: [phone]  Fax: [phone]  Website:  [name] 07/13/2018, 6:31 PM

## 2018-07-13 NOTE — Progress Notes (Signed)
Dr. Sherry Ruffing notified of pt's HR & wants to give Albuterol CAT with increased  HR.

## 2018-07-13 NOTE — ED Notes (Signed)
Dr. Jonelle Sidle made aware trop 0.10

## 2018-07-14 DIAGNOSIS — I10 Essential (primary) hypertension: Secondary | ICD-10-CM | POA: Diagnosis not present

## 2018-07-14 DIAGNOSIS — I4891 Unspecified atrial fibrillation: Secondary | ICD-10-CM | POA: Diagnosis not present

## 2018-07-14 DIAGNOSIS — E875 Hyperkalemia: Secondary | ICD-10-CM | POA: Diagnosis not present

## 2018-07-14 DIAGNOSIS — I480 Paroxysmal atrial fibrillation: Secondary | ICD-10-CM | POA: Diagnosis not present

## 2018-07-14 DIAGNOSIS — N179 Acute kidney failure, unspecified: Secondary | ICD-10-CM | POA: Diagnosis not present

## 2018-07-14 DIAGNOSIS — I2583 Coronary atherosclerosis due to lipid rich plaque: Secondary | ICD-10-CM

## 2018-07-14 DIAGNOSIS — I2510 Atherosclerotic heart disease of native coronary artery without angina pectoris: Secondary | ICD-10-CM | POA: Diagnosis not present

## 2018-07-14 DIAGNOSIS — R0789 Other chest pain: Secondary | ICD-10-CM | POA: Diagnosis not present

## 2018-07-14 DIAGNOSIS — E785 Hyperlipidemia, unspecified: Secondary | ICD-10-CM | POA: Diagnosis not present

## 2018-07-14 LAB — BASIC METABOLIC PANEL
ANION GAP: 9 (ref 5–15)
BUN: 24 mg/dL — ABNORMAL HIGH (ref 8–23)
CALCIUM: 8.6 mg/dL — AB (ref 8.9–10.3)
CO2: 26 mmol/L (ref 22–32)
CREATININE: 1.29 mg/dL — AB (ref 0.61–1.24)
Chloride: 105 mmol/L (ref 98–111)
GFR, EST NON AFRICAN AMERICAN: 55 mL/min — AB (ref >60)
GLUCOSE: 91 mg/dL (ref 70–99)
Potassium: 3.3 mmol/L — ABNORMAL LOW (ref 3.5–5.1)
Sodium: 140 mmol/L (ref 135–145)

## 2018-07-14 LAB — HIV ANTIBODY (ROUTINE TESTING W REFLEX): HIV Screen 4th Generation wRfx: NONREACTIVE

## 2018-07-14 LAB — CBC
HCT: 40.6 % (ref 39.0–52.0)
Hemoglobin: 13.2 g/dL (ref 13.0–17.0)
MCH: 25.3 pg — ABNORMAL LOW (ref 26.0–34.0)
MCHC: 32.5 g/dL (ref 30.0–36.0)
MCV: 77.9 fL — ABNORMAL LOW (ref 78.0–100.0)
Platelets: 180 10*3/uL (ref 150–400)
RBC: 5.21 MIL/uL (ref 4.22–5.81)
RDW: 20.3 % — ABNORMAL HIGH (ref 11.5–15.5)
WBC: 8.6 10*3/uL (ref 4.0–10.5)

## 2018-07-14 MED ORDER — IRBESARTAN 75 MG PO TABS
75.0000 mg | ORAL_TABLET | Freq: Every day | ORAL | Status: DC
  Administered 2018-07-14: 75 mg via ORAL
  Filled 2018-07-14: qty 1

## 2018-07-14 MED ORDER — DILTIAZEM HCL 60 MG PO TABS: 60.0000 mg | ORAL_TABLET | Freq: Four times a day (QID) | ORAL | Status: DC

## 2018-07-14 MED ORDER — POTASSIUM CHLORIDE CRYS ER 20 MEQ PO TBCR
40.0000 meq | EXTENDED_RELEASE_TABLET | Freq: Once | ORAL | Status: AC
  Administered 2018-07-14: 40 meq via ORAL
  Filled 2018-07-14: qty 2

## 2018-07-14 MED ORDER — VALSARTAN 160 MG PO TABS: 160.0000 mg | ORAL_TABLET | Freq: Every day | ORAL | 1 refills | Status: DC

## 2018-07-14 MED ORDER — DILTIAZEM HCL ER COATED BEADS 120 MG PO CP24: 120.0000 mg | ORAL_CAPSULE | Freq: Every day | ORAL | 11 refills | Status: DC

## 2018-07-14 NOTE — Progress Notes (Signed)
Progress Note  Patient Name: Jose Stone Date of Encounter: 07/14/2018  Primary Cardiologist: [name], MD   Subjective   Feels much better today. Weakness resolved. No palpitations with afib. In hindsight, he feels he was dehydrated. He was working out in the heat the last few days and not drinking enough fluids.   Inpatient Medications    Scheduled Meds: . allopurinol  200 mg Oral Daily  . aspirin  81 mg Oral Daily  . atorvastatin  80 mg Oral q1800  . B-complex with vitamin C  1 tablet Oral Daily  . chlorthalidone  12.5 mg Oral Daily  . cholecalciferol  1,000 Units Oral Daily  . diltiazem  30 mg Oral Q6H  . famotidine  20 mg Oral BID  . folic acid  0.5 mg Oral BID  . hydroxypropyl methylcellulose / hypromellose  1 drop Both Eyes QID  . irbesartan  150 mg Oral Daily  . pantoprazole  40 mg Oral Daily  . PARoxetine  10 mg Oral QODAY  . phenazopyridine  200 mg Oral TID WC  . rivaroxaban  20 mg Oral QPM   Continuous Infusions: . diltiazem (CARDIZEM) infusion Stopped (07/14/18 0123)   PRN Meds: acetaminophen, colchicine, loratadine, nitroGLYCERIN, ondansetron (ZOFRAN) IV   Vital Signs    Vitals:   07/13/18 2300 07/13/18 2337 07/13/18 2356 07/14/18 0511  BP: 96/63  133/65 (!) 120/57  Pulse: 63  (!) 59 (!) 56  Resp: 16  18 16   Temp:   98.4 F (36.9 C) 98.3 F (36.8 C)  TempSrc:   Oral Oral  SpO2: 97%  98% 98%  Weight:  109 kg    Height:        Intake/Output Summary (Last 24 hours) at 07/14/2018 0705 Last data filed at 07/14/2018 0130 Gross per 24 hour  Intake 1301.8 ml  Output -  Net 1301.8 ml   Filed Weights   07/13/18 1645 07/13/18 2337  Weight: 108.9 kg 109 kg    Telemetry    SR 60s - Personally Reviewed  ECG    SR, ? Atypical RBBB - Personally Reviewed  Physical Exam   GEN: No acute distress.   Neck: No JVD Cardiac: RRR, no murmurs, rubs, or gallops.  Respiratory: Clear to auscultation bilaterally. GI: Soft, nontender, non-distended   MS: No edema; No deformity. Neuro:  Nonfocal  Psych: Normal affect   Labs    Chemistry Recent Labs  Lab 07/13/18 1635 07/14/18 0409  NA 138 140  K 6.1* 3.3*  CL 101 105  CO2 23 26  GLUCOSE 101* 91  BUN 27* 24*  CREATININE 1.74* 1.29*  CALCIUM 9.4 8.6*  PROT 6.2*  --   ALBUMIN 3.7  --   AST 56*  --   ALT 24  --   ALKPHOS 67  --   BILITOT 1.8*  --   GFRNONAA 38* 55*  GFRAA 44* >60  ANIONGAP 14 9     Hematology Recent Labs  Lab 07/13/18 1635 07/14/18 0409  WBC 10.3 8.6  RBC 6.06* 5.21  HGB 15.3 13.2  HCT 48.1 40.6  MCV 79.4 77.9*  MCH 25.2* 25.3*  MCHC 31.8 32.5  RDW 20.8* 20.3*  PLT 197 180    Cardiac EnzymesNo results for input(s): TROPONINI in the last 168 hours.  Recent Labs  Lab 07/13/18 1642 07/13/18 1925  TROPIPOC 0.03 0.10*     BNPNo results for input(s): BNP, PROBNP in the last 168 hours.   DDimer No results  for input(s): DDIMER in the last 168 hours.   Radiology    Dg Chest Portable 1 View  Result Date: 07/13/2018 CLINICAL DATA:  Chest pain and tachycardia. EXAM: PORTABLE CHEST 1 VIEW COMPARISON:  Chest x-ray dated Mar 06, 2018. FINDINGS: The heart size and mediastinal contours are within normal limits. Normal pulmonary vascularity. Stable subsegmental scarring/atelectasis at the lung bases. No focal consolidation, pleural effusion, or pneumothorax. No acute osseous abnormality. Old right-sided rib fractures again noted. IMPRESSION: No active disease. Electronically Signed   By: Titus Dubin M.D.   On: 07/13/2018 16:57    Cardiac Studies   2D Echo 10/01/17 Study Conclusions  - Left ventricle: The cavity size was normal. Wall thickness was   increased in a pattern of moderate LVH. Systolic function was   normal. The estimated ejection fraction was in the range of 60%   to 65%. Wall motion was normal; there were no regional wall   motion abnormalities. Doppler parameters are consistent with   abnormal left ventricular relaxation  (grade 1 diastolic   dysfunction). The E/e&' ratio is between 8-15, suggesting   indeterminate LV filling pressure. - Ventricular septum: Restrictive VSD noted - mean gradient 47   mmHg, peak gradient 77 mmHg. - Aortic valve: Mildly calcified leaflets. There was no stenosis.   There was no regurgitation. - Aorta: Aortic root dimension: 44 mm (ED). Ascending aortic   diameter: 40 mm (S). - Ascending aorta: The ascending aorta and root were mildly   dilated. - Left atrium: The atrium was normal in size. - Inferior vena cava: The vessel was normal in size. The   respirophasic diameter changes were in the normal range (>= 50%),   consistent with normal central venous pressure.  Impressions:  - Compared to a prior study in 2015, the LVEF is higher at 60-65%,   there is moderate LVH - a restrictive VSD is noted. The ascending   aorta measures 4.0 cm and the aortic root at 4.4 cm, normal LA   size.  Patient Profile     72 y/o male patient with history of afib, admitted with weakness and found to have AKI, hyperkalemia and afib with RVR.  Assessment & Plan    1. Afib w/ RVR: rapid ventricular response likely driven by AKI (?dehydration) and electrolyte abnormalities (hyperkalemia). Both conditions improving. He is back in SR with rates in the 60s after treatment w/ IV Cardizem. He has been transitioned to PO, 30 mg Q6H. Can to transition to long acting Cardizem 120 mg. Continue Xarelto for a/c.   2. AKI: admit SCr 1.74. Both Diovan and HCTZ were held on admit. IVF hydration given. Renal function improving. SCr at 1.29 today. Continue to monitor. When ARB and diuretic are resumed, monitor SCr. If home today, recommend outpatient BMP in 7 days.   3. Hyperkalemia: resolved but now hypokalemic. K down from 6.1 to 3.3. Give 40 mEq of Kdur x 1.   For questions or updates, please contact [place] Please consult [email or web address] for contact info under        Signed, Lyda Jester,  PA-C  07/14/2018, 7:05 AM

## 2018-07-14 NOTE — Discharge Summary (Signed)
Jose Stone NIO:[id number] DOB: 08-07-1946 DOA: 07/13/2018  PCP: Kathyrn Lass, MD  Admit date: 07/13/2018  Discharge date: 07/14/2018  Admitted From: Home   Disposition:  Home   Recommendations for Outpatient Follow-up:   Follow up with PCP in 1-2 weeks  PCP Please obtain BMP/CBC, 2 view CXR in 1week,  (see Discharge instructions)   PCP Please follow up on the following pending results:    Home Health: None   Equipment/Devices: None  Consultations: Cards Discharge Condition: Fair   CODE STATUS: Full   Diet Recommendation:  Heart Healthy     CC - weakness  Brief history of present illness from the day of admission and additional interim summary    Jose Stone is a 72 y.o. male with medical history significant of coronary artery disease, atrial fibrillation with recurrent RVR, congenital heart disease with restrictive VSD multiple cardiac cath, hypertension, hyperlipidemia and family history of coronary artery disease with last documented ejection fraction of 70% previously known 39 to 70% LAD stenosis who came to the ER with generalized weakness and A. fib with RVR                                                                 Hospital Course    Chronic A. fib.  Here with RVR.  Mali vas 2 score of at least 4.  Seen by his cardiologist Dr. [name], known to have preserved EF of around 60%, initially placed on IV Cardizem drip now transition to oral Cardizem, home dose Xarelto to be continued.  Has converted into sinus, stable and symptom-free, per cardiology cleared for home discharge.  Patient wants to be discharged ASAP as he has to attend a funeral.  Hypertension.  Home dose ARB dose reduced to give room for oral Cardizem.  PCP to monitor and adjust.  Dyslipidemia.  Home dose statin on board continue.  ARF  on admission.  Home dose ARB reduced, renal function close to baseline, PCP to monitor.  He was likely dehydrated upon admission in the presence of high-dose ARB.  GERD.  PPI continue.  History of CAD.  No acute issues.  Discharge diagnosis     Principal Problem:   Atrial fibrillation with RVR ([place]) Active Problems:   Essential hypertension   Hyperlipidemia   GERD (gastroesophageal reflux disease)   Coronary artery disease due to lipid rich plaque   PAF (paroxysmal atrial fibrillation) (HCC)   AKI (acute kidney injury) ([place])   Hyperkalemia    Discharge instructions    Discharge Instructions    Diet - low sodium heart healthy   Complete by:  As directed    Discharge instructions   Complete by:  As directed    Follow with Primary MD Kathyrn Lass, MD in 7 days   Get CBC, CMP  checked  by Primary MD  in 5-7 days  Activity: As tolerated with Full fall precautions use walker/cane & assistance as needed  Disposition Home   Diet:   Heart Healthy    For Heart failure patients - Check your Weight same time everyday, if you gain over 2 pounds, or you develop in leg swelling, experience more shortness of breath or chest pain, call your Primary MD immediately. Follow Cardiac Low Salt Diet and 1.5 lit/day fluid restriction.  Special Instructions: If you have smoked or chewed Tobacco  in the last 2 yrs please stop smoking, stop any regular Alcohol  and or any Recreational drug use.  On your next visit with your primary care physician please Get Medicines reviewed and adjusted.  Please request your Prim.MD to go over all Hospital Tests and Procedure/Radiological results at the follow up, please get all Hospital records sent to your Prim MD by signing hospital release before you go home.  If you experience worsening of your admission symptoms, develop shortness of breath, life threatening emergency, suicidal or homicidal thoughts you must seek medical attention immediately by calling 911  or calling your MD immediately  if symptoms less severe.  You Must read complete instructions/literature along with all the possible adverse reactions/side effects for all the Medicines you take and that have been prescribed to you. Take any new Medicines after you have completely understood and accpet all the possible adverse reactions/side effects.   Increase activity slowly   Complete by:  As directed       Discharge Medications   Allergies as of 07/14/2018      Reactions   Neosporin [neomycin-bacitracin Zn-polymyx] Rash   Percocet [oxycodone-acetaminophen] Rash   Patient can tolerate acetaminophen solely   Vicodin [hydrocodone-acetaminophen] Rash   Patient can tolerate acetaminophen solely      Medication List    TAKE these medications   acetaminophen 500 MG tablet Commonly known as:  TYLENOL Take 1,000 mg by mouth every 6 (six) hours as needed for mild pain or headache.   allopurinol 100 MG tablet Commonly known as:  ZYLOPRIM Take 200 mg by mouth daily.   aspirin 81 MG tablet Take 81 mg by mouth daily.   atorvastatin 80 MG tablet Commonly known as:  LIPITOR Take 1 tablet (80 mg total) by mouth daily at 6 PM.   b complex vitamins capsule Take 1 capsule by mouth daily.   chlorthalidone 25 MG tablet Commonly known as:  HYGROTON TAKE 1/2 TABLET DAILY   cholecalciferol 1000 units tablet Commonly known as:  VITAMIN D Take 1,000 Units by mouth daily.   COLCRYS 0.6 MG tablet Generic drug:  colchicine Take 0.6 mg by mouth as needed (FOR GOUT).   diltiazem 120 MG 24 hr capsule Commonly known as:  CARDIZEM CD Take 1 capsule (120 mg total) by mouth daily.   DRY EYE OMEGA BENEFITS/VIT D-3 PO Take 2 capsules by mouth 2 (two) times daily. Patient takes 4 daily, spreads them out throughout the day   folic acid 737 MCG tablet Commonly known as:  FOLVITE Take 400 mcg by mouth 2 (two) times daily.   hydroxypropyl methylcellulose / hypromellose 2.5 % ophthalmic  solution Commonly known as:  ISOPTO TEARS / GONIOVISC Place 1 drop into both eyes 4 (four) times daily.   loratadine 10 MG tablet Commonly known as:  CLARITIN Take 10 mg by mouth daily as needed for allergies.   nitroGLYCERIN 0.4 MG SL tablet Commonly known as:  NITROSTAT Place  1 tablet (0.4 mg total) under the tongue every 5 (five) minutes as needed for chest pain. Max 3 doses.   omeprazole 40 MG capsule Commonly known as:  PRILOSEC Take 40 mg by mouth every morning. 30-60 minutes prior to breakfast.   PARoxetine 20 MG tablet Commonly known as:  PAXIL Take 0.5 tablets (10 mg total) by mouth every other day.   phenazopyridine 200 MG tablet Commonly known as:  PYRIDIUM Take 200 mg by mouth 3 (three) times daily as needed.   ranitidine 150 MG tablet Commonly known as:  ZANTAC Take 150 mg by mouth every evening.   testosterone cypionate 200 MG/ML injection Commonly known as:  DEPOTESTOSTERONE CYPIONATE INJECT 1ML SUBCUTANEOUSLY EVERY 2 WEEKS   valsartan 160 MG tablet Commonly known as:  DIOVAN Take 1 tablet (160 mg total) by mouth daily. What changed:    how much to take  how to take this  when to take this  additional instructions   XARELTO 20 MG Tabs tablet Generic drug:  rivaroxaban TAKE 1 TABLET BY MOUTH EVERY DAY WITH SUPPER What changed:  See the new instructions.       Follow-up Information    Kathyrn Lass, MD. Schedule an appointment as soon as possible for a visit in 1 week(s).   Specialty:  Family Medicine Contact information: [place] [address] [phone]        [name], MD .   Specialty:  Cardiology Contact information: [address] [place] [address] [phone]           Major procedures and Radiology Reports - PLEASE review detailed and final reports thoroughly  -       Dg Chest Portable 1 View  Result Date: 07/13/2018 CLINICAL DATA:  Chest pain and tachycardia. EXAM:  PORTABLE CHEST 1 VIEW COMPARISON:  Chest x-ray dated Mar 06, 2018. FINDINGS: The heart size and mediastinal contours are within normal limits. Normal pulmonary vascularity. Stable subsegmental scarring/atelectasis at the lung bases. No focal consolidation, pleural effusion, or pneumothorax. No acute osseous abnormality. Old right-sided rib fractures again noted. IMPRESSION: No active disease. Electronically Signed   By: Titus Dubin M.D.   On: 07/13/2018 16:57    Micro Results     No results found for this or any previous visit (from the past 240 hour(s)).  Today   Subjective    Vivek Grealish today has no headache,no chest abdominal pain,no new weakness tingling or numbness, feels much better wants to go home today.     Objective   Blood pressure 113/60, pulse (!) 57, temperature 98.2 F (36.8 C), temperature source Oral, resp. rate 16, height 6\' 1"  (1.854 m), weight 109 kg, SpO2 96 %.   Intake/Output Summary (Last 24 hours) at 07/14/2018 1126 Last data filed at 07/14/2018 1023 Gross per 24 hour  Intake 1641.8 ml  Output -  Net 1641.8 ml    Exam Awake Alert, Oriented x 3, No new F.N deficits, Normal affect [place].AT,PERRAL Supple Neck,No JVD, No cervical lymphadenopathy appriciated.  Symmetrical Chest wall movement, Good air movement bilaterally, CTAB RRR,No Gallops,Rubs or new Murmurs, No Parasternal Heave +ve B.Sounds, Abd Soft, Non tender, No organomegaly appriciated, No rebound -guarding or rigidity. No Cyanosis, Clubbing or edema, No new Rash or bruise   Data Review   CBC w Diff:  Lab Results  Component Value Date   WBC 8.6 07/14/2018   HGB 13.2 07/14/2018   HCT 40.6 07/14/2018   PLT 180  07/14/2018   LYMPHOPCT 20 07/13/2018   MONOPCT 8 07/13/2018   EOSPCT 2 07/13/2018   BASOPCT 1 07/13/2018    CMP:  Lab Results  Component Value Date   NA 140 07/14/2018   K 3.3 (L) 07/14/2018   CL 105 07/14/2018   CO2 26 07/14/2018   BUN 24 (H) 07/14/2018   CREATININE 1.29  (H) 07/14/2018   CREATININE 1.20 10/30/2016   PROT 6.2 (L) 07/13/2018   ALBUMIN 3.7 07/13/2018   BILITOT 1.8 (H) 07/13/2018   ALKPHOS 67 07/13/2018   AST 56 (H) 07/13/2018   ALT 24 07/13/2018  .   Total Time in preparing paper work, data evaluation and todays exam - 13 minutes  Lala Lund M.D on 07/14/2018 at 11:26 AM  [facility]   Office  [phone]

## 2018-07-14 NOTE — Discharge Instructions (Addendum)
Follow with Primary MD Kathyrn Lass, MD in 7 days   Get CBC, CMP checked  by Primary MD  in 5-7 days  Activity: As tolerated with Full fall precautions use walker/cane & assistance as needed  Disposition Home   Diet:   Heart Healthy    For Heart failure patients - Check your Weight same time everyday, if you gain over 2 pounds, or you develop in leg swelling, experience more shortness of breath or chest pain, call your Primary MD immediately. Follow Cardiac Low Salt Diet and 1.5 lit/day fluid restriction.  Special Instructions: If you have smoked or chewed Tobacco  in the last 2 yrs please stop smoking, stop any regular Alcohol  and or any Recreational drug use.  On your next visit with your primary care physician please Get Medicines reviewed and adjusted.  Please request your Prim.MD to go over all Hospital Tests and Procedure/Radiological results at the follow up, please get all Hospital records sent to your Prim MD by signing hospital release before you go home.  If you experience worsening of your admission symptoms, develop shortness of breath, life threatening emergency, suicidal or homicidal thoughts you must seek medical attention immediately by calling 911 or calling your MD immediately  if symptoms less severe.  You Must read complete instructions/literature along with all the possible adverse reactions/side effects for all the Medicines you take and that have been prescribed to you. Take any new Medicines after you have completely understood and accpet all the possible adverse reactions/side effects.     Information on my medicine - XARELTO (Rivaroxaban)  This medication education was reviewed with me or my healthcare representative as part of my discharge preparation.  The pharmacist that spoke with me during my hospital stay was:  Saundra Shelling, [facility]  Why was Xarelto prescribed for you? Xarelto was prescribed for you to reduce the risk of a blood clot forming that can  cause a stroke if you have a medical condition called atrial fibrillation (a type of irregular heartbeat).  What do you need to know about xarelto ? Take your Xarelto ONCE DAILY at the same time every day with your evening meal. If you have difficulty swallowing the tablet whole, you may crush it and mix in applesauce just prior to taking your dose.  Take Xarelto exactly as prescribed by your doctor and DO NOT stop taking Xarelto without talking to the doctor who prescribed the medication.  Stopping without other stroke prevention medication to take the place of Xarelto may increase your risk of developing a clot that causes a stroke.  Refill your prescription before you run out.  After discharge, you should have regular check-up appointments with your healthcare provider that is prescribing your Xarelto.  In the future your dose may need to be changed if your kidney function or weight changes by a significant amount.  What do you do if you miss a dose? If you are taking Xarelto ONCE DAILY and you miss a dose, take it as soon as you remember on the same day then continue your regularly scheduled once daily regimen the next day. Do not take two doses of Xarelto at the same time or on the same day.   Important Safety Information A possible side effect of Xarelto is bleeding. You should call your healthcare provider right away if you experience any of the following: ? Bleeding from an injury or your nose that does not stop. ? Unusual colored urine (red or dark  brown) or unusual colored stools (red or black). ? Unusual bruising for unknown reasons. ? A serious fall or if you hit your head (even if there is no bleeding).  Some medicines may interact with Xarelto and might increase your risk of bleeding while on Xarelto. To help avoid this, consult your healthcare provider or pharmacist prior to using any new prescription or non-prescription medications, including herbals, vitamins,  non-steroidal anti-inflammatory drugs (NSAIDs) and supplements.  This website has more information on Xarelto: [email or web address]

## 2018-07-14 NOTE — Consult Note (Signed)
            [facility] CM Primary Care Navigator  07/14/2018  Jose Stone [birth year] [id number]   Went to seepatient at the bedside to identify possible discharge needs buthe was dischargedhomeper staff.  Per MD note,patientwas admitted to the hospital for evaluation of mild chest pain, weakness, some shortness of breath and another episode of atrial fibrillation with rapid ventricular response after being previously seen in the ED with generalized weakness, atrial fibrillation and had previous synchronous cardioversion.  Patient has discharge instruction to follow-upwith cardiology and primary care provider follow-up in 1 week.  Primary care provider's office is listed as providing transition of care (TOC) follow-up.   For additional questions please contact:  Edwena Felty A. Luis Sami, BSN, RN-BC [facility] PRIMARY CARE Navigator Cell: [phone]

## 2018-07-19 ENCOUNTER — Ambulatory Visit (INDEPENDENT_AMBULATORY_CARE_PROVIDER_SITE_OTHER): Payer: Medicare Other | Admitting: Internal Medicine

## 2018-07-19 ENCOUNTER — Encounter: Payer: Self-pay | Admitting: Internal Medicine

## 2018-07-19 VITALS — BP 120/74 | HR 68 | Ht 73.75 in | Wt 237.8 lb

## 2018-07-19 DIAGNOSIS — I351 Nonrheumatic aortic (valve) insufficiency: Secondary | ICD-10-CM

## 2018-07-19 DIAGNOSIS — I10 Essential (primary) hypertension: Secondary | ICD-10-CM | POA: Diagnosis not present

## 2018-07-19 DIAGNOSIS — I480 Paroxysmal atrial fibrillation: Secondary | ICD-10-CM

## 2018-07-19 NOTE — Patient Instructions (Signed)
Your physician wants you to follow-up in: 6 months with Dr. Hilty. You will receive a reminder letter in the mail two months in advance. If you don't receive a letter, please call our office to schedule the follow-up appointment.    

## 2018-07-19 NOTE — Progress Notes (Signed)
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 This is never caused issues in fact she's had right and left heart catheterizations dating back to the 1970s. 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 Other coronary risk factors include hypertension, dyslipidemia and family history of heart disease. He was sent for cardiac catheterization in 2006 for follow-up of VSD, at which time he had an echocardiogram which showed mild aortic regurgitation, a small membranous VSD and left atrial enlargement with an EF of 70%. He had a stress test at that time which showed findings concerning for him for a septal and inferoapical ischemia. He therefore was referred to left heart catheterization with strep demonstrated a 50-70% mid LAD stenosis and mild disease of the mid circumflex and first obtuse marginal branches. He's been managed medically since that time and previously saw Dr. Daneen Schick.  Recently he's been having some symptoms of sweating, palpitations and dizziness and eventually was found to be in atrial fibrillation when he presented to the hospital. At that time his troponin was elevated. He is placed on diltiazem which slowed his heart rate and he converted back to sinus spontaneously. Based on his elevated troponin, he was referred for left heart catheterization by Dr. Doylene Canard. This demonstrated a 60-70% mid LAD stenosis and a 90% obtuse marginal stenosis. Subsequently he underwent coronary intervention with placement of a 2.5 x 15 mm Xience Alpine stent to the mid OM. After this he became asymptomatic. He was going to have stress testing to determine the significance of his mid LAD lesion however it appears to be not significantly changed  since his heart catheterization 2006. He reports no further symptoms of atrial fibrillation but is concerned about bradycardia. He was not referred for cardiac rehabilitation due to the thought that he may need additional intervention to the LAD.  Jose Stone returns today for follow-up. She's done quite well in cardiac rehabilitation. At his last office visit we discontinued his diltiazem for bradycardia. He still has a low heart rate which has some degree of chronotropic incompetence. He reports no recurrent atrial fibrillation that were aware of. He continues on dual antiplatelet therapy. He had a stress test prior to enrolling in cardiac redilatation which was negative for ischemia.  I saw Jose Stone back in the office today. He is accompanied by his wife who was a cardiac nurse at the [employer] in [place] for a number years in the 1970s. They brought in pronounced of his heart rate monitor and had several questions about his heart rate and arrhythmias. My interpretation of the monitor indicates that he had some episodes of short bursts of atrial tachycardia but no evidence for A. fib. He also has PACs. Heart rate however was a low and remained that way despite stopping his calcium channel blocker. He was recently in the emergency room and was noted to have some paroxysmal tachycardia in the ER with a right bundle-branch pattern. This is what led to monitor placement. He reports fatigue which could be certainly related to his lower heart rate. He also says that he's developed gynecomastia which he relates to [place]. I've done a search on the medicine and do not see that however he reports in  the product labeling.  03/21/2016  Jose Stone was seen today in the office for an acute visit. He noted this morning shortly after waking up his heart rate was elevated in the low 100s. It improved somewhat and then around 9:00 it increased up to about 130. The heart rate is been stable since then and he called in for a  new acute triage visit. He reports feeling somewhat fatigued and noting that his heart rate is elevated. He denies any worsening shortness of breath or chest pain. EKG in the office shows atrial flutter with a regular rhythm at 131 bpm. As per recent notes he was previous on aspirin and clopidogrel but the caput ago was stopped. He was not on triple therapy. Currently he is only on low-dose aspirin. 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 I'm concerned about up titrating his medications because she's had history of slow ventricular rates in the past. We discussed a number of options but ultimately the best option is for him to undergo cardioversion to get back into normal rhythm.  04/17/2016  Jose Stone was seen back in the office today for follow-up. At his last office visit he was noted to be in atrial flutter with a rate of 131. He was referred directly to the ER and underwent cardioversion after being started on Xarelto. He is remained on Xarelto since then without any bleeding problems. I'm happy to say he is maintaining sinus rhythm today. He does report significant fatigue however. He is mostly compliant with CPAP however he had a sleep study a number of years ago and he is set possibly to low of an airway setting. He's had weight gain since then. He also has been having some bradycardia with heart rates in the 40s and 50s. He is on low-dose beta blocker but I been hesitant to stop that because of his RVR. He does report heart rate increases with exercise.   06/20/2016  Jose Stone returns today for follow-up. He underwent a sleep study on 06/05/2016. He did have a diagnosis of sleep apnea however that study indicated a very low AHI of 2.5 events per hour. He had actually about 25 events per hour in rem sleep, but overall the REM sleep latency was only about 8%. Ultimately he was diagnosis not having any significant apnea. This is led to some confusion as he previously  was wearing [place] CPAP device. He reports no change in his symptoms of fatigue which we thought could be related to sleep apnea. It also may be related to bradycardia and his beta blocker.  09/23/2016  Jose Stone comes back today and is doing well. His fatigue has improved somewhat with discontinuing his B-Blocker. Heartrate is increased. I spoke with Dr. Claiborne Billings about the possibility of OSA and he is not felt to have this. Dr. Claiborne Billings noted that RLS was present. He does not desire treatment for this. Blood pressure was somewhat elevated today. EKG shows sinus with PAC's. I reviewed a spreadsheet of his blood pressures today.  03/31/2017  Jose Stone was seen today in follow-up. Overall he is doing very well. He denies any chest pain worsening shortness of breath. Blood pressure is well-controlled. He brought in a graft indicating excellent control blood pressure and heart rate. He said no breakthrough A. fib. His recent lab work was provided by his PCP. Total cholesterol 134, trig Richards 85, HDL-C 49, LDL-C 68, non-HDL of 85. This represents excellent control. He is planning  a trip out [place] is been provided some Diamox to use as needed. He denies any bleeding problems on Xarelto.  09/03/2017  Jose Stone returns today for follow-up.  He underwent a recent stress test after I saw him in the emergency department for chest pain.  At the time he took some nitroglycerin which resulted in improvement in his symptoms however ruled out for MI by negative troponins.  His stress test demonstrated a small area of reversible ischemia in the inferolateral wall.  It was interpreted as intermediate risk.  Since then he has had no further chest pain.  I discussed the findings with him today and we talked about various options including medical therapy and/or repeat cardiac catheterization.  As he remains asymptomatic, I do not feel that medication adjustments necessary at this time.  Finally, he notes that he has had  some morning hypertension which is more significant than his daytime readings.  10/15/2017  Bradycardia returns today for follow-up.  As previously noted he had a mildly abnormal stress test and was having some chest discomfort.  That is completely resolved.  We have moved around his medications a little bit but he notes that his blood pressure still remains elevated at times during the day.  Echo was performed on 10/01/2017 which showed LVEF that was higher at 60-65%, stable dilated aortic root at 4.4 cm and the ascending aorta measured 4.0 cm.  No regional wall motion abnormalities were noted.  01/15/2018  Jose Stone returns today for follow-up of his blood pressure.  He reports after adjusting his medicines that in general his blood pressures been well controlled.  He denies any recurrent episodes of dizziness or lightheadedness.  Recently had some testing of testosterone.  He has had a number of urologic issues and possible damage to blood flow to 1 of the testicles.  That coupled with age I suspect his lead to low testosterone.  Recently as low as the 200s.  His urologist Dr. Junious Silk, is recommending supplemental testosterone.  He is asking for my cardiac opinion whether this is safe to use.  07/19/2018  Jose Stone returns today for hospital follow-up.  Fortunately was seen in the ER for acute onset A. fib with RVR.  This is in the setting of what appears to be marked dehydration.  He was working outside in the heat and developed acute renal failure.  Potassium was up to 6.1.  He responded to hydration and repeat renal profile last week showed that his creatinine returned to baseline.  Potassium was actually slightly low at 3.3.  He is not on repletion.  I stopped his chlorthalidone and reduced his ARB to 160 mg valsartan daily.  He denies any recurrent A. fib.  PMHx:  Past Medical History:  Diagnosis Date  . Atrial fibrillation ([place])   . CAD (coronary artery disease)   . GERD (gastroesophageal  reflux disease)   . Hyperlipidemia    VSD  . Hypertension     Past Surgical History:  Procedure Laterality Date  . CARDIAC CATHETERIZATION    . LEFT HEART CATHETERIZATION WITH CORONARY ANGIOGRAM N/A 08/14/2014   Procedure: LEFT HEART CATHETERIZATION WITH CORONARY ANGIOGRAM;  Surgeon: Birdie Riddle, MD;  Location: [facility];  Service: Cardiovascular;  Laterality: N/A;  . SHOULDER SURGERY    . TONSILLECTOMY  1955    FAMHx:  Family History  Problem Relation Age of Onset  . Pancreatic cancer Mother   . Aortic aneurysm Maternal Grandmother   .  Heart attack Maternal Grandfather   . Diabetes Paternal Grandfather   . CAD Paternal Grandfather     SOCHx:   reports that he has never smoked. He has never used smokeless tobacco. He reports that he does not drink alcohol or use drugs.  ALLERGIES:  Allergies  Allergen Reactions  . Neosporin [Neomycin-Bacitracin Zn-Polymyx] Rash  . Percocet [Oxycodone-Acetaminophen] Rash    Patient can tolerate acetaminophen solely  . Vicodin [Hydrocodone-Acetaminophen] Rash    Patient can tolerate acetaminophen solely    ROS: Pertinent items noted in HPI and remainder of comprehensive ROS otherwise negative.  HOME MEDS: Current Outpatient Medications  Medication Sig Dispense Refill  . acetaminophen (TYLENOL) 500 MG tablet Take 1,000 mg by mouth every 6 (six) hours as needed for mild pain or headache.    . allopurinol (ZYLOPRIM) 100 MG tablet Take 200 mg by mouth daily.  0  . aspirin 81 MG tablet Take 81 mg by mouth daily.    Marland Kitchen atorvastatin (LIPITOR) 80 MG tablet Take 1 tablet (80 mg total) by mouth daily at 6 PM. 90 tablet 3  . b complex vitamins capsule Take 1 capsule by mouth daily.    . cholecalciferol (VITAMIN D) 1000 UNITS tablet Take 1,000 Units by mouth daily.     Marland Kitchen COLCRYS 0.6 MG tablet Take 0.6 mg by mouth as needed (FOR GOUT).     Marland Kitchen diltiazem (CARDIZEM CD) 120 MG 24 hr capsule Take 1 capsule (120 mg total) by mouth daily. 30 capsule  11  . folic acid (FOLVITE) 270 MCG tablet Take 400 mcg by mouth 2 (two) times daily.     . hydroxypropyl methylcellulose / hypromellose (ISOPTO TEARS / GONIOVISC) 2.5 % ophthalmic solution Place 1 drop into both eyes 4 (four) times daily.    Marland Kitchen loratadine (CLARITIN) 10 MG tablet Take 10 mg by mouth daily as needed for allergies.    . nitroGLYCERIN (NITROSTAT) 0.4 MG SL tablet Place 1 tablet (0.4 mg total) under the tongue every 5 (five) minutes as needed for chest pain. Max 3 doses. 25 tablet 5  . Omega-3 Fat Ac-Cholecalciferol (DRY EYE OMEGA BENEFITS/VIT D-3 PO) Take 2 capsules by mouth 2 (two) times daily. Patient takes 4 daily, spreads them out throughout the day     . omeprazole (PRILOSEC) 40 MG capsule Take 40 mg by mouth every morning. 30-60 minutes prior to breakfast.  3  . PARoxetine (PAXIL) 20 MG tablet Take 0.5 tablets (10 mg total) by mouth every other day. 30 tablet 0  . phenazopyridine (PYRIDIUM) 200 MG tablet Take 200 mg by mouth 3 (three) times daily as needed.  0  . ranitidine (ZANTAC) 150 MG tablet Take 150 mg by mouth every evening.     . valsartan (DIOVAN) 160 MG tablet Take 1 tablet (160 mg total) by mouth daily. 135 tablet 1  . XARELTO 20 MG TABS tablet TAKE 1 TABLET BY MOUTH EVERY DAY WITH SUPPER (Patient taking differently: Take 20 mg by mouth every evening. ) 90 tablet 1  . chlorthalidone (HYGROTON) 25 MG tablet TAKE 1/2 TABLET DAILY (Patient not taking: No sig reported) 90 tablet 0  . testosterone cypionate (DEPOTESTOSTERONE CYPIONATE) 200 MG/ML injection INJECT 1ML SUBCUTANEOUSLY EVERY 2 WEEKS  1   No current facility-administered medications for this visit.     LABS/IMAGING: No results found for this or any previous visit (from the past 48 hour(s)). No results found.  VITALS: BP 120/74 (BP Location: Left Arm, Patient Position: Sitting, Cuff Size: Normal)  Pulse 68   Ht 6' 1.75" (1.873 m)   Wt 237 lb 12.8 oz (107.9 kg)   BMI 30.74 kg/m   EXAM: General  appearance: alert and no distress Lungs: clear to auscultation bilaterally Heart: regular rate and rhythm Extremities: extremities normal, atraumatic, no cyanosis or edema Neurologic: Grossly normal  EKG: Sinus rhythm with PVC at 68, incomplete right bundle branch block-personally reviewed  ASSESSMENT: 1. A. fib with RVR-spontaneously converted, possibly related to ischemia (not on triple anticoagulation) 2. Chest pain resolved- mildly abnormal nuclear stress test, normal LVEF 60-65% on echo/normal wall motion 3. Paroxysmal atrial flutter - CHADSVASC score of 1 on Xarelto 4. Coronary artery disease status post PCI to the OM with a Xience Alpine DES (2.515 mm) - on low dose Aspirin 5. Residual moderate to severe mid LAD disease 6. Hypertension 7. Dyslipidemia 8. Small perimembranous VSD 9. Fatigue-possibly symptomatic bradycardia  10. Trivial AI 11. 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 Low testosterone  PLAN: 1.   Jose Stone was just hospitalized with recurrent A. fib with RVR in the setting of acute renal failure and hyperkalemia.  This is since resolved.  He was taken off of chlorthalidone and his ARB was reduced to 160 mg daily.  Blood pressures well controlled today.  We will current continue this current regimen.  Fortunately he has been anticoagulated on Xarelto which should remain on that.  Follow-up with me in 6 months.  [name], MD,  [facility] [place] Director of the [facility]  [facility] Attending Cardiologist  Direct Dial: [phone]  Fax: [phone]  Website:  [email or web address] 07/19/2018, 11:11 AM

## 2018-07-21 DIAGNOSIS — Z6831 Body mass index (BMI) 31.0-31.9, adult: Secondary | ICD-10-CM | POA: Diagnosis not present

## 2018-07-21 DIAGNOSIS — Z23 Encounter for immunization: Secondary | ICD-10-CM | POA: Diagnosis not present

## 2018-07-21 DIAGNOSIS — I4891 Unspecified atrial fibrillation: Secondary | ICD-10-CM | POA: Diagnosis not present

## 2018-07-21 DIAGNOSIS — N289 Disorder of kidney and ureter, unspecified: Secondary | ICD-10-CM | POA: Diagnosis not present

## 2018-07-29 ENCOUNTER — Ambulatory Visit (INDEPENDENT_AMBULATORY_CARE_PROVIDER_SITE_OTHER): Payer: Medicare Other | Admitting: Podiatry

## 2018-07-29 DIAGNOSIS — L97501 Non-pressure chronic ulcer of other part of unspecified foot limited to breakdown of skin: Secondary | ICD-10-CM | POA: Diagnosis not present

## 2018-07-29 DIAGNOSIS — N400 Enlarged prostate without lower urinary tract symptoms: Secondary | ICD-10-CM | POA: Diagnosis not present

## 2018-07-29 NOTE — Progress Notes (Signed)
Subjective: 72 year old male presents the office today for concerns of a recurrent wound to left big toe.  He states that about 1 week ago.  He does get a thick callus on the area and he did try to trim and he pulled some skin off and afterwards developed the wound.  He denies any drainage or crusting from the area no swelling redness or red streaks.  No drainage or pus. Denies any systemic complaints such as fevers, chills, nausea, vomiting. No acute changes since last appointment, and no other complaints at this time.   Objective: AAO x3, NAD DP/PT pulses palpable bilaterally, CRT less than 3 seconds Recurrent hyperkeratotic lesion plantar aspect left hallux.  After debridement there is a small wound measuring approximate 0.2 x 0.2 x 0.1 cm with a granular wound base.  There is no probing, undermining or tunneling.  There is no swelling erythema, ascending sialitis.  There is no fluctuation of the fissure and malodor.  No open lesions or pre-ulcerative lesions.  No pain with calf compression, swelling, warmth, erythema  Assessment: 72 year old male hyperkeratotic lesion, ulceration left foot  Plan: -All treatment options discussed with the patient including all alternatives, risks, complications.  -Patient was sharply debrided without any complications to reveal the underlying ulceration.  Continue with daily dressing changes in her collagen, silver dressing at home she can continue with.  I also had Liliane Channel evaluated him today and the insert was further modified to help offload the area.  Again discussed surgical intervention but wishes to hold off on doing so. -Patient encouraged to call the office with any questions, concerns, change in symptoms.

## 2018-08-08 ENCOUNTER — Telehealth: Payer: Self-pay | Admitting: Student

## 2018-08-08 NOTE — Telephone Encounter (Addendum)
   Patient called the after hours line reporting a "funny sensation/fluttering in his chest" earlier this morning. Checked his HR and it was elevated to 120. His wife who is a retired Marine scientist noted that his pulse was irregular and thought this was most consistent with known PAF (on Xarelto for anticoagulation and Cardizem CD for rate-control). He overall felt fine throughout the morning and the sensation along his chest resolved. Denies any actual pain, dyspnea, or presyncope.   Rechecked his BP and HR at 1430 and BP was 138/62 with HR at 102. This is when he called and we reviewed that he could take an extra Cardizem tablet if needed. His wife rechecked his HR during the phone call and this was back in the 70's.  Given his overall asymptomatic state, I recommended they continue to follow HR and BP in the outpatient setting. He may require dose titration of Cardizem CD pending readings and overall symptoms. Compliance with Xarelto was reinforced.   He voiced understanding of this and was appreciative of the call.  Signed, Erma Heritage, PA-C 08/08/2018, 4:24 PM Pager: [phone]

## 2018-08-20 DIAGNOSIS — H2513 Age-related nuclear cataract, bilateral: Secondary | ICD-10-CM | POA: Diagnosis not present

## 2018-08-20 DIAGNOSIS — H11001 Unspecified pterygium of right eye: Secondary | ICD-10-CM | POA: Diagnosis not present

## 2018-08-20 DIAGNOSIS — H524 Presbyopia: Secondary | ICD-10-CM | POA: Diagnosis not present

## 2018-10-04 ENCOUNTER — Other Ambulatory Visit: Payer: Self-pay | Admitting: Internal Medicine

## 2018-10-06 DIAGNOSIS — Z6832 Body mass index (BMI) 32.0-32.9, adult: Secondary | ICD-10-CM | POA: Diagnosis not present

## 2018-10-06 DIAGNOSIS — M79672 Pain in left foot: Secondary | ICD-10-CM | POA: Diagnosis not present

## 2018-10-12 ENCOUNTER — Ambulatory Visit (INDEPENDENT_AMBULATORY_CARE_PROVIDER_SITE_OTHER): Payer: Medicare Other | Admitting: Podiatry

## 2018-10-12 ENCOUNTER — Encounter: Payer: Self-pay | Admitting: Podiatry

## 2018-10-12 VITALS — BP 143/85 | HR 68 | Resp 16

## 2018-10-12 DIAGNOSIS — L97501 Non-pressure chronic ulcer of other part of unspecified foot limited to breakdown of skin: Secondary | ICD-10-CM

## 2018-10-12 DIAGNOSIS — L97521 Non-pressure chronic ulcer of other part of left foot limited to breakdown of skin: Secondary | ICD-10-CM | POA: Diagnosis not present

## 2018-10-12 DIAGNOSIS — B351 Tinea unguium: Secondary | ICD-10-CM | POA: Diagnosis not present

## 2018-10-13 DIAGNOSIS — B351 Tinea unguium: Secondary | ICD-10-CM | POA: Insufficient documentation

## 2018-10-13 NOTE — Progress Notes (Signed)
Subjective: 72 year old male presents the office today for concerns of a blister, wound on the left big toe.  He states that he had a callus and pumice stones it to get it down.  However he did develop gout on his left foot has significant swelling and redness and since then this caused the blister to form and the wound opened back up.  He also has a concern about toenail fungus in the right big toe.  Been using over-the-counter medicine for the last couple months.  Notes mild improvement.  No redness or drainage or any pain to the nail. Denies any systemic complaints such as fevers, chills, nausea, vomiting. No acute changes since last appointment, and no other complaints at this time.   Objective: AAO x3, NAD DP/PT pulses palpable bilaterally, CRT less than 3 seconds Hyperkeratotic tissue with some evidence of old blister present along the medial aspect the left big toe.  Upon debridement there is a fissure type wound present.  There is no probing, undermining or tunneling.  No significant edema or erythema to the toe and there is no fluctuation or crepitation or any malodor.  No drainage or pus. Right hallux toenails hypertrophic, dystrophic with yellow and brown discoloration.  No pain in the nails there is no surrounding redness or drainage or other signs of infection of the toenail sites. Upon evaluation of his shoes on the left foot he is pronating quite a bit and that she was quite worn out more on the left side than the right. No evidence of acute gout at this time. No open lesions or pre-ulcerative lesions.  No pain with calf compression, swelling, warmth, erythema  Assessment: Recurrent ulceration left hallux, onychomycosis  Plan: -All treatment options discussed with the patient including all alternatives, risks, complications.  -I debrided the wound on the left hallux without any complications or bleeding down to healthy tissue.  Recommend continue with antibiotic ointment dressing  changes with mupirocin ointment.  He has this at home.  Continue offloading at all times.  Also had Liliane Channel evaluate him today given the excessive pronation I think this is contributing to the recurrence of the blister and the wound.  With modified his orthotics today. -Regards to the toenail he wants to continue with topical treatment.  Discussed Fungi-Nail.  Also discussed other topical antifungal so we can order prescription but he will continue the over-the-counter for now. -Patient encouraged to call the office with any questions, concerns, change in symptoms.   Trula Slade DPM

## 2018-10-28 ENCOUNTER — Ambulatory Visit: Payer: Medicare Other | Admitting: Podiatry

## 2018-11-01 ENCOUNTER — Other Ambulatory Visit: Payer: Self-pay | Admitting: Internal Medicine

## 2018-11-08 DIAGNOSIS — N400 Enlarged prostate without lower urinary tract symptoms: Secondary | ICD-10-CM | POA: Diagnosis not present

## 2018-11-08 DIAGNOSIS — E291 Testicular hypofunction: Secondary | ICD-10-CM | POA: Diagnosis not present

## 2018-11-11 ENCOUNTER — Telehealth: Payer: Self-pay | Admitting: Internal Medicine

## 2018-11-11 NOTE — Telephone Encounter (Signed)
New message   Pt c/o BP issue: STAT if pt c/o blurred vision, one-sided weakness or slurred speech  1. What are your last 5 BP readings? 175/88 186/87 11/11/2018    128/66 154/72 167/78 11/11/2018  2. Are you having any other symptoms (ex. Dizziness, headache, blurred vision, passed out)? No   3. What is your BP issue? Patient states that his bp is elevated

## 2018-11-11 NOTE — Telephone Encounter (Signed)
Spoke with patient and advised him of MD recommendations. He takes both diltiazem & valsartan in the AM. Advised OK to check BP prior to meds but also check 1-2 hours after meds and then in afternoon if able. He has been scheduled to see MD on 1/20 @ 2:45pm. Advised he bring log of readings and BP cuff to this visit.

## 2018-11-11 NOTE — Telephone Encounter (Signed)
Spoke with patient of Dr. [name] who c/o BP elevation. He reports he has been going to gym 3 days/week and yesterday the elliptical was reading his HR was 200bpm. He went home and HR was 100bpm, came down to 70, and he decided to check his BP (he does not routinely check BP). He has no complaints - no headache, blurry vision.   BP(s) yesterday: 167/78, 154/72, 128/66  BP(s) today: 175/88, 186/87  Patient states he is taking diltiazem 120mg  daily, valsartan 160mg  daily. Chlorthalidone 12.5mg  was stopped at hospital discharge per patient, although remained on discharge summary and thus med list. This was confirmed by review of Hilty MD hospital notes. Med list has been updated.   He is on testosterone injections and reports his levels have been high recently. He is wondering if this could be contributing.   Advised would route to MD for advice and f/up with him

## 2018-11-11 NOTE — Telephone Encounter (Signed)
We should get him back in for an appointment to readjust his meds. May need to switch diltiazem over to norvasc and start beta blocker. Have him bring his home BP readings in - thanks! 1-3 week timeframe.  Dr. Lemmie Evens

## 2018-11-18 ENCOUNTER — Other Ambulatory Visit: Payer: Self-pay | Admitting: Internal Medicine

## 2018-11-22 ENCOUNTER — Encounter: Payer: Self-pay | Admitting: Internal Medicine

## 2018-11-22 ENCOUNTER — Ambulatory Visit (INDEPENDENT_AMBULATORY_CARE_PROVIDER_SITE_OTHER): Payer: Medicare Other | Admitting: Internal Medicine

## 2018-11-22 VITALS — BP 138/78 | HR 66 | Ht 74.0 in | Wt 244.6 lb

## 2018-11-22 DIAGNOSIS — I480 Paroxysmal atrial fibrillation: Secondary | ICD-10-CM

## 2018-11-22 DIAGNOSIS — I10 Essential (primary) hypertension: Secondary | ICD-10-CM

## 2018-11-22 DIAGNOSIS — I351 Nonrheumatic aortic (valve) insufficiency: Secondary | ICD-10-CM

## 2018-11-22 NOTE — Progress Notes (Addendum)
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 This is never caused issues in fact she's had right and left heart catheterizations dating back to the 1970s. 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 Other coronary risk factors include hypertension, dyslipidemia and family history of heart disease. He was sent for cardiac catheterization in 2006 for follow-up of VSD, at which time he had an echocardiogram which showed mild aortic regurgitation, a small membranous VSD and left atrial enlargement with an EF of 70%. He had a stress test at that time which showed findings concerning for him for a septal and inferoapical ischemia. He therefore was referred to left heart catheterization with strep demonstrated a 50-70% mid LAD stenosis and mild disease of the mid circumflex and first obtuse marginal branches. He's been managed medically since that time and previously saw Dr. Daneen Schick.  Recently he's been having some symptoms of sweating, palpitations and dizziness and eventually was found to be in atrial fibrillation when he presented to the hospital. At that time his troponin was elevated. He is placed on diltiazem which slowed his heart rate and he converted back to sinus spontaneously. Based on his elevated troponin, he was referred for left heart catheterization by Dr. Doylene Canard. This demonstrated a 60-70% mid LAD stenosis and a 90% obtuse marginal stenosis. Subsequently he underwent coronary intervention with placement of a 2.5 x 15 mm Xience Alpine stent to the mid OM. After this he became asymptomatic. He was going to have stress testing to determine the significance of his mid LAD lesion however it appears to be not significantly changed since  his heart catheterization 2006. He reports no further symptoms of atrial fibrillation but is concerned about Jose Stone. He was not referred for cardiac rehabilitation due to the thought that he may need additional intervention to the LAD.  Jose Stone returns today for follow-up. She's done quite well in cardiac rehabilitation. At his last office visit we discontinued his diltiazem for Jose Stone. He still has a low heart rate which has some degree of chronotropic incompetence. He reports no recurrent atrial fibrillation that were aware of. He continues on dual antiplatelet therapy. He had a stress test prior to enrolling in cardiac redilatation which was negative for ischemia.  I saw Jose Stone back in the office today. He is accompanied by his wife who was a cardiac nurse at the [employer] in [place] for a number years in the 1970s. They brought in pronounced of his heart rate monitor and had several questions about his heart rate and arrhythmias. My interpretation of the monitor indicates that he had some episodes of short bursts of atrial tachycardia but no evidence for A. fib. He also has PACs. Heart rate however was a low and remained that way despite stopping his calcium channel blocker. He was recently in the emergency room and was noted to have some paroxysmal tachycardia in the ER with a right bundle-branch pattern. This is what led to monitor placement. He reports fatigue which could be certainly related to his lower heart rate. He also says that he's developed gynecomastia which he relates to [place]. I've done a search on the medicine and do not see that however he reports  in the product labeling.  03/21/2016  Jose Stone was seen today in the office for an acute visit. He noted this morning shortly after waking up his heart rate was elevated in the low 100s. It improved somewhat and then around 9:00 it increased up to about 130. The heart rate is been stable since then and he called in for a new  acute triage visit. He reports feeling somewhat fatigued and noting that his heart rate is elevated. He denies any worsening shortness of breath or chest pain. EKG in the office shows atrial flutter with a regular rhythm at 131 bpm. As per recent notes he was previous on aspirin and clopidogrel but the caput ago was stopped. He was not on triple therapy. Currently he is only on low-dose aspirin. 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 I'm concerned about up titrating his medications because she's had history of slow ventricular rates in the past. We discussed a number of options but ultimately the best option is for him to undergo cardioversion to get back into normal rhythm.  04/17/2016  Jose Stone was seen back in the office today for follow-up. At his last office visit he was noted to be in atrial flutter with a rate of 131. He was referred directly to the ER and underwent cardioversion after being started on Xarelto. He is remained on Xarelto since then without any bleeding problems. I'm happy to say he is maintaining sinus rhythm today. He does report significant fatigue however. He is mostly compliant with CPAP however he had a sleep study a number of years ago and he is set possibly to low of an airway setting. He's had weight gain since then. He also has been having some Jose Stone with heart rates in the 40s and 50s. He is on low-dose beta blocker but I been hesitant to stop that because of his RVR. He does report heart rate increases with exercise.   06/20/2016  Jose Stone returns today for follow-up. He underwent a sleep study on 06/05/2016. He did have a diagnosis of sleep apnea however that study indicated a very low AHI of 2.5 events per hour. He had actually about 25 events per hour in rem sleep, but overall the REM sleep latency was only about 8%. Ultimately he was diagnosis not having any significant apnea. This is led to some confusion as he previously was  wearing [place] CPAP device. He reports no change in his symptoms of fatigue which we thought could be related to sleep apnea. It also may be related to Jose Stone and his beta blocker.  09/23/2016  Jose Stone comes back today and is doing well. His fatigue has improved somewhat with discontinuing his B-Blocker. Heartrate is increased. I spoke with Dr. Claiborne Billings about the possibility of OSA and he is not felt to have this. Dr. Claiborne Billings noted that RLS was present. He does not desire treatment for this. Blood pressure was somewhat elevated today. EKG shows sinus with PAC's. I reviewed a spreadsheet of his blood pressures today.  03/31/2017  Jose Stone was seen today in follow-up. Overall he is doing very well. He denies any chest pain worsening shortness of breath. Blood pressure is well-controlled. He brought in a graft indicating excellent control blood pressure and heart rate. He said no breakthrough A. fib. His recent lab work was provided by his PCP. Total cholesterol 134, trig Richards 85, HDL-C 49, LDL-C 68, non-HDL of 85. This represents excellent control. He is  planning a trip out [place] is been provided some Diamox to use as needed. He denies any bleeding problems on Xarelto.  09/03/2017  Jose Stone returns today for follow-up.  He underwent a recent stress test after I saw him in the emergency department for chest pain.  At the time he took some nitroglycerin which resulted in improvement in his symptoms however ruled out for MI by negative troponins.  His stress test demonstrated a small area of reversible ischemia in the inferolateral wall.  It was interpreted as intermediate risk.  Since then he has had no further chest pain.  I discussed the findings with him today and we talked about various options including medical therapy and/or repeat cardiac catheterization.  As he remains asymptomatic, I do not feel that medication adjustments necessary at this time.  Finally, he notes that he has had some  morning hypertension which is more significant than his daytime readings.  10/15/2017  Jose Stone returns today for follow-up.  As previously noted he had a mildly abnormal stress test and was having some chest discomfort.  That is completely resolved.  We have moved around his medications a little bit but he notes that his blood pressure still remains elevated at times during the day.  Echo was performed on 10/01/2017 which showed LVEF that was higher at 60-65%, stable dilated aortic root at 4.4 cm and the ascending aorta measured 4.0 cm.  No regional wall motion abnormalities were noted.  01/15/2018  Jose Stone returns today for follow-up of his blood pressure.  He reports after adjusting his medicines that in general his blood pressures been well controlled.  He denies any recurrent episodes of dizziness or lightheadedness.  Recently had some testing of testosterone.  He has had a number of urologic issues and possible damage to blood flow to 1 of the testicles.  That coupled with age I suspect his lead to low testosterone.  Recently as low as the 200s.  His urologist Dr. Junious Silk, is recommending supplemental testosterone.  He is asking for my cardiac opinion whether this is safe to use.  07/19/2018  Jose Stone returns today for hospital follow-up.  Fortunately was seen in the ER for acute onset A. fib with RVR.  This is in the setting of what appears to be marked dehydration.  He was working outside in the heat and developed acute renal failure.  Potassium was up to 6.1.  He responded to hydration and repeat renal profile last week showed that his creatinine returned to baseline.  Potassium was actually slightly low at 3.3.  He is not on repletion.  I stopped his chlorthalidone and reduced his ARB to 160 mg valsartan daily.  He denies any recurrent A. Fib.  11/23/2018  Jose Stone seen today for labile blood pressures.  He recently messaged the office and was noted to have elevated blood pressures.   Previously had been on higher dose blood pressure medication however this was reduced including stopping chlorthalidone and reducing his ARB because of hyperkalemia.  The decrease in ARB probably was the reason why his blood pressures have remained elevated.  His wife is also concerned about a wider pulse pressure which at times can be close to 100 mmHg.  Despite this diastolics have not been below 50.  EKG shows sinus rhythm with sinus arrhythmia incomplete right bundle branch today at 66.  PMHx:  Past Medical History:  Diagnosis Date  . Atrial fibrillation ([place])   . CAD (coronary artery disease)   .  GERD (gastroesophageal reflux disease)   . Hyperlipidemia    VSD  . Hypertension     Past Surgical History:  Procedure Laterality Date  . CARDIAC CATHETERIZATION    . LEFT HEART CATHETERIZATION WITH CORONARY ANGIOGRAM N/A 08/14/2014   Procedure: LEFT HEART CATHETERIZATION WITH CORONARY ANGIOGRAM;  Surgeon: Birdie Riddle, MD;  Location: [facility] CATH LAB;  Service: Cardiovascular;  Laterality: N/A;  . SHOULDER SURGERY    . TONSILLECTOMY  1955    FAMHx:  Family History  Problem Relation Age of Onset  . Pancreatic cancer Mother   . Aortic aneurysm Maternal Grandmother   . Heart attack Maternal Grandfather   . Diabetes Paternal Grandfather   . CAD Paternal Grandfather     SOCHx:   reports that he has never smoked. He has never used smokeless tobacco. He reports that he does not drink alcohol or use drugs.  ALLERGIES:  Allergies  Allergen Reactions  . Neosporin [Neomycin-Bacitracin Zn-Polymyx] Rash  . Percocet [Oxycodone-Acetaminophen] Rash    Patient can tolerate acetaminophen solely  . Vicodin [Hydrocodone-Acetaminophen] Rash    Patient can tolerate acetaminophen solely    ROS: Pertinent items noted in HPI and remainder of comprehensive ROS otherwise negative.  HOME MEDS: Current Outpatient Medications  Medication Sig Dispense Refill  . acetaminophen (TYLENOL) 500 MG tablet  Take 1,000 mg by mouth every 6 (six) hours as needed for mild pain or headache.    . allopurinol (ZYLOPRIM) 100 MG tablet Take 200 mg by mouth daily.  0  . aspirin 81 MG tablet Take 81 mg by mouth daily.    Marland Kitchen atorvastatin (LIPITOR) 80 MG tablet TAKE 1 TABLET DAILY AT 6 PM 90 tablet 2  . b complex vitamins capsule Take 1 capsule by mouth daily.    . cholecalciferol (VITAMIN D) 1000 UNITS tablet Take 1,000 Units by mouth daily.     Marland Kitchen COLCRYS 0.6 MG tablet Take 0.6 mg by mouth as needed (FOR GOUT).     Marland Kitchen diltiazem (CARDIZEM CD) 120 MG 24 hr capsule Take 1 capsule (120 mg total) by mouth daily. 30 capsule 11  . folic acid (FOLVITE) 401 MCG tablet Take 400 mcg by mouth 2 (two) times daily.     . hydroxypropyl methylcellulose / hypromellose (ISOPTO TEARS / GONIOVISC) 2.5 % ophthalmic solution Place 1 drop into both eyes 4 (four) times daily.    Marland Kitchen loratadine (CLARITIN) 10 MG tablet Take 10 mg by mouth daily as needed for allergies.    . nitroGLYCERIN (NITROSTAT) 0.4 MG SL tablet Place 1 tablet (0.4 mg total) under the tongue every 5 (five) minutes as needed for chest pain. Max 3 doses. 25 tablet 5  . Omega-3 Fat Ac-Cholecalciferol (DRY EYE OMEGA BENEFITS/VIT D-3 PO) Take 2 capsules by mouth 2 (two) times daily. Patient takes 4 daily, spreads them out throughout the day     . omeprazole (PRILOSEC) 40 MG capsule Take 40 mg by mouth every morning. 30-60 minutes prior to breakfast.  3  . PARoxetine (PAXIL) 20 MG tablet Take 0.5 tablets (10 mg total) by mouth every other day. 30 tablet 0  . phenazopyridine (PYRIDIUM) 200 MG tablet Take 200 mg by mouth 3 (three) times daily as needed.  0  . testosterone cypionate (DEPOTESTOSTERONE CYPIONATE) 200 MG/ML injection INJECT 1ML SUBCUTANEOUSLY EVERY 2 WEEKS  1  . valsartan (DIOVAN) 160 MG tablet Take 1 tablet (160 mg total) by mouth daily. 135 tablet 1  . XARELTO 20 MG TABS tablet TAKE 1 TABLET  BY MOUTH EVERY DAY WITH SUPPER 90 tablet 1   No current  facility-administered medications for this visit.     LABS/IMAGING: No results found for this or any previous visit (from the past 48 hour(s)). No results found.  VITALS: BP 138/78   Pulse 66   Ht 6\' 2"  (1.88 m)   Wt 244 lb 9.6 oz (110.9 kg)   BMI 31.40 kg/m   EXAM: General appearance: alert and no distress Lungs: clear to auscultation bilaterally Heart: regular rate and rhythm Extremities: extremities normal, atraumatic, no cyanosis or edema Neurologic: Grossly normal  EKG: Sinus rhythm with sinus arrhythmia 66, incomplete right bundle branch block-personally reviewed  ASSESSMENT: 1. A. fib with RVR-spontaneously converted, possibly related to ischemia (not on triple anticoagulation) 2. Chest pain resolved- mildly abnormal nuclear stress test, normal LVEF 60-65% on echo/normal wall motion 3. Paroxysmal atrial flutter - CHADSVASC score of 1 on Xarelto 4. Coronary artery disease status post PCI to the OM with a Xience Alpine DES (2.515 mm) - on low dose Aspirin 5. Residual moderate to severe mid LAD disease 6. Hypertension 7. Dyslipidemia 8. Small perimembranous VSD 9. Fatigue-possibly symptomatic Jose Stone  10. Trivial AI 11. 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 Low testosterone  PLAN: 1.   Jose Stone has some persistently elevated blood pressures particularly in the morning.  I suspect that his blood pressure medication is running out and since we decrease the dose because of recent hyperkalemia he is undertreated.  I recommend restarting 80 mg of valsartan at night in addition to his 160 mg in the morning.  He will monitor his blood pressure on this combination will need to periodically check his potassium.  I would not restart the diuretic.  Recently he has had some increasing fatigue and daytime sleepiness.  He had a recent sleep study which apparently was negative for apnea and had discontinued his CPAP.  He has had a mild amount of weight gain.  He may need a  repeat study.  At this time he is not sure if he wants to proceed with it or not.  Contact us as needed.  Plan follow-up in 3 to 6 months.  [name], MD, [facility], [place] Director of the Advanced Lipid Disorders &  Cardiovascular Risk Reduction Clinic Attending Cardiologist  Direct Dial: [phone]  Fax: [phone]  Website:  [email or web address] Pricella Gaugh 11/22/2018, 3:22 PM

## 2018-11-22 NOTE — Patient Instructions (Signed)
Medication Instructions:  INCREASE valsartan to 160mg  in the morning and 80mg  in the evening If you need a refill on your cardiac medications before your next appointment, please call your pharmacy.   Follow-Up: At [facility], you and your health needs are our priority.  As part of our continuing mission to provide you with exceptional heart care, we have created designated Provider Care Teams.  These Care Teams include your primary Cardiologist (physician) and Advanced Practice Providers (APPs -  Physician Assistants and Nurse Practitioners) who all work together to provide you with the care you need, when you need it. You will need a follow up appointment in 3 months.  You may see [name], MD or one of the following Advanced Practice Providers on your designated Care Team: [place], [place] . Fabian Sharp, PA-C  Any Other Special Instructions Will Be Listed Below (If Applicable). Dr. [name] has requested that you obtain a new blood pressure cuff OMRON is a good brand Please call our office with your BP readings in 10-14 days or send via [place]:  Rest 5 minutes before taking your blood pressure.  Don't smoke or drink caffeinated beverages for at least 30 minutes before.  Take your blood pressure before (not after) you eat.  Sit comfortably with your back supported and both feet on the floor (don't cross your legs).  Elevate your arm to heart level on a table or a desk.  Use the proper sized cuff. It should fit smoothly and snugly around your bare upper arm. There should be enough room to slip a fingertip under the cuff. The bottom edge of the cuff should be 1 inch above the crease of the elbow.  Ideally, take 3 measurements at one sitting and record the average.

## 2018-11-23 ENCOUNTER — Encounter: Payer: Self-pay | Admitting: Internal Medicine

## 2018-11-24 ENCOUNTER — Telehealth: Payer: Self-pay | Admitting: *Deleted

## 2018-11-24 NOTE — Telephone Encounter (Signed)
Called patient to discuss his sleep apnea. Patient has a history of OSA, however on the last sleep study done in 2017 it shows no further need for CPAP therapy. Patient states that he continues to have daytime sleepiness, taking 2-3 naps throughout the day. He questions if he should restart CPAP therapy. I told the patient that I will consult with Dr Claiborne Billings for recommendations. He may advise him getting another sleep study sine the last one was 3 years ago. If it is still negative he may recommend he get a MLST to look for other causes for his daytime sleepiness. Patient is ok with this approach and will await for me to call him once Dr Claiborne Billings returns and reviews the message.

## 2018-11-24 NOTE — Telephone Encounter (Signed)
-----   Message from Fidel Levy, RN sent at 11/22/2018  3:50 PM EST ----- Regarding: sleep study Patient would like for you to call him about having another sleep study. I think he just wants to talk it through. He had one before and had CPAP before.   Thanks

## 2018-11-29 ENCOUNTER — Ambulatory Visit (INDEPENDENT_AMBULATORY_CARE_PROVIDER_SITE_OTHER): Payer: Medicare Other | Admitting: Podiatry

## 2018-11-29 ENCOUNTER — Ambulatory Visit (INDEPENDENT_AMBULATORY_CARE_PROVIDER_SITE_OTHER): Payer: Medicare Other

## 2018-11-29 ENCOUNTER — Encounter: Payer: Self-pay | Admitting: Podiatry

## 2018-11-29 VITALS — BP 142/73 | HR 77 | Temp 98.3°F | Resp 14

## 2018-11-29 DIAGNOSIS — L97501 Non-pressure chronic ulcer of other part of unspecified foot limited to breakdown of skin: Secondary | ICD-10-CM

## 2018-11-29 DIAGNOSIS — M2012 Hallux valgus (acquired), left foot: Secondary | ICD-10-CM

## 2018-11-29 DIAGNOSIS — L97528 Non-pressure chronic ulcer of other part of left foot with other specified severity: Secondary | ICD-10-CM

## 2018-11-29 MED ORDER — CEPHALEXIN 500 MG PO CAPS: 500.0000 mg | ORAL_CAPSULE | Freq: Three times a day (TID) | ORAL | 0 refills | Status: DC

## 2018-11-30 NOTE — Progress Notes (Signed)
Subjective: 73 year old male presents the office today for concerns of recurrent blister, wound on his left big toe.  He states the area did heal but he started to become more active again and that it opened back up.  He states it has been bleeding but denies any pus.  He has noted some minimal swelling but no redness or red streaks.  His wife presents with him today and they will discuss further treatment options to help prevent recurrence as this is been a chronic issue over the last 1.5 years.  He currently denies any fevers, chills, nausea, vomiting.  No calf pain, chest pain, shortness of breath  He is on xarelto   Objective: AAO x3, NAD DP/PT pulses palpable bilaterally, CRT less than 3 seconds Hallux abductus is present.  On the plantar aspect of the left hallux is hyperkeratotic lesion with underlying small superficial wound present.  There is some old blister present around the wound as well.  There is no drainage or pus identified today however there is some bloody drainage on the bandage.  There is no fluctuation of the rotation malodor. No open lesions or pre-ulcerative lesions.  No pain with calf compression, swelling, warmth, erythema  Assessment: Recurrent wound on the left hallux  Plan: -All treatment options discussed with the patient including all alternatives, risks, complications.  X-rays were obtained and reviewed.  No evidence of osteomyelitis.  Bone spurs are present.  On the lateral view there is some dorsi flexion at the hallux IPJ which think is contributing to the plantar hallux IPJ wound. -Today should be debrided the wound without any complications utilizing #614 with scalpel down to healthy, granular tissue.  I do want him to continue with medihoney dressing changes daily -We discussed long-term options.  Ultimately we discussed hallux IPJ fusion.  I want the wound to heal however before we do the surgery given the hardware and they are in agreement to this.  In the  meantime I want her to wear the surgical shoe help offload the area. -Keflex -Patient encouraged to call the office with any questions, concerns, change in symptoms.   Trula Slade DPM

## 2018-12-02 NOTE — Telephone Encounter (Signed)
K to schedule patient for a repeat sleep study.  He did not meet CPAP criteria in 2017 but had moderate sleep apnea during REM sleep.  His symptoms may certainly have progressed.

## 2018-12-03 ENCOUNTER — Telehealth: Payer: Self-pay | Admitting: *Deleted

## 2018-12-03 ENCOUNTER — Other Ambulatory Visit: Payer: Self-pay | Admitting: Cardiovascular Disease

## 2018-12-03 DIAGNOSIS — I480 Paroxysmal atrial fibrillation: Secondary | ICD-10-CM

## 2018-12-03 DIAGNOSIS — G4719 Other hypersomnia: Secondary | ICD-10-CM

## 2018-12-03 DIAGNOSIS — I10 Essential (primary) hypertension: Secondary | ICD-10-CM

## 2018-12-03 NOTE — Telephone Encounter (Signed)
Left message to return a call to update him on previous conversation.

## 2018-12-06 DIAGNOSIS — E291 Testicular hypofunction: Secondary | ICD-10-CM | POA: Diagnosis not present

## 2018-12-06 DIAGNOSIS — N35010 Post-traumatic urethral stricture, male, meatal: Secondary | ICD-10-CM | POA: Diagnosis not present

## 2018-12-06 DIAGNOSIS — N5201 Erectile dysfunction due to arterial insufficiency: Secondary | ICD-10-CM | POA: Diagnosis not present

## 2018-12-06 NOTE — Telephone Encounter (Signed)
Patient notified of Dr Evette Georges recommendations. Sleep study appointment scheduled  In March.

## 2018-12-09 ENCOUNTER — Other Ambulatory Visit: Payer: Self-pay | Admitting: Internal Medicine

## 2018-12-13 ENCOUNTER — Ambulatory Visit (INDEPENDENT_AMBULATORY_CARE_PROVIDER_SITE_OTHER): Payer: Medicare Other | Admitting: Podiatry

## 2018-12-13 ENCOUNTER — Encounter: Payer: Self-pay | Admitting: Podiatry

## 2018-12-13 VITALS — BP 112/62 | HR 70 | Temp 98.6°F | Resp 14

## 2018-12-13 DIAGNOSIS — M2012 Hallux valgus (acquired), left foot: Secondary | ICD-10-CM

## 2018-12-13 DIAGNOSIS — L97501 Non-pressure chronic ulcer of other part of unspecified foot limited to breakdown of skin: Secondary | ICD-10-CM | POA: Diagnosis not present

## 2018-12-14 NOTE — Progress Notes (Addendum)
Subjective: 73 year old male presents the office today for evaluation of a wound on the hallux.  He states that he was using the Medihoney on to the wound daily and started to dry out too much and he started using Vaseline.  He states the wound has been getting better.  He denies any drainage or pus coming from the area.  He states he has been wearing the majority of the time the offloading shoe.  Denies any swelling or redness or any red streaks. Denies any systemic complaints such as fevers, chills, nausea, vomiting. No acute changes since last appointment, and no other complaints at this time.   Objective: AAO x3, NAD-presents with wife DP/PT pulses palpable bilaterally, CRT less than 3 seconds There is a hyperkeratotic lesion on the plantar aspect of the left hallux on the IPJ.  Upon debridement it is the wound is almost completely healed.  There is no edema, erythema, drainage or pus there is no clinical signs of infection.  Upon weightbearing evaluation there is a decrease in medial arch height.  There is also hallux extensus present causing pressure on the plantar hallux IPJ as well as causing more the ulceration. No open lesions or pre-ulcerative lesions.  No pain with calf compression, swelling, warmth, erythema  Assessment: Recurrent LEFT hallux ulceration  Plan: -All treatment options discussed with the patient including all alternatives, risks, complications.  -Instructed with the callus that any complications or bleeding to reveal that the underlying ulceration is almost completely healed.  There is no signs of infection.  Again we discussed surgical intervention including a hallux IPJ fusion to help correct recurrence.  Even with this this is not a guarantee significant orthotic given his overall foot type.  He wants to proceed with this as this is been an ongoing issue.  We will plan on doing this in March.  In the meantime, to work on getting medical clearance.  I will see him back  prior to surgery for full consent form. -Patient encouraged to call the office with any questions, concerns, change in symptoms.   Trula Slade DPM

## 2018-12-17 ENCOUNTER — Encounter: Payer: Self-pay | Admitting: *Deleted

## 2018-12-17 NOTE — Progress Notes (Signed)
Per Dr. Jacqualyn Posey, I sent a medical clearance request letter to Dr. [name].

## 2018-12-21 ENCOUNTER — Encounter: Payer: Self-pay | Admitting: Podiatry

## 2018-12-21 NOTE — Progress Notes (Signed)
Medical clearance  -----   Jose Stone is at acceptable risk for foot surgery. Would recommend holding Xarelto for 2 days prior to the procedure and restarting 24-48 hours afterwards if bleeding is minimal. If it is necessary to hold aspirin, would recommend 7-10 days prior to the procedure for maximum benefit and restart after.  Thanks,  -Mali  Kenneth C. Hilty, MD, [facility], [place] Director of the Advanced Lipid Disorders &  Cardiovascular Risk Reduction Clinic Diplomate of the American Board of Clinical Lipidology Attending Cardiologist  Direct Dial: [phone]  Fax: [phone]  Website:  [email or web address]

## 2018-12-23 DIAGNOSIS — H02403 Unspecified ptosis of bilateral eyelids: Secondary | ICD-10-CM | POA: Diagnosis not present

## 2018-12-27 ENCOUNTER — Ambulatory Visit (INDEPENDENT_AMBULATORY_CARE_PROVIDER_SITE_OTHER): Payer: Medicare Other | Admitting: Podiatry

## 2018-12-27 DIAGNOSIS — M2012 Hallux valgus (acquired), left foot: Secondary | ICD-10-CM | POA: Diagnosis not present

## 2018-12-27 DIAGNOSIS — L84 Corns and callosities: Secondary | ICD-10-CM | POA: Diagnosis not present

## 2018-12-27 DIAGNOSIS — D689 Coagulation defect, unspecified: Secondary | ICD-10-CM

## 2018-12-27 NOTE — Patient Instructions (Signed)

## 2019-01-01 NOTE — Progress Notes (Addendum)
Subjective: 73 year old male presents the office today for evaluation of a wound on the hallux.  He states that overall the area is doing better.  He thinks the wound is healed.  Denies any drainage or pus or any surrounding redness.  He also also discussed further surgical intervention to help evert of the callus and wound which is been ongoing issue for over a year.  He has tried shoe modifications, padding, offloading the insignificant improvement he wants to consider surgical intervention. Denies any systemic complaints such as fevers, chills, nausea, vomiting. No acute changes since last appointment, and no other complaints at this time.   Objective: AAO x3, NAD-presents with wife DP/PT pulses palpable bilaterally, CRT less than 3 seconds There is a hyperkeratotic lesion on the plantar aspect of the left hallux on the IPJ.  Upon debridement this area the wound appears to be healed.  There is no edema, erythema, drainage of pus or any clinical signs of infection.  Upon weightbearing there is hallux with symptoms present at the level of the IPJ.  Again this is contributing to the wound on the IPJ.  Also bone spur palpable. No other lesions identified. No open lesions or pre-ulcerative lesions.  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  -I debrided the hyperkeratotic tissue over the area of the wound which reveals that the wound is healed he has no signs of infection.  As there is no signs of infection we discussed surgical intervention.  I discussed with him hallux IPJ fusion with bone spur excision.  We discussed the surgery was postoperative course.  Given his recurrent history of wounds he is at risk of infection which could lead to amputation he is understanding of this.   However currently is no signs of infection or ulcerations so I think this is the best time to do the surgery. -The incision placement as well as the postoperative course was discussed with the patient. I discussed risks of the surgery which include, but not limited to, infection, bleeding, pain, swelling, need for further surgery, delayed or nonhealing, painful or ugly scar, numbness or sensation changes, over/under correction, recurrence, transfer lesions, further deformity, hardware failure, DVT/PE, loss of toe/foot. Patient understands these risks and wishes to proceed with surgery. The surgical consent was reviewed with the patient all 3 pages were signed. No promises or guarantees were given to the outcome of the procedure. All questions were answered to the best of my ability. Before the surgery the patient was encouraged to call the office if there is any further questions. The surgery will be performed at [facility] on an outpatient basis.  Trula Slade DPM

## 2019-01-03 DIAGNOSIS — L918 Other hypertrophic disorders of the skin: Secondary | ICD-10-CM | POA: Diagnosis not present

## 2019-01-03 DIAGNOSIS — D485 Neoplasm of uncertain behavior of skin: Secondary | ICD-10-CM | POA: Diagnosis not present

## 2019-01-03 DIAGNOSIS — L821 Other seborrheic keratosis: Secondary | ICD-10-CM | POA: Diagnosis not present

## 2019-01-03 DIAGNOSIS — D225 Melanocytic nevi of trunk: Secondary | ICD-10-CM | POA: Diagnosis not present

## 2019-01-03 DIAGNOSIS — Z85820 Personal history of malignant melanoma of skin: Secondary | ICD-10-CM | POA: Diagnosis not present

## 2019-01-03 DIAGNOSIS — D1801 Hemangioma of skin and subcutaneous tissue: Secondary | ICD-10-CM | POA: Diagnosis not present

## 2019-01-07 ENCOUNTER — Telehealth: Payer: Self-pay

## 2019-01-07 NOTE — Telephone Encounter (Signed)
Valsartan is on backorder.  Pharmacy is requesting to change patient to Irbesartan - "insurance gives better copay."  Please advise?

## 2019-01-07 NOTE — Telephone Encounter (Signed)
I looked on the FDA website - the valsartan shortage is reported as resolved as of 01/05/19. He should try to contact his pharmacy again or try another one before we switch him.  Dr Lemmie Evens

## 2019-01-07 NOTE — Telephone Encounter (Signed)
Pt updated with Dr. Lysbeth Penner recommendation and states he will call other pharmacy to check if they have it in stock.

## 2019-01-11 ENCOUNTER — Telehealth: Payer: Self-pay | Admitting: *Deleted

## 2019-01-13 ENCOUNTER — Telehealth: Payer: Self-pay | Admitting: *Deleted

## 2019-01-13 NOTE — Telephone Encounter (Signed)
   [facility] Pre-operative Risk Assessment    Request for surgical clearance:  1. What type of surgery is being performed? HALLUX IPF FUSION OF THE LEFT FOOT   2. When is this surgery scheduled? 01/19/19  3. What type of clearance is required (medical clearance vs. Pharmacy clearance to hold med vs. Both)? BOTH  4. Are there any medications that need to be held prior to surgery and how long?   5. Practice name and name of physician performing surgery? [facility] DR MATTHEW WAGONER    6. What is your office phone number? [phone]    7.   What is your office fax number? [phone] OR Epic IN BASKET   8.   Anesthesia type (None, local, MAC, general) ? GENERAL ANESTHESIA

## 2019-01-13 NOTE — Telephone Encounter (Signed)
Pharm please address Xarelto 

## 2019-01-14 ENCOUNTER — Encounter (HOSPITAL_COMMUNITY)
Admission: RE | Admit: 2019-01-14 | Discharge: 2019-01-14 | Disposition: A | Discharge status: Home / Self Care | Payer: Medicare Other | Source: Ambulatory Visit | Attending: Podiatry | Admitting: Podiatry

## 2019-01-14 ENCOUNTER — Encounter (HOSPITAL_COMMUNITY): Payer: Self-pay

## 2019-01-14 ENCOUNTER — Other Ambulatory Visit: Payer: Self-pay

## 2019-01-14 DIAGNOSIS — Z01812 Encounter for preprocedural laboratory examination: Secondary | ICD-10-CM | POA: Insufficient documentation

## 2019-01-14 HISTORY — DX: Adverse effect of unspecified anesthetic, initial encounter: T41.45XA

## 2019-01-14 HISTORY — DX: Other complications of anesthesia, initial encounter: T88.59XA

## 2019-01-14 LAB — BASIC METABOLIC PANEL
Anion gap: 8 (ref 5–15)
BUN: 20 mg/dL (ref 8–23)
CO2: 26 mmol/L (ref 22–32)
Calcium: 9.2 mg/dL (ref 8.9–10.3)
Chloride: 104 mmol/L (ref 98–111)
Creatinine, Ser: 1.1 mg/dL (ref 0.61–1.24)
GFR calc Af Amer: >60 mL/min (ref >60)
GFR calc non Af Amer: >60 mL/min (ref >60)
Glucose, Bld: 94 mg/dL (ref 70–99)
Potassium: 4.4 mmol/L (ref 3.5–5.1)
Sodium: 138 mmol/L (ref 135–145)

## 2019-01-14 LAB — CBC
HCT: 46.2 % (ref 39.0–52.0)
HEMOGLOBIN: 14.3 g/dL (ref 13.0–17.0)
MCH: 23.5 pg — ABNORMAL LOW (ref 26.0–34.0)
MCHC: 31 g/dL (ref 30.0–36.0)
MCV: 75.9 fL — ABNORMAL LOW (ref 80.0–100.0)
Platelets: 220 10*3/uL (ref 150–400)
RBC: 6.09 MIL/uL — ABNORMAL HIGH (ref 4.22–5.81)
RDW: 19.7 % — ABNORMAL HIGH (ref 11.5–15.5)
WBC: 10 10*3/uL (ref 4.0–10.5)
nRBC: 0 % (ref 0.0–0.2)

## 2019-01-14 NOTE — Telephone Encounter (Signed)
Thanks Chad!

## 2019-01-14 NOTE — Progress Notes (Signed)
PCP - Sabra Heck  Cardiologist - Hilty  Chest x-ray - 07/13/18 (E)  EKG - 01/20/209E)  Stress Test -08/06/17 (E)  ECHO - 11/29/189(E)  Cardiac Cath - 10/10/96(E)  AICD-Denies PM-Denies LOOP-Denies  Sleep Study - 08/06/17(E) CPAP - Denies  LABS-CBC,BMP  ASA- LD 01/10/19   XARELTO- To be stopped 01/16/19  Anesthesia-Y (Cardiac history)  Pt denies having chest pain, sob, or fever at this time. All instructions explained to the pt, with a verbal understanding of the material. Pt agrees to go over the instructions while at home for a better understanding. The opportunity to ask questions was provided.

## 2019-01-14 NOTE — Telephone Encounter (Signed)
Patient with diagnosis of ATRIAL FIBRILLATION on [place] for anticoagulation.    Procedure:HALLUX IPJ FUSION OF LEFT FOOT Date of procedure: 01/19/2019  CHADS2-VASc score of  3 ( HTN, AGE,CAD)  *NO history of VTE/TIA/Stroke noted*  CrCl = 81 ML/MIN Platelet count = 180 ON 07/14/2018  Per office protocol, patient can hold XARELTO  for 2 days prior to procedure.

## 2019-01-14 NOTE — Telephone Encounter (Signed)
   Primary Cardiologist: [name], MD  Chart reviewed as part of pre-operative protocol coverage. Pt has known CAD s/p remote stenting of the OM and known LAD disease treated medically, also Atrial fibrillation on Xarelto. Patient was contacted 01/14/2019 in reference to pre-operative risk assessment for pending surgery as outlined below.  Reginal Wojcicki was last seen on 11/22/18 by Dr. [name].  Since that day, Michai Dieppa has done well w/o any anginal symptomatolgy. He is able to complete > 4 METs of physical activity w/o exertional CP or dyspnea. He works out at [employer] doing moderate aerobic exercise and light weight exercises w/o symptoms or limitations.   According to the Revised Cardiac Risk Index (RCRI), perioperative risk of major cardiac events is 0.9%.   Therefore, based on ACC/AHA guidelines, the patient would be at acceptable risk for the planned procedure without further cardiovascular testing.   Per pharmacy, ok to hold Xarelto 2 days prior to surgery. Resume as soon as safe to do so given h/o atrial fibrillation.  Please call with questions.  Lyda Jester, PA-C 01/14/2019, 10:07 AM

## 2019-01-14 NOTE — Pre-Procedure Instructions (Signed)
Jose Stone  01/14/2019      [id number] - [place], [place] - [address] [place] [address] Phone: [phone] Fax: [phone]    Your procedure is scheduled on Wednesday 01/19/19.  Report to [facility] at 0830 A.M.  Call this number if you have problems the morning of surgery:  [phone]   Remember:  Do not eat or drink after midnight.               Take these medicines the morning of surgery with A SIP OF WATER               Allopurinol (ZYLOPRIM)              Atorvastatin(Lipitor)                           Diltiazem (CARDIZEM CD)              Omeprazole (PRILOSEC)              PARoxetine (PAXIL)               Hydroxypropyl methylcellulose / hypromellose (ISOPTO TEARS / GONIOVISC)                                    As needed :Acetaminophen (TYLENOL), COLCR,  Phenazopyridine (PYRIDIUM0,Loratidine (Claritin),NitroGLYCERIN (NITROSTAT)                     Follow your surgeon's instructions on when to stop Asprin .  If no instructions were given by your surgeon then you will need to call the office to get those instructions.               Take last dose of Xarelto on 01/16/19.              As of today STOP taking any Aspirin (unless otherwise instructed by your surgeon), Aleve, Naproxen, Ibuprofen, Motrin, Advil, Goody's, BC's, all herbal medications, fish oil, and all vitamins.                           Do not wear jewelry, make-up or nail polish.  Do not wear lotions, powders, or colognes, or deodorant.  Do not shave 48 hours prior to surgery.  Men may shave face and neck.  Do not bring valuables to the hospital.  [facility] is not responsible for any belongings or valuables.  Contacts, dentures or bridgework may not be worn into surgery.  Leave your suitcase in the car.  After surgery it may be brought to your room.  For patients admitted to the hospital, discharge time  will be determined by your treatment team.  Patients discharged the day of surgery will not be allowed to drive home.   [facility]- Preparing For Surgery  Before surgery, you can play an important role. Because skin is not sterile, your skin needs to be as free of germs as possible. You can reduce the number of germs on your skin by washing with CHG (chlorahexidine gluconate) Soap before surgery.  CHG is an antiseptic cleaner which kills germs and bonds with the skin to continue killing germs even after washing.    Oral Hygiene is also important to reduce  your risk of infection.  Remember - BRUSH YOUR TEETH THE MORNING OF SURGERY WITH YOUR REGULAR TOOTHPASTE  Please do not use if you have an allergy to CHG or antibacterial soaps. If your skin becomes reddened/irritated stop using the CHG.  Do not shave (including legs and underarms) for at least 48 hours prior to first CHG shower. It is OK to shave your face.  Please follow these instructions carefully.   1. Shower the NIGHT BEFORE SURGERY and the MORNING OF SURGERY with CHG.   2. If you chose to wash your hair, wash your hair first as usual with your normal shampoo.  3. After you shampoo, rinse your hair and body thoroughly to remove the shampoo.  4. Use CHG as you would any other liquid soap. You can apply CHG directly to the skin and wash gently with a scrungie or a clean washcloth.   5. Apply the CHG Soap to your body ONLY FROM THE NECK DOWN.  Do not use on open wounds or open sores. Avoid contact with your eyes, ears, mouth and genitals (private parts). Wash Face and genitals (private parts)  with your normal soap.  6. Wash thoroughly, paying special attention to the area where your surgery will be performed.  7. Thoroughly rinse your body with warm water from the neck down.  8. DO NOT shower/wash with your normal soap after using and rinsing off the CHG Soap.  9. Pat yourself dry with a CLEAN TOWEL.  10. Wear CLEAN PAJAMAS to  bed the night before surgery, wear comfortable clothes the morning of surgery  11. Place CLEAN SHEETS on your bed the night of your first shower and DO NOT SLEEP WITH PETS.  Day of Surgery:  Do not apply any deodorants/lotions.  Please wear clean clothes to the hospital/surgery center.   Remember to brush your teeth WITH YOUR REGULAR TOOTHPASTE.  Please read over the following fact sheets that you were given. Pain Booklet, Coughing and Deep Breathing and Surgical Site Infection Prevention

## 2019-01-17 DIAGNOSIS — M79672 Pain in left foot: Secondary | ICD-10-CM | POA: Diagnosis not present

## 2019-01-17 DIAGNOSIS — Z6832 Body mass index (BMI) 32.0-32.9, adult: Secondary | ICD-10-CM | POA: Diagnosis not present

## 2019-01-17 DIAGNOSIS — G4733 Obstructive sleep apnea (adult) (pediatric): Secondary | ICD-10-CM | POA: Diagnosis not present

## 2019-01-17 DIAGNOSIS — I2510 Atherosclerotic heart disease of native coronary artery without angina pectoris: Secondary | ICD-10-CM | POA: Diagnosis not present

## 2019-01-17 DIAGNOSIS — E785 Hyperlipidemia, unspecified: Secondary | ICD-10-CM | POA: Diagnosis not present

## 2019-01-17 DIAGNOSIS — I10 Essential (primary) hypertension: Secondary | ICD-10-CM | POA: Diagnosis not present

## 2019-01-17 DIAGNOSIS — M1A9XX0 Chronic gout, unspecified, without tophus (tophi): Secondary | ICD-10-CM | POA: Diagnosis not present

## 2019-01-17 DIAGNOSIS — I4891 Unspecified atrial fibrillation: Secondary | ICD-10-CM | POA: Diagnosis not present

## 2019-01-17 NOTE — Anesthesia Preprocedure Evaluation (Addendum)
Anesthesia Evaluation  Patient identified by MRN, date of birth, ID band Patient awake    Reviewed: Allergy & Precautions, NPO status , Patient's Chart, lab work & pertinent test results  History of Anesthesia Complications (+) history of anesthetic complications  Airway Mallampati: II  TM Distance: >3 FB Neck ROM: Full    Dental  (+) Dental Advisory Given   Pulmonary sleep apnea ,    Pulmonary exam normal breath sounds clear to auscultation       Cardiovascular hypertension, + CAD  + dysrhythmias  Rhythm:Regular Rate:Normal     Neuro/Psych negative neurological ROS  negative psych ROS   GI/Hepatic Neg liver ROS, GERD  ,  Endo/Other  negative endocrine ROS  Renal/GU Renal disease     Musculoskeletal negative musculoskeletal ROS (+)   Abdominal   Peds  Hematology negative hematology ROS (+)   Anesthesia Other Findings   Reproductive/Obstetrics                            Anesthesia Physical Anesthesia Plan  ASA: III  Anesthesia Plan: Regional   Post-op Pain Management:    Induction: Intravenous  PONV Risk Score and Plan: 3 and Ondansetron, Dexamethasone and Treatment may vary due to age or medical condition  Airway Management Planned: Natural Airway  Additional Equipment: None  Intra-op Plan:   Post-operative Plan:   Informed Consent: I have reviewed the patients History and Physical, chart, labs and discussed the procedure including the risks, benefits and alternatives for the proposed anesthesia with the patient or authorized representative who has indicated his/her understanding and acceptance.     Dental advisory given  Plan Discussed with: CRNA  Anesthesia Plan Comments: (Cardiac clearance per telephone encounter 01/14/19 "Pt has known CAD s/p remote stenting of the OM and known LAD disease treated medically, also Atrial fibrillation on Xarelto. Patient was contacted  01/14/2019 in reference to pre-operative risk assessment for pending surgery as outlined below.  Sohum Delillo was last seen on 11/22/18 by Dr. [name].  Since that day, Lekeith Wulf has done well w/o any anginal symptomatolgy. He is able to complete > 4 METs of physical activity w/o exertional CP or dyspnea. He works out at [employer] doing moderate aerobic exercise and light weight exercises w/o symptoms or limitations.  According to the Revised Cardiac Risk Index (RCRI), perioperative risk of major cardiac events is 0.9%.  Therefore, based on ACC/AHA guidelines, the patient would be at acceptable risk for the planned procedure without further cardiovascular testing. Per pharmacy, ok to hold Xarelto 2 days prior to surgery. 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 The ascending aorta measures 4.0 cm and the aortic root at 4.4 cm, normal LA size. No significant valvular abnormalities. -Nuclear stress 08/06/17 showed EF: 55%, there is a defect present in the mid inferolateral, apical inferior, apical lateral and apex location. Findings consistent with ischemia in the apical inferolateral region. There is a small fixed defect in the rapical inferolateral region. This is an intermediate risk study.   )      Anesthesia Quick Evaluation

## 2019-01-18 ENCOUNTER — Encounter (HOSPITAL_BASED_OUTPATIENT_CLINIC_OR_DEPARTMENT_OTHER): Payer: Medicare Other

## 2019-01-18 NOTE — Telephone Encounter (Signed)
"  I'm calling to see if you need anything else from me."  We have everything we need.

## 2019-01-18 NOTE — Progress Notes (Addendum)
Pt contacted. Patient denies domestic or international travel in the past month. Denies cough, cold, flu like symptom.  Pt reports support person is also absent of symptoms and had not traveled domestic or international in the past month.

## 2019-01-19 ENCOUNTER — Other Ambulatory Visit: Payer: Self-pay

## 2019-01-19 ENCOUNTER — Other Ambulatory Visit: Payer: Self-pay | Admitting: Podiatry

## 2019-01-19 ENCOUNTER — Encounter: Payer: Self-pay | Admitting: Podiatry

## 2019-01-19 ENCOUNTER — Encounter (HOSPITAL_COMMUNITY)
Admission: RE | Disposition: A | Discharge status: Home / Self Care | Payer: Self-pay | Source: Home / Self Care | Attending: Podiatry

## 2019-01-19 ENCOUNTER — Encounter (HOSPITAL_COMMUNITY): Payer: Self-pay

## 2019-01-19 ENCOUNTER — Telehealth: Payer: Self-pay | Admitting: Podiatry

## 2019-01-19 ENCOUNTER — Ambulatory Visit (HOSPITAL_COMMUNITY): Payer: Medicare Other | Admitting: Physician Assistant

## 2019-01-19 ENCOUNTER — Ambulatory Visit (HOSPITAL_COMMUNITY)
Admission: RE | Admit: 2019-01-19 | Discharge: 2019-01-19 | Disposition: A | Discharge status: Home / Self Care | Payer: Medicare Other | Attending: Podiatry | Admitting: Podiatry

## 2019-01-19 ENCOUNTER — Ambulatory Visit (HOSPITAL_COMMUNITY): Payer: Medicare Other | Admitting: Anesthesiology

## 2019-01-19 DIAGNOSIS — I4891 Unspecified atrial fibrillation: Secondary | ICD-10-CM | POA: Diagnosis not present

## 2019-01-19 DIAGNOSIS — Z79899 Other long term (current) drug therapy: Secondary | ICD-10-CM | POA: Diagnosis not present

## 2019-01-19 DIAGNOSIS — M2032 Hallux varus (acquired), left foot: Secondary | ICD-10-CM | POA: Diagnosis not present

## 2019-01-19 DIAGNOSIS — Z7982 Long term (current) use of aspirin: Secondary | ICD-10-CM | POA: Insufficient documentation

## 2019-01-19 DIAGNOSIS — L97529 Non-pressure chronic ulcer of other part of left foot with unspecified severity: Secondary | ICD-10-CM | POA: Diagnosis not present

## 2019-01-19 DIAGNOSIS — M79672 Pain in left foot: Secondary | ICD-10-CM | POA: Diagnosis not present

## 2019-01-19 DIAGNOSIS — D2372 Other benign neoplasm of skin of left lower limb, including hip: Secondary | ICD-10-CM | POA: Diagnosis not present

## 2019-01-19 DIAGNOSIS — I10 Essential (primary) hypertension: Secondary | ICD-10-CM | POA: Diagnosis not present

## 2019-01-19 DIAGNOSIS — K219 Gastro-esophageal reflux disease without esophagitis: Secondary | ICD-10-CM | POA: Insufficient documentation

## 2019-01-19 DIAGNOSIS — L84 Corns and callosities: Secondary | ICD-10-CM | POA: Insufficient documentation

## 2019-01-19 DIAGNOSIS — R2242 Localized swelling, mass and lump, left lower limb: Secondary | ICD-10-CM | POA: Insufficient documentation

## 2019-01-19 DIAGNOSIS — G4730 Sleep apnea, unspecified: Secondary | ICD-10-CM | POA: Diagnosis not present

## 2019-01-19 DIAGNOSIS — M205X2 Other deformities of toe(s) (acquired), left foot: Secondary | ICD-10-CM | POA: Insufficient documentation

## 2019-01-19 DIAGNOSIS — M109 Gout, unspecified: Secondary | ICD-10-CM | POA: Diagnosis not present

## 2019-01-19 DIAGNOSIS — D492 Neoplasm of unspecified behavior of bone, soft tissue, and skin: Secondary | ICD-10-CM | POA: Diagnosis not present

## 2019-01-19 DIAGNOSIS — Z7901 Long term (current) use of anticoagulants: Secondary | ICD-10-CM | POA: Insufficient documentation

## 2019-01-19 HISTORY — PX: MASS EXCISION: SHX2000

## 2019-01-19 HISTORY — PX: DISTAL INTERPHALANGEAL JOINT FUSION: SHX6428

## 2019-01-19 LAB — PROTIME-INR
INR: 1.1 (ref 0.8–1.2)
PROTHROMBIN TIME: 13.6 s (ref 11.4–15.2)

## 2019-01-19 SURGERY — DISTAL INTERPHALANGEAL JOINT FUSION
Anesthesia: Regional | Site: Foot | Laterality: Left

## 2019-01-19 MED ORDER — CEPHALEXIN 500 MG PO CAPS: 500.0000 mg | ORAL_CAPSULE | Freq: Three times a day (TID) | ORAL | 0 refills | Status: DC

## 2019-01-19 MED ORDER — MIDAZOLAM HCL 2 MG/2ML IJ SOLN
INTRAMUSCULAR | Status: AC
  Filled 2019-01-19: qty 2

## 2019-01-19 MED ORDER — MEPERIDINE HCL 50 MG PO TABS: 50.0000 mg | ORAL_TABLET | Freq: Four times a day (QID) | ORAL | 0 refills | Status: DC | PRN

## 2019-01-19 MED ORDER — LIDOCAINE 2% (20 MG/ML) 5 ML SYRINGE
INTRAMUSCULAR | Status: DC | PRN
  Administered 2019-01-19: 60 mg via INTRAVENOUS

## 2019-01-19 MED ORDER — CEFAZOLIN SODIUM-DEXTROSE 2-4 GM/100ML-% IV SOLN
2.0000 g | INTRAVENOUS | Status: AC
  Administered 2019-01-19: 2 g via INTRAVENOUS

## 2019-01-19 MED ORDER — FENTANYL CITRATE (PF) 100 MCG/2ML IJ SOLN
INTRAMUSCULAR | Status: AC
  Filled 2019-01-19: qty 2

## 2019-01-19 MED ORDER — CHLORHEXIDINE GLUCONATE CLOTH 2 % EX PADS: 6.0000 | MEDICATED_PAD | Freq: Once | CUTANEOUS | Status: DC

## 2019-01-19 MED ORDER — HYDROMORPHONE HCL 2 MG PO TABS: 1.0000 mg | ORAL_TABLET | ORAL | 0 refills | Status: DC | PRN

## 2019-01-19 MED ORDER — ONDANSETRON HCL 4 MG/2ML IJ SOLN
INTRAMUSCULAR | Status: DC | PRN
  Administered 2019-01-19: 4 mg via INTRAVENOUS

## 2019-01-19 MED ORDER — BUPIVACAINE HCL (PF) 0.5 % IJ SOLN
INTRAMUSCULAR | Status: DC | PRN
  Administered 2019-01-19: 30 mL via PERINEURAL

## 2019-01-19 MED ORDER — LACTATED RINGERS IV SOLN
INTRAVENOUS | Status: DC
  Administered 2019-01-19: 10:00:00 via INTRAVENOUS

## 2019-01-19 MED ORDER — BUPIVACAINE HCL (PF) 0.5 % IJ SOLN
INTRAMUSCULAR | Status: AC
  Filled 2019-01-19: qty 30

## 2019-01-19 MED ORDER — LIDOCAINE HCL 2 % IJ SOLN
INTRAMUSCULAR | Status: DC | PRN
  Administered 2019-01-19: 20 mL

## 2019-01-19 MED ORDER — BUPIVACAINE HCL (PF) 0.5 % IJ SOLN
INTRAMUSCULAR | Status: DC | PRN
  Administered 2019-01-19: 30 mL

## 2019-01-19 MED ORDER — MEPERIDINE HCL 50 MG/ML IJ SOLN: 6.2500 mg | INTRAMUSCULAR | Status: DC | PRN

## 2019-01-19 MED ORDER — PROPOFOL 10 MG/ML IV BOLUS
INTRAVENOUS | Status: AC
  Filled 2019-01-19: qty 40

## 2019-01-19 MED ORDER — MIDAZOLAM HCL 5 MG/5ML IJ SOLN
INTRAMUSCULAR | Status: DC | PRN
  Administered 2019-01-19: 1 mg via INTRAVENOUS

## 2019-01-19 MED ORDER — 0.9 % SODIUM CHLORIDE (POUR BTL) OPTIME
TOPICAL | Status: DC | PRN
  Administered 2019-01-19: 1000 mL

## 2019-01-19 MED ORDER — FENTANYL CITRATE (PF) 250 MCG/5ML IJ SOLN
INTRAMUSCULAR | Status: AC
  Filled 2019-01-19: qty 5

## 2019-01-19 MED ORDER — PROMETHAZINE HCL 25 MG/ML IJ SOLN: 6.2500 mg | INTRAMUSCULAR | Status: DC | PRN

## 2019-01-19 MED ORDER — FENTANYL CITRATE (PF) 100 MCG/2ML IJ SOLN
INTRAMUSCULAR | Status: DC | PRN
  Administered 2019-01-19: 50 ug via INTRAVENOUS

## 2019-01-19 MED ORDER — CLONIDINE HCL (ANALGESIA) 100 MCG/ML EP SOLN
EPIDURAL | Status: DC | PRN
  Administered 2019-01-19: 75 ug

## 2019-01-19 MED ORDER — ONDANSETRON HCL 4 MG/2ML IJ SOLN
INTRAMUSCULAR | Status: AC
  Filled 2019-01-19: qty 2

## 2019-01-19 MED ORDER — HYDROMORPHONE HCL 1 MG/ML IJ SOLN: 0.2500 mg | INTRAMUSCULAR | Status: DC | PRN

## 2019-01-19 MED ORDER — PROMETHAZINE HCL 25 MG PO TABS: 25.0000 mg | ORAL_TABLET | Freq: Three times a day (TID) | ORAL | 0 refills | Status: DC | PRN

## 2019-01-19 MED ORDER — LIDOCAINE HCL 2 % IJ SOLN
INTRAMUSCULAR | Status: AC
  Filled 2019-01-19: qty 20

## 2019-01-19 MED ORDER — CEFAZOLIN SODIUM-DEXTROSE 2-4 GM/100ML-% IV SOLN
INTRAVENOUS | Status: AC
  Filled 2019-01-19: qty 100

## 2019-01-19 MED ORDER — PROPOFOL 500 MG/50ML IV EMUL
INTRAVENOUS | Status: DC | PRN
  Administered 2019-01-19: 75 ug/kg/min via INTRAVENOUS

## 2019-01-19 MED ORDER — PROPOFOL 1000 MG/100ML IV EMUL
INTRAVENOUS | Status: AC
  Filled 2019-01-19: qty 100

## 2019-01-19 SURGICAL SUPPLY — 50 items
BANDAGE ACE 3X5.8 VEL STRL LF (GAUZE/BANDAGES/DRESSINGS) ×1 IMPLANT
BANDAGE ACE 4X5 VEL STRL LF (GAUZE/BANDAGES/DRESSINGS) ×2 IMPLANT
BIT DRILL LNG QR ACUTRAK 2 (BIT) ×2 IMPLANT
BLADE AVERAGE 25MMX9MM (BLADE) ×1
BLADE AVERAGE 25X9 (BLADE) ×2 IMPLANT
BLADE SURG 15 STRL LF DISP TIS (BLADE) ×2 IMPLANT
BLADE SURG 15 STRL SS (BLADE) ×3
BNDG CMPR 9X4 STRL LF SNTH (GAUZE/BANDAGES/DRESSINGS) ×1
BNDG CONFORM 2 STRL LF (GAUZE/BANDAGES/DRESSINGS) ×1 IMPLANT
BNDG ESMARK 4X9 LF (GAUZE/BANDAGES/DRESSINGS) ×3 IMPLANT
BNDG GAUZE ELAST 4 BULKY (GAUZE/BANDAGES/DRESSINGS) ×3 IMPLANT
COVER BACK TABLE 60X90IN (DRAPES) ×1 IMPLANT
COVER WAND RF STERILE (DRAPES) ×1 IMPLANT
CUFF TOURNIQUET SINGLE 18IN (TOURNIQUET CUFF) ×2 IMPLANT
DRAPE HALF SHEET 40X57 (DRAPES) ×3 IMPLANT
DRAPE OEC MINIVIEW 54X84 (DRAPES) ×2 IMPLANT
DRSG EMULSION OIL 3X3 NADH (GAUZE/BANDAGES/DRESSINGS) ×3 IMPLANT
DURAPREP 26ML APPLICATOR (WOUND CARE) ×1 IMPLANT
ELECT REM PT RETURN 9FT ADLT (ELECTROSURGICAL) ×3
ELECTRODE REM PT RTRN 9FT ADLT (ELECTROSURGICAL) ×1 IMPLANT
GAUZE 4X4 16PLY RFD (DISPOSABLE) IMPLANT
GAUZE SPONGE 4X4 12PLY STRL (GAUZE/BANDAGES/DRESSINGS) ×3 IMPLANT
GLOVE BIO SURGEON STRL SZ7.5 (GLOVE) ×8 IMPLANT
GOWN STRL REUS W/ TWL LRG LVL3 (GOWN DISPOSABLE) ×1 IMPLANT
GOWN STRL REUS W/ TWL XL LVL3 (GOWN DISPOSABLE) ×1 IMPLANT
GOWN STRL REUS W/TWL LRG LVL3 (GOWN DISPOSABLE) ×3
GOWN STRL REUS W/TWL XL LVL3 (GOWN DISPOSABLE) ×3
GUIDEWIRE ORTHO 0.054X7 SS (WIRE) ×2 IMPLANT
KIT BASIN OR (CUSTOM PROCEDURE TRAY) ×3 IMPLANT
NDL HYPO 25X1 1.5 SAFETY (NEEDLE) ×1 IMPLANT
NDL SAFETY ECLIPSE 18X1.5 (NEEDLE) IMPLANT
NEEDLE HYPO 18GX1.5 SHARP (NEEDLE)
NEEDLE HYPO 25X1 1.5 SAFETY (NEEDLE) ×3 IMPLANT
NS IRRIG 1000ML POUR BTL (IV SOLUTION) ×2 IMPLANT
PACK ORTHO EXTREMITY (CUSTOM PROCEDURE TRAY) ×2 IMPLANT
PADDING CAST ABS 4INX4YD NS (CAST SUPPLIES)
PADDING CAST ABS COTTON 4X4 ST (CAST SUPPLIES) ×1 IMPLANT
PENCIL BUTTON HOLSTER BLD 10FT (ELECTRODE) ×1 IMPLANT
SCREW ACUTRAK 2 STD 34MM (Screw) ×2 IMPLANT
STOCKINETTE 6  STRL (DRAPES) ×2
STOCKINETTE 6 STRL (DRAPES) ×1 IMPLANT
SUT MERSILENE 4-0 S-2 (SUTURE) ×1 IMPLANT
SUT MNCRL AB 3-0 PS2 18 (SUTURE) ×5 IMPLANT
SUT MNCRL AB 4-0 PS2 18 (SUTURE) ×2 IMPLANT
SUT MON AB 5-0 PS2 18 (SUTURE) ×3 IMPLANT
SUT PROLENE 4 0 PS 2 18 (SUTURE) ×6 IMPLANT
SYR 10ML LL (SYRINGE) IMPLANT
SYR BULB 3OZ (MISCELLANEOUS) ×1 IMPLANT
SYR CONTROL 10ML LL (SYRINGE) ×2 IMPLANT
UNDERPAD 30X30 (UNDERPADS AND DIAPERS) ×3 IMPLANT

## 2019-01-19 NOTE — Transfer of Care (Signed)
Immediate Anesthesia Transfer of Care Note  Patient: Jose Stone  Procedure(s) Performed: HALLUX INTERPHANGEAL JOINT FUSION LEFT FOOT (Left Foot) EXCISION BENIGN LESION 2.OCM LEFT FOOT (Left Foot)  Patient Location: PACU  Anesthesia Type:MAC and Regional  Level of Consciousness: drowsy and patient cooperative  Airway & Oxygen Therapy: Patient Spontanous Breathing and Patient connected to face mask oxygen  Post-op Assessment: Report given to RN and Post -op Vital signs reviewed and stable  Post vital signs: Reviewed and stable  Last Vitals:  Vitals Value Taken Time  BP 104/63 01/19/2019 12:02 PM  Temp 36.5 C 01/19/2019 12:02 PM  Pulse 51 01/19/2019 12:04 PM  Resp 8 01/19/2019 12:04 PM  SpO2 96 % 01/19/2019 12:04 PM  Vitals shown include unvalidated device data.  Last Pain:  Vitals:   01/19/19 1202  TempSrc:   PainSc: 0-No pain      Patients Stated Pain Goal: 4 (75/[id number]/32 [id number])  Complications: No apparent anesthesia complications

## 2019-01-19 NOTE — Progress Notes (Signed)
Orthopedic Tech Progress Note Patient Details:  Jose Stone 1946/05/09 [id number]  Ortho Devices Type of Ortho Device: CAM walker Ortho Device/Splint Location: LLE Ortho Device/Splint Interventions: Adjustment, Application, Ordered   Post Interventions Patient Tolerated: Well Instructions Provided: Care of device   [name] J Tyreece Gelles 01/19/2019, 12:45 PM

## 2019-01-19 NOTE — Progress Notes (Signed)
Subjective: Mr. 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 Due to the reoccurance of the ulcer/callus he wants to proceed with surgery. Also he has a soft tissue lesion on the same foot that has been causing pain and he wants to have this removed. He is with his wife today and no further question.  Denies any systemic complaints such as fevers, chills, nausea, vomiting. No acute changes since last appointment, and no other complaints at this time.   Objective: AAO x3, NAD DP/PT pulses palpable bilaterally, CRT less than 3 seconds Chronic callus on the plantar medial hallux is present but no surrounding erythema, ascending cellulitis. No malodor. There is a soft tissue lesion about 1.5 cm on the medial foot proximal to the MTPJ.  No open lesions or pre-ulcerative lesions.  No pain with calf compression, swelling, warmth, erythema  Assessment: Soft tissue lesion left foot, chronic callus/ulcer  Plan: -All treatment options discussed with the patient including all alternatives, risks, complications.  -Again reviewed the surgery and postop course. All alternatives, risks, complications discussed. Consent form signed.  -NPO -No further questions or concerns.  -Pop block by anesthesia.  -Patient encouraged to call the office with any questions, concerns, change in symptoms.    Celesta Gentile, DPM

## 2019-01-19 NOTE — Telephone Encounter (Signed)
Sent dilaudid to the pharmacy

## 2019-01-19 NOTE — Telephone Encounter (Signed)
Pt had surgery this morning and the prescription sent in to the pharmacy is no longer available. The pharmacy is calling to have the prescription changed. Please give them a call.

## 2019-01-19 NOTE — Telephone Encounter (Signed)
I informed pt of Dr. Leigh Aurora change of pain medication ordered had been sent to the pharmacy.

## 2019-01-19 NOTE — Anesthesia Procedure Notes (Signed)
Anesthesia Regional Block: Popliteal block   Pre-Anesthetic Checklist: ,, timeout performed, Correct Patient, Correct Site, Correct Laterality, Correct Procedure, Correct Position, site marked, Risks and benefits discussed,  Surgical consent,  Pre-op evaluation,  At surgeon's request and post-op pain management  Laterality: Left  Prep: chloraprep       Needles:  Injection technique: Single-shot  Needle Type: Stimiplex     Needle Length: 10cm  Needle Gauge: 21     Additional Needles:   Procedures:,,,, ultrasound used (permanent image in chart),,,,  Motor weakness within 5 minutes.   Nerve Stimulator or Paresthesia:  Response: Plantar flexion/toe flexion, 0.5 mA,   Additional Responses:   Narrative:  Start time: 01/19/2019 9:58 AM End time: 01/19/2019 10:01 AM Injection made incrementally with aspirations every 5 mL.  Performed by: Personally  Anesthesiologist: Nolon Nations, MD  Additional Notes: Nerve located and needle positioned with direct ultrasound guidance. Good perineural spread. Patient tolerated well.

## 2019-01-19 NOTE — Discharge Instructions (Signed)
See written instruction Continue all home medications as before surgery.

## 2019-01-20 ENCOUNTER — Encounter (HOSPITAL_COMMUNITY): Payer: Self-pay | Admitting: Podiatry

## 2019-01-20 ENCOUNTER — Telehealth: Payer: Self-pay | Admitting: Podiatry

## 2019-01-20 ENCOUNTER — Ambulatory Visit (INDEPENDENT_AMBULATORY_CARE_PROVIDER_SITE_OTHER): Payer: Self-pay

## 2019-01-20 DIAGNOSIS — M2012 Hallux valgus (acquired), left foot: Secondary | ICD-10-CM

## 2019-01-20 NOTE — Anesthesia Postprocedure Evaluation (Signed)
Anesthesia Post Note  Patient: Jose Stone  Procedure(s) Performed: HALLUX INTERPHANGEAL JOINT FUSION LEFT FOOT (Left Foot) EXCISION BENIGN LESION 2.OCM LEFT FOOT (Left Foot)     Patient location during evaluation: PACU Anesthesia Type: Regional Level of consciousness: awake and alert Pain management: pain level controlled Vital Signs Assessment: post-procedure vital signs reviewed and stable Respiratory status: spontaneous breathing Cardiovascular status: stable Anesthetic complications: no    Last Vitals:  Vitals:   01/19/19 1217 01/19/19 1230  BP: 116/68 122/73  Pulse: 63 (!) 56  Resp: 15 18  Temp:    SpO2: 99% 97%    Last Pain:  Vitals:   01/19/19 1230  TempSrc:   PainSc: 0-No pain                 Nolon Nations

## 2019-01-20 NOTE — Telephone Encounter (Signed)
I called pt on the home phone, pt states after finishing in recovery the dressing was reinforced there, and now has soaked through that dressing as well as the dressing they have applied and is still wet. I instructed pt to come to the office now an Gretta Arab, RN would assist.

## 2019-01-20 NOTE — Telephone Encounter (Addendum)
Left message on mobile phone informing pt that if the area was bright red and moist with blood on the ace we would like to see him today, but if he had removed the ace and the gauze had bright red blood we would need to see him today as soon as possible.

## 2019-01-20 NOTE — Telephone Encounter (Signed)
I had surgery yesterday and the surgery site is still bright red. Please call me back.

## 2019-01-21 ENCOUNTER — Telehealth: Payer: Self-pay | Admitting: *Deleted

## 2019-01-21 NOTE — Telephone Encounter (Signed)
Pt states he was seen by Janett Billow, RN yesterday and the dressing was taken down to the original dressing and reinforced, today there is only a little drop of dried blood. Pt states he will state his aspirin and xarelto tomorrow.

## 2019-01-21 NOTE — Progress Notes (Signed)
Patient is here today with concern about bleeding through his postoperative dressing.  He had surgery on 01/19/2019 excision of benign lesion left, hallux IPJ fusion left.  Bandage remains intact no visible blood through the Ace bandage.  Removed Ace bandage and some of the surgical gauze.  Noted quarter to 50 sent size amount of blood on the medial side of the hallux toe bandage.  There were brown edges around the bleeding site.  This was all within normal limits and did not appear to be out of the ordinary for surgical procedure given that he just had surgery yesterday.  Most of the surgical gauze remained in place and were reinforced with dry sterile gauze.  Advised patient on signs and symptoms of bleeding and to call our office immediately, remain in his boot, rest and elevate his foot.  He is to follow-up with any acute symptom changes, and keep his appointment next week.

## 2019-01-21 NOTE — Telephone Encounter (Signed)
Left message on mobile phone requesting call with current postop status since I do not see that he came for the dressing check yesterday.

## 2019-01-21 NOTE — Op Note (Addendum)
Surgeon: Celesta Gentile, DPM Assistants: none Pre-operative diagnosis: Left foot chronic ulceration, hallux interphalangeus; soft tissue mass Post-operative diagnosis: same Procedure: Left foot hallux IPJ fusion; soft tissue mass excision Pathology: Specimen: soft tissue mass Pertinent Intra-op findings: see below Anesthesia: MAC/block Hemostasis: PAT @250mm  Hg EBL: minimal Materials: Accumed 18m 4.059mheadless compression screw Injectables: none Complications: none  Indications for surgery: Jose Stone had a chronic ulceration, callus on the right big toe.  He is attempted shoe modifications, orthotics despite this he continues to get a callus which will open up into the wound and get infected.  At this time the wound has been healed and he wants to do something to try to help prevent it from coming back.  Also he has noticed a soft tissue mass inside of his foot that he like to have removed.  He states that came up suddenly.  He thinks this is gout as he had gout previously.  We discussed the hallux IPJ fusion as well as soft tissue mass excision.  He wants to proceed with surgery understand this is not a guarantee that his symptoms are going to resolve. All alternatives, risks, complications were discussed with the patient detail. No promises or guarantees were given as to the outcome of the procedure and all questions were answered to best of my ability.   Procedure in detail: The patient was both verbally and visually identified by myself, the nursing staff, and anesthesia staff in the preoperative holding area. They were then transferred to the operating room and placed on the operative table in supine position.  In the preoperative area he had a popliteal nerve block performed.  This was done by anesthesia.  A well-padded pneumatic ankle tourniquet was placed on the right ankle.  Right lower extremities and scrubbed, prepped, draped in normal sterile fashion.  Timeout was performed.   The right lower extremity was exsanguinated with an Esmarch bandage and pneumatic ankle tourniquet was inflated to 250 mmHg.  At this time an S type incision was planned on the hallux IPJ.  Incision was made with 15 blade scalpel the epidermis and the dermis.  Subcutaneous tissues were bluntly and sharply dissected making sure to retract all vital neurovascular structures and all bleeders were cauterized as necessary.  At this time the extensor tendon was then transected level of the IPJ and reflected both proximally and distally.  At this time the head of the proximal phalanx and base of the distal phalanx was identified.  A sagittal bone saw was utilized in order to resect the joint. I also utilized the bone saw as well as a rasp in order to remove prominent bone spur off the medial aspect of the phalanx.  At this time once the bone was adequately resected a guidewire for an Acumed 4.0 millimeter screw was then inserted across the hallux IPJ in a retrograde fashion.  Fluoroscopy was utilized to confirm placement.  At this time a 34 mm Acumed screw was inserted over the guidewire and there was adequate compression found across the arthrodesis site.  The guidewire was removed and fluoroscopy was utilized confirm placement.  At this time incision was copiously irrigated with sterile saline hemostasis achieved.  Incision was closed in layered fashion.  The extensor tendon was reapproximated with 3-0 Monocryl followed by subcutaneous tissue with 4 Monocryl and the skin was then closed with 4-0 Prolene.  The distal incision was also made closed with a 4-0 Prolene.  Attention was then directed over the  medial aspect the foot along the area of the soft tissue mass.  Linear incision was made overlying the soft tissue mass.  Incision was made with a #15 scalpel for the epidermis and the dermis.  The subcutaneous tissues involving sharply dissected making sure to retract all vital neurovascular structures.  All bleeders  were cauterized as necessary.  Next utilizing tenotomy scissors I was able to dissect the soft tissue mass.  There was found to be 2 blood vessels coming into the lesion which I tied off with 3-0 Monocryl.  The lesion was then removed in total measure approximately 2 x 1 cm and was hard in nature. This did not appear to be gout. I sent this to pathology for evaluation.  Incision was copiously irrigated with sterile saline hemostasis was achieved.  The incision was then closed in a layered fashion with the subcutaneous tissue closed with 3-0 Monocryl and the skin was then closed with 4-0 Prolene in a horizontal mattress fashion.  X-ray was obtained intraoperative and 3 views were obtained to reveal that the hardware is intact and there is adequate compression across the arthrodesis site.   Xeroform was placed over all incisions followed by dry sterile dressing.  Tourniquet was released Immediate cap refill time to all the digits.  He was woke from anesthesia and found to tolerate the procedure well any complications.  He was transferred to PACU with Vital signs stable and Vascular Status Intact.  Celesta Gentile, DPM

## 2019-01-21 NOTE — Telephone Encounter (Signed)
-----   Message from Trula Slade, DPM sent at 01/21/2019  7:21 AM EDT ----- Val- I was going through his chart and saw that he called yesterday. Doesn't look like he came in for the dressing change. Can you call him to see how he is doing? Thanks.

## 2019-01-22 NOTE — Telephone Encounter (Signed)
Thank you :)

## 2019-01-25 ENCOUNTER — Other Ambulatory Visit: Payer: Self-pay

## 2019-01-25 ENCOUNTER — Ambulatory Visit (INDEPENDENT_AMBULATORY_CARE_PROVIDER_SITE_OTHER): Payer: Medicare Other | Admitting: Podiatry

## 2019-01-25 ENCOUNTER — Ambulatory Visit (INDEPENDENT_AMBULATORY_CARE_PROVIDER_SITE_OTHER): Payer: Medicare Other

## 2019-01-25 DIAGNOSIS — M2012 Hallux valgus (acquired), left foot: Secondary | ICD-10-CM

## 2019-01-25 DIAGNOSIS — L84 Corns and callosities: Secondary | ICD-10-CM

## 2019-01-25 DIAGNOSIS — D219 Benign neoplasm of connective and other soft tissue, unspecified: Secondary | ICD-10-CM

## 2019-01-31 DIAGNOSIS — D219 Benign neoplasm of connective and other soft tissue, unspecified: Secondary | ICD-10-CM | POA: Insufficient documentation

## 2019-01-31 NOTE — Progress Notes (Signed)
Subjective: Jose Stone is a 73 y.o. is seen today in office s/p left foot hallux IPJ fusion with excision of soft tissue mass preformed on 01/19/2019.  He states that overall his pain has been minimal.  He is remained in the cam boot.  He did have come to the office for a bandage change but since then he has had no issues.  He has no new concerns. Denies any systemic complaints such as fevers, chills, nausea, vomiting. No calf pain, chest pain, shortness of breath.   Objective: General: No acute distress, AAOx3  DP/PT pulses palpable 2/4, CRT < 3 sec to all digits.  LEFT foot: Incision is well coapted without any evidence of dehiscence and sutures are intact. There is no surrounding erythema, ascending cellulitis, fluctuance, crepitus, malodor, drainage/purulence. There is mild edema around the surgical site. There is no pain along the surgical site.  No other areas of tenderness to bilateral lower extremities.  No other open lesions or pre-ulcerative lesions.  No pain with calf compression, swelling, warmth, erythema.   Assessment and Plan:  Status post left foot surgery, doing well with no complications   -Treatment options discussed including all alternatives, risks, and complications -X-rays were obtained reviewed.  Hardware intact the hallux IPJ.  Minimal gapping present on the hallux IPJ. -Antibiotic ointment and a bandage was applied.  Keep the dressing clean, dry, intact. -Remain in cam boot. -Reviewed pathology results with him I gave him a copy the results today. -Ice, elevation -Pain medication as needed. -Monitor for any clinical signs or symptoms of infection and DVT/PE and directed to call the office immediately should any occur or go to the ER. -Follow-up as scheduled for possible suture removal or sooner if any problems arise. In the meantime, encouraged to call the office with any questions, concerns, change in symptoms.   Celesta Gentile, DPM

## 2019-02-03 ENCOUNTER — Ambulatory Visit (INDEPENDENT_AMBULATORY_CARE_PROVIDER_SITE_OTHER): Payer: Medicare Other | Admitting: Podiatry

## 2019-02-03 ENCOUNTER — Other Ambulatory Visit: Payer: Self-pay

## 2019-02-03 ENCOUNTER — Encounter: Payer: Self-pay | Admitting: Podiatry

## 2019-02-03 VITALS — Temp 97.7°F

## 2019-02-03 DIAGNOSIS — L84 Corns and callosities: Secondary | ICD-10-CM

## 2019-02-03 DIAGNOSIS — M2012 Hallux valgus (acquired), left foot: Secondary | ICD-10-CM

## 2019-02-03 DIAGNOSIS — L988 Other specified disorders of the skin and subcutaneous tissue: Secondary | ICD-10-CM | POA: Diagnosis not present

## 2019-02-03 DIAGNOSIS — D485 Neoplasm of uncertain behavior of skin: Secondary | ICD-10-CM | POA: Diagnosis not present

## 2019-02-03 DIAGNOSIS — Z85820 Personal history of malignant melanoma of skin: Secondary | ICD-10-CM | POA: Diagnosis not present

## 2019-02-03 NOTE — Progress Notes (Signed)
Subjective: Jose Stone is a 73 y.o. is seen today in office s/p left foot hallux IPJ fusion with excision of soft tissue mass preformed on 01/19/2019.  He has been doing well and he has not been taking any pain medication. He states he has remained in the CAM boot. Denies any systemic complaints such as fevers, chills, nausea, vomiting. No calf pain, chest pain, shortness of breath.   Objective: General: No acute distress, AAOx3  DP/PT pulses palpable 2/4, CRT < 3 sec to all digits.  LEFT foot: Incision is well coapted without any evidence of dehiscence and sutures are intact.  There is some mild motion across the incision site.  Mild swelling but no significant erythema.  There is no drainage or pus coming incision no clinical signs of infection. No other areas of tenderness to bilateral lower extremities.  No other open lesions or pre-ulcerative lesions.  No pain with calf compression, swelling, warmth, erythema.   Assessment and Plan:  Status post left foot surgery, doing well with no complications   -Treatment options discussed including all alternatives, risks, and complications -There is also mild bruise across the incision site with the sutures intact today.  Antibiotic ointment and dressings applied.  Continue to wear cam boot at all times and limit activity.  Continue ice elevation.  Follow-up in 1 week for suture removal or sooner if needed.  Trula Slade DPM

## 2019-02-10 ENCOUNTER — Ambulatory Visit (INDEPENDENT_AMBULATORY_CARE_PROVIDER_SITE_OTHER): Payer: Medicare Other | Admitting: Podiatry

## 2019-02-10 ENCOUNTER — Other Ambulatory Visit: Payer: Self-pay

## 2019-02-10 ENCOUNTER — Encounter: Payer: Self-pay | Admitting: Podiatry

## 2019-02-10 VITALS — Temp 98.4°F

## 2019-02-10 DIAGNOSIS — D219 Benign neoplasm of connective and other soft tissue, unspecified: Secondary | ICD-10-CM

## 2019-02-10 DIAGNOSIS — L84 Corns and callosities: Secondary | ICD-10-CM

## 2019-02-10 DIAGNOSIS — M2012 Hallux valgus (acquired), left foot: Secondary | ICD-10-CM

## 2019-02-15 NOTE — Progress Notes (Signed)
Subjective: Jose Stone is a 73 y.o. is seen today in office s/p left foot hallux IPJ fusion with excision of soft tissue mass preformed on 01/19/2019.  He presents today for suture removal.  Overall he is doing well he still not taking any pain medication.  He is remained in the surgical boot.  Overall he feels well and he denies any fevers, chills, nausea, vomiting.  No calf pain, chest pain, shortness of breath.   Objective: General: No acute distress, AAOx3  DP/PT pulses palpable 2/4, CRT < 3 sec to all digits.  LEFT foot: Incision is well coapted without any evidence of dehiscence and sutures are intact.  The incision is doing well and healing without complications. There is no drainage or pus coming incision no clinical signs of infection. No other areas of tenderness to bilateral lower extremities.  No other open lesions or pre-ulcerative lesions.  No pain with calf compression, swelling, warmth, erythema.   Assessment and Plan:  Status post left foot surgery, doing well with no complications   -Treatment options discussed including all alternatives, risks, and complications -Sutures removed without any complications. After removal the incision remained well coapted. Steri-strips were applied for reinforcement. Antibiotic ointment was applied followed by DSD. He can start to get the incision wet tomorrow as long as the incision is continuing to heel well.  -Remain in surgical boot.  -Ice/elevation -Monitor for any clinical signs or symptoms of infection and directed to call the office immediately should any occur or go to the ER.  RTC 2 weeks or sooner if needed. X-ray next appointment  Trula Slade DPM

## 2019-02-24 ENCOUNTER — Other Ambulatory Visit: Payer: Self-pay

## 2019-02-24 ENCOUNTER — Ambulatory Visit (INDEPENDENT_AMBULATORY_CARE_PROVIDER_SITE_OTHER): Payer: Medicare Other

## 2019-02-24 ENCOUNTER — Ambulatory Visit (INDEPENDENT_AMBULATORY_CARE_PROVIDER_SITE_OTHER): Payer: Medicare Other | Admitting: Podiatry

## 2019-02-24 DIAGNOSIS — L84 Corns and callosities: Secondary | ICD-10-CM

## 2019-02-24 DIAGNOSIS — M2012 Hallux valgus (acquired), left foot: Secondary | ICD-10-CM | POA: Diagnosis not present

## 2019-02-25 ENCOUNTER — Telehealth: Payer: Self-pay

## 2019-02-25 NOTE — Telephone Encounter (Signed)

## 2019-02-28 ENCOUNTER — Telehealth: Payer: Self-pay | Admitting: Internal Medicine

## 2019-02-28 NOTE — Progress Notes (Signed)
Subjective: Jose Stone is a 73 y.o. is seen today in office s/p left foot hallux IPJ fusion with excision of soft tissue mass preformed on 01/19/2019.  Patient is been in the cam boot.  He has had no pain he is not taking any pain medication.  He did notice one area that was bleeding previously needing sutures along the area where there was a stitch.  Denies any open areas patient denies any pus or any redness or red streaks.  Still with some occasional swelling.  He denies any fevers, chills, vomiting.  No calf pain, chest pain, shortness of breath.   Objective: General: No acute distress, AAOx3  DP/PT pulses palpable 2/4, CRT < 3 sec to all digits.  LEFT foot: Incision is well coapted without any evidence of dehiscence and sutures are intact.  The incision is doing well and healing without complications.  There was 1 area on the hallux where there is a small scabbed over again had blood at 1 point but is not currently bleeding there is no drainage or pus.  Clinically there is no sign of infection.  Mild swelling.  There is no tenderness palpation.   Hyperkeratotic lesion left hallux IPJ.  On debridement there is no ongoing ulceration drainage or any signs of infection. No other areas of tenderness to bilateral lower extremities.  No other open lesions or pre-ulcerative lesions.  No pain with calf compression, swelling, warmth, erythema.   Assessment and Plan:  Status post left foot surgery, doing well with no complications   -Treatment options discussed including all alternatives, risks, and complications -X-ray was obtained and reviewed.  There is pain of the hallux IPJ.  On the part of the arthrodesis site there is starting to be some bone formation present on x-ray although mild. -Incision appears to be doing well at this time.  Patient antibiotic ointment dressing changes daily.  Continue with compression.  Continue cam boot ice elevation. -Acute hyperkeratotic tissue on the medial hallux  IPJ without any complications or bleeding. -Monitor for any clinical signs or symptoms of infection and directed to call the office immediately should any occur or go to the ER.  RTC 2 weeks or sooner if needed. X-ray next appointment  Trula Slade DPM

## 2019-03-01 ENCOUNTER — Telehealth (INDEPENDENT_AMBULATORY_CARE_PROVIDER_SITE_OTHER): Payer: Medicare Other | Admitting: Internal Medicine

## 2019-03-01 ENCOUNTER — Encounter: Payer: Self-pay | Admitting: Internal Medicine

## 2019-03-01 VITALS — BP 114/70 | HR 65 | Ht 73.0 in | Wt 242.0 lb

## 2019-03-01 DIAGNOSIS — Z01812 Encounter for preprocedural laboratory examination: Secondary | ICD-10-CM

## 2019-03-01 DIAGNOSIS — I712 Thoracic aortic aneurysm, without rupture, unspecified: Secondary | ICD-10-CM

## 2019-03-01 DIAGNOSIS — I4891 Unspecified atrial fibrillation: Secondary | ICD-10-CM

## 2019-03-01 DIAGNOSIS — I351 Nonrheumatic aortic (valve) insufficiency: Secondary | ICD-10-CM

## 2019-03-01 DIAGNOSIS — G4719 Other hypersomnia: Secondary | ICD-10-CM | POA: Diagnosis not present

## 2019-03-01 DIAGNOSIS — I480 Paroxysmal atrial fibrillation: Secondary | ICD-10-CM

## 2019-03-01 DIAGNOSIS — I77810 Thoracic aortic ectasia: Secondary | ICD-10-CM

## 2019-03-01 DIAGNOSIS — I10 Essential (primary) hypertension: Secondary | ICD-10-CM

## 2019-03-01 NOTE — Patient Instructions (Signed)
Medication Instructions:  Your Physician recommend you continue on your current medication as directed.    If you need a refill on your cardiac medications before your next appointment, please call your pharmacy.   Lab work: Your physician recommends that you return for lab work 1 week prior to procedure (BMP)  If you have labs (blood work) drawn today and your tests are completely normal, you will receive your results only by: Marland Kitchen MyChart Message (if you have MyChart) OR . A paper copy in the mail If you have any lab test that is abnormal or we need to change your treatment, we will call you to review the results.  Testing/Procedures: Your physician has requested that you have cardiac CT. Cardiac computed tomography (CT) is a painless test that uses an x-ray machine to take clear, detailed pictures of your heart. For further information please visit [facility]. Please follow instruction sheet as given. [address]   Follow-Up: At [facility], you and your health needs are our priority.  As part of our continuing mission to provide you with exceptional heart care, we have created designated Provider Care Teams.  These Care Teams include your primary Cardiologist (physician) and Advanced Practice Providers (APPs -  Physician Assistants and Nurse Practitioners) who all work together to provide you with the care you need, when you need it. You will need a follow up appointment in 6 months.  Please call our office 2 months in advance to schedule this appointment.  You may see [name], MD or one of the following Advanced Practice Providers on your designated Care Team: [place], [place] . Fabian Sharp, PA-C

## 2019-03-01 NOTE — Progress Notes (Signed)
Virtual Visit via Video Note   This visit type was conducted due to national recommendations for restrictions regarding the COVID-19 Pandemic (e.g. social distancing) in an effort to limit this patient's exposure and mitigate transmission in our community.  Due to his co-morbid illnesses, this patient is at least at moderate risk for complications without adequate follow up.  This format is felt to be most appropriate for this patient at this time.  All issues noted in this document were discussed and addressed.  A limited physical exam was performed with this format.  Please refer to the patient's chart for his consent to telehealth for [facility].   Evaluation Performed:  Doximity video visit  Date:  03/01/2019   ID:  Jose Stone, DOB 25-Apr-1946, MRN [id number]  Patient Location:  [address] [place] [address]  Provider location:   [address]  [address] [place], [place] [address]  PCP:  Kathyrn Lass, MD  Cardiologist:  [name], MD Electrophysiologist:  None   Chief Complaint:  Daytime fatigue  History of Present Illness:    Jose Stone is a 73 y.o. male who presents via audio/video conferencing for a telehealth visit today.  Jose Stone is a longstanding patient of mine with [place] including A. 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  I last saw him 3 months ago at which time his blood pressure was rising in the afternoons.  I adjusted his medications by restarting valsartan 80 mg at night in addition to 160 mg in the morning.  His blood pressure now seems to run consistently between 680 and 321 systolic during the day.  He denies any recurrent A. fib.  He has no chest pain or worsening shortness of breath.  He denies any bleeding problems with  anticoagulation.  He does report excessive daytime sleepiness.  He was referred to the sleep center for repeat sleep study which is been negative in the past.  Unfortunately due to the COVID-19 pandemic, his study was postponed.  The patient does not have symptoms concerning for COVID-19 infection (fever, chills, cough, or new SHORTNESS OF BREATH).    Prior CV studies:   The following studies were reviewed today:  Echocardiogram Lab work  PMHx:  Past Medical History:  Diagnosis Date   Atrial fibrillation ([place])    CAD (coronary artery disease)    Complication of anesthesia    Hypotensive with general anesth.   GERD (gastroesophageal reflux disease)    Hyperlipidemia    VSD   Hypertension     Past Surgical History:  Procedure Laterality Date   CARDIAC CATHETERIZATION     DISTAL INTERPHALANGEAL JOINT FUSION Left 01/19/2019   Procedure: HALLUX INTERPHANGEAL JOINT FUSION LEFT FOOT;  Surgeon: Trula Slade, DPM;  Location: [facility];  Service: Podiatry;  Laterality: Left;  LEFT BLOCK   LEFT HEART CATHETERIZATION WITH CORONARY ANGIOGRAM N/A 08/14/2014   Procedure: LEFT HEART CATHETERIZATION WITH CORONARY ANGIOGRAM;  Surgeon: Birdie Riddle, MD;  Location: [facility];  Service: Cardiovascular;  Laterality: N/A;   MASS EXCISION Left 01/19/2019   Procedure: EXCISION BENIGN LESION 2.OCM LEFT FOOT;  Surgeon: Trula Slade, DPM;  Location: [facility];  Service: Podiatry;  Laterality: Left;   [place]  FAMHx:  Family History  Problem Relation Age of Onset   Pancreatic cancer Mother    Aortic aneurysm Maternal Grandmother    Heart attack Maternal Grandfather    Diabetes Paternal Grandfather    CAD Paternal Grandfather     SOCHx:   reports that he has never smoked. He has never used smokeless tobacco. He reports that he does not drink alcohol or use drugs.  ALLERGIES:  Allergies  Allergen Reactions   Neosporin  [Neomycin-Bacitracin Zn-Polymyx] Rash   Percocet [Oxycodone-Acetaminophen] Rash    Patient can tolerate acetaminophen solely   Vicodin [Hydrocodone-Acetaminophen] Rash    Patient can tolerate acetaminophen solely    MEDS:  Current Meds  Medication Sig   acetaminophen (TYLENOL) 500 MG tablet Take 1,000 mg by mouth every 6 (six) hours as needed for mild pain or headache.   allopurinol (ZYLOPRIM) 100 MG tablet Take 100 mg by mouth 2 (two) times daily.    aspirin 81 MG tablet Take 81 mg by mouth daily.   atorvastatin (LIPITOR) 80 MG tablet TAKE 1 TABLET DAILY AT 6 PM   b complex vitamins capsule Take 1 capsule by mouth daily.   cholecalciferol (VITAMIN D) 1000 UNITS tablet Take 1,000 Units by mouth daily.    COLCRYS 0.6 MG tablet Take 0.6 mg by mouth as needed (FOR GOUT).    diltiazem (CARDIZEM CD) 120 MG 24 hr capsule Take 1 capsule (120 mg total) by mouth daily.   folic acid (FOLVITE) 580 MCG tablet Take 400 mcg by mouth 2 (two) times daily.    hydroxypropyl methylcellulose / hypromellose (ISOPTO TEARS / GONIOVISC) 2.5 % ophthalmic solution Place 1 drop into both eyes 4 (four) times daily.   loratadine (CLARITIN) 10 MG tablet Take 10 mg by mouth daily as needed for allergies.   nitroGLYCERIN (NITROSTAT) 0.4 MG SL tablet Place 1 tablet (0.4 mg total) under the tongue every 5 (five) minutes as needed for chest pain. Max 3 doses.   Omega-3 Fat Ac-Cholecalciferol (DRY EYE OMEGA BENEFITS/VIT D-3 PO) Take 2 capsules by mouth 2 (two) times daily. Patient takes 4 daily, spreads them out throughout the day    omeprazole (PRILOSEC) 40 MG capsule Take 40 mg by mouth every morning. 30-60 minutes prior to breakfast.   PARoxetine (PAXIL) 20 MG tablet Take 0.5 tablets (10 mg total) by mouth every other day.   testosterone cypionate (DEPOTESTOSTERONE CYPIONATE) 200 MG/ML injection Inject 200 mg into the muscle every 14 (fourteen) days. Next injection: 01/17/2019   valsartan (DIOVAN) 160 MG  tablet TAKE 1 TABLET BY MOUTH EVERY MORNING AND TAKE 1/2 TABLET IN THE EVENING   XARELTO 20 MG TABS tablet TAKE 1 TABLET BY MOUTH EVERY DAY WITH SUPPER     ROS: Pertinent items noted in HPI and remainder of comprehensive ROS otherwise negative.  Labs/Other Tests and Data Reviewed:    Recent Labs: 07/13/2018: ALT 24; Magnesium 2.0 01/14/2019: BUN 20; Creatinine, Ser 1.10; Hemoglobin 14.3; Platelets 220; Potassium 4.4; Sodium 138   Recent Lipid Panel Lab Results  Component Value Date/Time   CHOL 131 01/22/2015 08:12 AM   TRIG 93 01/22/2015 08:12 AM   HDL 49 01/22/2015 08:12 AM   LDLCALC 63 01/22/2015 08:12 AM    Wt Readings from Last 3 Encounters:  03/01/19 242 lb (109.8 kg)  01/19/19 245 lb 3.2 oz (111.2 kg)  01/14/19 245 lb 3.2 oz (111.2 kg)     Exam:    Vital Signs:  BP 114/70    Pulse 65  Ht 6\' 1"  (1.854 m)    Wt 242 lb (109.8 kg)    BMI 31.93 kg/m    General appearance: alert, no distress and mildly obese Lungs: No audible wheezing or breathing difficulty Abdomen: Mildly obese Extremities: extremities normal, atraumatic, no cyanosis or edema Skin: Skin color, texture, turgor normal. No rashes or lesions Neurologic: Mental status: Alert, oriented, thought content appropriate Psych: Pleasant  ASSESSMENT & PLAN:    1. A. fib with RVR-spontaneously converted, possibly related to ischemia (not on triple anticoagulation) 2. Chest pain resolved- mildly abnormal nuclear stress test, normal LVEF 60-65% on echo/normal wall motion 3. Paroxysmal atrial flutter - CHADSVASC score of 1 on Xarelto 4. Coronary artery disease status post PCI to the OM with a Xience Alpine DES (2.515 mm) - on low dose Aspirin 5. Residual moderate to severe mid LAD disease 6. Hypertension 7. Dyslipidemia 8. Small perimembranous VSD 9. Fatigue-possibly symptomatic bradycardia  10. Trivial AI 11. 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 Low testosterone  Jose Stone is doing well now with better  blood pressure control throughout the day.  He still gets some fatigue during the day.  I suspect this could be a sleep related disorder.  Although he has had sleep apnea testing in the past most recently the testing was negative however I would like to repeat his study.  In addition, he had an echocardiogram in 2018 which showed a stable dilated aortic root at 4.1 cm.  The sinus of Valsalva measured 4.4 cm and there was trivial AI.  I would like to get a CT scan of the ascending thoracic aorta to better visualize this and follow-up on the dilated aortic root.  Plan follow-up with me in 6 months or sooner as needed.  COVID-19 Education: The signs and symptoms of COVID-19 were discussed with the patient and how to seek care for testing (follow up with PCP or arrange E-visit).  The importance of social distancing was discussed today.  Patient Risk:   After full review of this patients clinical status, I feel that they are at least moderate risk at this time.  Time:   Today, I have spent 25 minutes with the patient with telehealth technology discussing A. fib, coronary artery disease, anticoagulation, and aortic root dilatation.     Medication Adjustments/Labs and Tests Ordered: Current medicines are reviewed at length with the patient today.  Concerns regarding medicines are outlined above.   Tests Ordered: Orders Placed This Encounter  Procedures   CT ANGIO CHEST AORTA W &/OR WO CONTRAST   Basic metabolic panel    Medication Changes: No orders of the defined types were placed in this encounter.   Disposition:  in 6 month(s)  [name], MD, [facility], [place] Director of the Advanced Lipid Disorders &  Cardiovascular Risk Reduction Clinic Diplomate of the American Board of Clinical Lipidology Attending Cardiologist  Direct Dial: [phone]   Fax: [phone]  Website:  [email or web address]  [name], MD  03/01/2019 9:46 PM

## 2019-03-03 ENCOUNTER — Encounter (HOSPITAL_BASED_OUTPATIENT_CLINIC_OR_DEPARTMENT_OTHER): Payer: Medicare Other

## 2019-03-10 ENCOUNTER — Ambulatory Visit (INDEPENDENT_AMBULATORY_CARE_PROVIDER_SITE_OTHER): Payer: Medicare Other | Admitting: Podiatry

## 2019-03-10 ENCOUNTER — Other Ambulatory Visit: Payer: Self-pay

## 2019-03-10 ENCOUNTER — Ambulatory Visit (INDEPENDENT_AMBULATORY_CARE_PROVIDER_SITE_OTHER): Payer: Medicare Other

## 2019-03-10 DIAGNOSIS — M2012 Hallux valgus (acquired), left foot: Secondary | ICD-10-CM

## 2019-03-10 DIAGNOSIS — I4891 Unspecified atrial fibrillation: Secondary | ICD-10-CM | POA: Diagnosis not present

## 2019-03-10 DIAGNOSIS — I2510 Atherosclerotic heart disease of native coronary artery without angina pectoris: Secondary | ICD-10-CM | POA: Diagnosis not present

## 2019-03-10 DIAGNOSIS — Z09 Encounter for follow-up examination after completed treatment for conditions other than malignant neoplasm: Secondary | ICD-10-CM

## 2019-03-10 DIAGNOSIS — I10 Essential (primary) hypertension: Secondary | ICD-10-CM | POA: Diagnosis not present

## 2019-03-10 DIAGNOSIS — E785 Hyperlipidemia, unspecified: Secondary | ICD-10-CM | POA: Diagnosis not present

## 2019-03-13 NOTE — Progress Notes (Signed)
Subjective: Jose Stone is a 73 y.o. is seen today in office s/p left foot hallux IPJ fusion with excision of soft tissue mass preformed on 01/19/2019.  He is still not taking any pain medication.  He states the swelling has improved.  He states that inside of his foot did wrap he developed a small blister but this is been healing.  Denies any drainage or pus.  No increase in swelling or redness to his foot.  He has no other concerns today.  He denies any fevers, chills, vomiting.  No calf pain, chest pain, shortness of breath.   Objective: General: No acute distress, AAOx3  DP/PT pulses palpable 2/4, CRT < 3 sec to all digits.  LEFT foot: Incision is well coapted without any evidence of dehiscence and sutures are intact.  There is a small area of epidermalysis to the medial foot where he had a small blister this appears to be healing well with any signs of infection.  No tenderness palpation at surgical site.  There is decreased edema however mild edema still present of the hallux.  No erythema or warmth.  No clinical signs of infection. No other areas of tenderness to bilateral lower extremities.  No other open lesions or pre-ulcerative lesions.  No pain with calf compression, swelling, warmth, erythema.   Assessment and Plan:  Status post left foot surgery, doing well with no complications   -Treatment options discussed including all alternatives, risks, and complications -X-ray was obtained and reviewed.  There is increasing consolidation on the left base specific.  I did show him the x-rays and he also took pictures of the x-rays to compare. -Given his neuropathy as well as x-ray findings wearing continue the cam boot for now.  Continue ice elevate. -Monitor for any clinical signs or symptoms of infection and directed to call the office immediately should any occur or go to the ER.  Return in about 2 weeks (around 03/24/2019). x-ray next appointment.   Trula Slade DPM    There is  pain of the hallux IPJ.  On the part of the arthrodesis site there is starting to be some bone formation present on x-ray although mild. -Incision appears to be doing well at this time.  Patient antibiotic ointment dressing changes daily.  Continue with compression.  Continue cam boot ice elevation. -Acute hyperkeratotic tissue on the medial hallux IPJ without any complications or bleeding. -Monitor for any clinical signs or symptoms of infection and directed to call the office immediately should any occur or go to the ER.  RTC 2 weeks or sooner if needed. X-ray next appointment  Trula Slade DPM

## 2019-03-16 IMAGING — CR DG CHEST 2V
2 series · 2 of 2 positions shown · non-contrast
Comparison: Radiographs March 21, 2016.

CLINICAL DATA: Chest pain.

EXAM:
CHEST  2 VIEW

[chest lat]
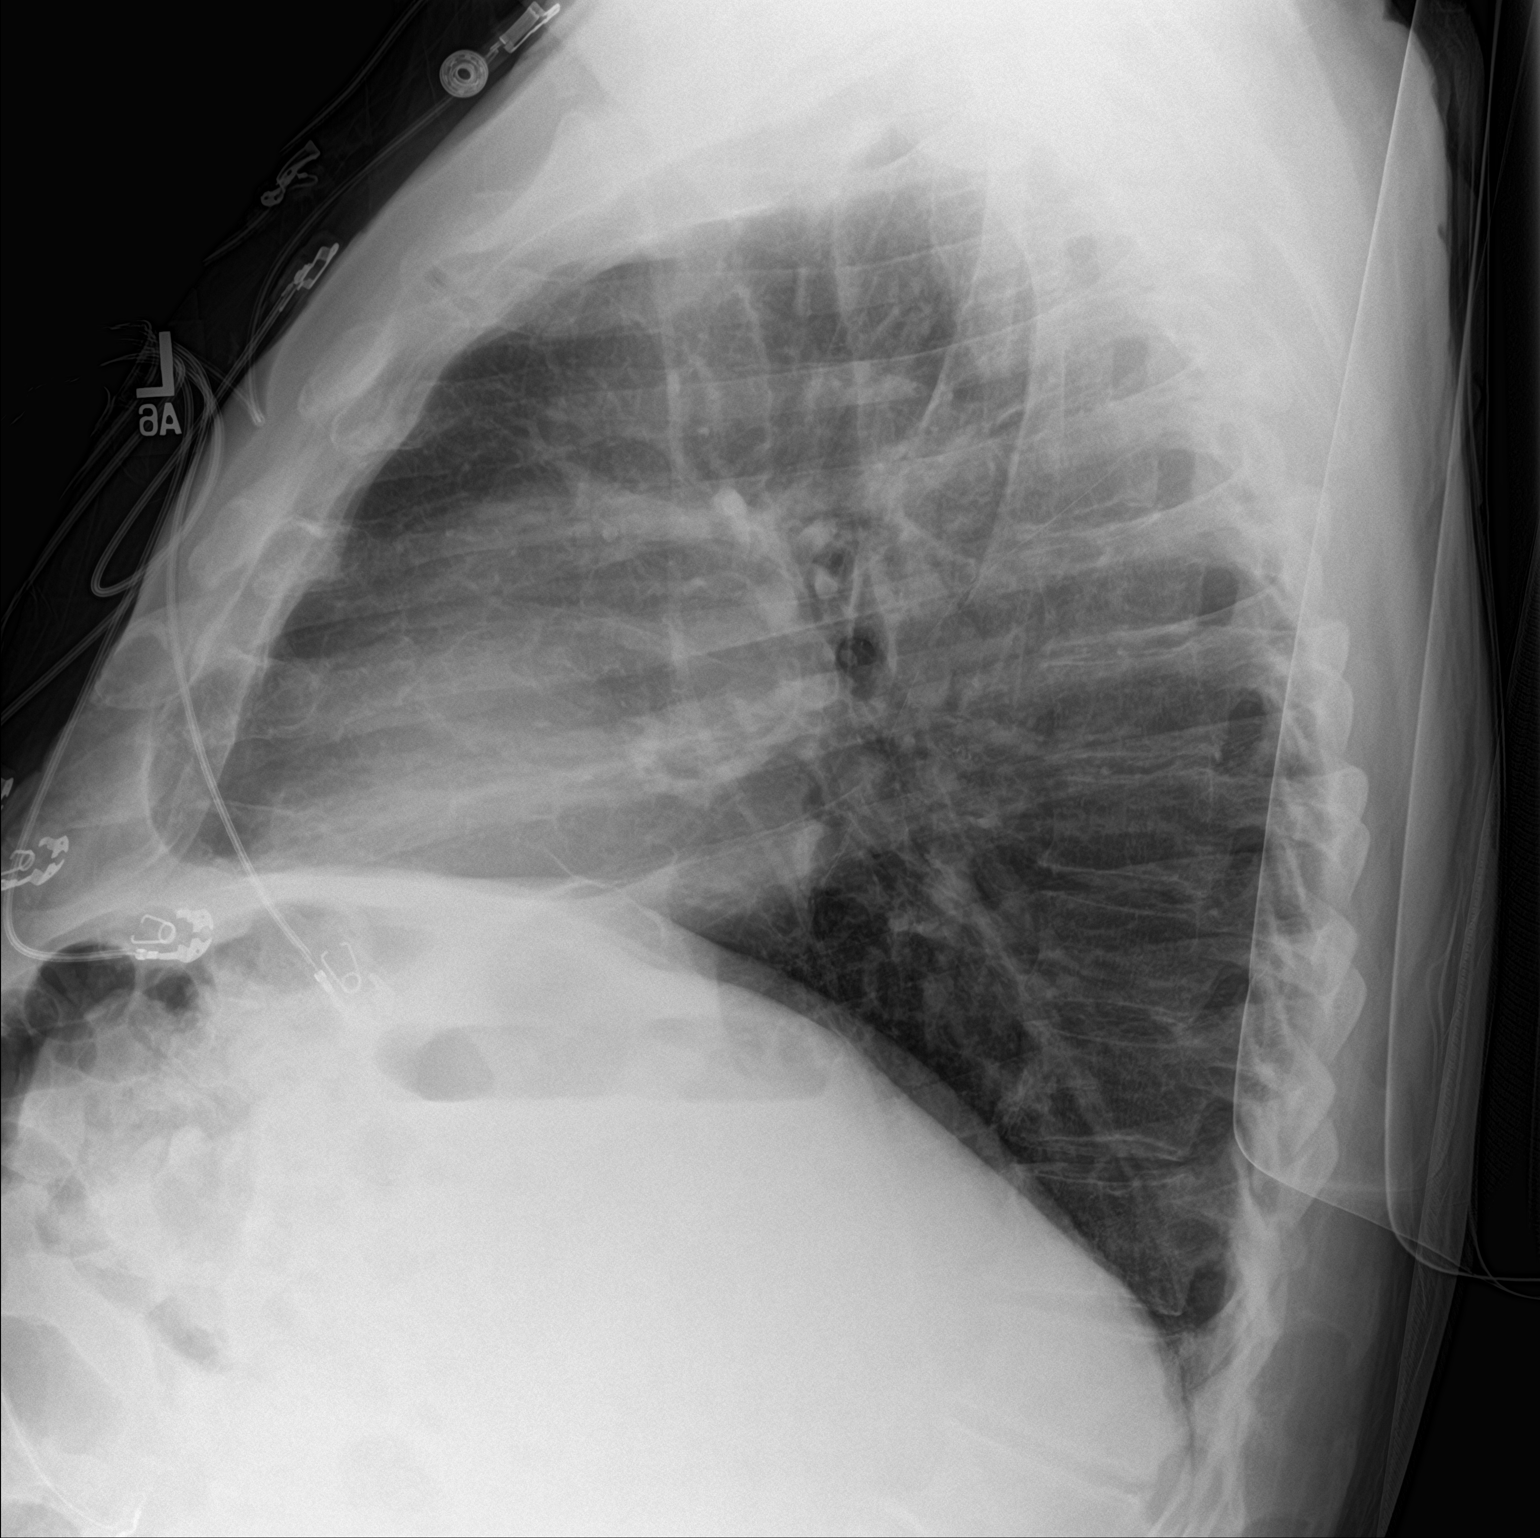

[chest ap]
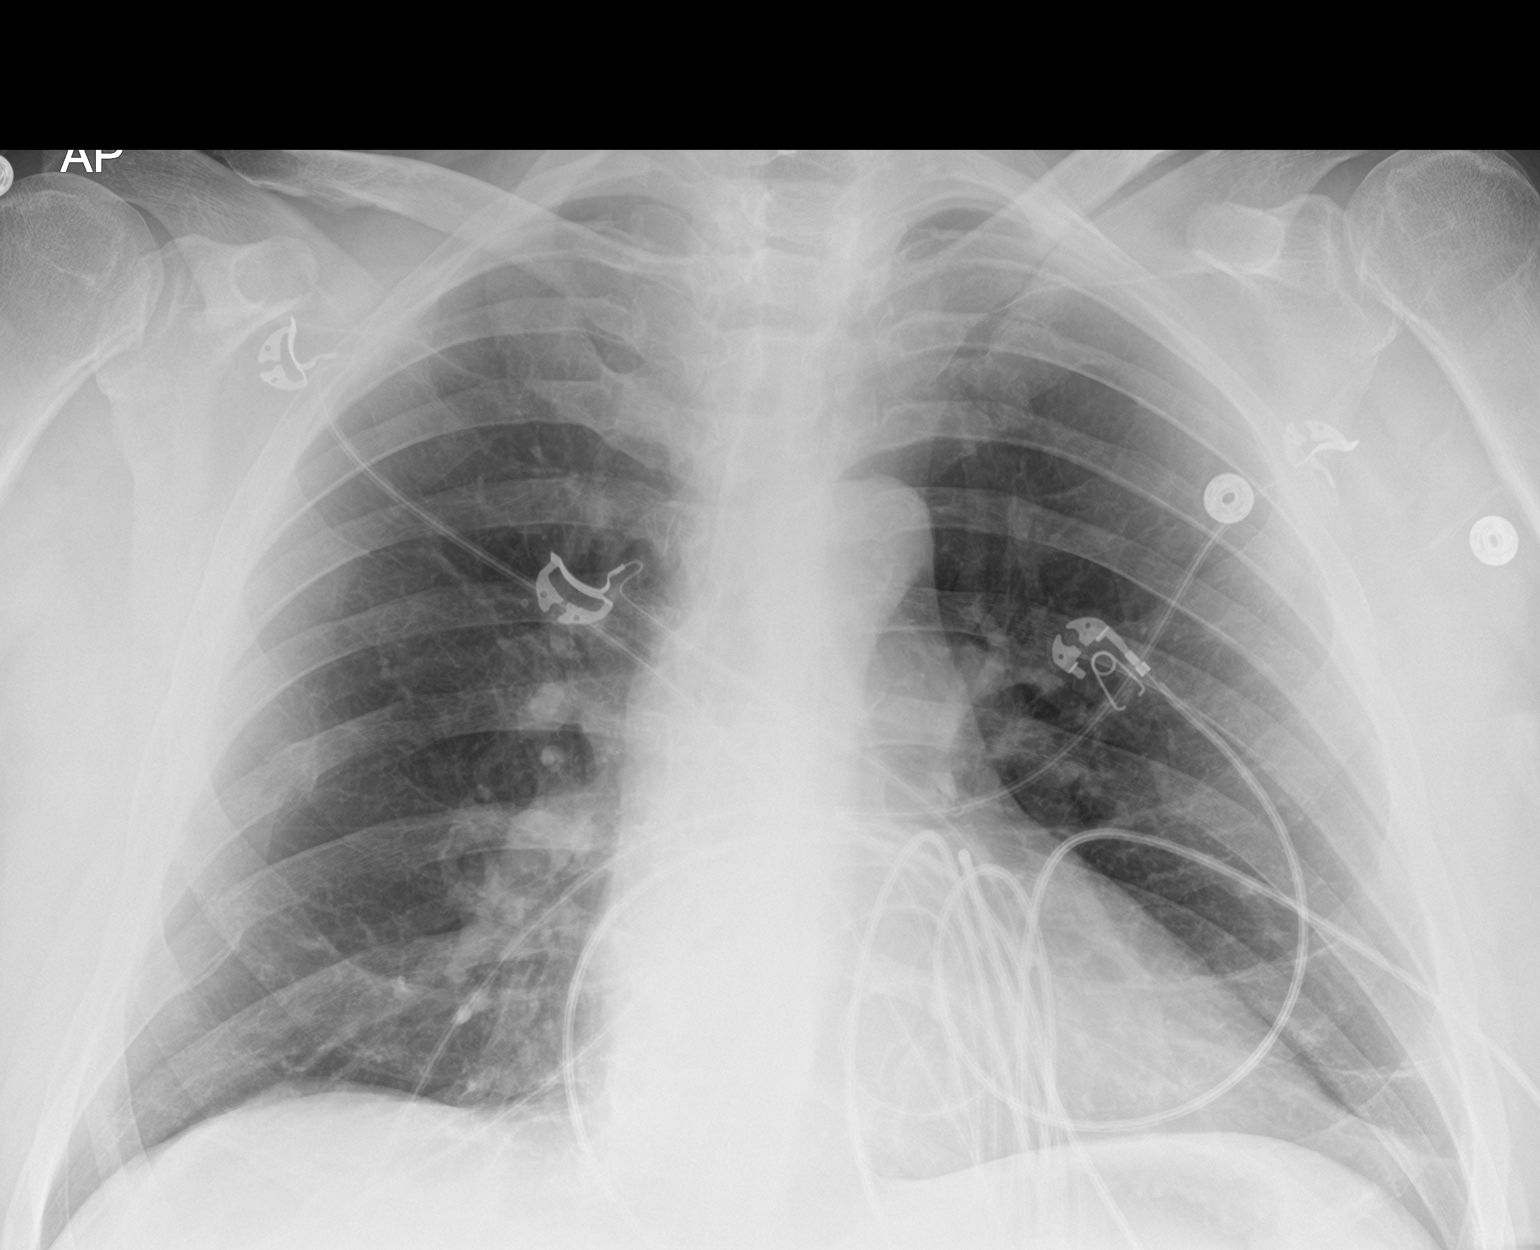

[2 of 2 positions shown; findings below may reference images not displayed]

FINDINGS: The heart size and mediastinal contours are within normal limits. No
pneumothorax or pleural effusion is noted. Stable left basilar
scarring is noted. No acute pulmonary disease is noted. Old right
rib fractures are noted.
IMPRESSION: No active cardiopulmonary disease.

## 2019-03-31 ENCOUNTER — Ambulatory Visit (INDEPENDENT_AMBULATORY_CARE_PROVIDER_SITE_OTHER): Payer: Medicare Other

## 2019-03-31 ENCOUNTER — Ambulatory Visit (INDEPENDENT_AMBULATORY_CARE_PROVIDER_SITE_OTHER): Payer: Medicare Other | Admitting: Podiatry

## 2019-03-31 ENCOUNTER — Other Ambulatory Visit: Payer: Self-pay

## 2019-03-31 ENCOUNTER — Encounter: Payer: Self-pay | Admitting: Podiatry

## 2019-03-31 VITALS — Temp 96.6°F

## 2019-03-31 DIAGNOSIS — M2012 Hallux valgus (acquired), left foot: Secondary | ICD-10-CM | POA: Diagnosis not present

## 2019-03-31 DIAGNOSIS — Z09 Encounter for follow-up examination after completed treatment for conditions other than malignant neoplasm: Secondary | ICD-10-CM

## 2019-04-03 NOTE — Progress Notes (Signed)
Subjective: Jose Stone is a 73 y.o. is seen today in office s/p left foot hallux IPJ fusion with excision of soft tissue mass preformed on 01/19/2019.  He states he continues to feel well.  Not having take any pain medication.  He is ready to back into regular shoe he did bring with him today.  He states his foot feels great.  He states the swelling is also improved. He has no other concerns today.  He denies any fevers, chills, vomiting.  No calf pain, chest pain, shortness of breath.   Objective: General: No acute distress, AAOx3  DP/PT pulses palpable 2/4, CRT < 3 sec to all digits.  LEFT foot: Incision is well coapted without any evidence of dehiscence scar is well formed.  There is decreased edema to the area there is no erythema or warmth.  There is no clinical signs of infection.  The arthrodesis site appears to be stable.  There is been no recurrence of the callus of the far.  No other areas of tenderness. No other open lesions or pre-ulcerative lesions.  No pain with calf compression, swelling, warmth, erythema.   Assessment and Plan:  Status post left foot surgery, doing well with no complications   -Treatment options discussed including all alternatives, risks, and complications -X-ray was obtained and reviewed.  This shows radiolucent line across the IPJ arthrodesis site with some mild consolidation.  He again took pictures of the x-rays he is been tracking her progress as well. -Clinically the toe appears to be stable and spent 10 weeks of immobilization.  At this time we will start her swelling transition back to regular shoe.  Discussed likely some mild increase in swelling compression sock was dispensed.  Gradual return to regular shoe before pursuing any ultrasounds.  Trula Slade DPM

## 2019-04-05 DIAGNOSIS — R55 Syncope and collapse: Secondary | ICD-10-CM | POA: Diagnosis not present

## 2019-04-05 DIAGNOSIS — Z0000 Encounter for general adult medical examination without abnormal findings: Secondary | ICD-10-CM | POA: Diagnosis not present

## 2019-04-05 DIAGNOSIS — N529 Male erectile dysfunction, unspecified: Secondary | ICD-10-CM | POA: Diagnosis not present

## 2019-04-05 DIAGNOSIS — R7303 Prediabetes: Secondary | ICD-10-CM | POA: Diagnosis not present

## 2019-04-05 DIAGNOSIS — M1A9XX0 Chronic gout, unspecified, without tophus (tophi): Secondary | ICD-10-CM | POA: Diagnosis not present

## 2019-04-05 DIAGNOSIS — K219 Gastro-esophageal reflux disease without esophagitis: Secondary | ICD-10-CM | POA: Diagnosis not present

## 2019-04-21 ENCOUNTER — Ambulatory Visit (INDEPENDENT_AMBULATORY_CARE_PROVIDER_SITE_OTHER): Payer: Medicare Other | Admitting: Podiatry

## 2019-04-21 ENCOUNTER — Encounter: Payer: Self-pay | Admitting: Podiatry

## 2019-04-21 ENCOUNTER — Other Ambulatory Visit: Payer: Self-pay

## 2019-04-21 ENCOUNTER — Ambulatory Visit (INDEPENDENT_AMBULATORY_CARE_PROVIDER_SITE_OTHER): Payer: Medicare Other

## 2019-04-21 VITALS — Temp 96.1°F

## 2019-04-21 DIAGNOSIS — M2012 Hallux valgus (acquired), left foot: Secondary | ICD-10-CM | POA: Diagnosis not present

## 2019-04-21 DIAGNOSIS — S92422K Displaced fracture of distal phalanx of left great toe, subsequent encounter for fracture with nonunion: Secondary | ICD-10-CM

## 2019-04-22 NOTE — Progress Notes (Addendum)
Subjective: Jose Stone is a 73 y.o. is seen today in office s/p left foot hallux IPJ fusion with excision of soft tissue mass preformed on 01/19/2019.  Overall he states he is doing well.  He is back to regular shoe not having any pain.  Swelling is much improved as well.  He states that over the last 2 days he has noticed a gout flare he is taking colchicine which is resolved pain is more the big toe joint area did not affect the toe itself. He denies any fevers, chills, vomiting.  No calf pain, chest pain, shortness of breath.   Objective: General: No acute distress, AAOx3  DP/PT pulses palpable 2/4, CRT < 3 sec to all digits.  LEFT foot: Incision is well coapted without any evidence of dehiscence scar is well formed.  The toes and rectus position.  There is no pain to the hallux IPJ.  There is no reoccurrence of the callus, wound.  Minimal edema of the MPJ but no erythema or warmth.  He states this is improved since taking colchicine No other open lesions or pre-ulcerative lesions.  No pain with calf compression, swelling, warmth, erythema.   Assessment and Plan:  Status post left foot surgery, nonunion IPJ  -Treatment options discussed including all alternatives, risks, and complications -X-ray was obtained and reviewed (3 views AP, lateral and oblique).  This shows radiolucent line across the IPJ arthrodesis site however there is approximately 2 mm gap consistent with a nonunion at this point.  I did show him the x-rays again today..  Discussion regards to options.  Is not having any pain no redness of the callus.  There is nonunion is asymptomatic this point.  He is wearing shoes.  Gust gradual increase to activity.  There is any issues we discussed revision however for now since he is doing well you are to hold off on this and monitor. -Gout appears to be improving with colchicine. -Given nonunion I am going to order a bone stimulator  Follow-up in 6 weeks or sooner if needed  Trula Slade DPM

## 2019-04-26 ENCOUNTER — Other Ambulatory Visit (HOSPITAL_COMMUNITY)
Admission: RE | Admit: 2019-04-26 | Discharge: 2019-04-26 | Disposition: A | Discharge status: Home / Self Care | Payer: Medicare Other | Source: Ambulatory Visit | Attending: Cardiovascular Disease | Admitting: Cardiovascular Disease

## 2019-04-26 DIAGNOSIS — Z1159 Encounter for screening for other viral diseases: Secondary | ICD-10-CM | POA: Insufficient documentation

## 2019-04-26 LAB — SARS CORONAVIRUS 2 (TAT 6-24 HRS): SARS Coronavirus 2: NEGATIVE

## 2019-04-28 ENCOUNTER — Telehealth: Payer: Self-pay | Admitting: Podiatry

## 2019-04-28 NOTE — Telephone Encounter (Signed)
Pt wanted to follow up from a phone call that he received from Dr. Jacqualyn Posey. Patient had some questions.

## 2019-04-29 ENCOUNTER — Ambulatory Visit (HOSPITAL_BASED_OUTPATIENT_CLINIC_OR_DEPARTMENT_OTHER): Payer: Medicare Other | Attending: Internal Medicine | Admitting: Cardiovascular Disease

## 2019-04-29 ENCOUNTER — Other Ambulatory Visit: Payer: Self-pay

## 2019-04-29 DIAGNOSIS — I10 Essential (primary) hypertension: Secondary | ICD-10-CM | POA: Diagnosis not present

## 2019-04-29 DIAGNOSIS — G4733 Obstructive sleep apnea (adult) (pediatric): Secondary | ICD-10-CM | POA: Diagnosis not present

## 2019-04-29 DIAGNOSIS — I493 Ventricular premature depolarization: Secondary | ICD-10-CM | POA: Insufficient documentation

## 2019-04-29 DIAGNOSIS — G4719 Other hypersomnia: Secondary | ICD-10-CM | POA: Diagnosis not present

## 2019-04-29 DIAGNOSIS — Z79899 Other long term (current) drug therapy: Secondary | ICD-10-CM | POA: Diagnosis not present

## 2019-04-29 DIAGNOSIS — I480 Paroxysmal atrial fibrillation: Secondary | ICD-10-CM | POA: Diagnosis not present

## 2019-04-29 DIAGNOSIS — Z7982 Long term (current) use of aspirin: Secondary | ICD-10-CM | POA: Diagnosis not present

## 2019-04-29 DIAGNOSIS — Z7901 Long term (current) use of anticoagulants: Secondary | ICD-10-CM | POA: Diagnosis not present

## 2019-04-29 NOTE — Telephone Encounter (Signed)
Dr Jacqualyn Posey called the patient back yesterday. [name]

## 2019-05-02 ENCOUNTER — Other Ambulatory Visit: Payer: Self-pay

## 2019-05-02 ENCOUNTER — Telehealth: Payer: Self-pay | Admitting: Podiatry

## 2019-05-02 NOTE — Telephone Encounter (Signed)
Opened in error

## 2019-05-02 NOTE — Telephone Encounter (Signed)
Patient called last week and we discussed some questions he had. He wanted to know what kind of activities he can do. We discussed wearing a shoe with the graphite insert but only do gradual return to walking. He was asking about doing more high impact. I want him to get back to normal walking/exercise walking prior to this. I am also going to order a bone stimulator to see if we can try to get more bone formation. He is doing well and not having any pain, swelling, redness. Discussed revision surgery but currently doing well. Discussed that if there are any issues then we will need to revise the surgery. He had no further questions or concerns.   Trula Slade

## 2019-05-03 ENCOUNTER — Other Ambulatory Visit: Payer: Self-pay | Admitting: Internal Medicine

## 2019-05-03 ENCOUNTER — Telehealth: Payer: Self-pay

## 2019-05-03 NOTE — Telephone Encounter (Signed)
Please call. Thanks.

## 2019-05-03 NOTE — Telephone Encounter (Signed)
Called and spoke with the patient and relayed the message per Dr Jacqualyn Posey and patient states that he will come by and get the graphite insert. [name]

## 2019-05-03 NOTE — Telephone Encounter (Signed)
It is 20 minutes once a day. He places the transducer directly on the top of the toe over the surgery site. I want him to do it daily. I will see him back in a month to see how he is doing but may go longer that that.   Also, I have the other graphite insert if he wants to come by and pick it up.   Thanks.

## 2019-05-07 ENCOUNTER — Encounter (HOSPITAL_BASED_OUTPATIENT_CLINIC_OR_DEPARTMENT_OTHER): Payer: Self-pay | Admitting: Cardiovascular Disease

## 2019-05-07 NOTE — Procedures (Signed)
Patient Name: Jose Stone, Jose Stone Date: 04/29/2019 Gender: Male D.O.B: Nov 30, 1945 Age (years): 37 Referring Provider: Nadean Corwin Hilty Height (inches): 73 Interpreting Physician: Shelva Majestic MD, ABSM Weight (lbs): 232 RPSGT: Lanae Boast BMI: 31 MRN: [id number] Neck Size: 17.00  CLINICAL INFORMATION Sleep Study Type: Split Night CPAP  Indication for sleep study: Excessive Daytime Sleepiness, Hypertension  Epworth Sleepiness Score: 6  SLEEP STUDY TECHNIQUE As per the AASM Manual for the Scoring of Sleep and Associated Events v2.3 (April 2016) with a hypopnea requiring 4% desaturations.  The channels recorded and monitored were frontal, central and occipital EEG, electrooculogram (EOG), submentalis EMG (chin), nasal and oral airflow, thoracic and abdominal wall motion, anterior tibialis EMG, snore microphone, electrocardiogram, and pulse oximetry. Continuous positive airway pressure (CPAP) was initiated when the patient met split night criteria and was titrated according to treat sleep-disordered breathing.  MEDICATIONS     acetaminophen (TYLENOL) 500 MG tablet             allopurinol (ZYLOPRIM) 100 MG tablet         aspirin 81 MG tablet         atorvastatin (LIPITOR) 80 MG tablet         b complex vitamins capsule         cholecalciferol (VITAMIN D) 1000 UNITS tablet         COLCRYS 0.6 MG tablet         diltiazem (CARDIZEM CD) 120 MG 24 hr capsule         folic acid (FOLVITE) 832 MCG tablet         hydroxypropyl methylcellulose / hypromellose (ISOPTO TEARS / GONIOVISC) 2.5 % ophthalmic solution         loratadine (CLARITIN) 10 MG tablet         nitroGLYCERIN (NITROSTAT) 0.4 MG SL tablet         Omega-3 Fat Ac-Cholecalciferol (DRY EYE OMEGA BENEFITS/VIT D-3 PO)         omeprazole (PRILOSEC) 40 MG capsule         PARoxetine (PAXIL) 20 MG tablet         testosterone cypionate (DEPOTESTOSTERONE CYPIONATE) 200 MG/ML injection         valsartan (DIOVAN) 160  MG tablet         XARELTO 20 MG TABS tablet      Medications self-administered by patient taken the night of the study : ASPIRIN, RANITIDINE  RESPIRATORY PARAMETERS Diagnostic Total AHI (/hr): 7.7 RDI (/hr): 14.5 OA Index (/hr): 1.9 CA Index (/hr): 0.5 REM AHI (/hr): 0.0 NREM AHI (/hr): 8.5 Supine AHI (/hr): 0.0 Non-supine AHI (/hr): 7.8 Min O2 Sat (%): 90.0 Mean O2 (%): 93.8 Time below 88% (min): 0   Titration Optimal Pressure (cm):  AHI at Optimal Pressure (/hr): 12 Min O2 at Optimal Pressure (%): 89.0 Supine % at Optimal (%): N/A Sleep % at Optimal (%): N/A   SLEEP ARCHITECTURE The recording time for the entire night was 459 minutes.  During a baseline period of 235.7 minutes, the patient slept for 124.5 minutes in REM and nonREM, yielding a sleep efficiency of 52.8%%. Sleep onset after lights out was 60.4 minutes with a REM latency of 147.0 minutes. The patient spent 39.0%% of the night in stage N1 sleep, 51.8%% in stage N2 sleep, 0.0%% in stage N3 and 9.2% in REM.  During the titration period of 220.2 minutes, the patient slept for 135.5 minutes in REM and nonREM, yielding a  sleep efficiency of 61.5%%. Sleep onset after CPAP initiation was 33.6 minutes with a REM latency of 118.0 minutes. The patient spent 37.3%% of the night in stage N1 sleep, 43.2%% in stage N2 sleep, 0.0%% in stage N3 and 19.6% in REM.  CARDIAC DATA The 2 lead EKG demonstrated sinus rhythm. The mean heart rate was 100.0 beats per minute. Other EKG findings include: PVCs.  LEG MOVEMENT DATA The total Periodic Limb Movements of Sleep (PLMS) were 0. The PLMS index was 0.0 .  IMPRESSIONS - Mild obstructive sleep apnea occurred during the diagnostic portion of the study (AHI 7.7 /h; RDI 14.5/h). CPAP was initiated at 5 cm and was titrated to optimal pressure at 12 cm of water. AHI at 12 cm was 0/h. - No significant central sleep apnea occurred during the diagnostic portion of the study (CAI 0.5/hour). - Oxygen  desaturation nadir during the diagnostic portion of the study 90.0%. - The patient snored with loud snoring volume during the diagnostic portion of the study. - EKG findings include PVCs. - Clinically significant periodic limb movements did not occur during sleep.  DIAGNOSIS - Obstructive Sleep Apnea (327.23 [G47.33 ICD-10])  RECOMMENDATIONS - Recommend an initial trial of CPAP with EPR at 12 cm with heated humifdification. A Fisher Pykal simplus medium FFM was used for the titration. - Effort should be made to optimize nasal and oropharyngeal patency. - Avoid alcohol, sedatives and other CNS depressants that may worsen sleep apnea and disrupt normal sleep architecture. - Sleep hygiene should be reviewed to assess factors that may improve sleep quality. - Weight management and regular exercise should be initiated or continued. - Recommend a download in 30 days and sleep clinicevaluation after 4 weeks of therapy.  [Electronically signed] 05/07/2019 03:51 PM  Shelva Majestic MD, [facility], ABSM Diplomate, American Board of Sleep Medicine   NPI: [id number] [place] PH: [phone]   FX: [phone] [place]

## 2019-05-13 ENCOUNTER — Other Ambulatory Visit: Payer: Medicare Other | Admitting: *Deleted

## 2019-05-13 ENCOUNTER — Other Ambulatory Visit: Payer: Self-pay

## 2019-05-13 DIAGNOSIS — Z01812 Encounter for preprocedural laboratory examination: Secondary | ICD-10-CM | POA: Diagnosis not present

## 2019-05-13 LAB — BASIC METABOLIC PANEL
BUN/Creatinine Ratio: 15 (ref 10–24)
BUN: 18 mg/dL (ref 8–27)
CO2: 23 mmol/L (ref 20–29)
Calcium: 9.2 mg/dL (ref 8.6–10.2)
Chloride: 105 mmol/L (ref 96–106)
Creatinine, Ser: 1.18 mg/dL (ref 0.76–1.27)
GFR calc Af Amer: 71 mL/min/{1.73_m2} (ref >59)
GFR calc non Af Amer: 62 mL/min/{1.73_m2} (ref >59)
Glucose: 92 mg/dL (ref 65–99)
Potassium: 4.6 mmol/L (ref 3.5–5.2)
Sodium: 142 mmol/L (ref 134–144)

## 2019-05-19 ENCOUNTER — Telehealth: Payer: Self-pay | Admitting: *Deleted

## 2019-05-19 DIAGNOSIS — N400 Enlarged prostate without lower urinary tract symptoms: Secondary | ICD-10-CM | POA: Diagnosis not present

## 2019-05-19 DIAGNOSIS — E291 Testicular hypofunction: Secondary | ICD-10-CM | POA: Diagnosis not present

## 2019-05-19 NOTE — Telephone Encounter (Signed)
-----   Message from Troy Sine, MD sent at 05/07/2019  3:59 PM EDT ----- Mariann Laster, please notify pt and set up with DME for CPAP initiation

## 2019-05-19 NOTE — Telephone Encounter (Signed)
Faxed CPAP orders to [facility].

## 2019-05-23 DIAGNOSIS — N5201 Erectile dysfunction due to arterial insufficiency: Secondary | ICD-10-CM | POA: Diagnosis not present

## 2019-05-23 DIAGNOSIS — E291 Testicular hypofunction: Secondary | ICD-10-CM | POA: Diagnosis not present

## 2019-05-31 ENCOUNTER — Ambulatory Visit (INDEPENDENT_AMBULATORY_CARE_PROVIDER_SITE_OTHER)
Admission: RE | Admit: 2019-05-31 | Discharge: 2019-05-31 | Disposition: A | Discharge status: Home / Self Care | Payer: Medicare Other | Source: Ambulatory Visit | Attending: Internal Medicine | Admitting: Internal Medicine

## 2019-05-31 ENCOUNTER — Other Ambulatory Visit: Payer: Self-pay

## 2019-05-31 DIAGNOSIS — I712 Thoracic aortic aneurysm, without rupture, unspecified: Secondary | ICD-10-CM

## 2019-05-31 MED ORDER — IOHEXOL 350 MG/ML SOLN
100.0000 mL | Freq: Once | INTRAVENOUS | Status: AC | PRN
  Administered 2019-05-31: 100 mL via INTRAVENOUS

## 2019-06-02 ENCOUNTER — Ambulatory Visit (INDEPENDENT_AMBULATORY_CARE_PROVIDER_SITE_OTHER): Payer: Medicare Other

## 2019-06-02 ENCOUNTER — Ambulatory Visit (INDEPENDENT_AMBULATORY_CARE_PROVIDER_SITE_OTHER): Payer: Medicare Other | Admitting: Podiatry

## 2019-06-02 ENCOUNTER — Other Ambulatory Visit: Payer: Self-pay

## 2019-06-02 ENCOUNTER — Encounter: Payer: Self-pay | Admitting: Podiatry

## 2019-06-02 DIAGNOSIS — M2012 Hallux valgus (acquired), left foot: Secondary | ICD-10-CM

## 2019-06-02 DIAGNOSIS — E559 Vitamin D deficiency, unspecified: Secondary | ICD-10-CM

## 2019-06-02 NOTE — Progress Notes (Signed)
Subjective: Jose Stone is a 73 y.o. is seen today in office s/p left foot hallux IPJ fusion with excision of soft tissue mass preformed on 01/19/2019.  He presents today for Evaluation of the nonunion at the hallux IPJ fusion.  He has no pain no swelling.  Is been using a density noted.  He does do his normal walking has been using a graphite insert.  He has been using the fungi-nail on the right hallux toenail.  Nail fungus and he thinks the nail may come off. He denies any fevers, chills, vomiting.  No calf pain, chest pain, shortness of breath.   Objective: General: No acute distress, AAOx3  DP/PT pulses palpable 2/4, CRT < 3 sec to all digits.  LEFT foot: Incision is well coapted without any evidence of dehiscence scar is well formed.  The toes and rectus position.  There is no pain to the hallux IPJ.  There is no reoccurrence of the callus, wound.  Very minimal edema of the MPJ but no erythema or warmth.  Mild incurvation present to the left medial hallux nail border as well as the right hallux.  There is some dried blood present in the nail borders on both the left and right big toenails.  No drainage or pus or any surrounding cellulitis.  Nails are hypertrophic, dystrophic. No other open lesions or pre-ulcerative lesions.  No pain with calf compression, swelling, warmth, erythema.   Assessment and Plan:  Status post left foot surgery, nonunion IPJ; onychomycosis  -Treatment options discussed including all alternatives, risks, and complications -X-ray was obtained and reviewed (3 views AP, lateral and oblique).  There is still radiolucency present along the arthrodesis site. -This time warning signs of instability.  Continue graphite inserts.  He can do normal walking but on a hold off on any other strenuous activity. -I debrided the left hallux toenail with any complications or bleeding.  Discussed Epson salt soaks daily.  Monitoring signs or symptoms of infection.  Return in about 8  weeks (around 07/28/2019).  Trula Slade DPM

## 2019-06-03 ENCOUNTER — Telehealth: Payer: Self-pay | Admitting: *Deleted

## 2019-06-03 DIAGNOSIS — E559 Vitamin D deficiency, unspecified: Secondary | ICD-10-CM | POA: Diagnosis not present

## 2019-06-03 NOTE — Telephone Encounter (Signed)
-----   Message from Trula Slade, DPM sent at 06/02/2019  7:38 PM EDT ----- He left and didn't get his lab papers to check his vitamin d level. Order is in the computer can you please have him get this done when able? Thaank.

## 2019-06-03 NOTE — Telephone Encounter (Signed)
Called and left a message for the patient to call me at [phone]. [name]

## 2019-06-03 NOTE — Telephone Encounter (Signed)
MD will call patient.

## 2019-06-03 NOTE — Telephone Encounter (Signed)
I informed pt of Dr. Leigh Aurora orders and he states he will have performed.

## 2019-06-04 LAB — VITAMIN D 25 HYDROXY (VIT D DEFICIENCY, FRACTURES): Vit D, 25-Hydroxy: 65 ng/mL (ref 30–100)

## 2019-06-07 ENCOUNTER — Telehealth: Payer: Self-pay | Admitting: *Deleted

## 2019-06-07 NOTE — Progress Notes (Signed)
Called patient yesterday and stated that per Dr Jacqualyn Posey the vitamin D level was great. [name]

## 2019-06-07 NOTE — Telephone Encounter (Signed)
Patient notified CPAP orders were sent over to [facility] on 05/19/19. Apparently they were not received. Apologized to patient and assured him they will be resent today and receipt confirmed. Patient voiced understanding and thanked me for calling.

## 2019-06-20 DIAGNOSIS — H02831 Dermatochalasis of right upper eyelid: Secondary | ICD-10-CM | POA: Diagnosis not present

## 2019-06-20 DIAGNOSIS — H57813 Brow ptosis, bilateral: Secondary | ICD-10-CM | POA: Diagnosis not present

## 2019-06-20 DIAGNOSIS — H02423 Myogenic ptosis of bilateral eyelids: Secondary | ICD-10-CM | POA: Diagnosis not present

## 2019-06-20 DIAGNOSIS — H02834 Dermatochalasis of left upper eyelid: Secondary | ICD-10-CM | POA: Diagnosis not present

## 2019-06-27 ENCOUNTER — Telehealth: Payer: Self-pay

## 2019-06-27 NOTE — Telephone Encounter (Signed)
Pt takes Xarelto for afib with CHADS2VASc score of 3 (age, HTN, CAD). Renal function is normal - CrC 33mL/min. Ok to hold Xarelto for 2 days prior to procedure.

## 2019-06-27 NOTE — Telephone Encounter (Signed)
   Primary Cardiologist: [name], MD  Chart reviewed as part of pre-operative protocol coverage. Patient was contacted 06/27/2019 in reference to pre-operative risk assessment for pending surgery as outlined below.  SORREN PACIS was last seen on 03/01/19 by Dr. [name].  Since that day, Jose Stone has done well. He has a history of CAD with stent to his OM and known LAD disease treated medically. He had an abnormal stress test in 2018, which was discussed with Dr. [name] and treated medically. He recently started using CPAP. He has been limited by recent toe surgery. His gym is closed due to COVID-19. However, he can still complete greater than 4.0 METS without anginal symptoms. According to the RCRI, perioperative risk of major cardiac complication is [id number].  Per our clinical pharmacist, may hold xarelto for 2 day prior to procedure and start as soon after when safe. We generally recommend continuing ASA throughout the perioperative period. However, if necessary, he may hold ASA 5-7 days prior to surgery.  Therefore, based on ACC/AHA guidelines, the patient would be at acceptable risk for the planned procedure without further cardiovascular testing.   I will route this recommendation to the requesting party via Epic fax function and remove from pre-op pool.  Please call with questions.  [place], PA 06/27/2019, 12:02 PM

## 2019-06-27 NOTE — Telephone Encounter (Signed)
Please comment on xarelto. 

## 2019-06-27 NOTE — Telephone Encounter (Signed)
   [facility] Pre-operative Risk Assessment    Request for surgical clearance:  1. What type of surgery is being performed? BILATERAL BROW LIFT W/BILATERAL UPPER LID PTOSIS REPAIR   2. When is this surgery scheduled?  07-26-2019   3. What type of clearance is required (medical clearance vs. Pharmacy clearance to hold med vs. Both)? BOTH  4. Are there any medications that need to be held prior to surgery and how long? XARELTO AND ASA   5. Practice name and name of physician performing surgery?   [facility]  Ramonita Lab, MD PhD  6. What is your office phone number [phone]    7.   What is your office fax number  [phone]  8.   Anesthesia type (None, local, MAC, general) ? GENERAL   Waylan Rocher 06/27/2019, 9:37 AM  _________________________________________________________________   (provider comments below)

## 2019-06-27 NOTE — Telephone Encounter (Signed)
Pt notified to hold xarelto for 2 day prior to procedure (07-24-2019) and ASA 5-7 days prior (9-15 thru 9-17) he will start back ASAP after surgery

## 2019-07-01 ENCOUNTER — Telehealth: Payer: Self-pay | Admitting: Internal Medicine

## 2019-07-01 NOTE — Telephone Encounter (Signed)
Error. No note needed  

## 2019-07-08 ENCOUNTER — Telehealth: Payer: Self-pay

## 2019-07-08 ENCOUNTER — Other Ambulatory Visit: Payer: Self-pay | Admitting: Internal Medicine

## 2019-07-08 NOTE — Telephone Encounter (Signed)
Pt called regarding a med refill and stated that he would like for you to give him a call concerning his sleep study.

## 2019-07-26 DIAGNOSIS — H57813 Brow ptosis, bilateral: Secondary | ICD-10-CM | POA: Diagnosis not present

## 2019-07-26 DIAGNOSIS — H02423 Myogenic ptosis of bilateral eyelids: Secondary | ICD-10-CM | POA: Diagnosis not present

## 2019-07-28 ENCOUNTER — Ambulatory Visit: Payer: Medicare Other | Admitting: Podiatry

## 2019-08-09 ENCOUNTER — Ambulatory Visit (INDEPENDENT_AMBULATORY_CARE_PROVIDER_SITE_OTHER): Payer: Medicare Other

## 2019-08-09 ENCOUNTER — Other Ambulatory Visit: Payer: Self-pay

## 2019-08-09 ENCOUNTER — Ambulatory Visit (INDEPENDENT_AMBULATORY_CARE_PROVIDER_SITE_OTHER): Payer: Medicare Other | Admitting: Podiatry

## 2019-08-09 DIAGNOSIS — M2012 Hallux valgus (acquired), left foot: Secondary | ICD-10-CM | POA: Diagnosis not present

## 2019-08-10 NOTE — Progress Notes (Signed)
Subjective: Jose Stone is a 73 y.o. is seen today in office s/p left foot hallux IPJ fusion with excision of soft tissue mass preformed on 01/19/2019.  Overall he states he is doing well.  Is back to doing his normal activities on a regular shoes without any problems.  Still using a bone stimulator. He denies any fevers, chills, vomiting.  No calf pain, chest pain, shortness of breath.   Objective: General: No acute distress, AAOx3  DP/PT pulses palpable 2/4, CRT < 3 sec to all digits.  LEFT foot: Incision is well coapted without any evidence of dehiscence scar is well formed.  There is no recurrence of the callus, blister, ulceration that he is having prior to surgery.  No pain.  No swelling or redness.  The soft tissue mass has not reoccurred.  All incisions are well-healed without any swelling or any signs of infection. No other open lesions or pre-ulcerative lesions.  No pain with calf compression, swelling, warmth, erythema.   Assessment and Plan:  Status post left foot surgery, nonunion IPJ  -Treatment options discussed including all alternatives, risks, and complications -X-ray was obtained and reviewed (3 views AP, lateral and oblique).  There is still radiolucency present along the arthrodesis site consistent with a nonunion.  At this time he is asymptomatic.  He is wearing a regular shoe.  I would continue the graphite insert.  He is doing his daily activities and he states he is doing what he wants to do without any issues.  At this point we will discharge him in the postoperative care but if it causes any issues discussed revision of the IPJ fusion.  Return if symptoms worsen or fail to improve.  Trula Slade DPM

## 2019-08-12 ENCOUNTER — Other Ambulatory Visit: Payer: Self-pay | Admitting: Internal Medicine

## 2019-08-16 ENCOUNTER — Other Ambulatory Visit: Payer: Self-pay | Admitting: Internal Medicine

## 2019-08-23 DIAGNOSIS — H2513 Age-related nuclear cataract, bilateral: Secondary | ICD-10-CM | POA: Diagnosis not present

## 2019-08-23 DIAGNOSIS — H5203 Hypermetropia, bilateral: Secondary | ICD-10-CM | POA: Diagnosis not present

## 2019-08-25 ENCOUNTER — Encounter: Payer: Self-pay | Admitting: Internal Medicine

## 2019-08-25 ENCOUNTER — Telehealth (INDEPENDENT_AMBULATORY_CARE_PROVIDER_SITE_OTHER): Payer: Medicare Other | Admitting: Internal Medicine

## 2019-08-25 VITALS — BP 110/80 | HR 66 | Ht 73.0 in | Wt 245.0 lb

## 2019-08-25 DIAGNOSIS — Z9989 Dependence on other enabling machines and devices: Secondary | ICD-10-CM | POA: Diagnosis not present

## 2019-08-25 DIAGNOSIS — Q210 Ventricular septal defect: Secondary | ICD-10-CM

## 2019-08-25 DIAGNOSIS — Z23 Encounter for immunization: Secondary | ICD-10-CM | POA: Diagnosis not present

## 2019-08-25 DIAGNOSIS — I480 Paroxysmal atrial fibrillation: Secondary | ICD-10-CM

## 2019-08-25 DIAGNOSIS — I351 Nonrheumatic aortic (valve) insufficiency: Secondary | ICD-10-CM

## 2019-08-25 DIAGNOSIS — E785 Hyperlipidemia, unspecified: Secondary | ICD-10-CM | POA: Diagnosis not present

## 2019-08-25 DIAGNOSIS — Z7901 Long term (current) use of anticoagulants: Secondary | ICD-10-CM

## 2019-08-25 DIAGNOSIS — G4733 Obstructive sleep apnea (adult) (pediatric): Secondary | ICD-10-CM | POA: Diagnosis not present

## 2019-08-25 DIAGNOSIS — I77810 Thoracic aortic ectasia: Secondary | ICD-10-CM | POA: Diagnosis not present

## 2019-08-25 NOTE — Progress Notes (Signed)
Virtual Visit via Video Note   This visit type was conducted due to national recommendations for restrictions regarding the COVID-19 Pandemic (e.g. social distancing) in an effort to limit this patient's exposure and mitigate transmission in our community.  Due to his co-morbid illnesses, this patient is at least at moderate risk for complications without adequate follow up.  This format is felt to be most appropriate for this patient at this time.  All issues noted in this document were discussed and addressed.  A limited physical exam was performed with this format.  Please refer to the patient's chart for his consent to telehealth for [facility].   Evaluation Performed:  Doxy[employer]me video visit  Date:  08/25/2019   ID:  Jose Stone, DOB 1945/11/16, MRN [id number]  Patient Location:  [address] [place] [address]  Provider location:   [address], [address] [place], [place] [address]  PCP:  Kathyrn Lass, MD  Cardiologist:  [name], MD Electrophysiologist:  None   Chief Complaint:  Follow-up  History of Present Illness:    Jose Stone is a 73 y.o. male who presents via audio/video conferencing for a telehealth visit today.  Jose Stone returns today for follow-up.  Overall he is feeling well.  He denies any chest pain or worsening shortness of breath.  When I last saw him by video visit in the spring we ordered a repeat CT scan for a dilated aorta.  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  There is no clear aneurysm.  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  Despite this, he denies any shortness of breath or associated symptoms.  We will likely want to repeat his echo next year again to look for changes possibly related to the VSD and/or monitoring and evaluation of pulmonary hypertension.  He recently had  received new CPAP equipment and has been using it.  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  He also had a brow lift/blepharoplasty for issues with difficulty seeing.  The patient does not have symptoms concerning for COVID-19 infection (fever, chills, cough, or new SHORTNESS OF BREATH).    Prior CV studies:   The following studies were reviewed today:  Chart reviewed Lab work  PMHx:  Past Medical History:  Diagnosis Date  . Atrial fibrillation ([place])   . CAD (coronary artery disease)   . Complication of anesthesia    Hypotensive with general anesth.  . GERD (gastroesophageal reflux disease)   . Hyperlipidemia    VSD  . Hypertension     Past Surgical History:  Procedure Laterality Date  . CARDIAC CATHETERIZATION    . DISTAL INTERPHALANGEAL JOINT FUSION Left 01/19/2019   Procedure: HALLUX INTERPHANGEAL JOINT FUSION LEFT FOOT;  Surgeon: Trula Slade, DPM;  Location: [facility];  Service: Podiatry;  Laterality: Left;  LEFT BLOCK  . LEFT HEART CATHETERIZATION WITH CORONARY ANGIOGRAM N/A 08/14/2014   Procedure: LEFT HEART CATHETERIZATION WITH CORONARY ANGIOGRAM;  Surgeon: Birdie Riddle, MD;  Location: [facility] CATH [facility];  Service: Cardiovascular;  Laterality: N/A;  . MASS EXCISION Left 01/19/2019   Procedure: EXCISION BENIGN LESION 2.OCM LEFT FOOT;  Surgeon: Trula Slade, DPM;  Location: [facility];  Service: Podiatry;  Laterality: Left;  . SHOULDER SURGERY    .  TONSILLECTOMY  1955    FAMHx:  Family History  Problem Relation Age of Onset  . Pancreatic cancer Mother   . Aortic aneurysm Maternal Grandmother   . Heart attack Maternal Grandfather   . Diabetes Paternal Grandfather   . CAD Paternal Grandfather     SOCHx:   reports that he has never smoked. He has never used smokeless tobacco. He reports that he does not drink alcohol or use drugs.  ALLERGIES:  Allergies  Allergen Reactions  . Neosporin [Neomycin-Bacitracin Zn-Polymyx] Rash  .  Percocet [Oxycodone-Acetaminophen] Rash    Patient can tolerate acetaminophen solely  . Vicodin [Hydrocodone-Acetaminophen] Rash    Patient can tolerate acetaminophen solely    MEDS:  Current Meds  Medication Sig  . acetaminophen (TYLENOL) 500 MG tablet Take 1,000 mg by mouth every 6 (six) hours as needed for mild pain or headache.  . allopurinol (ZYLOPRIM) 100 MG tablet Take 100 mg by mouth 2 (two) times daily.   Marland Kitchen aspirin 81 MG tablet Take 81 mg by mouth daily.  Marland Kitchen atorvastatin (LIPITOR) 80 MG tablet TAKE 1 TABLET DAILY AT 6 PM  . b complex vitamins capsule Take 1 capsule by mouth daily.  . cholecalciferol (VITAMIN D) 1000 UNITS tablet Take 1,000 Units by mouth daily.   Marland Kitchen COLCRYS 0.6 MG tablet Take 0.6 mg by mouth as needed (FOR GOUT).   Marland Kitchen diltiazem (CARDIZEM CD) 120 MG 24 hr capsule TAKE 1 CAPSULE BY MOUTH EVERY DAY  . folic acid (FOLVITE) A999333 MCG tablet Take 400 mcg by mouth 2 (two) times daily.   . hydroxypropyl methylcellulose / hypromellose (ISOPTO TEARS / GONIOVISC) 2.5 % ophthalmic solution Place 1 drop into both eyes 4 (four) times daily.  Marland Kitchen loratadine (CLARITIN) 10 MG tablet Take 10 mg by mouth daily as needed for allergies.  . nitroGLYCERIN (NITROSTAT) 0.4 MG SL tablet Place 1 tablet (0.4 mg total) under the tongue every 5 (five) minutes as needed for chest pain. Max 3 doses.  . Omega-3 Fat Ac-Cholecalciferol (DRY EYE OMEGA BENEFITS/VIT D-3 PO) Take 2 capsules by mouth 2 (two) times daily. Patient takes 4 daily, spreads them out throughout the day   . omeprazole (PRILOSEC) 40 MG capsule Take 40 mg by mouth every morning. 30-60 minutes prior to breakfast.  . PARoxetine (PAXIL) 20 MG tablet Take 0.5 tablets (10 mg total) by mouth every other day.  . testosterone cypionate (DEPOTESTOSTERONE CYPIONATE) 200 MG/ML injection Inject 200 mg into the muscle every 14 (fourteen) days. Next injection: 01/17/2019  . valsartan (DIOVAN) 160 MG tablet TAKE 1 TABLET BY MOUTH EVERY MORNING AND TAKE  1/2 TABLET IN THE EVENING  . XARELTO 20 MG TABS tablet TAKE 1 TABLET BY MOUTH EVERY DAY WITH SUPPER     ROS: Pertinent items noted in HPI and remainder of comprehensive ROS otherwise negative.  Labs/Other Tests and Data Reviewed:    Recent Labs: 01/14/2019: Hemoglobin 14.3; Platelets 220 05/13/2019: BUN 18; Creatinine, Ser 1.18; Potassium 4.6; Sodium 142   Recent Lipid Panel Lab Results  Component Value Date/Time   CHOL 131 01/22/2015 08:12 AM   TRIG 93 01/22/2015 08:12 AM   HDL 49 01/22/2015 08:12 AM   LDLCALC 63 01/22/2015 08:12 AM    Wt Readings from Last 3 Encounters:  08/25/19 245 lb (111.1 kg)  04/29/19 240 lb (108.9 kg)  03/01/19 242 lb (109.8 kg)     Exam:    Vital Signs:  BP 110/80   Pulse 66   Ht 6\' 1"  (  1.854 m)   Wt 245 lb (111.1 kg)   SpO2 96%   BMI 32.32 kg/m    General appearance: alert, no distress and Bandage across his forehead Lungs: No audible wheezing Abdomen: Mildly overweight Skin: Skin color, texture, turgor normal. No rashes or lesions Neurologic: Mental status: Alert, oriented, thought content appropriate Psych: Pleasant  ASSESSMENT & PLAN:    1. A. fib with RVR-spontaneously converted, possibly related to ischemia (not on triple anticoagulation) 2. Chest pain resolved- mildly abnormal nuclear stress test, normal LVEF 60-65% on echo/normal wall motion 3. Paroxysmal atrial flutter - CHADSVASC score of 1 on Xarelto 4. Coronary artery disease status post PCI to the OM with a Xience Alpine DES (2.515 mm) - on low dose Aspirin 5. Residual moderate to severe mid LAD disease 6. Hypertension 7. Dyslipidemia 8. Small perimembranous VSD 9. Fatigue-possibly symptomatic bradycardia  10. Trivial AI 11. 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 Low testosterone 13. OSA on CPAP  Jose Stone has been doing well without a worsening shortness of breath or chest pain.  His repeat CT scan showed an ectatic aortic root measuring 3.8 cm.  There is some  pulmonary artery enlargement and possible pulmonary hypertension related to history of perimembranous VSD.  We will need to monitor this and I would likely recommend a repeat echo next year.  We will repeat labs today has a history of some erythrocytosis and CBC.  Also lipid profile.  He has had some foot swelling but recently had foot surgery.  I recommended compression stockings.  He has been compliant with CPAP.  No other changes today.  Follow-up 6 months.  COVID-19 Education: The signs and symptoms of COVID-19 were discussed with the patient and how to seek care for testing (follow up with PCP or arrange E-visit).  The importance of social distancing was discussed today.  Patient Risk:   After full review of this patients clinical status, I feel that they are at least moderate risk at this time.  Time:   Today, I have spent 25 minutes with the patient with telehealth technology discussing dyslipidemia, hypertension, A. fib, coronary disease, VSD, recent CT scan, dilated aortic root, OSA.     Medication Adjustments/Labs and Tests Ordered: Current medicines are reviewed at length with the patient today.  Concerns regarding medicines are outlined above.   Tests Ordered: No orders of the defined types were placed in this encounter.   Medication Changes: No orders of the defined types were placed in this encounter.   Disposition:  in 6 month(s)  [name], MD, [facility], [place] Director of the Advanced Lipid Disorders &  Cardiovascular Risk Reduction Clinic Diplomate of the American Board of Clinical Lipidology Attending Cardiologist  Direct Dial: [phone]  Fax: [phone]  Website:  [email or web address]  [name], MD  08/25/2019 9:42 AM

## 2019-08-25 NOTE — Patient Instructions (Signed)
Medication Instructions:  Your physician recommends that you continue on your current medications as directed. Please refer to the Current Medication list given to you today.  *If you need a refill on your cardiac medications before your next appointment, please call your pharmacy*  Lab Work: FASTING lab work to check cholesterol & CBC If you have labs (blood work) drawn today and your tests are completely normal, you will receive your results only by: Marland Kitchen MyChart Message (if you have MyChart) OR . A paper copy in the mail If you have any lab test that is abnormal or we need to change your treatment, we will call you to review the results.  Follow-Up: At [facility], you and your health needs are our priority.  As part of our continuing mission to provide you with exceptional heart care, we have created designated Provider Care Teams.  These Care Teams include your primary Cardiologist (physician) and Advanced Practice Providers (APPs -  Physician Assistants and Nurse Practitioners) who all work together to provide you with the care you need, when you need it.  Your next appointment:   6 months  The format for your next appointment:   In Person  Provider:   K. Mali Hilty, MD  Other Instructions

## 2019-09-20 DIAGNOSIS — E785 Hyperlipidemia, unspecified: Secondary | ICD-10-CM | POA: Diagnosis not present

## 2019-09-20 DIAGNOSIS — Z7901 Long term (current) use of anticoagulants: Secondary | ICD-10-CM | POA: Diagnosis not present

## 2019-09-21 LAB — CBC
Hematocrit: 47.5 % (ref 37.5–51.0)
Hemoglobin: 15.6 g/dL (ref 13.0–17.7)
MCH: 25.8 pg — ABNORMAL LOW (ref 26.6–33.0)
MCHC: 32.8 g/dL (ref 31.5–35.7)
MCV: 79 fL (ref 79–97)
Platelets: 207 10*3/uL (ref 150–450)
RBC: 6.04 x10E6/uL — ABNORMAL HIGH (ref 4.14–5.80)
RDW: 17.7 % — ABNORMAL HIGH (ref 11.6–15.4)
WBC: 8.1 10*3/uL (ref 3.4–10.8)

## 2019-09-21 LAB — LIPID PANEL
Chol/HDL Ratio: 2.7 ratio (ref 0.0–5.0)
Cholesterol, Total: 139 mg/dL (ref 100–199)
HDL: 52 mg/dL (ref >39)
LDL Chol Calc (NIH): 74 mg/dL (ref 0–99)
Triglycerides: 64 mg/dL (ref 0–149)
VLDL Cholesterol Cal: 13 mg/dL (ref 5–40)

## 2019-09-23 ENCOUNTER — Other Ambulatory Visit: Payer: Self-pay

## 2019-09-23 ENCOUNTER — Ambulatory Visit (INDEPENDENT_AMBULATORY_CARE_PROVIDER_SITE_OTHER): Payer: Medicare Other | Admitting: Cardiovascular Disease

## 2019-09-23 ENCOUNTER — Encounter

## 2019-09-23 VITALS — BP 140/77 | HR 59 | Ht 73.0 in | Wt 244.8 lb

## 2019-09-23 DIAGNOSIS — Z9989 Dependence on other enabling machines and devices: Secondary | ICD-10-CM | POA: Diagnosis not present

## 2019-09-23 DIAGNOSIS — I480 Paroxysmal atrial fibrillation: Secondary | ICD-10-CM | POA: Diagnosis not present

## 2019-09-23 DIAGNOSIS — E785 Hyperlipidemia, unspecified: Secondary | ICD-10-CM

## 2019-09-23 DIAGNOSIS — Q210 Ventricular septal defect: Secondary | ICD-10-CM

## 2019-09-23 DIAGNOSIS — I10 Essential (primary) hypertension: Secondary | ICD-10-CM

## 2019-09-23 DIAGNOSIS — G4733 Obstructive sleep apnea (adult) (pediatric): Secondary | ICD-10-CM

## 2019-09-23 NOTE — Patient Instructions (Signed)
WE HAVE CHANGED YOU CPAP SETTINGS CALL IF YOU NEED ANYTHING.  Follow-Up: IN 12 months Please call our office 2 months in advance, SEPT 2021 to schedule this NOV 2021 appointment. In Person Shelva Majestic, MD.     Medication Instructions:  The current medical regimen is effective;  continue present plan and medications as directed. Please refer to the Current Medication list given to you today. If you need a refill on your cardiac medications before your next appointment, please call your pharmacy.  At [facility], you and your health needs are our priority.  As part of our continuing mission to provide you with exceptional heart care, we have created designated Provider Care Teams.  These Care Teams include your primary Cardiologist (physician) and Advanced Practice Providers (APPs -  Physician Assistants and Nurse Practitioners) who all work together to provide you with the care you need, when you need it.  Thank you for choosing [facility]!!

## 2019-09-23 NOTE — Progress Notes (Signed)
Cardiology Office Note    Date:  09/24/2019   ID:  Jose Stone, DOB Sep 18, 1946, MRN [id number]  PCP:  Kathyrn Lass, MD  Cardiologist:  Shelva Majestic, MD   New sleep evaluation  History of Present Illness:  Jose Stone is a 73 y.o. male who presents to the office following reinitiation of CPAP therapy.  Jose Stone is followed by Dr. [name] for his primary cardiology care.  He has a history of previous documentation of sleep apnea over 10 years ago and apparently was on CPAP therapy for some time.  In August 2017, he underwent a reassessment of his sleep apnea with a sleep study and apparently on this study he was found to have increased upper airway resistance (U ARS) with an AHI of 2.5/h overall; however, there was moderate sleep apnea with REM sleep with an AHI of 26.7/h.  There was mild oxygen desaturation to a nadir of 86% and he snored with moderate snoring volume.  He also had increased periodic limb movement of sleep.  Apparently, because of his low AHI, he was not approved to resume CPAP therapy.  He is followed by Dr. [name] and has a history of a small VSD for which she has undergone periodic echocardiographic assessment.  A CT scan showed an ectatic root measuring 3.8 cm.  He also had a history of atrial fibrillation with RVR with spontaneously converted and has a history of hypertension and dyslipidemia.  He was referred for repeat sleep study due to worsening snoring witnessed by his wife.  His sleep study was done on April 29, 2019 and during that evaluation he met split-night criteria.  Other diagnostic portion of the study, AHI was 7.7 and his RDI was 14.5/h.  CPAP was implemented and was titrated up to an optimal pressure at 12 cm of water.  He had loud snoring volume during the diagnostic portion of the study.  Oxygen saturation nadir was 90%.  Jose Stone was set up with CPAP on June 23, 2019 with [facility] as his DME company.  A download was obtained in the office  today from August 22, 2019 through September 20, 2019 which confirms 100% usage.  He is averaging 6 hours and 17 minutes of CPAP use per night.  He states he typically goes to bed around 11 PM and oftentimes wakes up between 7 or 7:30 AM.  At a 12 cm water pressure, AHI is 5.0.  There are some nights with no leak but some nights with mild leak.  He has recently been trying the new ResMed F 30i mask which he believes is very comfortable and his leak is improved.  He states that at the beginning of the night, he does not feel like he is getting enough pressure.  He feels improved since reinitiating CPAP therapy with resolution of prior snoring.  He denies any daytime sleepiness.  An Epworth Sleepiness Scale was calculated in the office today and endorsed at 6 as shown below:  Epworth Sleepiness Scale: Situation   Chance of Dozing/Sleeping (0 = never , 1 = slight chance , 2 = moderate chance , 3 = high chance )   sitting and reading 1   watching TV 2   sitting inactive in a public place 0   being a passenger in a motor vehicle for an hour or more 0   lying down in the afternoon 3   sitting and talking to someone 0   sitting quietly after  lunch (no alcohol) 0   while stopped for a few minutes in traffic as the driver 0   Total Score  6   He is unaware of any bruxism, he denies painful restless legs, he denies any hypnagogic hallucinations or cataplectic events.  Past Medical History:  Diagnosis Date  . Atrial fibrillation ([place])   . CAD (coronary artery disease)   . Complication of anesthesia    Hypotensive with general anesth.  . GERD (gastroesophageal reflux disease)   . Hyperlipidemia    VSD  . Hypertension     Past Surgical History:  Procedure Laterality Date  . CARDIAC CATHETERIZATION    . DISTAL INTERPHALANGEAL JOINT FUSION Left 01/19/2019   Procedure: HALLUX INTERPHANGEAL JOINT FUSION LEFT FOOT;  Surgeon: Trula Slade, DPM;  Location: [facility];  Service: Podiatry;  Laterality:  Left;  LEFT BLOCK  . LEFT HEART CATHETERIZATION WITH CORONARY ANGIOGRAM N/A 08/14/2014   Procedure: LEFT HEART CATHETERIZATION WITH CORONARY ANGIOGRAM;  Surgeon: Birdie Riddle, MD;  Location: [facility];  Service: Cardiovascular;  Laterality: N/A;  . MASS EXCISION Left 01/19/2019   Procedure: EXCISION BENIGN LESION 2.OCM LEFT FOOT;  Surgeon: Trula Slade, DPM;  Location: [facility];  Service: Podiatry;  Laterality: Left;  . SHOULDER SURGERY    . TONSILLECTOMY  1955    Current Medications: Outpatient Medications Prior to Visit  Medication Sig Dispense Refill  . acetaminophen (TYLENOL) 500 MG tablet Take 1,000 mg by mouth every 6 (six) hours as needed for mild pain or headache.    . allopurinol (ZYLOPRIM) 100 MG tablet Take 100 mg by mouth 2 (two) times daily.   0  . aspirin 81 MG tablet Take 81 mg by mouth daily.    Marland Kitchen atorvastatin (LIPITOR) 80 MG tablet TAKE 1 TABLET DAILY AT 6 PM 90 tablet 0  . b complex vitamins capsule Take 1 capsule by mouth daily.    . cholecalciferol (VITAMIN D) 1000 UNITS tablet Take 1,000 Units by mouth daily.     Marland Kitchen COLCRYS 0.6 MG tablet Take 0.6 mg by mouth as needed (FOR GOUT).     Marland Kitchen diltiazem (CARDIZEM CD) 120 MG 24 hr capsule TAKE 1 CAPSULE BY MOUTH EVERY DAY 90 capsule 0  . folic acid (FOLVITE) 834 MCG tablet Take 400 mcg by mouth 2 (two) times daily.     . hydroxypropyl methylcellulose / hypromellose (ISOPTO TEARS / GONIOVISC) 2.5 % ophthalmic solution Place 1 drop into both eyes 4 (four) times daily.    Marland Kitchen loratadine (CLARITIN) 10 MG tablet Take 10 mg by mouth daily as needed for allergies.    . nitroGLYCERIN (NITROSTAT) 0.4 MG SL tablet Place 1 tablet (0.4 mg total) under the tongue every 5 (five) minutes as needed for chest pain. Max 3 doses. 25 tablet 5  . Omega-3 Fat Ac-Cholecalciferol (DRY EYE OMEGA BENEFITS/VIT D-3 PO) Take 2 capsules by mouth 2 (two) times daily. Patient takes 4 daily, spreads them out throughout the day     . omeprazole (PRILOSEC) 40  MG capsule Take 40 mg by mouth every morning. 30-60 minutes prior to breakfast.  3  . PARoxetine (PAXIL) 20 MG tablet Take 0.5 tablets (10 mg total) by mouth every other day. 30 tablet 0  . testosterone cypionate (DEPOTESTOSTERONE CYPIONATE) 200 MG/ML injection Inject 200 mg into the muscle every 14 (fourteen) days. Next injection: 01/17/2019  1  . valsartan (DIOVAN) 160 MG tablet TAKE 1 TABLET BY MOUTH EVERY MORNING AND TAKE 1/2 TABLET  IN THE EVENING 135 tablet 1  . XARELTO 20 MG TABS tablet TAKE 1 TABLET BY MOUTH EVERY DAY WITH SUPPER 90 tablet 3   No facility-administered medications prior to visit.      Allergies:   Neosporin [neomycin-bacitracin zn-polymyx], Percocet [oxycodone-acetaminophen], and Vicodin [hydrocodone-acetaminophen]   Social History   Socioeconomic History  . Marital status: Married    Spouse name: Not on file  . Number of children: 2  . Years of education: master's  . Highest education level: Not on file  Occupational History  . 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 Financial resource strain: Not on file  . Food insecurity    Worry: Not on file    Inability: Not on file  . Transportation needs    Medical: Not on file    Non-medical: Not on file  Tobacco Use  . Smoking status: Never Smoker  . Smokeless tobacco: Never Used  Substance and Sexual Activity  . Alcohol use: No  . Drug use: No  . Sexual activity: Yes    Birth control/protection: None  Lifestyle  . Physical activity    Days per week: Not on file    Minutes per session: Not on file  . Stress: Not on file  Relationships  . Social Herbalist on phone: Not on file    Gets together: Not on file    Attends religious service: Not on file    Active member of club or organization: Not on file    Attends meetings of clubs or organizations: Not on file    Relationship status: Not on file  Other Topics Concern  . Not on file  Social History Narrative  . Not on  file    Socially he is married for 49 years.  He is a retired Civil engineer, contracting and retired at age 57.  He has 2 children ages 79 and 101 and grandchildren.  Family History:  The patient's family history includes Aortic aneurysm in his maternal grandmother; CAD in his paternal grandfather; Diabetes in his paternal grandfather; Heart attack in his maternal grandfather; Pancreatic cancer in his mother.   His father died at age 50 with "old age. "His mother died at 67 and had pancreatic cancer.  He has 2 sisters.  ROS General: Negative; No fevers, chills, or night sweats;  HEENT: Negative; No changes in vision or hearing, sinus congestion, difficulty swallowing Pulmonary: Negative; No cough, wheezing, shortness of breath, hemoptysis Cardiovascular: History of small perimembranous VSD; PAF; hypertension, hyperlipidemia GI: Negative; No nausea, vomiting, diarrhea, or abdominal pain GU: Negative; No dysuria, hematuria, or difficulty voiding Musculoskeletal: Negative; no myalgias, joint pain, or weakness Hematologic/Oncology: Negative; no easy bruising, bleeding Endocrine: Negative; no heat/cold intolerance; no diabetes Neuro: Negative; no changes in balance, headaches Skin: Negative; No rashes or skin lesions Psychiatric: Negative; No behavioral problems, depression Sleep: See HPI Other comprehensive 14 point system review is negative.   PHYSICAL EXAM:   VS:  BP 140/77 (BP Location: Right Arm, Patient Position: Sitting, Cuff Size: Large)   Pulse (!) 59   Ht 6' 1"  (1.854 m)   Wt 244 lb 12.8 oz (111 kg)   BMI 32.30 kg/m     Repeat blood pressure by me was 136/74  Wt Readings from Last 3 Encounters:  09/23/19 244 lb 12.8 oz (111 kg)  08/25/19 245 lb (111.1 kg)  04/29/19 240 lb (108.9 kg)    General: Alert, oriented, no distress.  Skin: normal turgor, no rashes, warm and dry HEENT: Normocephalic, atraumatic. Pupils equal round and reactive to light; sclera anicteric; extraocular muscles  intact;  Nose without nasal septal hypertrophy Mouth/Parynx benign; Mallinpatti scale 4 Neck: No JVD, no carotid bruits; normal carotid upstroke Lungs: clear to ausculatation and percussion; no wheezing or rales Chest wall: without tenderness to palpitation Heart: PMI not displaced, RRR, s1 s2 normal, 1/6 systolic murmur, no diastolic murmur, no rubs, gallops, thrills, or heaves Abdomen: soft, nontender; no hepatosplenomehaly, BS+; abdominal aorta nontender and not dilated by palpation. Back: no CVA tenderness Pulses 2+ Musculoskeletal: full range of motion, normal strength, no joint deformities Extremities: no clubbing cyanosis or edema, Homan's sign negative  Neurologic: grossly nonfocal; Cranial nerves grossly wnl Psychologic: Normal mood and affect   Studies/Labs Reviewed:   EKG:  EKG is ordered today. ECG (independently read by me): Normal sinus rhythm with PAC mild RV conduction delay.  Normal intervals  Recent Labs: BMP Latest Ref Rng & Units 05/13/2019 01/14/2019 07/14/2018  Glucose 65 - 99 mg/dL 92 94 91  BUN 8 - 27 mg/dL 18 20 24(H)  Creatinine 0.76 - 1.27 mg/dL 1.18 1.10 1.29(H)  BUN/Creat Ratio 10 - 24 15 - -  Sodium 134 - 144 mmol/L 142 138 140  Potassium 3.5 - 5.2 mmol/L 4.6 4.4 3.3(L)  Chloride 96 - 106 mmol/L 105 104 105  CO2 20 - 29 mmol/L 23 26 26   Calcium 8.6 - 10.2 mg/dL 9.2 9.2 8.6(L)     Hepatic Function Latest Ref Rng & Units 07/13/2018 11/18/2015  Total Protein 6.5 - 8.1 g/dL 6.2(L) 6.3(L)  Albumin 3.5 - 5.0 g/dL 3.7 3.3(L)  AST 15 - 41 U/L 56(H) 31  ALT 0 - 44 U/L 24 24  Alk Phosphatase 38 - 126 U/L 67 86  Total Bilirubin 0.3 - 1.2 mg/dL 1.8(H) 0.4    CBC Latest Ref Rng & Units 09/20/2019 01/14/2019 07/14/2018  WBC 3.4 - 10.8 x10E3/uL 8.1 10.0 8.6  Hemoglobin 13.0 - 17.7 g/dL 15.6 14.3 13.2  Hematocrit 37.5 - 51.0 % 47.5 46.2 40.6  Platelets 150 - 450 x10E3/uL 207 220 180   Lab Results  Component Value Date   MCV 79 09/20/2019   MCV 75.9 (L)  01/14/2019   MCV 77.9 (L) 07/14/2018   Lab Results  Component Value Date   TSH 4.570 (H) 08/12/2014   No results found for: HGBA1C   BNP No results found for: BNP  ProBNP    Component Value Date/Time   PROBNP 193.9 (H) 08/12/2014 1614     Lipid Panel     Component Value Date/Time   CHOL 139 09/20/2019 0933   CHOL 131 01/22/2015 0812   TRIG 64 09/20/2019 0933   TRIG 93 01/22/2015 0812   HDL 52 09/20/2019 0933   HDL 49 01/22/2015 0812   CHOLHDL 2.7 09/20/2019 0933   LDLCALC 74 09/20/2019 0933   LDLCALC 63 01/22/2015 0812   LABVLDL 13 09/20/2019 0933     RADIOLOGY: No results found.   Additional studies/ records that were reviewed today include:  I reviewed the records of Dr. [name], the patient's 2017 sleep study, 2020 sleep study, and obtained a download from October 19 through September 20, 2019.   ASSESSMENT:    1. OSA on CPAP   2. PAF (paroxysmal atrial fibrillation) ([place])   3. Essential hypertension   4. VSD (ventricular septal defect)   5. 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 [name] and has a history of a small perimembranous VSD, hypertension, PAF on anticoagulation therapy, hyperlipidemia, and mild ectatic dilated aortic root.  Remotely he was diagnosed with sleep apnea over 10 years ago and for period of time was on CPAP therapy.  On his sleep study in 2017 he was found to have moderate sleep apnea during REM sleep but overall his AHI was only 2.5/h and he was not approved to reinstitute CPAP therapy.  I thoroughly reviewed his most recent sleep study which again confirms obstructive sleep apnea.  He has now been on CPAP therapy since June 23, 2019 and is meeting compliance standards.  He states however that particularly in the early portion of the night he does not feel that he is getting adequate pressure.  I interrogated his CPAP unit today.  Apparently  his ramp time had been initially set at 45 minutes.  I am recommending he discontinue the ramp time and will change him to an auto mode with a CPAP minimum of 10 up to a potential maximum of 20.  He now has a new ResMed F 30i mask which is well-tolerated and does not have any significant leak.  I discussed the effect of sleep apnea on normal sleep architecture.  In the past he was found to have moderate sleep apnea during REM sleep and we discussed that the preponderance of REM sleep occurs in the second half of the night importance of using CPAP for the nights entirety.  We discussed optimal sleep duration at 8 hours.  We discussed potential adverse cardiovascular consequences if CPAP therapy is not utilized particularly with his PAF history.  I answered all his questions.  I will see him in 1 year for reevaluation per Medicare requirements.   Medication Adjustments/Labs and Tests Ordered: Current medicines are reviewed at length with the patient today.  Concerns regarding medicines are outlined above.  Medication changes, Labs and Tests ordered today are listed in the Patient Instructions below. Patient Instructions  WE HAVE CHANGED YOU CPAP SETTINGS CALL IF YOU NEED ANYTHING.  Follow-Up: IN 12 months Please call our office 2 months in advance, SEPT 2021 to schedule this NOV 2021 appointment. In Person Shelva Majestic, MD.     Medication Instructions:  The current medical regimen is effective;  continue present plan and medications as directed. Please refer to the Current Medication list given to you today. If you need a refill on your cardiac medications before your next appointment, please call your pharmacy.  At [facility], you and your health needs are our priority.  As part of our continuing mission to provide you with exceptional heart care, we have created designated Provider Care Teams.  These Care Teams include your primary Cardiologist (physician) and Advanced Practice Providers (APPs -   Physician Assistants and Nurse Practitioners) who all work together to provide you with the care you need, when you need it.  Thank you for choosing [facility] at [facility]!!          Signed, Shelva Majestic, MD  09/24/2019 12:56 PM    [facility] [address], [place], [place], [place]  [address] Phone: [phone]

## 2019-09-24 ENCOUNTER — Encounter: Payer: Self-pay | Admitting: Cardiovascular Disease

## 2019-10-03 DIAGNOSIS — I10 Essential (primary) hypertension: Secondary | ICD-10-CM | POA: Diagnosis not present

## 2019-10-03 DIAGNOSIS — I4891 Unspecified atrial fibrillation: Secondary | ICD-10-CM | POA: Diagnosis not present

## 2019-10-03 DIAGNOSIS — I2510 Atherosclerotic heart disease of native coronary artery without angina pectoris: Secondary | ICD-10-CM | POA: Diagnosis not present

## 2019-10-03 DIAGNOSIS — E785 Hyperlipidemia, unspecified: Secondary | ICD-10-CM | POA: Diagnosis not present

## 2019-10-20 ENCOUNTER — Other Ambulatory Visit: Payer: Self-pay

## 2019-10-20 ENCOUNTER — Ambulatory Visit (INDEPENDENT_AMBULATORY_CARE_PROVIDER_SITE_OTHER): Payer: Medicare Other | Admitting: Podiatry

## 2019-10-20 ENCOUNTER — Ambulatory Visit (INDEPENDENT_AMBULATORY_CARE_PROVIDER_SITE_OTHER): Payer: Medicare Other

## 2019-10-20 DIAGNOSIS — L03032 Cellulitis of left toe: Secondary | ICD-10-CM

## 2019-10-20 DIAGNOSIS — L97522 Non-pressure chronic ulcer of other part of left foot with fat layer exposed: Secondary | ICD-10-CM

## 2019-10-20 DIAGNOSIS — L97501 Non-pressure chronic ulcer of other part of unspecified foot limited to breakdown of skin: Secondary | ICD-10-CM | POA: Diagnosis not present

## 2019-10-20 DIAGNOSIS — L02612 Cutaneous abscess of left foot: Secondary | ICD-10-CM | POA: Diagnosis not present

## 2019-10-20 MED ORDER — CEPHALEXIN 500 MG PO CAPS: 500.0000 mg | ORAL_CAPSULE | Freq: Four times a day (QID) | ORAL | 0 refills | Status: DC

## 2019-10-20 MED ORDER — SILVER SULFADIAZINE 1 % EX CREA: 1.0000 "application " | TOPICAL_CREAM | Freq: Every day | CUTANEOUS | 0 refills | Status: DC

## 2019-10-23 LAB — WOUND CULTURE
MICRO NUMBER:: 1208703
RESULT:: NO GROWTH
SPECIMEN QUALITY:: ADEQUATE

## 2019-10-23 NOTE — Progress Notes (Signed)
Subjective: 73 year old male presents the office today for concerns of a blister, infection to his left foot which started about 2 days ago.  He states he is starting to get the callus coming back to his left big toe and he did change with a different pair shoes without the inserts and then after that time he noticed the blister.  The blister did pop on his way to the office today.  He has noticed swelling as well as warmth of his left foot.  Patient no recent treatment.Denies any systemic complaints such as fevers, chills, nausea, vomiting. No acute changes since last appointment, and no other complaints at this time.   Objective: AAO x3, NAD DP/PT pulses palpable bilaterally, CRT less than 3 seconds Popped blister present on the left hallux.  There is 1 small area of the blister that still had some fluid which I did take a culture of.  There is erythema to the area and there is warmth of the foot.  There is no further ulcerations identified or any open sores.  There is no further fluctuation crepitation.  No malodor.  The blister does seem to go underneath the calcium after debridement of this today there was superficial wound present but there is no probing, undermining or tunneling.  There appeared to be more of epidermolysis.  no open lesions or pre-ulcerative lesions.  No pain with calf compression, swelling, warmth, erythema  Assessment: Blister, infection left foot  Plan: -All treatment options discussed with the patient including all alternatives, risks, complications.  -X-rays obtained reviewed.  No obvious signs of acute osteomyelitis identified there is no soft tissue emphysema. -I debrided the callus today and it appeared that the blister was going under the callus and removed all nonviable tissue utilizing tissue nippers was a 312 with scalpel.  Clean the area.  Silvadene was applied followed by dressing.  Also prescribed Silvadene.  Also prescribed Keflex. -On evaluation of his shoes  they are 78 to 71 years old and worn out.  We discussed changing shoes and also will have him follow-up with Liliane Channel for modification of the orthotics as he has worn down the orthotic in the area of the hallux. -Monitor for any clinical signs or symptoms of infection and directed to call the office immediately should any occur or go to the ER. -Patient encouraged to call the office with any questions, concerns, change in symptoms.   Trula Slade DPM

## 2019-11-01 ENCOUNTER — Ambulatory Visit: Payer: Medicare Other | Admitting: Orthotics

## 2019-11-01 ENCOUNTER — Ambulatory Visit (INDEPENDENT_AMBULATORY_CARE_PROVIDER_SITE_OTHER): Payer: Medicare Other | Admitting: Podiatry

## 2019-11-01 ENCOUNTER — Other Ambulatory Visit: Payer: Self-pay

## 2019-11-01 DIAGNOSIS — L97501 Non-pressure chronic ulcer of other part of unspecified foot limited to breakdown of skin: Secondary | ICD-10-CM

## 2019-11-01 DIAGNOSIS — L02612 Cutaneous abscess of left foot: Secondary | ICD-10-CM

## 2019-11-01 DIAGNOSIS — L03032 Cellulitis of left toe: Secondary | ICD-10-CM

## 2019-11-01 DIAGNOSIS — M2012 Hallux valgus (acquired), left foot: Secondary | ICD-10-CM

## 2019-11-01 NOTE — Progress Notes (Signed)
Added further offload to hallux on left; healing well.

## 2019-11-02 NOTE — Progress Notes (Signed)
Subjective: 73 year old male presents the office today for follow-up evaluation of infection on posterior left foot.  He states he has been doing much better.  He has been wearing the offloading shoe.  He is finished a course of antibiotics but any side effects.  He has no new concerns today and he is scheduled to see Liliane Channel. Denies any systemic complaints such as fevers, chills, nausea, vomiting. No acute changes since last appointment, and no other complaints at this time.   Objective: AAO x3, NAD DP/PT pulses palpable bilaterally, CRT less than 3 seconds Hyperkeratotic tissue present left medial hallux without any underlying ulceration drainage or any signs of infection.  The cellulitis, erythema and warmth is resolved to the foot.  Flatfoot deformity is present.  No pain with calf compression, swelling, warmth, erythema  Assessment: Recurrent callus left hallux  Plan: -All treatment options discussed with the patient including all alternatives, risks, complications.  -Infection appears to be resolving, no open lesions.  His orthotics have worn out.  He did see Liliane Channel today for modifications.  Discussed change in shoes.  Ultimately if he changes of shoes as well as more supportive inserts will be beneficial for him to help prevent reoccurrence. -Patient encouraged to call the office with any questions, concerns, change in symptoms.   Trula Slade DPM

## 2019-11-07 ENCOUNTER — Other Ambulatory Visit: Payer: Self-pay | Admitting: Internal Medicine

## 2019-11-09 DIAGNOSIS — R350 Frequency of micturition: Secondary | ICD-10-CM | POA: Diagnosis not present

## 2019-11-09 DIAGNOSIS — E291 Testicular hypofunction: Secondary | ICD-10-CM | POA: Diagnosis not present

## 2019-11-09 DIAGNOSIS — N35010 Post-traumatic urethral stricture, male, meatal: Secondary | ICD-10-CM | POA: Diagnosis not present

## 2019-11-09 DIAGNOSIS — R300 Dysuria: Secondary | ICD-10-CM | POA: Diagnosis not present

## 2019-11-11 DIAGNOSIS — I10 Essential (primary) hypertension: Secondary | ICD-10-CM | POA: Diagnosis not present

## 2019-11-11 DIAGNOSIS — I4891 Unspecified atrial fibrillation: Secondary | ICD-10-CM | POA: Diagnosis not present

## 2019-11-11 DIAGNOSIS — E785 Hyperlipidemia, unspecified: Secondary | ICD-10-CM | POA: Diagnosis not present

## 2019-11-11 DIAGNOSIS — I2510 Atherosclerotic heart disease of native coronary artery without angina pectoris: Secondary | ICD-10-CM | POA: Diagnosis not present

## 2019-12-02 ENCOUNTER — Ambulatory Visit: Payer: Medicare Other

## 2019-12-11 ENCOUNTER — Ambulatory Visit: Payer: Medicare Other | Attending: Internal Medicine

## 2019-12-11 DIAGNOSIS — Z23 Encounter for immunization: Secondary | ICD-10-CM | POA: Insufficient documentation

## 2019-12-11 NOTE — Progress Notes (Signed)
   Covid-19 Vaccination Clinic  Name:  Jose Stone    MRN: [id number] DOB: [birth year]  12/11/2019  [name] was observed post Covid-19 immunization for 30 minutes based on pre-vaccination screening without incidence. He was provided with Vaccine Information Sheet and instruction to access the V-Safe system.   [name] was instructed to call 911 with any severe reactions post vaccine: Marland Kitchen Difficulty breathing  . Swelling of your face and throat  . A fast heartbeat  . A bad rash all over your body  . Dizziness and weakness    Immunizations Administered    Name Date Dose VIS Date Route   Pfizer COVID-19 Vaccine 12/11/2019  9:07 AM 0.3 mL 10/14/2019 Intramuscular   Manufacturer: [place]   Lot: [id number]   [place]: [id number]

## 2019-12-13 ENCOUNTER — Ambulatory Visit: Payer: Medicare Other | Admitting: Podiatry

## 2019-12-19 DIAGNOSIS — M67912 Unspecified disorder of synovium and tendon, left shoulder: Secondary | ICD-10-CM | POA: Diagnosis not present

## 2019-12-19 DIAGNOSIS — M25531 Pain in right wrist: Secondary | ICD-10-CM | POA: Diagnosis not present

## 2019-12-23 ENCOUNTER — Ambulatory Visit: Payer: Medicare Other

## 2019-12-28 ENCOUNTER — Other Ambulatory Visit: Payer: Self-pay | Admitting: Cardiovascular Disease

## 2020-01-04 DIAGNOSIS — D2262 Melanocytic nevi of left upper limb, including shoulder: Secondary | ICD-10-CM | POA: Diagnosis not present

## 2020-01-04 DIAGNOSIS — Z85820 Personal history of malignant melanoma of skin: Secondary | ICD-10-CM | POA: Diagnosis not present

## 2020-01-04 DIAGNOSIS — L570 Actinic keratosis: Secondary | ICD-10-CM | POA: Diagnosis not present

## 2020-01-04 DIAGNOSIS — L821 Other seborrheic keratosis: Secondary | ICD-10-CM | POA: Diagnosis not present

## 2020-01-04 DIAGNOSIS — D2271 Melanocytic nevi of right lower limb, including hip: Secondary | ICD-10-CM | POA: Diagnosis not present

## 2020-01-04 DIAGNOSIS — D2272 Melanocytic nevi of left lower limb, including hip: Secondary | ICD-10-CM | POA: Diagnosis not present

## 2020-01-04 DIAGNOSIS — D225 Melanocytic nevi of trunk: Secondary | ICD-10-CM | POA: Diagnosis not present

## 2020-01-04 DIAGNOSIS — D1801 Hemangioma of skin and subcutaneous tissue: Secondary | ICD-10-CM | POA: Diagnosis not present

## 2020-01-05 ENCOUNTER — Ambulatory Visit: Payer: Medicare Other | Attending: Internal Medicine

## 2020-01-05 DIAGNOSIS — Z23 Encounter for immunization: Secondary | ICD-10-CM | POA: Insufficient documentation

## 2020-01-05 NOTE — Progress Notes (Signed)
   Covid-19 Vaccination Clinic  Name:  Jose Stone    MRN: [id number] DOB: [birth year]  01/05/2020  [name] was observed post Covid-19 immunization for 15 minutes without incident. He was provided with Vaccine Information Sheet and instruction to access the V-Safe system.   [name] was instructed to call 911 with any severe reactions post vaccine: Marland Kitchen Difficulty breathing  . Swelling of face and throat  . A fast heartbeat  . A bad rash all over body  . Dizziness and weakness   Immunizations Administered    Name Date Dose VIS Date Route   Pfizer COVID-19 Vaccine 01/05/2020  9:43 AM 0.3 mL 10/14/2019 Intramuscular   Manufacturer: [place]   Lot: [id number]   [place]: [id number]

## 2020-01-09 DIAGNOSIS — I4891 Unspecified atrial fibrillation: Secondary | ICD-10-CM | POA: Diagnosis not present

## 2020-01-09 DIAGNOSIS — I2510 Atherosclerotic heart disease of native coronary artery without angina pectoris: Secondary | ICD-10-CM | POA: Diagnosis not present

## 2020-01-09 DIAGNOSIS — E785 Hyperlipidemia, unspecified: Secondary | ICD-10-CM | POA: Diagnosis not present

## 2020-01-09 DIAGNOSIS — I10 Essential (primary) hypertension: Secondary | ICD-10-CM | POA: Diagnosis not present

## 2020-01-16 DIAGNOSIS — M1812 Unilateral primary osteoarthritis of first carpometacarpal joint, left hand: Secondary | ICD-10-CM | POA: Diagnosis not present

## 2020-01-16 DIAGNOSIS — M67912 Unspecified disorder of synovium and tendon, left shoulder: Secondary | ICD-10-CM | POA: Diagnosis not present

## 2020-01-20 DIAGNOSIS — S46002D Unspecified injury of muscle(s) and tendon(s) of the rotator cuff of left shoulder, subsequent encounter: Secondary | ICD-10-CM | POA: Diagnosis not present

## 2020-01-26 DIAGNOSIS — S46002D Unspecified injury of muscle(s) and tendon(s) of the rotator cuff of left shoulder, subsequent encounter: Secondary | ICD-10-CM | POA: Diagnosis not present

## 2020-01-30 DIAGNOSIS — S46002D Unspecified injury of muscle(s) and tendon(s) of the rotator cuff of left shoulder, subsequent encounter: Secondary | ICD-10-CM | POA: Diagnosis not present

## 2020-02-01 ENCOUNTER — Other Ambulatory Visit: Payer: Self-pay | Admitting: Internal Medicine

## 2020-02-01 DIAGNOSIS — S46002D Unspecified injury of muscle(s) and tendon(s) of the rotator cuff of left shoulder, subsequent encounter: Secondary | ICD-10-CM | POA: Diagnosis not present

## 2020-02-06 DIAGNOSIS — S46002D Unspecified injury of muscle(s) and tendon(s) of the rotator cuff of left shoulder, subsequent encounter: Secondary | ICD-10-CM | POA: Diagnosis not present

## 2020-02-08 DIAGNOSIS — S46002D Unspecified injury of muscle(s) and tendon(s) of the rotator cuff of left shoulder, subsequent encounter: Secondary | ICD-10-CM | POA: Diagnosis not present

## 2020-02-13 DIAGNOSIS — M67912 Unspecified disorder of synovium and tendon, left shoulder: Secondary | ICD-10-CM | POA: Diagnosis not present

## 2020-02-15 DIAGNOSIS — S46002D Unspecified injury of muscle(s) and tendon(s) of the rotator cuff of left shoulder, subsequent encounter: Secondary | ICD-10-CM | POA: Diagnosis not present

## 2020-02-20 DIAGNOSIS — S46002D Unspecified injury of muscle(s) and tendon(s) of the rotator cuff of left shoulder, subsequent encounter: Secondary | ICD-10-CM | POA: Diagnosis not present

## 2020-02-22 ENCOUNTER — Encounter: Payer: Self-pay | Admitting: Internal Medicine

## 2020-02-22 ENCOUNTER — Ambulatory Visit (INDEPENDENT_AMBULATORY_CARE_PROVIDER_SITE_OTHER): Payer: Medicare Other | Admitting: Internal Medicine

## 2020-02-22 ENCOUNTER — Other Ambulatory Visit: Payer: Self-pay

## 2020-02-22 VITALS — BP 122/84 | HR 136 | Resp 95 | Ht 73.0 in | Wt 247.0 lb

## 2020-02-22 DIAGNOSIS — G4733 Obstructive sleep apnea (adult) (pediatric): Secondary | ICD-10-CM

## 2020-02-22 DIAGNOSIS — Z9989 Dependence on other enabling machines and devices: Secondary | ICD-10-CM

## 2020-02-22 DIAGNOSIS — I4891 Unspecified atrial fibrillation: Secondary | ICD-10-CM

## 2020-02-22 DIAGNOSIS — Z01812 Encounter for preprocedural laboratory examination: Secondary | ICD-10-CM | POA: Diagnosis not present

## 2020-02-22 MED ORDER — VALSARTAN 160 MG PO TABS: 160.0000 mg | ORAL_TABLET | Freq: Every morning | ORAL | 0 refills | Status: DC

## 2020-02-22 MED ORDER — DILTIAZEM HCL ER COATED BEADS 120 MG PO CP24: 240.0000 mg | ORAL_CAPSULE | Freq: Every day | ORAL | 1 refills | Status: DC

## 2020-02-22 NOTE — Progress Notes (Addendum)
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 This is never caused issues in fact she's had right and left heart catheterizations dating back to the 1970s. 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 Other coronary risk factors include hypertension, dyslipidemia and family history of heart disease. He was sent for cardiac catheterization in 2006 for follow-up of VSD, at which time he had an echocardiogram which showed mild aortic regurgitation, a small membranous VSD and left atrial enlargement with an EF of 70%. He had a stress test at that time which showed findings concerning for him for a septal and inferoapical ischemia. He therefore was referred to left heart catheterization with strep demonstrated a 50-70% mid LAD stenosis and mild disease of the mid circumflex and first obtuse marginal branches. He's been managed medically since that time and previously saw Dr. Daneen Schick.  Recently he's been having some symptoms of sweating, palpitations and dizziness and eventually was found to be in atrial fibrillation when he presented to the hospital. At that time his troponin was elevated. He is placed on diltiazem which slowed his heart rate and he converted back to sinus spontaneously. Based on his elevated troponin, he was referred for left heart catheterization by Dr. Doylene Canard. This demonstrated a 60-70% mid LAD stenosis and a 90% obtuse marginal stenosis. Subsequently he underwent coronary intervention with placement of a 2.5 x 15 mm Xience Alpine stent to the mid OM. After this he became asymptomatic. He was going to have stress testing to determine the significance of his mid LAD lesion however it appears to be not significantly changed since his  heart catheterization 2006. He reports no further symptoms of atrial fibrillation but is concerned about bradycardia. He was not referred for cardiac rehabilitation due to the thought that he may need additional intervention to the LAD.  Jose Stone returns today for follow-up. She's done quite well in cardiac rehabilitation. At his last office visit we discontinued his diltiazem for bradycardia. He still has a low heart rate which has some degree of chronotropic incompetence. He reports no recurrent atrial fibrillation that were aware of. He continues on dual antiplatelet therapy. He had a stress test prior to enrolling in cardiac redilatation which was negative for ischemia.  I saw Jose Stone back in the office today. He is accompanied by his wife who was a cardiac nurse at the [employer] in [place] for a number years in the 1970s. They brought in pronounced of his heart rate monitor and had several questions about his heart rate and arrhythmias. My interpretation of the monitor indicates that he had some episodes of short bursts of atrial tachycardia but no evidence for A. fib. He also has PACs. Heart rate however was a low and remained that way despite stopping his calcium channel blocker. He was recently in the emergency room and was noted to have some paroxysmal tachycardia in the ER with a right bundle-branch pattern. This is what led to monitor placement. He reports fatigue which could be certainly related to his lower heart rate. He also says that he's developed gynecomastia which he relates to [place]. I've done a search on the medicine and do not see that however he reports  in the product labeling.  03/21/2016  Jose Stone was seen today in the office for an acute visit. He noted this morning shortly after waking up his heart rate was elevated in the low 100s. It improved somewhat and then around 9:00 it increased up to about 130. The heart rate is been stable since then and he called in for a new acute  triage visit. He reports feeling somewhat fatigued and noting that his heart rate is elevated. He denies any worsening shortness of breath or chest pain. EKG in the office shows atrial flutter with a regular rhythm at 131 bpm. As per recent notes he was previous on aspirin and clopidogrel but the caput ago was stopped. He was not on triple therapy. Currently he is only on low-dose aspirin. 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 I'm concerned about up titrating his medications because she's had history of slow ventricular rates in the past. We discussed a number of options but ultimately the best option is for him to undergo cardioversion to get back into normal rhythm.  04/17/2016  Jose Stone was seen back in the office today for follow-up. At his last office visit he was noted to be in atrial flutter with a rate of 131. He was referred directly to the ER and underwent cardioversion after being started on Xarelto. He is remained on Xarelto since then without any bleeding problems. I'm happy to say he is maintaining sinus rhythm today. He does report significant fatigue however. He is mostly compliant with CPAP however he had a sleep study a number of years ago and he is set possibly to low of an airway setting. He's had weight gain since then. He also has been having some bradycardia with heart rates in the 40s and 50s. He is on low-dose beta blocker but I been hesitant to stop that because of his RVR. He does report heart rate increases with exercise.   06/20/2016  Jose Stone returns today for follow-up. He underwent a sleep study on 06/05/2016. He did have a diagnosis of sleep apnea however that study indicated a very low AHI of 2.5 events per hour. He had actually about 25 events per hour in rem sleep, but overall the REM sleep latency was only about 8%. Ultimately he was diagnosis not having any significant apnea. This is led to some confusion as he previously was  wearing [place] CPAP device. He reports no change in his symptoms of fatigue which we thought could be related to sleep apnea. It also may be related to bradycardia and his beta blocker.  09/23/2016  Jose Stone comes back today and is doing well. His fatigue has improved somewhat with discontinuing his B-Blocker. Heartrate is increased. I spoke with Dr. Claiborne Billings about the possibility of OSA and he is not felt to have this. Dr. Claiborne Billings noted that RLS was present. He does not desire treatment for this. Blood pressure was somewhat elevated today. EKG shows sinus with PAC's. I reviewed a spreadsheet of his blood pressures today.  03/31/2017  Jose Stone was seen today in follow-up. Overall he is doing very well. He denies any chest pain worsening shortness of breath. Blood pressure is well-controlled. He brought in a graft indicating excellent control blood pressure and heart rate. He said no breakthrough A. fib. His recent lab work was provided by his PCP. Total cholesterol 134, trig Richards 85, HDL-C 49, LDL-C 68, non-HDL of 85. This represents excellent control. He is  planning a trip out [place] is been provided some Diamox to use as needed. He denies any bleeding problems on Xarelto.  09/03/2017  Jose Stone returns today for follow-up.  He underwent a recent stress test after I saw him in the emergency department for chest pain.  At the time he took some nitroglycerin which resulted in improvement in his symptoms however ruled out for MI by negative troponins.  His stress test demonstrated a small area of reversible ischemia in the inferolateral wall.  It was interpreted as intermediate risk.  Since then he has had no further chest pain.  I discussed the findings with him today and we talked about various options including medical therapy and/or repeat cardiac catheterization.  As he remains asymptomatic, I do not feel that medication adjustments necessary at this time.  Finally, he notes that he has had some  morning hypertension which is more significant than his daytime readings.  10/15/2017  Bradycardia returns today for follow-up.  As previously noted he had a mildly abnormal stress test and was having some chest discomfort.  That is completely resolved.  We have moved around his medications a little bit but he notes that his blood pressure still remains elevated at times during the day.  Echo was performed on 10/01/2017 which showed LVEF that was higher at 60-65%, stable dilated aortic root at 4.4 cm and the ascending aorta measured 4.0 cm.  No regional wall motion abnormalities were noted.  01/15/2018  Jose Stone returns today for follow-up of his blood pressure.  He reports after adjusting his medicines that in general his blood pressures been well controlled.  He denies any recurrent episodes of dizziness or lightheadedness.  Recently had some testing of testosterone.  He has had a number of urologic issues and possible damage to blood flow to 1 of the testicles.  That coupled with age I suspect his lead to low testosterone.  Recently as low as the 200s.  His urologist Dr. Junious Silk, is recommending supplemental testosterone.  He is asking for my cardiac opinion whether this is safe to use.  07/19/2018  Jose Stone returns today for hospital follow-up.  Fortunately was seen in the ER for acute onset A. fib with RVR.  This is in the setting of what appears to be marked dehydration.  He was working outside in the heat and developed acute renal failure.  Potassium was up to 6.1.  He responded to hydration and repeat renal profile last week showed that his creatinine returned to baseline.  Potassium was actually slightly low at 3.3.  He is not on repletion.  I stopped his chlorthalidone and reduced his ARB to 160 mg valsartan daily.  He denies any recurrent A. Fib.  11/23/2018  Jose Stone seen today for labile blood pressures.  He recently messaged the office and was noted to have elevated blood pressures.   Previously had been on higher dose blood pressure medication however this was reduced including stopping chlorthalidone and reducing his ARB because of hyperkalemia.  The decrease in ARB probably was the reason why his blood pressures have remained elevated.  His wife is also concerned about a wider pulse pressure which at times can be close to 100 mmHg.  Despite this diastolics have not been below 50.  EKG shows sinus rhythm with sinus arrhythmia incomplete right bundle branch today at 66.  02/22/2020  Jose Stone seen today in follow-up.  Overall he seems to be doing well.  He had some  recent surgery including a brow/lid lift.  He is also had a toe fusion.  Of note, he was noted to be tachycardic today and he seemed to be unaware of this.  EKG was performed which showed A. fib with RVR.  He has had this in the past and is spontaneously converted, in fact once we set him up for cardioversion.  He is on Xarelto and low-dose diltiazem.  He is scheduled to have an MRI tomorrow of his shoulder with a plan for upcoming shoulder surgery.  PMHx:  Past Medical History:  Diagnosis Date  . Atrial fibrillation ([place])   . CAD (coronary artery disease)   . Complication of anesthesia    Hypotensive with general anesth.  . GERD (gastroesophageal reflux disease)   . Hyperlipidemia    VSD  . Hypertension     Past Surgical History:  Procedure Laterality Date  . CARDIAC CATHETERIZATION    . DISTAL INTERPHALANGEAL JOINT FUSION Left 01/19/2019   Procedure: HALLUX INTERPHANGEAL JOINT FUSION LEFT FOOT;  Surgeon: Trula Slade, DPM;  Location: [facility];  Service: Podiatry;  Laterality: Left;  LEFT BLOCK  . LEFT HEART CATHETERIZATION WITH CORONARY ANGIOGRAM N/A 08/14/2014   Procedure: LEFT HEART CATHETERIZATION WITH CORONARY ANGIOGRAM;  Surgeon: Birdie Riddle, MD;  Location: [facility] CATH LAB;  Service: Cardiovascular;  Laterality: N/A;  . MASS EXCISION Left 01/19/2019   Procedure: EXCISION BENIGN LESION 2.OCM LEFT FOOT;   Surgeon: Trula Slade, DPM;  Location: [facility];  Service: Podiatry;  Laterality: Left;  . SHOULDER SURGERY    . TONSILLECTOMY  1955    FAMHx:  Family History  Problem Relation Age of Onset  . Pancreatic cancer Mother   . Aortic aneurysm Maternal Grandmother   . Heart attack Maternal Grandfather   . Diabetes Paternal Grandfather   . CAD Paternal Grandfather     SOCHx:   reports that he has never smoked. He has never used smokeless tobacco. He reports that he does not drink alcohol or use drugs.  ALLERGIES:  Allergies  Allergen Reactions  . Codeine Nausea Only  . Mupirocin Itching  . Neosporin [Neomycin-Bacitracin Zn-Polymyx] Rash  . Percocet [Oxycodone-Acetaminophen] Rash    Patient can tolerate acetaminophen solely  . Vicodin [Hydrocodone-Acetaminophen] Rash    Patient can tolerate acetaminophen solely    ROS: Pertinent items noted in HPI and remainder of comprehensive ROS otherwise negative.  HOME MEDS: Current Outpatient Medications  Medication Sig Dispense Refill  . acetaminophen (TYLENOL) 500 MG tablet Take 1,000 mg by mouth every 6 (six) hours as needed for mild pain or headache.    . allopurinol (ZYLOPRIM) 100 MG tablet Take 100 mg by mouth 2 (two) times daily.   0  . aspirin 81 MG tablet Take 81 mg by mouth daily.    Marland Kitchen atorvastatin (LIPITOR) 80 MG tablet TAKE 1 TABLET BY MOUTH DAILY AT 6 PM 90 tablet 0  . COLCRYS 0.6 MG tablet Take 0.6 mg by mouth as needed (FOR GOUT).     Marland Kitchen diltiazem (CARDIZEM CD) 120 MG 24 hr capsule TAKE 1 CAPSULE BY MOUTH EVERY DAY 90 capsule 2  . loratadine (CLARITIN) 10 MG tablet Take 10 mg by mouth daily as needed for allergies.    . Multiple Vitamins-Minerals (CENTRUM SILVER ULTRA MENS PO) Take by mouth.    . nitroGLYCERIN (NITROSTAT) 0.4 MG SL tablet Place 1 tablet (0.4 mg total) under the tongue every 5 (five) minutes as needed for chest pain. Max 3 doses. 25  tablet 5  . Omega-3 Fat Ac-Cholecalciferol (DRY EYE OMEGA BENEFITS/VIT D-3  PO) Take 2 capsules by mouth 2 (two) times daily. Patient takes 4 daily, spreads them out throughout the day     . omeprazole (PRILOSEC) 40 MG capsule Take 40 mg by mouth every morning. 30-60 minutes prior to breakfast.  3  . PARoxetine (PAXIL) 20 MG tablet Take 0.5 tablets (10 mg total) by mouth every other day. 30 tablet 0  . phenazopyridine (PYRIDIUM) 200 MG tablet Take 200 mg by mouth 3 (three) times daily as needed for pain.    Vladimir Faster Glycol-Propyl Glycol (SYSTANE OP) Apply to eye.    . testosterone cypionate (DEPOTESTOSTERONE CYPIONATE) 200 MG/ML injection Inject 200 mg into the muscle every 14 (fourteen) days. Next injection: 01/17/2019  1  . valsartan (DIOVAN) 160 MG tablet TAKE 1 TABLET BY MOUTH EVERY MORNING AND TAKE 1/2 TABLET IN THE EVENING 135 tablet 1  . XARELTO 20 MG TABS tablet TAKE 1 TABLET BY MOUTH EVERY DAY WITH SUPPER 90 tablet 3   No current facility-administered medications for this visit.    LABS/IMAGING: No results found for this or any previous visit (from the past 48 hour(s)). No results found.  VITALS: BP 122/84   Pulse (!) 136   Resp (!) 95   Ht 6\' 1"  (1.854 m)   Wt 247 lb (112 kg)   BMI 32.59 kg/m   EXAM: General appearance: alert and no distress Neck: no carotid bruit, no JVD and thyroid not enlarged, symmetric, no tenderness/mass/nodules Lungs: clear to auscultation bilaterally Heart: irregularly irregular rhythm and tachycardic Abdomen: soft, non-tender; bowel sounds normal; no masses,  no organomegaly Extremities: extremities normal, atraumatic, no cyanosis or edema Pulses: 2+ and symmetric Skin: Skin color, texture, turgor normal. No rashes or lesions Neurologic: Grossly normal Psych: Pleasant  EKG: A. fib with RVR, incomplete right bundle branch block at 136-personally reviewed  ASSESSMENT: 1. A. fib with RVR 2. Chest pain resolved- mildly abnormal nuclear stress test, normal LVEF 60-65% on echo/normal wall motion 3. Paroxysmal atrial  flutter - CHADSVASC score of 1 on Xarelto 4. Coronary artery disease status post PCI to the OM with a Xience Alpine DES (2.515 mm) - on low dose Aspirin 5. Residual moderate to severe mid LAD disease 6. Hypertension 7. Dyslipidemia 8. Small perimembranous VSD 9. Fatigue-possibly symptomatic bradycardia  10. Trivial AI 11. 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 Low testosterone  PLAN: 1.   Jose Stone has recurrent A. fib with RVR today.  He seems to be asymptomatic with this.  He is anticoagulated on Xarelto.  In the past he has spontaneously converted with medication adjustments.  I would like to increase his diltiazem further to 240 mg at night and will discontinue his p.m. dose of losartan to allow blood pressure room.  Plan to recheck an EKG early next week.  If he remains out of rhythm, will proceed with a scheduled cardioversion possibly next Wednesday when I am in the hospital.  He seems agreeable this plan.  He understands that he is not to miss any doses of Xarelto before that time and if we do ultimately cardiovert him, he will need to be anticoagulated for a minimum of a month before elective discontinuation of anticoagulation as is necessary for surgery.  [name], MD, [facility], [place] Director of the Advanced Lipid Disorders &  Cardiovascular Risk Reduction Clinic Attending Cardiologist  Direct Dial: [phone]  Fax: [phone]  Website:  [email or web address] 02/22/2020, 1:51 PM

## 2020-02-22 NOTE — Patient Instructions (Addendum)
Medication Instructions:  Your physician has recommended you make the following change in your medication:  -- INCREASE diltiazem to 240mg  daily -- STOP evening dose of valsartan  *If you need a refill on your cardiac medications before your next appointment, please call your pharmacy*  Testing/Procedures: Nurse visit EKG check on Monday April 26 @ 10am  Follow-Up: At [facility], you and your health needs are our priority.  As part of our continuing mission to provide you with exceptional heart care, we have created designated Provider Care Teams.  These Care Teams include your primary Cardiologist (physician) and Advanced Practice Providers (APPs -  Physician Assistants and Nurse Practitioners) who all work together to provide you with the care you need, when you need it.  We recommend signing up for the patient portal called "MyChart".  Sign up information is provided on this After Visit Summary.  MyChart is used to connect with patients for Virtual Visits (Telemedicine).  Patients are able to view lab/test results, encounter notes, upcoming appointments, etc.  Non-urgent messages can be sent to your provider as well.   To learn more about what you can do with MyChart, go to [email or web address].    Your next appointment:   2-3 month(s)  The format for your next appointment:   In Person  Provider:   You may see [name], MD or one of the following Advanced Practice Providers on your designated Care Team:    Almyra Deforest, PA-C  Fabian Sharp, PA-C or   Roby Lofts, [place]    Other Instructions  Please do not miss any doses of Xarelto

## 2020-02-24 ENCOUNTER — Other Ambulatory Visit: Payer: Self-pay | Admitting: *Deleted

## 2020-02-24 DIAGNOSIS — I2510 Atherosclerotic heart disease of native coronary artery without angina pectoris: Secondary | ICD-10-CM | POA: Diagnosis not present

## 2020-02-24 DIAGNOSIS — I4891 Unspecified atrial fibrillation: Secondary | ICD-10-CM | POA: Diagnosis not present

## 2020-02-24 DIAGNOSIS — E785 Hyperlipidemia, unspecified: Secondary | ICD-10-CM | POA: Diagnosis not present

## 2020-02-24 DIAGNOSIS — M25512 Pain in left shoulder: Secondary | ICD-10-CM | POA: Diagnosis not present

## 2020-02-24 DIAGNOSIS — I10 Essential (primary) hypertension: Secondary | ICD-10-CM | POA: Diagnosis not present

## 2020-02-24 MED ORDER — NITROGLYCERIN 0.4 MG SL SUBL: 0.4000 mg | SUBLINGUAL_TABLET | SUBLINGUAL | 5 refills | Status: DC | PRN

## 2020-02-26 ENCOUNTER — Other Ambulatory Visit: Payer: Self-pay | Admitting: Cardiovascular Disease

## 2020-02-27 ENCOUNTER — Ambulatory Visit (INDEPENDENT_AMBULATORY_CARE_PROVIDER_SITE_OTHER): Payer: Medicare Other

## 2020-02-27 ENCOUNTER — Other Ambulatory Visit (HOSPITAL_COMMUNITY)
Admission: RE | Admit: 2020-02-27 | Discharge: 2020-02-27 | Disposition: A | Discharge status: Home / Self Care | Payer: Medicare Other | Source: Ambulatory Visit | Attending: Internal Medicine | Admitting: Internal Medicine

## 2020-02-27 ENCOUNTER — Other Ambulatory Visit: Payer: Self-pay

## 2020-02-27 ENCOUNTER — Other Ambulatory Visit: Payer: Self-pay | Admitting: Internal Medicine

## 2020-02-27 DIAGNOSIS — Z01812 Encounter for preprocedural laboratory examination: Secondary | ICD-10-CM | POA: Insufficient documentation

## 2020-02-27 DIAGNOSIS — M75112 Incomplete rotator cuff tear or rupture of left shoulder, not specified as traumatic: Secondary | ICD-10-CM | POA: Diagnosis not present

## 2020-02-27 DIAGNOSIS — I4891 Unspecified atrial fibrillation: Secondary | ICD-10-CM

## 2020-02-27 DIAGNOSIS — Z20822 Contact with and (suspected) exposure to covid-19: Secondary | ICD-10-CM | POA: Diagnosis not present

## 2020-02-27 LAB — BASIC METABOLIC PANEL
BUN/Creatinine Ratio: 19 (ref 10–24)
BUN: 22 mg/dL (ref 8–27)
CO2: 24 mmol/L (ref 20–29)
Calcium: 9.6 mg/dL (ref 8.6–10.2)
Chloride: 102 mmol/L (ref 96–106)
Creatinine, Ser: 1.15 mg/dL (ref 0.76–1.27)
GFR calc Af Amer: 73 mL/min/{1.73_m2} (ref >59)
GFR calc non Af Amer: 63 mL/min/{1.73_m2} (ref >59)
Glucose: 91 mg/dL (ref 65–99)
Potassium: 4.9 mmol/L (ref 3.5–5.2)
Sodium: 139 mmol/L (ref 134–144)

## 2020-02-27 LAB — CBC
Hematocrit: 44.7 % (ref 37.5–51.0)
Hemoglobin: 14.4 g/dL (ref 13.0–17.7)
MCH: 24.8 pg — ABNORMAL LOW (ref 26.6–33.0)
MCHC: 32.2 g/dL (ref 31.5–35.7)
MCV: 77 fL — ABNORMAL LOW (ref 79–97)
Platelets: 277 10*3/uL (ref 150–450)
RBC: 5.81 x10E6/uL — ABNORMAL HIGH (ref 4.14–5.80)
RDW: 17.1 % — ABNORMAL HIGH (ref 11.6–15.4)
WBC: 8.9 10*3/uL (ref 3.4–10.8)

## 2020-02-27 LAB — SARS CORONAVIRUS 2 (TAT 6-24 HRS): SARS Coronavirus 2: NEGATIVE

## 2020-02-27 NOTE — Progress Notes (Signed)
Performed EKG for pt today per Dr. [name] and Eliezer Lofts, RN. EKG showed Afib with RVR with a HR of 104. Ekg reviewed by DOD Dr. Harrell Gave who confirmed Afib and signed.   EKG will be scanned in to pt chart. Pt is having BMET and CBC done today as well as a COVID test to prepare for DCCV which will be scheduled for 02/29/20 per Dr. [name]. Eliezer Lofts, RN to schedule and call pt with instructions.  Informed pt of all above information. Pt agreeable and will wait to hear from [place], [place].

## 2020-02-27 NOTE — Progress Notes (Signed)
Patient scheduled for cardioversion on Wednesday February 29, 2020 @ 0830 with Dr. Lyman Bishop. He should arrive to [facility] @ 0730. Instructions send via MyChart to patient.   Case ID: [id number]

## 2020-02-27 NOTE — Addendum Note (Signed)
Addended by: Fidel Levy on: 02/27/2020 01:31 PM   Modules accepted: Orders

## 2020-02-28 NOTE — Progress Notes (Signed)
Spoke with patient, has been quarantined and aware of NPO for am.  No missed doses of Xarelto, will be here at 730 with a ride.

## 2020-02-29 ENCOUNTER — Ambulatory Visit (HOSPITAL_COMMUNITY)
Admission: RE | Admit: 2020-02-29 | Discharge: 2020-02-29 | Disposition: A | Discharge status: Home / Self Care | Payer: Medicare Other | Attending: Internal Medicine | Admitting: Internal Medicine

## 2020-02-29 ENCOUNTER — Other Ambulatory Visit: Payer: Self-pay | Admitting: Internal Medicine

## 2020-02-29 ENCOUNTER — Other Ambulatory Visit: Payer: Self-pay | Admitting: Cardiovascular Disease

## 2020-02-29 ENCOUNTER — Other Ambulatory Visit: Payer: Self-pay

## 2020-02-29 ENCOUNTER — Ambulatory Visit (HOSPITAL_COMMUNITY): Payer: Medicare Other | Admitting: Certified Registered Nurse Anesthetist

## 2020-02-29 ENCOUNTER — Telehealth: Payer: Self-pay

## 2020-02-29 ENCOUNTER — Encounter (HOSPITAL_COMMUNITY): Payer: Self-pay | Admitting: Internal Medicine

## 2020-02-29 ENCOUNTER — Encounter (HOSPITAL_COMMUNITY)
Admission: RE | Disposition: A | Discharge status: Home / Self Care | Payer: Self-pay | Source: Home / Self Care | Attending: Internal Medicine

## 2020-02-29 DIAGNOSIS — I4510 Unspecified right bundle-branch block: Secondary | ICD-10-CM | POA: Diagnosis not present

## 2020-02-29 DIAGNOSIS — Z955 Presence of coronary angioplasty implant and graft: Secondary | ICD-10-CM | POA: Insufficient documentation

## 2020-02-29 DIAGNOSIS — M109 Gout, unspecified: Secondary | ICD-10-CM | POA: Insufficient documentation

## 2020-02-29 DIAGNOSIS — I4891 Unspecified atrial fibrillation: Secondary | ICD-10-CM | POA: Diagnosis not present

## 2020-02-29 DIAGNOSIS — E785 Hyperlipidemia, unspecified: Secondary | ICD-10-CM | POA: Diagnosis not present

## 2020-02-29 DIAGNOSIS — Z79899 Other long term (current) drug therapy: Secondary | ICD-10-CM | POA: Insufficient documentation

## 2020-02-29 DIAGNOSIS — Z7982 Long term (current) use of aspirin: Secondary | ICD-10-CM | POA: Diagnosis not present

## 2020-02-29 DIAGNOSIS — Z7901 Long term (current) use of anticoagulants: Secondary | ICD-10-CM | POA: Diagnosis not present

## 2020-02-29 DIAGNOSIS — K219 Gastro-esophageal reflux disease without esophagitis: Secondary | ICD-10-CM | POA: Diagnosis not present

## 2020-02-29 DIAGNOSIS — Q210 Ventricular septal defect: Secondary | ICD-10-CM | POA: Insufficient documentation

## 2020-02-29 DIAGNOSIS — I2510 Atherosclerotic heart disease of native coronary artery without angina pectoris: Secondary | ICD-10-CM | POA: Insufficient documentation

## 2020-02-29 DIAGNOSIS — I10 Essential (primary) hypertension: Secondary | ICD-10-CM | POA: Insufficient documentation

## 2020-02-29 DIAGNOSIS — I480 Paroxysmal atrial fibrillation: Secondary | ICD-10-CM | POA: Insufficient documentation

## 2020-02-29 HISTORY — PX: CARDIOVERSION: SHX1299

## 2020-02-29 SURGERY — CARDIOVERSION
Anesthesia: General

## 2020-02-29 MED ORDER — SODIUM CHLORIDE 0.9 % IV SOLN: INTRAVENOUS | Status: DC | PRN

## 2020-02-29 MED ORDER — SODIUM CHLORIDE 0.9 % IV SOLN
INTRAVENOUS | Status: DC
  Administered 2020-02-29: 500 mL via INTRAVENOUS

## 2020-02-29 MED ORDER — LIDOCAINE 2% (20 MG/ML) 5 ML SYRINGE
INTRAMUSCULAR | Status: DC | PRN
  Administered 2020-02-29: 20 mg via INTRAVENOUS

## 2020-02-29 MED ORDER — PROPOFOL 10 MG/ML IV BOLUS
INTRAVENOUS | Status: DC | PRN
  Administered 2020-02-29: 100 mg via INTRAVENOUS

## 2020-02-29 NOTE — Transfer of Care (Signed)
Immediate Anesthesia Transfer of Care Note  Patient: Jose Stone  Procedure(s) Performed: CARDIOVERSION (N/A )  Patient Location: Endoscopy Unit  Anesthesia Type:General  Level of Consciousness: awake, alert , oriented, patient cooperative and responds to stimulation  Airway & Oxygen Therapy: Patient Spontanous Breathing and Patient connected to nasal cannula oxygen  Post-op Assessment: Report given to RN and Post -op Vital signs reviewed and stable  Post vital signs: Reviewed and stable  Last Vitals:  Vitals Value Taken Time  BP    Temp    Pulse 71 02/29/20 0835  Resp 16 02/29/20 0835  SpO2 99 % 02/29/20 0835  Vitals shown include unvalidated device data.  Last Pain:  Vitals:   02/29/20 0756  TempSrc: Oral         Complications: No apparent anesthesia complications

## 2020-02-29 NOTE — Anesthesia Postprocedure Evaluation (Addendum)
Anesthesia Post Note  Patient: Jose Stone  Procedure(s) Performed: CARDIOVERSION (N/A )     Patient location during evaluation: Endoscopy Anesthesia Type: General Level of consciousness: awake and alert, oriented and patient cooperative Pain management: pain level controlled Vital Signs Assessment: post-procedure vital signs reviewed and stable Respiratory status: spontaneous breathing, nonlabored ventilation and respiratory function stable Cardiovascular status: blood pressure returned to baseline and stable Postop Assessment: no apparent nausea or vomiting Anesthetic complications: no    Last Vitals:  Vitals:   02/29/20 0850 02/29/20 0852  BP: 130/69   Pulse: 69 66  Resp: 16 15  Temp:    SpO2: 97% 99%    Last Pain:  Vitals:   02/29/20 0852  TempSrc:   PainSc: 0-No pain                 Aylanie Cubillos,E. Maurice Fotheringham

## 2020-02-29 NOTE — H&P (Signed)
   INTERVAL PROCEDURE H&P  History and Physical Interval Note:  02/29/2020 7:34 AM  Jose Stone has presented today for their planned procedure. The various methods of treatment have been discussed with the patient and family. After consideration of risks, benefits and other options for treatment, the patient has consented to the procedure.  The patients' outpatient history has been reviewed, patient examined, and no change in status from most recent office note within the past 30 days. I have reviewed the patients' chart and labs and will proceed as planned. Questions were answered to the patient's satisfaction.   [name], MD, [facility], [place] Director of the Advanced Lipid Disorders &  Cardiovascular Risk Reduction Clinic Diplomate of the American Board of Clinical Lipidology Attending Cardiologist  Direct Dial: [phone]  Fax: [phone]  Website:  [name] 02/29/2020, 7:34 AM

## 2020-02-29 NOTE — Telephone Encounter (Signed)
Mr. Wecker cannot electively stop anticoagulation before 03/30/2020 due to cardioversion today. Afterward, could hold Xarelto 2 days prior to surgery. If aspirin needs to be held, would advise holding 7 days prior to surgery.   Dr [name]

## 2020-02-29 NOTE — Anesthesia Preprocedure Evaluation (Addendum)
Anesthesia Evaluation  Patient identified by MRN, date of birth, ID band Patient awake    Reviewed: Allergy & Precautions, NPO status , Patient's Chart, lab work & pertinent test results  History of Anesthesia Complications Negative for: history of anesthetic complications  Airway Mallampati: I  TM Distance: >3 FB Neck ROM: Full    Dental  (+) Caps, Dental Advisory Given   Pulmonary sleep apnea and Continuous Positive Airway Pressure Ventilation ,  02/27/2020 SARS coronavirus NEG   breath sounds clear to auscultation       Cardiovascular hypertension, Pt. on medications (-) angina+ CAD and + Cardiac Stents  + dysrhythmias Atrial Fibrillation  Rhythm:Irregular Rate:Normal  '18 ECHO: LVEF is higher at 60-65%, there is moderate LVH - a restrictive VSD is noted. The ascending aorta measures 4.0 cm and the aortic root at 4.4 cm   Neuro/Psych negative neurological ROS     GI/Hepatic Neg liver ROS, GERD  Medicated and Controlled,  Endo/Other  Morbid obesity  Renal/GU Renal InsufficiencyRenal disease     Musculoskeletal   Abdominal (+) + obese,   Peds  Hematology xarelto   Anesthesia Other Findings   Reproductive/Obstetrics                            Anesthesia Physical Anesthesia Plan  ASA: III  Anesthesia Plan: General   Post-op Pain Management:    Induction: Intravenous  PONV Risk Score and Plan: 2 and Treatment may vary due to age or medical condition  Airway Management Planned: Natural Airway and Mask  Additional Equipment:   Intra-op Plan:   Post-operative Plan:   Informed Consent: I have reviewed the patients History and Physical, chart, labs and discussed the procedure including the risks, benefits and alternatives for the proposed anesthesia with the patient or authorized representative who has indicated his/her understanding and acceptance.     Dental advisory  given  Plan Discussed with:   Anesthesia Plan Comments:        Anesthesia Quick Evaluation

## 2020-02-29 NOTE — CV Procedure (Signed)
   CARDIOVERSION NOTE  Procedure: Electrical Cardioversion Indications:  Atrial Fibrillation  Procedure Details:  Consent: Risks of procedure as well as the alternatives and risks of each were explained to the (patient/caregiver).  Consent for procedure obtained.  Time Out: Verified patient identification, verified procedure, site/side was marked, verified correct patient position, special equipment/implants available, medications/allergies/relevent history reviewed, required imaging and test results available.  Performed  Patient placed on cardiac monitor, pulse oximetry, supplemental oxygen as necessary.  Sedation given: propofol per anesthesia Pacer pads placed anterior and posterior chest.  Cardioverted 2 time(s).  Cardioverted at 150J and 200J biphasic.  Impression: Findings: Post procedure EKG shows: NSR Complications: None Patient did tolerate procedure well.  Plan: 1. Successful DCCV with 2 stacked shocks to NSR. 2. Follow-up with me in the office - do not interrupt anticoagulation for at least 1 month after this cardioversion.  Time Spent Directly with the Patient:  30 minutes   [name], MD, [facility], [place] Director of the [facility] Lipid [facility] &  Cardiovascular Risk Reduction Clinic Diplomate of the American Board of Clinical Lipidology Attending Cardiologist  Direct Dial: [phone]  Fax: [phone]  Website:  [name] 02/29/2020, 8:34 AM

## 2020-02-29 NOTE — Telephone Encounter (Signed)
   [facility] Pre-operative Risk Assessment    Request for surgical clearance:  1. What type of surgery is being performed? Left Arthroscopic Debridement Partial Rotator Cuff Tear, Subacromial Decompression, Biceps Tenotomy  2. When is this surgery scheduled? TBD  3. What type of clearance is required (medical clearance vs. Pharmacy clearance to hold med vs. Both)? Both  4. Are there any medications that need to be held prior to surgery and how long? ASA, Xarelto  5. Practice name and name of physician performing surgery? [facility]-- unknown  6. What is your office phone number [phone]   7.   What is your office fax number [phone]  8.   Anesthesia type (None, local, MAC, general) ? choice   Jose Stone 02/29/2020, 8:25 AM  _________________________________________________________________   (provider comments below)

## 2020-02-29 NOTE — Telephone Encounter (Signed)
Dr [name] a clearance request came in today for this patient to have shoulder surgery- you just cardioverted him today.  Can you give some direction as to when the Xarelto and ASA can be held for his shoulder surgery?  Kerin Ransom PA-C 02/29/2020 8:58 AM

## 2020-02-29 NOTE — Discharge Instructions (Signed)
Electrical Cardioversion Electrical cardioversion is the delivery of a jolt of electricity to restore a normal rhythm to the heart. A rhythm that is too fast or is not regular keeps the heart from pumping well. In this procedure, sticky patches or metal paddles are placed on the chest to deliver electricity to the heart from a device. This procedure may be done in an emergency if:  There is low or no blood pressure as a result of the heart rhythm.  Normal rhythm must be restored as fast as possible to protect the brain and heart from further damage.  It may save a life. This may also be a scheduled procedure for irregular or fast heart rhythms that are not immediately life-threatening. Tell a health care provider about:  Any allergies you have.  All medicines you are taking, including vitamins, herbs, eye drops, creams, and over-the-counter medicines.  Any problems you or family members have had with anesthetic medicines.  Any blood disorders you have.  Any surgeries you have had.  Any medical conditions you have.  Whether you are pregnant or may be pregnant. What are the risks? Generally, this is a safe procedure. However, problems may occur, including:  Allergic reactions to medicines.  A blood clot that breaks free and travels to other parts of your body.  The possible return of an abnormal heart rhythm within hours or days after the procedure.  Your heart stopping (cardiac arrest). This is rare. What happens before the procedure? Medicines  Your health care provider may have you start taking: ? Blood-thinning medicines (anticoagulants) so your blood does not clot as easily. ? Medicines to help stabilize your heart rate and rhythm.  Ask your health care provider about: ? Changing or stopping your regular medicines. This is especially important if you are taking diabetes medicines or blood thinners. ? Taking medicines such as aspirin and ibuprofen. These medicines can  thin your blood. Do not take these medicines unless your health care provider tells you to take them. ? Taking over-the-counter medicines, vitamins, herbs, and supplements. General instructions  Follow instructions from your health care provider about eating or drinking restrictions.  Plan to have someone take you home from the hospital or clinic.  If you will be going home right after the procedure, plan to have someone with you for 24 hours.  Ask your health care provider what steps will be taken to help prevent infection. These may include washing your skin with a germ-killing soap. What happens during the procedure?   An IV will be inserted into one of your veins.  Sticky patches (electrodes) or metal paddles may be placed on your chest.  You will be given a medicine to help you relax (sedative).  An electrical shock will be delivered. The procedure may vary among health care providers and hospitals. What can I expect after the procedure?  Your blood pressure, heart rate, breathing rate, and blood oxygen level will be monitored until you leave the hospital or clinic.  Your heart rhythm will be watched to make sure it does not change.  You may have some redness on the skin where the shocks were given. Follow these instructions at home:  Do not drive for 24 hours if you were given a sedative during your procedure.  Take over-the-counter and prescription medicines only as told by your health care provider.  Ask your health care provider how to check your pulse. Check it often.  Rest for 48 hours after the procedure or   as told by your health care provider.  Avoid or limit your caffeine use as told by your health care provider.  Keep all follow-up visits as told by your health care provider. This is important. Contact a health care provider if:  You feel like your heart is beating too quickly or your pulse is not regular.  You have a serious muscle cramp that does not go  away. Get help right away if:  You have discomfort in your chest.  You are dizzy or you feel faint.  You have trouble breathing or you are short of breath.  Your speech is slurred.  You have trouble moving an arm or leg on one side of your body.  Your fingers or toes turn cold or blue. Summary  Electrical cardioversion is the delivery of a jolt of electricity to restore a normal rhythm to the heart.  This procedure may be done right away in an emergency or may be a scheduled procedure if the condition is not an emergency.  Generally, this is a safe procedure.  After the procedure, check your pulse often as told by your health care provider. This information is not intended to replace advice given to you by your health care provider. Make sure you discuss any questions you have with your health care provider. Document Revised: 05/23/2019 Document Reviewed: 05/23/2019 Elsevier Patient Education  2020 Elsevier Inc.  

## 2020-03-01 NOTE — Telephone Encounter (Signed)
Primary Cardiologist:Kenneth C Hilty, MD  Chart reviewed as part of pre-operative protocol coverage. Because of Jose Stone past medical history and time since last visit, recent cardioversion, we may not hold any anticoagulation before 03/30/2020.  Jose Stone cannot electively stop anticoagulation before 03/30/2020 due to cardioversion today. Afterward, could hold Xarelto 2 days prior to surgery. If aspirin needs to be held, would advise holding 7 days prior to surgery. (Dr. Hilty,02/29/2020).  Jose Sims, NP  03/01/2020, 10:40 AM

## 2020-03-05 ENCOUNTER — Other Ambulatory Visit: Payer: Self-pay | Admitting: Orthopedic Surgery

## 2020-03-07 IMAGING — DX DG CHEST 1V PORT
1 series · 1 of 1 positions shown · non-contrast
Comparison: Chest x-ray dated March 06, 2018.

CLINICAL DATA: Chest pain and tachycardia.

EXAM:
PORTABLE CHEST 1 VIEW

[chest]
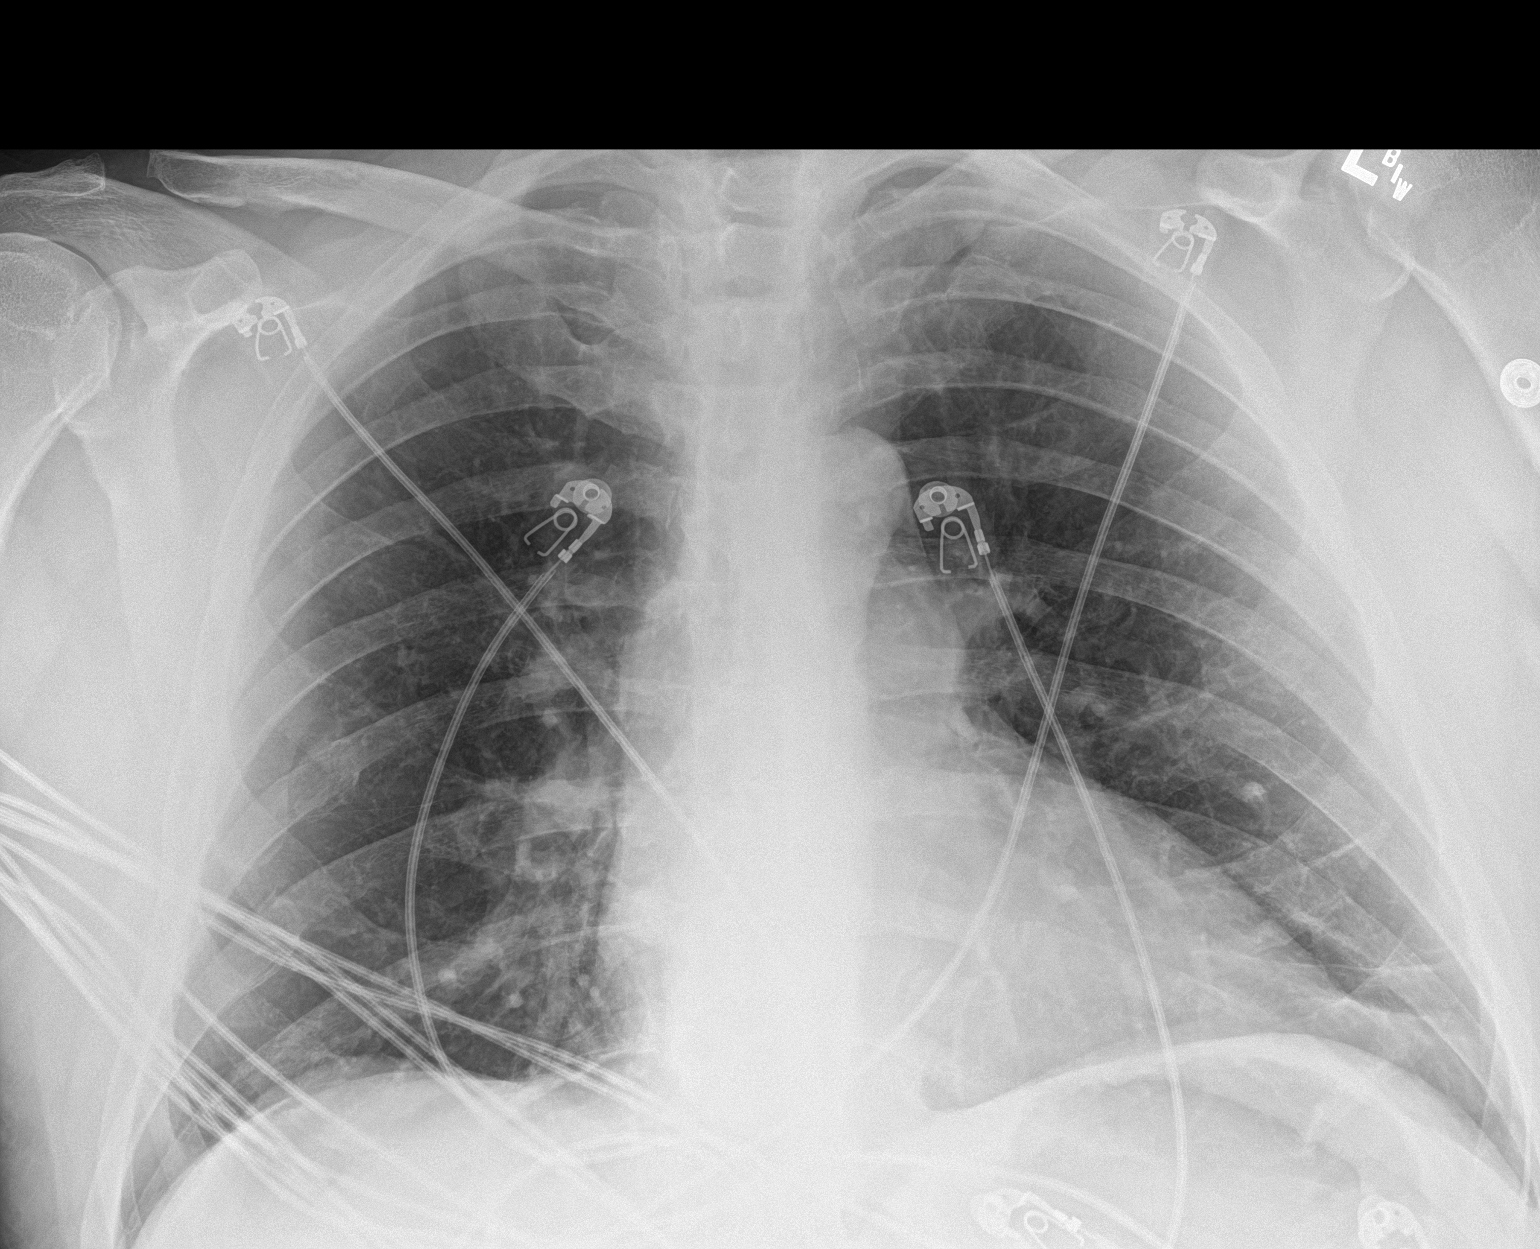

[1 of 1 positions shown; findings below may reference images not displayed]

FINDINGS: The heart size and mediastinal contours are within normal limits.
Normal pulmonary vascularity. Stable subsegmental
scarring/atelectasis at the lung bases. No focal consolidation,
pleural effusion, or pneumothorax. No acute osseous abnormality. Old
right-sided rib fractures again noted.
IMPRESSION: No active disease.

## 2020-04-19 NOTE — Patient Instructions (Addendum)
DUE TO COVID-19 ONLY ONE VISITOR IS ALLOWED TO COME WITH YOU AND STAY IN THE WAITING ROOM ONLY DURING PRE OP AND PROCEDURE DAY OF SURGERY. THE 1 VISITOR MAY VISIT WITH YOU AFTER SURGERY IN YOUR PRIVATE ROOM DURING VISITING HOURS ONLY!  YOU NEED TO HAVE A COVID 19 TEST ON: 04/30/20 @ 11:00 am , THIS TEST MUST BE DONE BEFORE SURGERY, COME  [place], [place] , [address].  ([place]) ONCE YOUR COVID TEST IS COMPLETED, PLEASE BEGIN THE QUARANTINE INSTRUCTIONS AS OUTLINED IN YOUR HANDOUT.                Jose Stone    Your procedure is scheduled on: 05/03/20   Report to [facility] Main  Entrance   Report to short stay at: 5:30 AM     Call this number if you have problems the morning of surgery [phone]    Remember:   NO SOLID FOOD AFTER MIDNIGHT THE NIGHT PRIOR TO SURGERY. NOTHING BY MOUTH EXCEPT CLEAR LIQUIDS UNTIL: 4:30 am . PLEASE FINISH ENSURE DRINK PER SURGEON ORDER  WHICH NEEDS TO BE COMPLETED AT: 4:30 am .   CLEAR LIQUID DIET   Foods Allowed                                                                     Foods Excluded  Coffee and tea, regular and decaf                             liquids that you cannot  Plain Jell-O any favor except red or purple                                           see through such as: Fruit ices (not with fruit pulp)                                     milk, soups, orange juice  Iced Popsicles                                    All solid food Carbonated beverages, regular and diet                                    Cranberry, grape and apple juices Sports drinks like Gatorade Lightly seasoned clear broth or consume(fat free) Sugar, honey syrup  Sample Menu Breakfast                                Lunch                                     Supper Cranberry juice  Beef broth                            Chicken broth Jell-O                                     Grape juice                            Apple juice Coffee or tea                        Jell-O                                      Popsicle                                                Coffee or tea                        Coffee or tea  _____________________________________________________________________  BRUSH YOUR TEETH MORNING OF SURGERY AND RINSE YOUR MOUTH OUT, NO CHEWING GUM CANDY OR MINTS.     Take these medicines the morning of surgery with A SIP OF WATER: allopurinol,diltiazem,loratadine,omeprazole,paroxetine.Colcrys as needed.                                You may not have any metal on your body including hair pins and              piercings  Do not wear jewelry,lotions, powders or perfumes, deodorant             Men may shave face and neck.   Do not bring valuables to the hospital. [place].  Contacts, dentures or bridgework may not be worn into surgery.  Leave suitcase in the car. After surgery it may be brought to your room.     Patients discharged the day of surgery will not be allowed to drive home. IF YOU ARE HAVING SURGERY AND GOING HOME THE SAME DAY, YOU MUST HAVE AN ADULT TO DRIVE YOU HOME AND BE WITH YOU FOR 24 HOURS. YOU MAY GO HOME BY TAXI OR UBER OR ORTHERWISE, BUT AN ADULT MUST ACCOMPANY YOU HOME AND STAY WITH YOU FOR 24 HOURS.  Name and phone number of your driver:  Special Instructions: N/A              Please read over the following fact sheets you were given: _____________________________________________________________________  [facility] - Preparing for Surgery Before surgery, you can play an important role.  Because skin is not sterile, your skin needs to be as free of germs as possible.  You can reduce the number of germs on your skin by washing with CHG (chlorahexidine gluconate) soap before surgery.  CHG is an antiseptic cleaner which kills germs and bonds with the skin to continue killing germs even after washing. Please DO NOT  use if you have an allergy to CHG or antibacterial soaps.  If your skin becomes reddened/irritated stop using the CHG and inform your nurse when you arrive at Short Stay. Do not shave (including legs and underarms) for at least 48 hours prior to the first CHG shower.  You may shave your face/neck. Please follow these instructions carefully:  1.  Shower with CHG Soap the night before surgery and the  morning of Surgery.  2.  If you choose to wash your hair, wash your hair first as usual with your  normal  shampoo.  3.  After you shampoo, rinse your hair and body thoroughly to remove the  shampoo.                           4.  Use CHG as you would any other liquid soap.  You can apply chg directly  to the skin and wash                       Gently with a scrungie or clean washcloth.  5.  Apply the CHG Soap to your body ONLY FROM THE NECK DOWN.   Do not use on face/ open                           Wound or open sores. Avoid contact with eyes, ears mouth and genitals (private parts).                       Wash face,  Genitals (private parts) with your normal soap.             6.  Wash thoroughly, paying special attention to the area where your surgery  will be performed.  7.  Thoroughly rinse your body with warm water from the neck down.  8.  DO NOT shower/wash with your normal soap after using and rinsing off  the CHG Soap.                9.  Pat yourself dry with a clean towel.            10.  Wear clean pajamas.            11.  Place clean sheets on your bed the night of your first shower and do not  sleep with pets. Day of Surgery : Do not apply any lotions/deodorants the morning of surgery.  Please wear clean clothes to the hospital/surgery center.  FAILURE TO FOLLOW THESE INSTRUCTIONS MAY RESULT IN THE CANCELLATION OF YOUR SURGERY PATIENT SIGNATURE_________________________________  NURSE  SIGNATURE__________________________________  ________________________________________________________________________   Jose Stone  An incentive spirometer is a tool that can help keep your lungs clear and active. This tool measures how well you are filling your lungs with each breath. Taking long deep breaths may help reverse or decrease the chance of developing breathing (pulmonary) problems (especially infection) following:  A long period of time when you are unable to move or be active. BEFORE THE PROCEDURE   If the spirometer includes an indicator to show your best effort, your nurse or respiratory therapist will set it to a desired goal.  If possible, sit up straight or lean slightly forward. Try not to slouch.  Hold the incentive spirometer in an upright position. INSTRUCTIONS FOR USE  1. Sit on the edge of your bed if possible, or  sit up as far as you can in bed or on a chair. 2. Hold the incentive spirometer in an upright position. 3. Breathe out normally. 4. Place the mouthpiece in your mouth and seal your lips tightly around it. 5. Breathe in slowly and as deeply as possible, raising the piston or the ball toward the top of the column. 6. Hold your breath for 3-5 seconds or for as long as possible. Allow the piston or ball to fall to the bottom of the column. 7. Remove the mouthpiece from your mouth and breathe out normally. 8. Rest for a few seconds and repeat Steps 1 through 7 at least 10 times every 1-2 hours when you are awake. Take your time and take a few normal breaths between deep breaths. 9. The spirometer may include an indicator to show your best effort. Use the indicator as a goal to work toward during each repetition. 10. After each set of 10 deep breaths, practice coughing to be sure your lungs are clear. If you have an incision (the cut made at the time of surgery), support your incision when coughing by placing a pillow or rolled up towels firmly  against it. Once you are able to get out of bed, walk around indoors and cough well. You may stop using the incentive spirometer when instructed by your caregiver.  RISKS AND COMPLICATIONS  Take your time so you do not get dizzy or light-headed.  If you are in pain, you may need to take or ask for pain medication before doing incentive spirometry. It is harder to take a deep breath if you are having pain. AFTER USE  Rest and breathe slowly and easily.  It can be helpful to keep track of a log of your progress. Your caregiver can provide you with a simple table to help with this. If you are using the spirometer at home, follow these instructions: [place] IF:   You are having difficultly using the spirometer.  You have trouble using the spirometer as often as instructed.  Your pain medication is not giving enough relief while using the spirometer.  You develop fever of 100.5 F (38.1 C) or higher. SEEK IMMEDIATE MEDICAL CARE IF:   You cough up bloody sputum that had not been present before.  You develop fever of 102 F (38.9 C) or greater.  You develop worsening pain at or near the incision site. MAKE SURE YOU:   Understand these instructions.  Will watch your condition.  Will get help right away if you are not doing well or get worse. Document Released: 03/02/2007 Document Revised: 01/12/2012 Document Reviewed: 05/03/2007 [facility] Patient Information 2014 [place], [place].   ________________________________________________________________________

## 2020-04-20 ENCOUNTER — Encounter (HOSPITAL_COMMUNITY)
Admission: RE | Admit: 2020-04-20 | Discharge: 2020-04-20 | Disposition: A | Discharge status: Home / Self Care | Payer: Medicare Other | Source: Ambulatory Visit | Attending: Orthopedic Surgery | Admitting: Orthopedic Surgery

## 2020-04-20 ENCOUNTER — Encounter (HOSPITAL_COMMUNITY): Payer: Self-pay

## 2020-04-20 ENCOUNTER — Other Ambulatory Visit: Payer: Self-pay

## 2020-04-20 DIAGNOSIS — Z01812 Encounter for preprocedural laboratory examination: Secondary | ICD-10-CM | POA: Diagnosis not present

## 2020-04-20 HISTORY — DX: Other specified congenital malformations of peripheral vascular system: Q27.8

## 2020-04-20 HISTORY — DX: Other specified congenital malformations of peripheral vascular system: Q39.9

## 2020-04-20 NOTE — Progress Notes (Signed)
COVID Vaccine Completed:yes Date COVID Vaccine completed:01/05/20 COVID vaccine manufacturer: *[employer]    [employer]   PCP - Dr. Kathyrn Lass Cardiologist - Dr. Lyman Bishop. : Clearance. 02/29/20. EPIC  Chest x-ray -  EKG - 02/29/20. EPIC Stress Test -  ECHO - 2008 Cardiac Cath -  Cardioversion: 02/29/20. EPIC Sleep Study -  CPAP -   Fasting Blood Sugar -  Checks Blood Sugar _____ times a day  Blood Thinner Instructions:Aspirin last dose will be: 6/24. Xarelto: last dose 6/29. Aspirin Instructions:Dr. Hilty Last Dose:  Anesthesia review: Hx: HTN,Afib,Aflutter,CAD.Pt. verbalized feeling well,very active,denied any SOB.  Patient denies shortness of breath, fever, cough and chest pain at PAT appointment   Patient verbalized understanding of instructions that were given to them at the PAT appointment. Patient was also instructed that they will need to review over the PAT instructions again at home before surgery.

## 2020-04-24 ENCOUNTER — Encounter (HOSPITAL_COMMUNITY)
Admission: RE | Admit: 2020-04-24 | Discharge: 2020-04-24 | Disposition: A | Discharge status: Home / Self Care | Payer: Medicare Other | Source: Ambulatory Visit | Attending: Orthopedic Surgery | Admitting: Orthopedic Surgery

## 2020-04-24 ENCOUNTER — Other Ambulatory Visit: Payer: Self-pay

## 2020-04-24 DIAGNOSIS — Z8673 Personal history of transient ischemic attack (TIA), and cerebral infarction without residual deficits: Secondary | ICD-10-CM | POA: Insufficient documentation

## 2020-04-24 DIAGNOSIS — I2510 Atherosclerotic heart disease of native coronary artery without angina pectoris: Secondary | ICD-10-CM | POA: Insufficient documentation

## 2020-04-24 DIAGNOSIS — K219 Gastro-esophageal reflux disease without esophagitis: Secondary | ICD-10-CM | POA: Insufficient documentation

## 2020-04-24 DIAGNOSIS — Z7901 Long term (current) use of anticoagulants: Secondary | ICD-10-CM | POA: Diagnosis not present

## 2020-04-24 DIAGNOSIS — M75112 Incomplete rotator cuff tear or rupture of left shoulder, not specified as traumatic: Secondary | ICD-10-CM | POA: Diagnosis not present

## 2020-04-24 DIAGNOSIS — Z01818 Encounter for other preprocedural examination: Secondary | ICD-10-CM | POA: Diagnosis present

## 2020-04-24 DIAGNOSIS — Z955 Presence of coronary angioplasty implant and graft: Secondary | ICD-10-CM | POA: Insufficient documentation

## 2020-04-24 DIAGNOSIS — I10 Essential (primary) hypertension: Secondary | ICD-10-CM | POA: Diagnosis not present

## 2020-04-24 DIAGNOSIS — E785 Hyperlipidemia, unspecified: Secondary | ICD-10-CM | POA: Diagnosis not present

## 2020-04-24 DIAGNOSIS — Z79899 Other long term (current) drug therapy: Secondary | ICD-10-CM | POA: Insufficient documentation

## 2020-04-24 DIAGNOSIS — Z85820 Personal history of malignant melanoma of skin: Secondary | ICD-10-CM | POA: Insufficient documentation

## 2020-04-24 DIAGNOSIS — I4891 Unspecified atrial fibrillation: Secondary | ICD-10-CM | POA: Diagnosis not present

## 2020-04-24 DIAGNOSIS — Z7982 Long term (current) use of aspirin: Secondary | ICD-10-CM | POA: Diagnosis not present

## 2020-04-24 LAB — CBC
HCT: 44.3 % (ref 39.0–52.0)
Hemoglobin: 13.9 g/dL (ref 13.0–17.0)
MCH: 25.6 pg — ABNORMAL LOW (ref 26.0–34.0)
MCHC: 31.4 g/dL (ref 30.0–36.0)
MCV: 81.4 fL (ref 80.0–100.0)
Platelets: 220 10*3/uL (ref 150–400)
RBC: 5.44 MIL/uL (ref 4.22–5.81)
RDW: 19.7 % — ABNORMAL HIGH (ref 11.5–15.5)
WBC: 7.3 10*3/uL (ref 4.0–10.5)
nRBC: 0 % (ref 0.0–0.2)

## 2020-04-24 LAB — BASIC METABOLIC PANEL WITH GFR
Anion gap: 10 (ref 5–15)
BUN: 29 mg/dL — ABNORMAL HIGH (ref 8–23)
CO2: 25 mmol/L (ref 22–32)
Calcium: 9.4 mg/dL (ref 8.9–10.3)
Chloride: 107 mmol/L (ref 98–111)
Creatinine, Ser: 1.12 mg/dL (ref 0.61–1.24)
GFR calc Af Amer: >60 mL/min
GFR calc non Af Amer: >60 mL/min
Glucose, Bld: 94 mg/dL (ref 70–99)
Potassium: 4.9 mmol/L (ref 3.5–5.1)
Sodium: 142 mmol/L (ref 135–145)

## 2020-04-25 NOTE — Progress Notes (Signed)
Anesthesia Chart Review   Case: [id number] Date/Time: 05/03/20 0715   Procedure: SHOULDER ARTHROSCOPY DEBRIDEMENT PARTIAL ROTATOR CUFF REPAIR, SUBACROMIAL DECOMPRESSION, BICEPS TENOTOMY (Left )   Anesthesia type: Choice   Pre-op diagnosis: LEFT SHOULDER PARTIAL ROTAOR CUFF TEAR, BICEPS TENDENOPATHY   Location: [place] / [facility]   Surgeons: Tania Ade, MD      DISCUSSION:74 y.o. never smoker with h/o HTN, GERD, HLD, atrial fibrillation, Stroke, CAD (stent OM 2015), left shoulder rotator cuff tear scheduled for above procedure 05/03/2020 with Dr. Tania Ade.   S/p cardioversion 02/29/2020.  Per cardiology pre-operative risk assessment 03/01/2020, "Chart reviewed as part of pre-operative protocol coverage. Because of TEDDIE MEHTA past medical history and time since last visit, recent cardioversion, we may not hold any anticoagulation before 03/30/2020. Mr. Umstead cannot electively stop anticoagulation before 03/30/2020 due to cardioversion today. Afterward, could hold Xarelto 2 days prior to surgery. If aspirin needs to be held, would advise holding 7 days prior to surgery. (Dr. Hilty,02/29/2020)."  Anticipate pt can proceed with planned procedure barring acute status change.   VS: BP (!) 152/76 (BP Location: Left Arm)   Pulse 60   Temp 36.8 C (Oral)   Resp 17   Ht 6\' 1"  (1.854 m)   Wt 110.8 kg   SpO2 97%   BMI 32.22 kg/m   PROVIDERS: Kathyrn Lass, MD is PCP   Lyman Bishop, MD is Cardiologist  LABS: Labs reviewed: Acceptable for surgery. (all labs ordered are listed, but only abnormal results are displayed)  Labs Reviewed  BASIC METABOLIC PANEL - Abnormal; Notable for the following components:      Result Value   BUN 29 (*)    All other components within normal limits  CBC - Abnormal; Notable for the following components:   MCH 25.6 (*)    RDW 19.7 (*)    All other components within normal limits     IMAGES:   EKG: 02/29/2020 Rate 70 bpm  Normal sinus rhythm   Incomplete RBBB No significant change since last tracing   CV: Echo 10/01/2017 Study Conclusions   - Left ventricle: The cavity size was normal. Wall thickness was  increased in a pattern of moderate LVH. Systolic function was  normal. The estimated ejection fraction was in the range of 60%  to 65%. Wall motion was normal; there were no regional wall  motion abnormalities. Doppler parameters are consistent with  abnormal left ventricular relaxation (grade 1 diastolic  dysfunction). The E/e&' ratio is between 8-15, suggesting  indeterminate LV filling pressure.  - Ventricular septum: Restrictive VSD noted - mean gradient 47  mmHg, peak gradient 77 mmHg.  - Aortic valve: Mildly calcified leaflets. There was no stenosis.  There was no regurgitation.  - Aorta: Aortic root dimension: 44 mm (ED). Ascending aortic  diameter: 40 mm (S).  - Ascending aorta: The ascending aorta and root were mildly  dilated.  - Left atrium: The atrium was normal in size.  - Inferior vena cava: The vessel was normal in size. The  respirophasic diameter changes were in the normal range (>= 50%),  consistent with normal central venous pressure.   Impressions:   - Compared to a prior study in 2015, the LVEF is higher at 60-65%,  there is moderate LVH - a restrictive VSD is noted. The ascending  aorta measures 4.0 cm and the aortic root at 4.4 cm, normal LA  size.     Past Medical History:  Diagnosis Date  .  Atrial fibrillation ([place])   . CAD (coronary artery disease)   . Cancer ([place])    melanoma 1987  . Complication of anesthesia    Hypotensive with general anesth.  . Dysrhythmia    Afib,Aflutter  . GERD (gastroesophageal reflux disease)   . Hyperlipidemia    VSD  . Hypertension   . Stroke [facility]    micro cva  . 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 CARDIAC  CATHETERIZATION    . CARDIOVERSION N/A 02/29/2020   Procedure: CARDIOVERSION;  Surgeon: [name], MD;  Location: [facility] ENDOSCOPY;  Service: Cardiovascular;  Laterality: N/A;  . DISTAL INTERPHALANGEAL JOINT FUSION Left 01/19/2019   Procedure: HALLUX INTERPHANGEAL JOINT FUSION LEFT FOOT;  Surgeon: Trula Slade, DPM;  Location: [place];  Service: Podiatry;  Laterality: Left;  LEFT BLOCK  . hipospedious    . LEFT HEART CATHETERIZATION WITH CORONARY ANGIOGRAM N/A 08/14/2014   Procedure: LEFT HEART CATHETERIZATION WITH CORONARY ANGIOGRAM;  Surgeon: Birdie Riddle, MD;  Location: [place] CATH LAB;  Service: Cardiovascular;  Laterality: N/A;  . MASS EXCISION Left 01/19/2019   Procedure: EXCISION BENIGN LESION 2.OCM LEFT FOOT;  Surgeon: Trula Slade, DPM;  Location: [place];  Service: Podiatry;  Laterality: Left;  . SHOULDER SURGERY    . TONSILLECTOMY  1955    MEDICATIONS: . acetaminophen (TYLENOL) 500 MG tablet  . allopurinol (ZYLOPRIM) 100 MG tablet  . aspirin 81 MG tablet  . atorvastatin (LIPITOR) 80 MG tablet  . COLCRYS 0.6 MG tablet  . diltiazem (CARDIZEM CD) 120 MG 24 hr capsule  . loratadine (CLARITIN) 10 MG tablet  . Multiple Vitamins-Minerals (CENTRUM SILVER ULTRA MENS PO)  . nitroGLYCERIN (NITROSTAT) 0.4 MG SL tablet  . Omega-3 Fat Ac-Cholecalciferol (DRY EYE OMEGA BENEFITS/VIT D-3 PO)  . omeprazole (PRILOSEC) 40 MG capsule  . PARoxetine (PAXIL) 20 MG tablet  . Polyethyl Glycol-Propyl Glycol (SYSTANE OP)  . testosterone cypionate (DEPOTESTOSTERONE CYPIONATE) 200 MG/ML injection  . valsartan (DIOVAN) 160 MG tablet  . XARELTO 20 MG TABS tablet   No current facility-administered medications for this encounter.     Maia Plan [facility] Pre-Surgical Testing [phone] 04/25/20  4:05 PM

## 2020-04-25 NOTE — Anesthesia Preprocedure Evaluation (Addendum)
Anesthesia Evaluation  Patient identified by MRN, date of birth, ID band Patient awake    Reviewed: Allergy & Precautions, NPO status , Patient's Chart, lab work & pertinent test results  History of Anesthesia Complications Negative for: history of anesthetic complications  Airway Mallampati: III  TM Distance: >3 FB Neck ROM: Full    Dental no notable dental hx.    Pulmonary sleep apnea and Continuous Positive Airway Pressure Ventilation ,    Pulmonary exam normal        Cardiovascular hypertension, Pt. on medications + CAD and + Cardiac Stents (2015)  Normal cardiovascular exam+ dysrhythmias (on Xarelto) Atrial Fibrillation   Echo 10/01/2017: moderate LVH, EF 45-36%, grade 1 diastolicdysfunction, restrictive VSD noted - mean gradient 71mmHg, peak gradient 77 mmHg   Incomplete RBBB   Neuro/Psych CVA negative psych ROS   GI/Hepatic Neg liver ROS, GERD  Medicated and Controlled,  Endo/Other  negative endocrine ROS  Renal/GU negative Renal ROS  negative genitourinary   Musculoskeletal left shoulder rotator cuff tear   Abdominal   Peds  Hematology negative hematology ROS (+)   Anesthesia Other Findings Day of surgery medications reviewed with patient.  Reproductive/Obstetrics negative OB ROS                          Anesthesia Physical Anesthesia Plan  ASA: III  Anesthesia Plan: General   Post-op Pain Management: GA combined w/ Regional for post-op pain   Induction: Intravenous  PONV Risk Score and Plan: 2 and Treatment may vary due to age or medical condition, Ondansetron and Dexamethasone  Airway Management Planned: Oral ETT  Additional Equipment: None  Intra-op Plan:   Post-operative Plan: Extubation in OR  Informed Consent: I have reviewed the patients History and Physical, chart, labs and discussed the procedure including the risks, benefits and alternatives for the proposed  anesthesia with the patient or authorized representative who has indicated his/her understanding and acceptance.     Dental advisory given  Plan Discussed with: CRNA  Anesthesia Plan Comments: (See PAT note 04/24/2020, Konrad Felix, PA-C)      Anesthesia Quick Evaluation

## 2020-04-30 ENCOUNTER — Other Ambulatory Visit (HOSPITAL_COMMUNITY)
Admission: RE | Admit: 2020-04-30 | Discharge: 2020-04-30 | Disposition: A | Discharge status: Home / Self Care | Payer: Medicare Other | Source: Ambulatory Visit | Attending: Orthopedic Surgery | Admitting: Orthopedic Surgery

## 2020-04-30 DIAGNOSIS — Z20822 Contact with and (suspected) exposure to covid-19: Secondary | ICD-10-CM | POA: Insufficient documentation

## 2020-04-30 DIAGNOSIS — Z01812 Encounter for preprocedural laboratory examination: Secondary | ICD-10-CM | POA: Diagnosis present

## 2020-04-30 LAB — SARS CORONAVIRUS 2 (TAT 6-24 HRS): SARS Coronavirus 2: NEGATIVE

## 2020-05-02 ENCOUNTER — Encounter (HOSPITAL_COMMUNITY): Payer: Self-pay | Admitting: Orthopedic Surgery

## 2020-05-02 NOTE — Progress Notes (Signed)
Pt. Has been notified about the time change for surgery.Pt. will drink ensure at 6:50 am.Also, pt. Will arrive at 7:20 am.

## 2020-05-03 ENCOUNTER — Ambulatory Visit (HOSPITAL_COMMUNITY)
Admission: RE | Admit: 2020-05-03 | Discharge: 2020-05-03 | Disposition: A | Discharge status: Home / Self Care | Payer: Medicare Other | Source: Ambulatory Visit | Attending: Orthopedic Surgery | Admitting: Orthopedic Surgery

## 2020-05-03 ENCOUNTER — Ambulatory Visit (HOSPITAL_COMMUNITY): Payer: Medicare Other | Admitting: Anesthesiology

## 2020-05-03 ENCOUNTER — Encounter (HOSPITAL_COMMUNITY): Payer: Self-pay | Admitting: Orthopedic Surgery

## 2020-05-03 ENCOUNTER — Ambulatory Visit (HOSPITAL_COMMUNITY): Payer: Medicare Other | Admitting: Physician Assistant

## 2020-05-03 ENCOUNTER — Encounter (HOSPITAL_COMMUNITY)
Admission: RE | Disposition: A | Discharge status: Home / Self Care | Payer: Self-pay | Source: Ambulatory Visit | Attending: Orthopedic Surgery

## 2020-05-03 DIAGNOSIS — Z7901 Long term (current) use of anticoagulants: Secondary | ICD-10-CM | POA: Insufficient documentation

## 2020-05-03 DIAGNOSIS — I4891 Unspecified atrial fibrillation: Secondary | ICD-10-CM | POA: Insufficient documentation

## 2020-05-03 DIAGNOSIS — M67814 Other specified disorders of tendon, left shoulder: Secondary | ICD-10-CM | POA: Insufficient documentation

## 2020-05-03 DIAGNOSIS — G4730 Sleep apnea, unspecified: Secondary | ICD-10-CM | POA: Diagnosis not present

## 2020-05-03 DIAGNOSIS — Z955 Presence of coronary angioplasty implant and graft: Secondary | ICD-10-CM | POA: Diagnosis not present

## 2020-05-03 DIAGNOSIS — M75112 Incomplete rotator cuff tear or rupture of left shoulder, not specified as traumatic: Secondary | ICD-10-CM | POA: Diagnosis present

## 2020-05-03 DIAGNOSIS — K219 Gastro-esophageal reflux disease without esophagitis: Secondary | ICD-10-CM | POA: Insufficient documentation

## 2020-05-03 DIAGNOSIS — Z85820 Personal history of malignant melanoma of skin: Secondary | ICD-10-CM | POA: Insufficient documentation

## 2020-05-03 DIAGNOSIS — I2510 Atherosclerotic heart disease of native coronary artery without angina pectoris: Secondary | ICD-10-CM | POA: Diagnosis not present

## 2020-05-03 DIAGNOSIS — Z7982 Long term (current) use of aspirin: Secondary | ICD-10-CM | POA: Diagnosis not present

## 2020-05-03 DIAGNOSIS — I10 Essential (primary) hypertension: Secondary | ICD-10-CM | POA: Insufficient documentation

## 2020-05-03 DIAGNOSIS — Z79899 Other long term (current) drug therapy: Secondary | ICD-10-CM | POA: Insufficient documentation

## 2020-05-03 DIAGNOSIS — Z791 Long term (current) use of non-steroidal anti-inflammatories (NSAID): Secondary | ICD-10-CM | POA: Diagnosis not present

## 2020-05-03 DIAGNOSIS — Z79890 Hormone replacement therapy (postmenopausal): Secondary | ICD-10-CM | POA: Insufficient documentation

## 2020-05-03 DIAGNOSIS — Z8673 Personal history of transient ischemic attack (TIA), and cerebral infarction without residual deficits: Secondary | ICD-10-CM | POA: Insufficient documentation

## 2020-05-03 DIAGNOSIS — M25312 Other instability, left shoulder: Secondary | ICD-10-CM | POA: Diagnosis not present

## 2020-05-03 DIAGNOSIS — M7542 Impingement syndrome of left shoulder: Secondary | ICD-10-CM | POA: Diagnosis not present

## 2020-05-03 DIAGNOSIS — E785 Hyperlipidemia, unspecified: Secondary | ICD-10-CM | POA: Diagnosis not present

## 2020-05-03 HISTORY — PX: SHOULDER ARTHROSCOPY WITH SUBACROMIAL DECOMPRESSION: SHX5684

## 2020-05-03 SURGERY — SHOULDER ARTHROSCOPY WITH SUBACROMIAL DECOMPRESSION
Anesthesia: General | Site: Shoulder | Laterality: Left

## 2020-05-03 MED ORDER — 0.9 % SODIUM CHLORIDE (POUR BTL) OPTIME
TOPICAL | Status: DC | PRN
  Administered 2020-05-03: 1000 mL

## 2020-05-03 MED ORDER — CHLORHEXIDINE GLUCONATE 0.12 % MT SOLN
15.0000 mL | Freq: Once | OROMUCOSAL | Status: AC
  Administered 2020-05-03: 15 mL via OROMUCOSAL

## 2020-05-03 MED ORDER — PROMETHAZINE HCL 25 MG/ML IJ SOLN: 6.2500 mg | INTRAMUSCULAR | Status: DC | PRN

## 2020-05-03 MED ORDER — ORAL CARE MOUTH RINSE: 15.0000 mL | Freq: Once | OROMUCOSAL | Status: AC

## 2020-05-03 MED ORDER — ACETAMINOPHEN 500 MG PO TABS
1000.0000 mg | ORAL_TABLET | Freq: Once | ORAL | Status: AC
  Administered 2020-05-03: 1000 mg via ORAL
  Filled 2020-05-03: qty 2

## 2020-05-03 MED ORDER — LACTATED RINGERS IV SOLN: INTRAVENOUS | Status: DC

## 2020-05-03 MED ORDER — FENTANYL CITRATE (PF) 100 MCG/2ML IJ SOLN: INTRAMUSCULAR | Status: DC | PRN

## 2020-05-03 MED ORDER — BUPIVACAINE-EPINEPHRINE (PF) 0.5% -1:200000 IJ SOLN
INTRAMUSCULAR | Status: DC | PRN
Start: 2020-05-03 — End: 2020-05-03
  Administered 2020-05-03: 15 mL via PERINEURAL

## 2020-05-03 MED ORDER — FENTANYL CITRATE (PF) 100 MCG/2ML IJ SOLN
50.0000 ug | INTRAMUSCULAR | Status: DC
  Administered 2020-05-03: 50 ug via INTRAVENOUS

## 2020-05-03 MED ORDER — BUPIVACAINE LIPOSOME 1.3 % IJ SUSP
INTRAMUSCULAR | Status: DC | PRN
Start: 2020-05-03 — End: 2020-05-03
  Administered 2020-05-03: 10 mL via PERINEURAL

## 2020-05-03 MED ORDER — LIDOCAINE 2% (20 MG/ML) 5 ML SYRINGE
INTRAMUSCULAR | Status: DC | PRN
  Administered 2020-05-03: 100 mg via INTRAVENOUS

## 2020-05-03 MED ORDER — DEXAMETHASONE SODIUM PHOSPHATE 10 MG/ML IJ SOLN
INTRAMUSCULAR | Status: DC | PRN
  Administered 2020-05-03: 8 mg via INTRAVENOUS

## 2020-05-03 MED ORDER — ONDANSETRON HCL 4 MG/2ML IJ SOLN
INTRAMUSCULAR | Status: DC | PRN
  Administered 2020-05-03: 4 mg via INTRAVENOUS

## 2020-05-03 MED ORDER — CEFAZOLIN SODIUM-DEXTROSE 2-4 GM/100ML-% IV SOLN
2.0000 g | INTRAVENOUS | Status: AC
  Administered 2020-05-03: 2 g via INTRAVENOUS
  Filled 2020-05-03: qty 100

## 2020-05-03 MED ORDER — PROPOFOL 10 MG/ML IV BOLUS
INTRAVENOUS | Status: DC | PRN
  Administered 2020-05-03: 180 mg via INTRAVENOUS

## 2020-05-03 MED ORDER — HYDROCODONE-ACETAMINOPHEN 5-325 MG PO TABS: 1.0000 | ORAL_TABLET | ORAL | 0 refills | Status: DC | PRN

## 2020-05-03 MED ORDER — FENTANYL CITRATE (PF) 100 MCG/2ML IJ SOLN: 25.0000 ug | INTRAMUSCULAR | Status: DC | PRN

## 2020-05-03 MED ORDER — ROCURONIUM BROMIDE 10 MG/ML (PF) SYRINGE
PREFILLED_SYRINGE | INTRAVENOUS | Status: DC | PRN
  Administered 2020-05-03: 60 mg via INTRAVENOUS

## 2020-05-03 MED ORDER — SUGAMMADEX SODIUM 200 MG/2ML IV SOLN
INTRAVENOUS | Status: DC | PRN
  Administered 2020-05-03: 250 mg via INTRAVENOUS

## 2020-05-03 MED ORDER — LACTATED RINGERS IV SOLN
INTRAVENOUS | Status: DC | PRN
  Administered 2020-05-03: 6000 mL

## 2020-05-03 MED ORDER — PHENYLEPHRINE HCL-NACL 10-0.9 MG/250ML-% IV SOLN
INTRAVENOUS | Status: DC | PRN
  Administered 2020-05-03: 25 ug/min via INTRAVENOUS

## 2020-05-03 MED ORDER — FENTANYL CITRATE (PF) 100 MCG/2ML IJ SOLN
INTRAMUSCULAR | Status: AC
  Filled 2020-05-03: qty 2

## 2020-05-03 MED ORDER — MIDAZOLAM HCL 2 MG/2ML IJ SOLN
1.0000 mg | INTRAMUSCULAR | Status: DC
  Administered 2020-05-03: 1 mg via INTRAVENOUS

## 2020-05-03 MED ORDER — TIZANIDINE HCL 4 MG PO TABS
4.0000 mg | ORAL_TABLET | Freq: Three times a day (TID) | ORAL | 1 refills | Status: DC | PRN
Start: 2020-05-03 — End: 2020-06-28

## 2020-05-03 MED ORDER — MIDAZOLAM HCL 2 MG/2ML IJ SOLN
INTRAMUSCULAR | Status: AC
  Filled 2020-05-03: qty 2

## 2020-05-03 SURGICAL SUPPLY — 70 items
AID PSTN UNV HD RSTRNT DISP (MISCELLANEOUS)
ANCH SUT SWLK 19.1X4.75 VT (Anchor) ×2 IMPLANT
ANCHOR PEEK 4.75X19.1 SWLK C (Anchor) ×4 IMPLANT
BOOTIES KNEE HIGH SLOAN (MISCELLANEOUS) ×6 IMPLANT
BURR OVAL 8 FLU 4.0MM X 13CM (MISCELLANEOUS) ×1
BURR OVAL 8 FLU 4.0X13 (MISCELLANEOUS) ×2 IMPLANT
CANNULA 5.75X7 CRYSTAL CLEAR (CANNULA) ×3 IMPLANT
CANNULA TWIST IN 8.25X7CM (CANNULA) ×2 IMPLANT
COOLER ICEMAN CLASSIC (MISCELLANEOUS) IMPLANT
COVER SURGICAL LIGHT HANDLE (MISCELLANEOUS) ×3 IMPLANT
COVER WAND RF STERILE (DRAPES) IMPLANT
CUTTER BONE 4.0MM X 13CM (MISCELLANEOUS) ×3 IMPLANT
DISSECTOR  3.8MM X 13CM (MISCELLANEOUS)
DISSECTOR 3.8MM X 13CM (MISCELLANEOUS) IMPLANT
DRAPE IMP U-DRAPE 54X76 (DRAPES) ×3 IMPLANT
DRAPE INCISE IOBAN 66X45 STRL (DRAPES) IMPLANT
DRAPE ORTHO SPLIT 77X108 STRL (DRAPES) ×6
DRAPE STERI 35X30 U-POUCH (DRAPES) ×3 IMPLANT
DRAPE SURG ORHT 6 SPLT 77X108 (DRAPES) ×2 IMPLANT
DRAPE U-SHAPE 47X51 STRL (DRAPES) ×6 IMPLANT
DRSG PAD ABDOMINAL 8X10 ST (GAUZE/BANDAGES/DRESSINGS) ×6 IMPLANT
DURAPREP 26ML APPLICATOR (WOUND CARE) ×3 IMPLANT
ELECT REM PT RETURN 15FT ADLT (MISCELLANEOUS) ×3 IMPLANT
GAUZE SPONGE 4X4 12PLY STRL (GAUZE/BANDAGES/DRESSINGS) ×3 IMPLANT
GAUZE XEROFORM 1X8 LF (GAUZE/BANDAGES/DRESSINGS) ×3 IMPLANT
GLOVE BIO SURGEON STRL SZ7.5 (GLOVE) ×3 IMPLANT
GLOVE BIOGEL PI IND STRL 7.0 (GLOVE) ×1 IMPLANT
GLOVE BIOGEL PI IND STRL 8 (GLOVE) ×1 IMPLANT
GLOVE BIOGEL PI INDICATOR 7.0 (GLOVE) ×2
GLOVE BIOGEL PI INDICATOR 8 (GLOVE) ×2
GLOVE SURG SS PI 7.0 STRL IVOR (GLOVE) ×3 IMPLANT
GOWN STRL REUS W/TWL LRG LVL3 (GOWN DISPOSABLE) ×3 IMPLANT
GOWN STRL REUS W/TWL XL LVL3 (GOWN DISPOSABLE) ×3 IMPLANT
KIT BASIN (CUSTOM PROCEDURE TRAY) ×3 IMPLANT
KIT PUSHLOCK 2.9 HIP (KITS) IMPLANT
KIT STR SPEAR 1.8 FBRTK DISP (KITS) IMPLANT
KIT TURNOVER KIT A (KITS) IMPLANT
LASSO 90 CVE QUICKPAS (DISPOSABLE) IMPLANT
LASSO CRESCENT QUICKPASS (SUTURE) IMPLANT
MANIFOLD NEPTUNE II (INSTRUMENTS) ×3 IMPLANT
NDL HYPO 25X1 1.5 SAFETY (NEEDLE) IMPLANT
NDL SCORPION MULTI FIRE (NEEDLE) IMPLANT
NEEDLE HYPO 25X1 1.5 SAFETY (NEEDLE) IMPLANT
NEEDLE SCORPION MULTI FIRE (NEEDLE) ×3 IMPLANT
PACK DSU ARTHROSCOPY (CUSTOM PROCEDURE TRAY) ×3 IMPLANT
PAD COLD SHLDR WRAP-ON (PAD) IMPLANT
PENCIL SMOKE EVACUATOR (MISCELLANEOUS) IMPLANT
PROBE BIPOLAR ATHRO 135MM 90D (MISCELLANEOUS) ×3 IMPLANT
PROTECTOR NERVE ULNAR (MISCELLANEOUS) ×3 IMPLANT
RESTRAINT HEAD UNIVERSAL NS (MISCELLANEOUS) IMPLANT
SLING ARM FOAM STRAP LRG (SOFTGOODS) IMPLANT
SLING ARM FOAM STRAP MED (SOFTGOODS) IMPLANT
SLING ARM FOAM STRAP XLG (SOFTGOODS) ×2 IMPLANT
SLING ARM IMMOBILIZER LRG (SOFTGOODS) IMPLANT
SLING ARM IMMOBILIZER MED (SOFTGOODS) IMPLANT
SUPPORT WRAP ARM LG (MISCELLANEOUS) ×3 IMPLANT
SUT ETHILON 3 0 PS 1 (SUTURE) ×3 IMPLANT
SUT PDS AB 1 CT1 27 (SUTURE) IMPLANT
SUT TIGER TAPE 7 IN WHITE (SUTURE) IMPLANT
SUTURE TAPE 1.3 40 TPR END (SUTURE) IMPLANT
SUTURETAPE 1.3 40 TPR END (SUTURE)
TAPE CLOTH SURG 6X10 WHT LF (GAUZE/BANDAGES/DRESSINGS) ×2 IMPLANT
TAPE FIBER 2MM 7IN #2 BLUE (SUTURE) IMPLANT
TAPE LABRALWHITE 1.5X36 (TAPE) IMPLANT
TAPE SUT LABRALTAP WHT/BLK (SUTURE) IMPLANT
TOWEL OR 17X26 10 PK STRL BLUE (TOWEL DISPOSABLE) ×3 IMPLANT
TOWEL OR NON WOVEN STRL DISP B (DISPOSABLE) ×3 IMPLANT
TUBING ARTHROSCOPY IRRIG 16FT (MISCELLANEOUS) ×3 IMPLANT
TUBING CONNECTING 10 (TUBING) ×4 IMPLANT
TUBING CONNECTING 10' (TUBING) ×2

## 2020-05-03 NOTE — H&P (Signed)
Jose Stone is an 74 y.o. male.   Chief Complaint: Left shoulder pain HPI: 74 year old male with history of partial rotator cuff tearing with tendinopathy and instability of the long head biceps tendon.  Failed conservative management.  Indicated for surgical treatment.  Past Medical History:  Diagnosis Date  . Atrial fibrillation ([place])   . CAD (coronary artery disease)   . Cancer ([place])    melanoma 1987  . Complication of anesthesia    Hypotensive with general anesth.  . Dysrhythmia    Afib,Aflutter  . GERD (gastroesophageal reflux disease)   . Hyperlipidemia    VSD  . Hypertension   . Stroke [facility])    micro cva  . 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 CARDIAC CATHETERIZATION    . CARDIOVERSION N/A 02/29/2020   Procedure: CARDIOVERSION;  Surgeon: [name], MD;  Location: [facility] ENDOSCOPY;  Service: Cardiovascular;  Laterality: N/A;  . DISTAL INTERPHALANGEAL JOINT FUSION Left 01/19/2019   Procedure: HALLUX INTERPHANGEAL JOINT FUSION LEFT FOOT;  Surgeon: Trula Slade, DPM;  Location: [place];  Service: Podiatry;  Laterality: Left;  LEFT BLOCK  . hipospedious    . LEFT HEART CATHETERIZATION WITH CORONARY ANGIOGRAM N/A 08/14/2014   Procedure: LEFT HEART CATHETERIZATION WITH CORONARY ANGIOGRAM;  Surgeon: Birdie Riddle, MD;  Location: [place] CATH LAB;  Service: Cardiovascular;  Laterality: N/A;  . MASS EXCISION Left 01/19/2019   Procedure: EXCISION BENIGN LESION 2.OCM LEFT FOOT;  Surgeon: Trula Slade, DPM;  Location: [place];  Service: Podiatry;  Laterality: Left;  . SHOULDER SURGERY    . TONSILLECTOMY  1955    Family History  Problem Relation Age of Onset  . Pancreatic cancer Mother   . Aortic aneurysm Maternal Grandmother   . Heart attack Maternal Grandfather   . Diabetes Paternal Grandfather   . CAD Paternal Grandfather    Social History:  reports that he has never smoked. He  has never used smokeless tobacco. He reports that he does not drink alcohol and does not use drugs.  Allergies:  Allergies  Allergen Reactions  . Codeine Nausea Only  . Mupirocin Itching  . Hydrocodone Rash  . Neosporin [Neomycin-Bacitracin Zn-Polymyx] Rash  . Oxycodone Rash    Medications Prior to Admission  Medication Sig Dispense Refill  . allopurinol (ZYLOPRIM) 100 MG tablet Take 200 mg by mouth daily.   0  . aspirin 81 MG tablet Take 81 mg by mouth daily.    Marland Kitchen atorvastatin (LIPITOR) 80 MG tablet TAKE 1 TABLET BY MOUTH DAILY AT 6 PM (Patient taking differently: Take 80 mg by mouth daily. ) 90 tablet 0  . diltiazem (CARDIZEM CD) 120 MG 24 hr capsule Take 2 capsules (240 mg total) by mouth at bedtime. 180 capsule 1  . meloxicam (MOBIC) 15 MG tablet Take 15 mg by mouth daily.    . Multiple Vitamins-Minerals (CENTRUM SILVER ULTRA MENS PO) Take 1 tablet by mouth daily.     . Omega-3 Fat Ac-Cholecalciferol (DRY EYE OMEGA BENEFITS/VIT D-3 PO) Take 1 capsule by mouth in the morning, at noon, and at bedtime. spreads them out throughout the day    . omeprazole (PRILOSEC) 40 MG capsule Take 40 mg by mouth every morning. 30-60 minutes prior to breakfast.  3  . PARoxetine (PAXIL) 20 MG tablet Take 0.5 tablets (10 mg total) by mouth every other day. 30 tablet 0  .  Polyethyl Glycol-Propyl Glycol (SYSTANE OP) Apply 1 drop to eye 4 (four) times daily as needed (dry eyes).     Marland Kitchen testosterone cypionate (DEPOTESTOSTERONE CYPIONATE) 200 MG/ML injection Inject 200 mg into the muscle every 14 (fourteen) days. Next injection: 01/17/2019  1  . valsartan (DIOVAN) 160 MG tablet Take 1 tablet (160 mg total) by mouth daily. 135 tablet 1  . XARELTO 20 MG TABS tablet TAKE 1 TABLET BY MOUTH EVERY DAY WITH SUPPER (Patient taking differently: Take 20 mg by mouth daily with supper. ) 90 tablet 3  . acetaminophen (TYLENOL) 500 MG tablet Take 1,000 mg by mouth every 6 (six) hours as needed for mild pain or headache.    .  COLCRYS 0.6 MG tablet Take 0.6 mg by mouth daily as needed (FOR GOUT).     Marland Kitchen loratadine (CLARITIN) 10 MG tablet Take 10 mg by mouth daily as needed for allergies.    . nitroGLYCERIN (NITROSTAT) 0.4 MG SL tablet Place 1 tablet (0.4 mg total) under the tongue every 5 (five) minutes as needed for chest pain. Max 3 doses. 25 tablet 5    No results found for this or any previous visit (from the past 48 hour(s)). No results found.  Review of Systems  All other systems reviewed and are negative.   Blood pressure (!) 152/78, pulse (!) 58, temperature 98.5 F (36.9 C), temperature source Oral, resp. rate 16, height 6\' 1"  (1.854 m), weight 110.8 kg, SpO2 96 %. Physical Exam HENT:     Head: Atraumatic.  Eyes:     Extraocular Movements: Extraocular movements intact.  Cardiovascular:     Pulses: Normal pulses.  Pulmonary:     Effort: Pulmonary effort is normal.  Musculoskeletal:     Comments: L shoulder pain with cuff and biceps testing.  Skin:    General: Skin is warm.  Neurological:     Mental Status: He is alert.  Psychiatric:        Mood and Affect: Mood normal.      Assessment/Plan Left shoulder partial-thickness rotator cuff tear with biceps instability Plan left shoulder arthroscopic debridement, subacromial decompression, biceps tenotomy Risks / benefits of surgery discussed Consent on chart  NPO for OR Preop antibiotics   Isabella Stalling, MD 05/03/2020, 8:39 AM

## 2020-05-03 NOTE — Transfer of Care (Signed)
Immediate Anesthesia Transfer of Care Note  Patient: Jose Stone  Procedure(s) Performed: Procedure(s): SHOULDER ARTHROSCOPY DEBRIDEMENT PARTIAL ROTATOR CUFF REPAIR, SUBACROMIAL DECOMPRESSION, BICEPS TENOTOMY (Left)  Patient Location: PACU  Anesthesia Type:General  Level of Consciousness:  sedated, patient cooperative and responds to stimulation  Airway & Oxygen Therapy:Patient Spontanous Breathing and Patient connected to face mask oxgen  Post-op Assessment:  Report given to PACU RN and Post -op Vital signs reviewed and stable  Post vital signs:  Reviewed and stable  Last Vitals:  Vitals:   05/03/20 0912 05/03/20 1045  BP: (!) 152/63 (!) 156/79  Pulse: (!) 50 (!) 57  Resp: 15 15  Temp:  36.5 C  SpO2: 62% (P) 83%    Complications: No apparent anesthesia complications

## 2020-05-03 NOTE — Op Note (Signed)
Procedure(s): Procedure Note  WEBSTER PATRONE male 74 y.o. 05/03/2020  Preoperative diagnosis: #1 left shoulder partial-thickness rotator cuff tear #2 left shoulder impingement with unfavorable acromial anatomy #3 left shoulder biceps tendinopathy and instability  Postoperative diagnosis: Same  Procedure(s) and Anesthesia Type: #1 left shoulder arthroscopic rotator cuff repair #2 left shoulder arthroscopic subacromial decompression #3 left shoulder proximal long head biceps tenotomy  Surgeon(s) and Role:    Tania Ade, MD - Primary     Surgeon: Isabella Stalling   Assistants: Jeanmarie Hubert PA-C (Danielle was present and scrubbed throughout the procedure and was essential in positioning, assisting with the camera and instrumentation,, and closure)  Anesthesia: General endotracheal anesthesia with preoperative interscalene block given by the attending anesthesiologist    Procedure Detail   Estimated Blood Loss: Min         Drains: none  Blood Given: none         Specimens: none        Complications:  * No complications entered in OR log *         Disposition: PACU - hemodynamically stable.         Condition: stable    Procedure:   INDICATIONS FOR SURGERY: The patient is 74 y.o. male who has had a long history of left shoulder pain which is failed conservative management.  MRI revealed rotator cuff tearing as well as subluxation of the biceps tendon.  He was indicated for surgical treatment to try and decrease pain and restore function.  OPERATIVE FINDINGS: Examination under anesthesia: No significant stiffness or instability  DESCRIPTION OF PROCEDURE: The patient was identified in preoperative  holding area where I personally marked the operative site after  verifying site, side, and procedure with the patient. An interscalene block was given by the attending anesthesiologist the holding area.  The patient was taken back to the operating room where  general anesthesia was induced without complication and was placed in the beach-chair position with the back  elevated about 60 degrees and all extremities and head and neck carefully padded and  positioned.   The left upper extremity was then prepped and  draped in a standard sterile fashion. The appropriate time-out  procedure was carried out. The patient did receive IV antibiotics  within 30 minutes of incision.   A small posterior portal incision was made and the arthroscope was introduced into the joint. An anterior portal was then established above the subscapularis using needle localization. Small cannula was placed anteriorly. Diagnostic arthroscopy was then carried out.  The biceps tendon was noted to be severely tendinopathic and also dislocated out of the groove.  Therefore through a small anterior incision curved Mayo scissors were used to release the long head biceps from the superior labrum and it was allowed to retract from the joint.  The subscapularis was noted to be fairly degenerated but formal repair was not felt to be indicated.  This was debrided back to bleeding healthy tendon.  The undersurface of the supraspinatus and infraspinatus appeared intact from the articular surface.  Glenohumeral joint surfaces showed some mild grade 1 and 2 changes.  The superior labrum and posterior labrum had some flap tears which were debrided back to stable labrum.  The arthroscope was then introduced into the subacromial space a standard lateral portal was established with needle localization. The shaver was used through the lateral portal to perform extensive bursectomy. Coracoacromial ligament was examined and found to be severely frayed indicating chronic impingement.  The bursal surface of the rotator cuff was examined and anteriorly he was noted to have a very high-grade partial tear with about 90% exposure of the tuberosity anteriorly.  This was felt to be indicated for repair rather  than simple debridement.  The tuberosity was debrided down to a bleeding bony surface.  A large cannula was placed laterally.  The repair was then carried out by first placing a 4.75 peek swivel lock anchor just off the articular margin centrally in the tear.  The 4 suture strands loaded into the anchor were passed evenly throughout the tear and brought over to an additional 4.75 peek swivel lock anchor laterally.  This brought the tendon down nicely over the prepared tuberosity.  The coracoacromial ligament was taken down off the anterior acromion with the ArthroCare exposing a large hooked anterior acromial spur. A high-speed bur was then used through the lateral portal to take down the anterior acromial spur from lateral to medial in a standard acromioplasty.  The acromioplasty was also viewed from the lateral portal and the bur was used as necessary to ensure that the acromion was completely flat from posterior to anterior.  The arthroscopic equipment was removed from the joint and the portals were closed with 3-0 nylon in an interrupted fashion. Sterile dressings were then applied including Xeroform 4 x 4's ABDs and tape. The patient was then allowed to awaken from general anesthesia, placed in a sling, transferred to the stretcher and taken to the recovery room in stable condition.   POSTOPERATIVE PLAN: The patient will be discharged home today and will followup in one week for suture removal and wound check.

## 2020-05-03 NOTE — Anesthesia Postprocedure Evaluation (Signed)
Anesthesia Post Note  Patient: Jose Stone  Procedure(s) Performed: SHOULDER ARTHROSCOPY DEBRIDEMENT PARTIAL ROTATOR CUFF REPAIR, SUBACROMIAL DECOMPRESSION, BICEPS TENOTOMY (Left Shoulder)     Patient location during evaluation: PACU Anesthesia Type: General Level of consciousness: awake and alert and oriented Pain management: pain level controlled Vital Signs Assessment: post-procedure vital signs reviewed and stable Respiratory status: spontaneous breathing, nonlabored ventilation and respiratory function stable Cardiovascular status: blood pressure returned to baseline Postop Assessment: no apparent nausea or vomiting Anesthetic complications: no   No complications documented.  Last Vitals:  Vitals:   05/03/20 1100 05/03/20 1115  BP: (!) 152/89 (!) 153/79  Pulse: (!) 50 (!) 50  Resp: 18 19  Temp:  36.9 C  SpO2: 100% 96%    Last Pain:  Vitals:   05/03/20 1115  TempSrc:   PainSc: 0-No pain                 Brennan Bailey

## 2020-05-03 NOTE — Anesthesia Procedure Notes (Addendum)
Procedure Name: Intubation Date/Time: 05/03/2020 9:40 AM Performed by: Lavina Hamman, CRNA Pre-anesthesia Checklist: Patient identified, Emergency Drugs available, Suction available, Patient being monitored and Timeout performed Patient Re-evaluated:Patient Re-evaluated prior to induction Oxygen Delivery Method: Circle system utilized Preoxygenation: Pre-oxygenation with 100% oxygen Induction Type: IV induction Ventilation: Mask ventilation without difficulty Laryngoscope Size: Mac and 4 Grade View: Grade III Tube type: Oral Tube size: 7.5 mm Number of attempts: 1 Airway Equipment and Method: Stylet Placement Confirmation: ETT inserted through vocal cords under direct vision,  positive ETCO2,  CO2 detector and breath sounds checked- equal and bilateral Secured at: 24 cm Tube secured with: Tape Dental Injury: Teeth and Oropharynx as per pre-operative assessment  Difficulty Due To: Difficult Airway- due to anterior larynx and Difficult Airway- due to limited oral opening

## 2020-05-03 NOTE — Progress Notes (Signed)
Assisted Dr. Doloris Hall with left shoulder interscalene block. Side rails up, monitors on throughout procedure. See vital signs in flow sheet. Tolerated Procedure well.

## 2020-05-03 NOTE — Discharge Instructions (Signed)

## 2020-05-03 NOTE — Anesthesia Procedure Notes (Signed)
Anesthesia Regional Block: Interscalene brachial plexus block   Pre-Anesthetic Checklist: ,, timeout performed, Correct Patient, Correct Site, Correct Laterality, Correct Procedure, Correct Position, site marked, Risks and benefits discussed, pre-op evaluation,  At surgeon's request and post-op pain management  Laterality: Left  Prep: Maximum Sterile Barrier Precautions used, chloraprep       Needles:  Injection technique: Single-shot  Needle Type: Echogenic Stimulator Needle     Needle Length: 9cm  Needle Gauge: 22     Additional Needles:   Procedures:,,,, ultrasound used (permanent image in chart),,,,  Narrative:  Start time: 05/03/2020 8:55 AM End time: 05/03/2020 8:58 AM Injection made incrementally with aspirations every 5 mL.  Performed by: Personally  Anesthesiologist: Brennan Bailey, MD  Additional Notes: Risks, benefits, and alternative discussed. Patient gave consent for procedure. Patient prepped and draped in sterile fashion. Sedation administered, patient remains easily responsive to voice. Relevant anatomy identified with ultrasound guidance. Local anesthetic given in 5cc increments with no signs or symptoms of intravascular injection. No pain or paraesthesias with injection. Patient monitored throughout procedure with signs of LAST or immediate complications. Tolerated well. Ultrasound image placed in chart.  Tawny Asal, MD

## 2020-05-04 ENCOUNTER — Encounter (HOSPITAL_COMMUNITY): Payer: Self-pay | Admitting: Orthopedic Surgery

## 2020-05-10 ENCOUNTER — Other Ambulatory Visit: Payer: Self-pay

## 2020-05-10 ENCOUNTER — Encounter: Payer: Self-pay | Admitting: Internal Medicine

## 2020-05-10 ENCOUNTER — Ambulatory Visit (INDEPENDENT_AMBULATORY_CARE_PROVIDER_SITE_OTHER): Payer: Medicare Other | Admitting: Internal Medicine

## 2020-05-10 VITALS — BP 148/78 | HR 67 | Ht 73.0 in | Wt 241.0 lb

## 2020-05-10 DIAGNOSIS — I351 Nonrheumatic aortic (valve) insufficiency: Secondary | ICD-10-CM | POA: Diagnosis not present

## 2020-05-10 DIAGNOSIS — I10 Essential (primary) hypertension: Secondary | ICD-10-CM

## 2020-05-10 DIAGNOSIS — I77810 Thoracic aortic ectasia: Secondary | ICD-10-CM

## 2020-05-10 DIAGNOSIS — I480 Paroxysmal atrial fibrillation: Secondary | ICD-10-CM | POA: Diagnosis not present

## 2020-05-10 NOTE — Progress Notes (Signed)
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 This is never caused issues in fact she's had right and left heart catheterizations dating back to the 1970s. 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 Other coronary risk factors include hypertension, dyslipidemia and family history of heart disease. He was sent for cardiac catheterization in 2006 for follow-up of VSD, at which time he had an echocardiogram which showed mild aortic regurgitation, a small membranous VSD and left atrial enlargement with an EF of 70%. He had a stress test at that time which showed findings concerning for him for a septal and inferoapical ischemia. He therefore was referred to left heart catheterization with strep demonstrated a 50-70% mid LAD stenosis and mild disease of the mid circumflex and first obtuse marginal branches. He's been managed medically since that time and previously saw Dr. Daneen Schick.  Recently he's been having some symptoms of sweating, palpitations and dizziness and eventually was found to be in atrial fibrillation when he presented to the hospital. At that time his troponin was elevated. He is placed on diltiazem which slowed his heart rate and he converted back to sinus spontaneously. Based on his elevated troponin, he was referred for left heart catheterization by Dr. Doylene Canard. This demonstrated a 60-70% mid LAD stenosis and a 90% obtuse marginal stenosis. Subsequently he underwent coronary intervention with placement of a 2.5 x 15 mm Xience Alpine stent to the mid OM. After this he became asymptomatic. He was going to have stress testing to determine the significance of his mid LAD lesion however it appears to be not significantly changed since his  heart catheterization 2006. He reports no further symptoms of atrial fibrillation but is concerned about bradycardia. He was not referred for cardiac rehabilitation due to the thought that he may need additional intervention to the LAD.  Jose Stone returns today for follow-up. She's done quite well in cardiac rehabilitation. At his last office visit we discontinued his diltiazem for bradycardia. He still has a low heart rate which has some degree of chronotropic incompetence. He reports no recurrent atrial fibrillation that were aware of. He continues on dual antiplatelet therapy. He had a stress test prior to enrolling in cardiac redilatation which was negative for ischemia.  I saw Jose Stone back in the office today. He is accompanied by his wife who was a cardiac nurse at the [employer] in [place] for a number years in the 1970s. They brought in pronounced of his heart rate monitor and had several questions about his heart rate and arrhythmias. My interpretation of the monitor indicates that he had some episodes of short bursts of atrial tachycardia but no evidence for A. fib. He also has PACs. Heart rate however was a low and remained that way despite stopping his calcium channel blocker. He was recently in the emergency room and was noted to have some paroxysmal tachycardia in the ER with a right bundle-branch pattern. This is what led to monitor placement. He reports fatigue which could be certainly related to his lower heart rate. He also says that he's developed gynecomastia which he relates to [place]. I've done a search on the medicine and do not see that however he reports  in the product labeling.  03/21/2016  Jose Stone was seen today in the office for an acute visit. He noted this morning shortly after waking up his heart rate was elevated in the low 100s. It improved somewhat and then around 9:00 it increased up to about 130. The heart rate is been stable since then and he called in for a new acute  triage visit. He reports feeling somewhat fatigued and noting that his heart rate is elevated. He denies any worsening shortness of breath or chest pain. EKG in the office shows atrial flutter with a regular rhythm at 131 bpm. As per recent notes he was previous on aspirin and clopidogrel but the caput ago was stopped. He was not on triple therapy. Currently he is only on low-dose aspirin. 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 I'm concerned about up titrating his medications because she's had history of slow ventricular rates in the past. We discussed a number of options but ultimately the best option is for him to undergo cardioversion to get back into normal rhythm.  04/17/2016  Jose Stone was seen back in the office today for follow-up. At his last office visit he was noted to be in atrial flutter with a rate of 131. He was referred directly to the ER and underwent cardioversion after being started on Xarelto. He is remained on Xarelto since then without any bleeding problems. I'm happy to say he is maintaining sinus rhythm today. He does report significant fatigue however. He is mostly compliant with CPAP however he had a sleep study a number of years ago and he is set possibly to low of an airway setting. He's had weight gain since then. He also has been having some bradycardia with heart rates in the 40s and 50s. He is on low-dose beta blocker but I been hesitant to stop that because of his RVR. He does report heart rate increases with exercise.   06/20/2016  Jose Stone returns today for follow-up. He underwent a sleep study on 06/05/2016. He did have a diagnosis of sleep apnea however that study indicated a very low AHI of 2.5 events per hour. He had actually about 25 events per hour in rem sleep, but overall the REM sleep latency was only about 8%. Ultimately he was diagnosis not having any significant apnea. This is led to some confusion as he previously was  wearing [place] CPAP device. He reports no change in his symptoms of fatigue which we thought could be related to sleep apnea. It also may be related to bradycardia and his beta blocker.  09/23/2016  Jose Stone comes back today and is doing well. His fatigue has improved somewhat with discontinuing his B-Blocker. Heartrate is increased. I spoke with Dr. Claiborne Billings about the possibility of OSA and he is not felt to have this. Dr. Claiborne Billings noted that RLS was present. He does not desire treatment for this. Blood pressure was somewhat elevated today. EKG shows sinus with PAC's. I reviewed a spreadsheet of his blood pressures today.  03/31/2017  Jose Stone was seen today in follow-up. Overall he is doing very well. He denies any chest pain worsening shortness of breath. Blood pressure is well-controlled. He brought in a graft indicating excellent control blood pressure and heart rate. He said no breakthrough A. fib. His recent lab work was provided by his PCP. Total cholesterol 134, trig Richards 85, HDL-C 49, LDL-C 68, non-HDL of 85. This represents excellent control. He is  planning a trip out [place] is been provided some Diamox to use as needed. He denies any bleeding problems on Xarelto.  09/03/2017  Jose Stone returns today for follow-up.  He underwent a recent stress test after I saw him in the emergency department for chest pain.  At the time he took some nitroglycerin which resulted in improvement in his symptoms however ruled out for MI by negative troponins.  His stress test demonstrated a small area of reversible ischemia in the inferolateral wall.  It was interpreted as intermediate risk.  Since then he has had no further chest pain.  I discussed the findings with him today and we talked about various options including medical therapy and/or repeat cardiac catheterization.  As he remains asymptomatic, I do not feel that medication adjustments necessary at this time.  Finally, he notes that he has had some  morning hypertension which is more significant than his daytime readings.  10/15/2017  Bradycardia returns today for follow-up.  As previously noted he had a mildly abnormal stress test and was having some chest discomfort.  That is completely resolved.  We have moved around his medications a little bit but he notes that his blood pressure still remains elevated at times during the day.  Echo was performed on 10/01/2017 which showed LVEF that was higher at 60-65%, stable dilated aortic root at 4.4 cm and the ascending aorta measured 4.0 cm.  No regional wall motion abnormalities were noted.  01/15/2018  Jose Stone returns today for follow-up of his blood pressure.  He reports after adjusting his medicines that in general his blood pressures been well controlled.  He denies any recurrent episodes of dizziness or lightheadedness.  Recently had some testing of testosterone.  He has had a number of urologic issues and possible damage to blood flow to 1 of the testicles.  That coupled with age I suspect his lead to low testosterone.  Recently as low as the 200s.  His urologist Dr. Junious Silk, is recommending supplemental testosterone.  He is asking for my cardiac opinion whether this is safe to use.  07/19/2018  Jose Stone returns today for hospital follow-up.  Fortunately was seen in the ER for acute onset A. fib with RVR.  This is in the setting of what appears to be marked dehydration.  He was working outside in the heat and developed acute renal failure.  Potassium was up to 6.1.  He responded to hydration and repeat renal profile last week showed that his creatinine returned to baseline.  Potassium was actually slightly low at 3.3.  He is not on repletion.  I stopped his chlorthalidone and reduced his ARB to 160 mg valsartan daily.  He denies any recurrent A. Fib.  11/23/2018  Jose Stone seen today for labile blood pressures.  He recently messaged the office and was noted to have elevated blood pressures.   Previously had been on higher dose blood pressure medication however this was reduced including stopping chlorthalidone and reducing his ARB because of hyperkalemia.  The decrease in ARB probably was the reason why his blood pressures have remained elevated.  His wife is also concerned about a wider pulse pressure which at times can be close to 100 mmHg.  Despite this diastolics have not been below 50.  EKG shows sinus rhythm with sinus arrhythmia incomplete right bundle branch today at 66.  02/22/2020  Jose Stone seen today in follow-up.  Overall he seems to be doing well.  He had some  recent surgery including a brow/lid lift.  He is also had a toe fusion.  Of note, he was noted to be tachycardic today and he seemed to be unaware of this.  EKG was performed which showed A. fib with RVR.  He has had this in the past and is spontaneously converted, in fact once we set him up for cardioversion.  He is on Xarelto and low-dose diltiazem.  He is scheduled to have an MRI tomorrow of his shoulder with a plan for upcoming shoulder surgery.  05/10/2020  Jose Stone seen today in follow-up.  EKG shows he is maintaining sinus rhythm.  He underwent successful left shoulder surgery.  He is slow to recover from this and still has some pain.  Blood pressure is elevated today.  He was off of Xarelto for short period of time but then restarted it.  I also readjusted his medications including increasing his diltiazem and decreasing the valsartan.  If possible we may need to go back up on the valsartan however I would like for him to monitor his blood pressure.  PMHx:  Past Medical History:  Diagnosis Date  . Atrial fibrillation ([place])   . CAD (coronary artery disease)   . Cancer ([place])    melanoma 1987  . Complication of anesthesia    Hypotensive with general anesth.  . Dysrhythmia    Afib,Aflutter  . GERD (gastroesophageal reflux disease)   . Hyperlipidemia    VSD  . Hypertension   . 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 CARDIAC CATHETERIZATION    . CARDIOVERSION N/A 02/29/2020   Procedure: CARDIOVERSION;  Surgeon: [name], MD;  Location: [facility] ENDOSCOPY;  Service: Cardiovascular;  Laterality: N/A;  . DISTAL INTERPHALANGEAL JOINT FUSION Left 01/19/2019   Procedure: HALLUX INTERPHANGEAL JOINT FUSION LEFT FOOT;  Surgeon: Trula Slade, DPM;  Location: [facility];  Service: Podiatry;  Laterality: Left;  LEFT BLOCK  . hipospedious    . LEFT HEART CATHETERIZATION WITH CORONARY ANGIOGRAM N/A 08/14/2014   Procedure: LEFT HEART CATHETERIZATION WITH CORONARY ANGIOGRAM;  Surgeon: Birdie Riddle, MD;  Location: [facility] CATH LAB;  Service: Cardiovascular;  Laterality: N/A;  . MASS EXCISION Left 01/19/2019   Procedure: EXCISION BENIGN LESION 2.OCM LEFT FOOT;  Surgeon: Trula Slade, DPM;  Location: [facility];  Service: Podiatry;  Laterality: Left;  . SHOULDER ARTHROSCOPY WITH SUBACROMIAL DECOMPRESSION Left 05/03/2020   Procedure: SHOULDER ARTHROSCOPY DEBRIDEMENT PARTIAL ROTATOR CUFF REPAIR, SUBACROMIAL DECOMPRESSION, BICEPS TENOTOMY;  Surgeon: Tania Ade, MD;  Location: [facility] ORS;  Service: Orthopedics;  Laterality: Left;  . SHOULDER SURGERY    . TONSILLECTOMY  1955    FAMHx:  Family History  Problem Relation Age of Onset  . Pancreatic cancer Mother   . Aortic aneurysm Maternal Grandmother   . Heart attack Maternal Grandfather   . Diabetes Paternal Grandfather   . CAD Paternal Grandfather     SOCHx:   reports that he has never smoked. He has never used smokeless tobacco. He reports that he does not drink alcohol and does not use drugs.  ALLERGIES:  Allergies  Allergen Reactions  . Codeine Nausea Only  . Mupirocin Itching  . Hydrocodone Rash  . Neosporin [Neomycin-Bacitracin Zn-Polymyx] Rash  . Oxycodone Rash    ROS: Pertinent items noted in HPI and remainder of comprehensive  ROS otherwise negative.  HOME MEDS: Current Outpatient Medications  Medication Sig Dispense Refill  . acetaminophen (TYLENOL) 500 MG tablet Take 1,000 mg by mouth every 6 (six) hours as needed for mild pain or headache.    . allopurinol (ZYLOPRIM) 100 MG tablet Take 200 mg by mouth daily.   0  . aspirin 81 MG tablet Take 81 mg by mouth daily.    Marland Kitchen atorvastatin (LIPITOR) 80 MG tablet TAKE 1 TABLET BY MOUTH DAILY AT 6 PM (Patient taking differently: Take 80 mg by mouth daily. ) 90 tablet 0  . COLCRYS 0.6 MG tablet Take 0.6 mg by mouth daily as needed (FOR GOUT).     Marland Kitchen diltiazem (CARDIZEM CD) 120 MG 24 hr capsule Take 2 capsules (240 mg total) by mouth at bedtime. 180 capsule 1  . HYDROcodone-acetaminophen (NORCO) 5-325 MG tablet Take 1-2 tablets by mouth every 4 (four) hours as needed for moderate pain (, take with benadryl to avoid rash). 30 tablet 0  . loratadine (CLARITIN) 10 MG tablet Take 10 mg by mouth daily as needed for allergies.    . Multiple Vitamins-Minerals (CENTRUM SILVER ULTRA MENS PO) Take 1 tablet by mouth daily.     . nitroGLYCERIN (NITROSTAT) 0.4 MG SL tablet Place 1 tablet (0.4 mg total) under the tongue every 5 (five) minutes as needed for chest pain. Max 3 doses. 25 tablet 5  . omeprazole (PRILOSEC) 40 MG capsule Take 40 mg by mouth every morning. 30-60 minutes prior to breakfast.  3  . PARoxetine (PAXIL) 20 MG tablet Take 0.5 tablets (10 mg total) by mouth every other day. 30 tablet 0  . Polyethyl Glycol-Propyl Glycol (SYSTANE OP) Apply 1 drop to eye 4 (four) times daily as needed (dry eyes).     Marland Kitchen testosterone cypionate (DEPOTESTOSTERONE CYPIONATE) 200 MG/ML injection Inject 200 mg into the muscle every 14 (fourteen) days. Next injection: 01/17/2019  1  . tiZANidine (ZANAFLEX) 4 MG tablet Take 1 tablet (4 mg total) by mouth every 8 (eight) hours as needed for muscle spasms. 30 tablet 1  . valsartan (DIOVAN) 160 MG tablet Take 1 tablet (160 mg total) by mouth daily. 135 tablet  1  . XARELTO 20 MG TABS tablet TAKE 1 TABLET BY MOUTH EVERY DAY WITH SUPPER (Patient taking differently: Take 20 mg by mouth daily with supper. ) 90 tablet 3   No current facility-administered medications for this visit.    LABS/IMAGING: No results found for this or any previous visit (from the past 48 hour(s)). No results found.  VITALS: BP (!) 148/78   Pulse 67   Ht 6\' 1"  (1.854 m)   Wt 241 lb (109.3 kg)   SpO2 95%   BMI 31.80 kg/m   EXAM: General appearance: alert and no distress Neck: no carotid bruit, no JVD and thyroid not enlarged, symmetric, no tenderness/mass/nodules Lungs: clear to auscultation bilaterally Heart: irregularly irregular rhythm and tachycardic Abdomen: soft, non-tender; bowel sounds normal; no masses,  no organomegaly Extremities: extremities normal, atraumatic, no cyanosis or edema Pulses: 2+ and symmetric Skin: Skin color, texture, turgor normal. No rashes or lesions Neurologic: Grossly normal Psych: Pleasant  EKG: Normal sinus rhythm 67, incomplete right bundle branch block-personally reviewed  ASSESSMENT: 1. A. fib with RVR -status post DCCV (02/2020), maintaining sinus 2. Chest pain resolved- mildly abnormal nuclear stress test, normal LVEF 60-65% on echo/normal wall motion 3. Paroxysmal atrial flutter - CHADSVASC score of 1 on Xarelto 4. Coronary artery disease status post PCI to the OM with a  Xience Alpine DES (2.515 mm) - on low dose Aspirin 5. Residual moderate to severe mid LAD disease 6. Hypertension 7. Dyslipidemia 8. Small perimembranous VSD 9. Persistent L SVC 10. Fatigue-possibly symptomatic bradycardia  11. Trivial AI 12. 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 Low testosterone  PLAN: 1.   Jose Stone seems to be maintaining sinus rhythm and feels well.  He underwent shoulder surgery and is slow to recover from that.  Blood pressure is a little bit more elevated after adjustment of his medications.  I  advised him to monitor it.  We may need to go back up on his valsartan.  Otherwise he seems to be doing well.  Plan follow-up with me in 6 months or sooner as necessary.  [name], MD, [facility], [place] Director of the [facility]  Card[facility] Attending Cardiologist  Direct Dial: [phone]  Fax: [phone]  Website:  [email or web address]  Nadean Corwin Fannye Myer 05/10/2020, 1:28 PM

## 2020-05-10 NOTE — Patient Instructions (Signed)

## 2020-05-24 DIAGNOSIS — Z4789 Encounter for other orthopedic aftercare: Secondary | ICD-10-CM | POA: Diagnosis not present

## 2020-05-24 DIAGNOSIS — S46092D Other injury of muscle(s) and tendon(s) of the rotator cuff of left shoulder, subsequent encounter: Secondary | ICD-10-CM | POA: Diagnosis not present

## 2020-05-28 DIAGNOSIS — S46092D Other injury of muscle(s) and tendon(s) of the rotator cuff of left shoulder, subsequent encounter: Secondary | ICD-10-CM | POA: Diagnosis not present

## 2020-05-28 DIAGNOSIS — Z4789 Encounter for other orthopedic aftercare: Secondary | ICD-10-CM | POA: Diagnosis not present

## 2020-06-01 ENCOUNTER — Telehealth: Payer: Self-pay | Admitting: *Deleted

## 2020-06-01 ENCOUNTER — Emergency Department (HOSPITAL_BASED_OUTPATIENT_CLINIC_OR_DEPARTMENT_OTHER)
Admission: EM | Admit: 2020-06-01 | Discharge: 2020-06-01 | Disposition: A | Discharge status: Home / Self Care | Payer: Medicare Other | Attending: Emergency Medicine | Admitting: Emergency Medicine

## 2020-06-01 ENCOUNTER — Ambulatory Visit (INDEPENDENT_AMBULATORY_CARE_PROVIDER_SITE_OTHER): Payer: Medicare Other

## 2020-06-01 ENCOUNTER — Other Ambulatory Visit: Payer: Self-pay

## 2020-06-01 ENCOUNTER — Encounter (HOSPITAL_BASED_OUTPATIENT_CLINIC_OR_DEPARTMENT_OTHER): Payer: Self-pay | Admitting: *Deleted

## 2020-06-01 ENCOUNTER — Ambulatory Visit (INDEPENDENT_AMBULATORY_CARE_PROVIDER_SITE_OTHER): Payer: Medicare Other | Admitting: Podiatry

## 2020-06-01 VITALS — BP 99/76 | HR 100 | Temp 100.2°F

## 2020-06-01 DIAGNOSIS — L089 Local infection of the skin and subcutaneous tissue, unspecified: Secondary | ICD-10-CM | POA: Diagnosis present

## 2020-06-01 DIAGNOSIS — C44309 Unspecified malignant neoplasm of skin of other parts of face: Secondary | ICD-10-CM | POA: Insufficient documentation

## 2020-06-01 DIAGNOSIS — I4891 Unspecified atrial fibrillation: Secondary | ICD-10-CM | POA: Diagnosis not present

## 2020-06-01 DIAGNOSIS — S92402K Displaced unspecified fracture of left great toe, subsequent encounter for fracture with nonunion: Secondary | ICD-10-CM

## 2020-06-01 DIAGNOSIS — M79672 Pain in left foot: Secondary | ICD-10-CM

## 2020-06-01 DIAGNOSIS — I10 Essential (primary) hypertension: Secondary | ICD-10-CM | POA: Insufficient documentation

## 2020-06-01 DIAGNOSIS — E785 Hyperlipidemia, unspecified: Secondary | ICD-10-CM | POA: Diagnosis not present

## 2020-06-01 DIAGNOSIS — Z79899 Other long term (current) drug therapy: Secondary | ICD-10-CM | POA: Diagnosis not present

## 2020-06-01 DIAGNOSIS — L97501 Non-pressure chronic ulcer of other part of unspecified foot limited to breakdown of skin: Secondary | ICD-10-CM | POA: Diagnosis not present

## 2020-06-01 DIAGNOSIS — L02612 Cutaneous abscess of left foot: Secondary | ICD-10-CM | POA: Diagnosis not present

## 2020-06-01 DIAGNOSIS — L03032 Cellulitis of left toe: Secondary | ICD-10-CM

## 2020-06-01 DIAGNOSIS — I2510 Atherosclerotic heart disease of native coronary artery without angina pectoris: Secondary | ICD-10-CM | POA: Diagnosis not present

## 2020-06-01 DIAGNOSIS — L03115 Cellulitis of right lower limb: Secondary | ICD-10-CM | POA: Diagnosis not present

## 2020-06-01 LAB — COMPREHENSIVE METABOLIC PANEL
ALT: 25 U/L (ref 0–44)
AST: 21 U/L (ref 15–41)
Albumin: 3.9 g/dL (ref 3.5–5.0)
Alkaline Phosphatase: 85 U/L (ref 38–126)
Anion gap: 12 (ref 5–15)
BUN: 22 mg/dL (ref 8–23)
CO2: 22 mmol/L (ref 22–32)
Calcium: 8.8 mg/dL — ABNORMAL LOW (ref 8.9–10.3)
Chloride: 99 mmol/L (ref 98–111)
Creatinine, Ser: 1.22 mg/dL (ref 0.61–1.24)
GFR calc Af Amer: >60 mL/min (ref >60)
GFR calc non Af Amer: 59 mL/min — ABNORMAL LOW (ref >60)
Glucose, Bld: 115 mg/dL — ABNORMAL HIGH (ref 70–99)
Potassium: 4.1 mmol/L (ref 3.5–5.1)
Sodium: 133 mmol/L — ABNORMAL LOW (ref 135–145)
Total Bilirubin: 0.7 mg/dL (ref 0.3–1.2)
Total Protein: 7.2 g/dL (ref 6.5–8.1)

## 2020-06-01 LAB — CBC WITH DIFFERENTIAL/PLATELET
Abs Immature Granulocytes: 0.03 10*3/uL (ref 0.00–0.07)
Basophils Absolute: 0 10*3/uL (ref 0.0–0.1)
Basophils Relative: 0 %
Eosinophils Absolute: 0.1 10*3/uL (ref 0.0–0.5)
Eosinophils Relative: 2 %
HCT: 44.4 % (ref 39.0–52.0)
Hemoglobin: 14.2 g/dL (ref 13.0–17.0)
Immature Granulocytes: 0 %
Lymphocytes Relative: 18 %
Lymphs Abs: 1.8 10*3/uL (ref 0.7–4.0)
MCH: 25.2 pg — ABNORMAL LOW (ref 26.0–34.0)
MCHC: 32 g/dL (ref 30.0–36.0)
MCV: 78.7 fL — ABNORMAL LOW (ref 80.0–100.0)
Monocytes Absolute: 0.8 10*3/uL (ref 0.1–1.0)
Monocytes Relative: 9 %
Neutro Abs: 6.8 10*3/uL (ref 1.7–7.7)
Neutrophils Relative %: 71 %
Platelets: 236 10*3/uL (ref 150–400)
RBC: 5.64 MIL/uL (ref 4.22–5.81)
RDW: 19 % — ABNORMAL HIGH (ref 11.5–15.5)
WBC: 9.6 10*3/uL (ref 4.0–10.5)
nRBC: 0 % (ref 0.0–0.2)

## 2020-06-01 MED ORDER — CEPHALEXIN 500 MG PO CAPS: 500.0000 mg | ORAL_CAPSULE | Freq: Three times a day (TID) | ORAL | 0 refills | Status: DC

## 2020-06-01 MED ORDER — VANCOMYCIN HCL IN DEXTROSE 1-5 GM/200ML-% IV SOLN
1000.0000 mg | Freq: Once | INTRAVENOUS | Status: AC
  Administered 2020-06-01: 1000 mg via INTRAVENOUS
  Filled 2020-06-01: qty 200

## 2020-06-01 NOTE — ED Triage Notes (Addendum)
Sent here by PMD for IV ABX for left foot wound . XRAY done today

## 2020-06-01 NOTE — Telephone Encounter (Signed)
Dr. Jacqualyn Posey requested [facility] ED be advised pt is on the way with cellulitis. I informed [facility] [place] of pt dx and destination by private vehicle.

## 2020-06-01 NOTE — Progress Notes (Signed)
Bilateral e subjective: 74 year old male presents the office today for concerns of infection to his left foot.  He recently had left shoulder surgery so he states he has not been attending to his left foot he is also not been wearing his shoes with the inserts around the house he felt the blister has come up and redness.  This started about 1 week ago.  He states he been getting worse again 102 degree fever yesterday but he thinks he is also under a lot of blankets.  He has had some nausea.  He presents today for evaluation. Denies any systemic complaints such as fevers, chills, nausea, vomiting. No acute changes since last appointment, and no other complaints at this time.   Objective: AAO x3, NAD DP/PT pulses palpable bilaterally, CRT less than 3 seconds Hyperkeratotic lesion with blister present in the medial left hallux IPJ. There is erythema to the hallux extending to the medial aspect the foot noticed edema mostly localized to the hallux.  There is no area fluctuation or crepitation.  Upon debridement of the callus, blister serosanguineous drainage was expressed there is no purulence. No pain with calf compression, swelling, warmth, erythema  Assessment: Left foot ulceration, cellulitis  Plan: -All treatment options discussed with the patient including all alternatives, risks, complications.  -X-rays obtained reviewed.  Hardware intact.  Nonunion present of the hallux IPJ.  No definitive evidence of osteomyelitis identified at this time. -Sharply debrided the wound today with a number [phone] with scalpel to any complications down to healthy, bleeding, viable granular tissue.-I debrided the callus, blister without any complications to reveal serosanguineous drainage which was cultured today. -Given the infection I do recommend report emergency department for further evaluation and blood work.  I like him to please give a dose of IV antibiotics and possible Keflex and await culture however if  systemically ill would benefit from admission and IV antibiotics. -Surgical shoe for now -I did send a prescription for Keflex to his pharmacy if he were to get discharged from emergency department. -Patient encouraged to call the office with any questions, concerns, change in symptoms.   Trula Slade DPM

## 2020-06-01 NOTE — ED Provider Notes (Signed)
[facility] EMERGENCY DEPARTMENT Provider Note   CSN: [id number] Arrival date & time: 06/01/20  1539     History Chief Complaint  Patient presents with  . Wound Infection    Jose Stone is a 74 y.o. male.  HPI 74 year old male presents with a left foot infection and need for IV antibiotics.  Was sent over from his podiatrist office for IV antibiotics and evaluation.  He states for a couple days has had a blister to his left great toe and some faint erythema to his foot.  He had a temperature up to 102 yesterday after developing chills but is not sure if the temperature was because of being under 70 blankets.  Otherwise he has had a little nausea but no vomiting.  The foot does not hurt but he has a little neuropathy.  His podiatrist debrided the wound and prescribed him Keflex in case he goes home and sent him here for evaluation.   Past Medical History:  Diagnosis Date  . Atrial fibrillation ([place])   . CAD (coronary artery disease)   . Cancer ([place])    melanoma 1987  . Complication of anesthesia    Hypotensive with general anesth.  . Dysrhythmia    Afib,Aflutter  . GERD (gastroesophageal reflux disease)   . Hyperlipidemia    VSD  . Hypertension   . Stroke [facility])    micro cva  . 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 Fibroma 01/31/2019  . Onychomycosis 10/13/2018  . AKI (acute kidney injury) ([place]) 07/13/2018  . Hyperkalemia 07/13/2018  . Atrial fibrillation with RVR ([place]) 07/13/2018  . Low testosterone in male 01/15/2018  . Chest pain 09/03/2017  . Abnormal nuclear stress test 09/03/2017  . Dizziness 07/21/2017  . PAF (paroxysmal atrial fibrillation) ([place]) 07/21/2017  . Aortic valve regurgitation 03/31/2017  . Dilated aortic root ([place]) 03/31/2017  . Symptomatic bradycardia 06/20/2016  . RBBB (right bundle branch block) 06/20/2016  . OSA on CPAP 04/17/2016  .  Excessive daytime sleepiness 04/17/2016  . Essential hypertension 11/16/2014  . Hyperlipidemia 11/16/2014  . GERD (gastroesophageal reflux disease) 11/16/2014  . Coronary artery disease due to lipid rich plaque 11/16/2014  . VSD (ventricular septal defect) 11/16/2014  . Coronary artery disease involving native coronary artery of native heart without angina pectoris 11/16/2014    Past Surgical History:  Procedure Laterality Date  . CARDIAC CATHETERIZATION    . CARDIOVERSION N/A 02/29/2020   Procedure: CARDIOVERSION;  Surgeon: [name], MD;  Location: [facility] ENDOSCOPY;  Service: Cardiovascular;  Laterality: N/A;  . DISTAL INTERPHALANGEAL JOINT FUSION Left 01/19/2019   Procedure: HALLUX INTERPHANGEAL JOINT FUSION LEFT FOOT;  Surgeon: Trula Slade, DPM;  Location: [facility];  Service: Podiatry;  Laterality: Left;  LEFT BLOCK  . hipospedious    . LEFT HEART CATHETERIZATION WITH CORONARY ANGIOGRAM N/A 08/14/2014   Procedure: LEFT HEART CATHETERIZATION WITH CORONARY ANGIOGRAM;  Surgeon: Birdie Riddle, MD;  Location: [facility] CATH LAB;  Service: Cardiovascular;  Laterality: N/A;  . MASS EXCISION Left 01/19/2019   Procedure: EXCISION BENIGN LESION 2.OCM LEFT FOOT;  Surgeon: Trula Slade, DPM;  Location: [facility];  Service: Podiatry;  Laterality: Left;  . SHOULDER ARTHROSCOPY WITH SUBACROMIAL DECOMPRESSION Left 05/03/2020   Procedure: SHOULDER ARTHROSCOPY DEBRIDEMENT PARTIAL ROTATOR CUFF REPAIR, SUBACROMIAL DECOMPRESSION, BICEPS TENOTOMY;  Surgeon: Tania Ade, MD;  Location: [facility] ORS;  Service: Orthopedics;  Laterality: Left;  . SHOULDER SURGERY    . TONSILLECTOMY  1955       Family History  Problem Relation Age of Onset  . Pancreatic cancer Mother   . Aortic aneurysm Maternal Grandmother   . Heart attack Maternal Grandfather   . Diabetes Paternal Grandfather   . CAD Paternal Grandfather     Social History   Tobacco Use  . Smoking status: Never Smoker  . Smokeless tobacco: Never  Used  Vaping Use  . Vaping Use: Never used  Substance Use Topics  . Alcohol use: No  . Drug use: No    Home Medications Prior to Admission medications   Medication Sig Start Date End Date Taking? Authorizing Provider  acetaminophen (TYLENOL) 500 MG tablet Take 1,000 mg by mouth every 6 (six) hours as needed for mild pain or headache.    [provider]  allopurinol (ZYLOPRIM) 100 MG tablet Take 200 mg by mouth daily.  03/26/18   [provider]  aspirin 81 MG tablet Take 81 mg by mouth daily.    [provider]  atorvastatin (LIPITOR) 80 MG tablet TAKE 1 TABLET BY MOUTH DAILY AT 6 PM Patient taking differently: Take 80 mg by mouth daily.  03/01/20   Hilty, Nadean Corwin, MD  cephALEXin (KEFLEX) 500 MG capsule Take 1 capsule (500 mg total) by mouth 3 (three) times daily. 06/01/20   Trula Slade, DPM  COLCRYS 0.6 MG tablet Take 0.6 mg by mouth daily as needed (FOR GOUT).  05/26/14   [provider]  diltiazem (CARDIZEM CD) 120 MG 24 hr capsule Take 2 capsules (240 mg total) by mouth at bedtime. 02/22/20   Hilty, Nadean Corwin, MD  HYDROcodone-acetaminophen (NORCO) 5-325 MG tablet Take 1-2 tablets by mouth every 4 (four) hours as needed for moderate pain (, take with benadryl to avoid rash). 05/03/20   Grier Mitts, PA-C  loratadine (CLARITIN) 10 MG tablet Take 10 mg by mouth daily as needed for allergies.    [provider]  Multiple Vitamins-Minerals (CENTRUM SILVER ULTRA MENS PO) Take 1 tablet by mouth daily.     [provider]  nitroGLYCERIN (NITROSTAT) 0.4 MG SL tablet Place 1 tablet (0.4 mg total) under the tongue every 5 (five) minutes as needed for chest pain. Max 3 doses. 02/24/20   Hilty, Nadean Corwin, MD  omeprazole (PRILOSEC) 40 MG capsule Take 40 mg by mouth every morning. 30-60 minutes prior to breakfast. 06/22/18   [provider]  PARoxetine (PAXIL) 20 MG tablet Take 0.5 tablets (10 mg total) by mouth every other day.  05/31/15   Hilty, Nadean Corwin, MD  Polyethyl Glycol-Propyl Glycol (SYSTANE OP) Apply 1 drop to eye 4 (four) times daily as needed (dry eyes).     [provider]  testosterone cypionate (DEPOTESTOSTERONE CYPIONATE) 200 MG/ML injection Inject 200 mg into the muscle every 14 (fourteen) days. Next injection: 01/17/2019 02/23/18   [provider]  tiZANidine (ZANAFLEX) 4 MG tablet Take 1 tablet (4 mg total) by mouth every 8 (eight) hours as needed for muscle spasms. 05/03/20   Grier Mitts, PA-C  valsartan (DIOVAN) 160 MG tablet Take 1 tablet (160 mg total) by mouth daily. 02/29/20   Hilty, Nadean Corwin, MD  XARELTO 20 MG TABS tablet TAKE 1 TABLET BY MOUTH EVERY DAY WITH SUPPER Patient taking differently: Take 20 mg by mouth daily with supper.  03/01/20   Hilty, Nadean Corwin, MD    Allergies    Codeine, Mupirocin,  Hydrocodone, Neosporin [neomycin-bacitracin zn-polymyx], and Oxycodone  Review of Systems   Review of Systems  Constitutional: Positive for chills and fever.  Skin: Positive for wound.    Physical Exam Updated Vital Signs BP 105/70 (BP Location: Right Arm)   Pulse 76   Temp 98.5 F (36.9 C) (Oral)   Resp 20   Ht 6\' 1"  (1.854 m)   Wt (!) 108.9 kg   SpO2 95%   BMI 31.66 kg/m   Physical Exam Vitals and nursing note reviewed.  Constitutional:      General: He is not in acute distress.    Appearance: He is well-developed. He is not ill-appearing or diaphoretic.  HENT:     Head: Normocephalic and atraumatic.     Right Ear: External ear normal.     Left Ear: External ear normal.     Nose: Nose normal.  Eyes:     General:        Right eye: No discharge.        Left eye: No discharge.  Cardiovascular:     Rate and Rhythm: Normal rate and regular rhythm.     Pulses:          Dorsalis pedis pulses are 2+ on the left side.  Pulmonary:     Effort: Pulmonary effort is normal.  Abdominal:     General: There is no distension.  Musculoskeletal:     Cervical back:  Neck supple.     Comments: Left medial great toe with superficial wound. Not tender. No drainage. To the dorsum of the foot is some faint erythema with warmth.  Skin:    General: Skin is warm and dry.  Neurological:     Mental Status: He is alert.  Psychiatric:        Mood and Affect: Mood is not anxious.       ED Results / Procedures / Treatments   Labs (all labs ordered are listed, but only abnormal results are displayed) Labs Reviewed  CBC WITH DIFFERENTIAL/PLATELET - Abnormal; Notable for the following components:      Result Value   MCV 78.7 (*)    MCH 25.2 (*)    RDW 19.0 (*)    All other components within normal limits  COMPREHENSIVE METABOLIC PANEL - Abnormal; Notable for the following components:   Sodium 133 (*)    Glucose, Bld 115 (*)    Calcium 8.8 (*)    GFR calc non Af Amer 59 (*)    All other components within normal limits    EKG None  Radiology No results found.  Procedures Procedures (including critical care time)  Medications Ordered in ED Medications  vancomycin (VANCOCIN) IVPB 1000 mg/200 mL premix (0 mg Intravenous Stopped 06/01/20 2236)    ED Course  I have reviewed the triage vital signs and the nursing notes.  Pertinent labs & imaging results that were available during my care of the patient were reviewed by me and considered in my medical decision making (see chart for details).    MDM Rules/Calculators/A&P                          Patient's podiatrist sent patient here for a dose of IV antibiotics and evaluation.  Patient's labs have been reviewed and are unremarkable.  Normal WBC.  While this obviously does not exclude infection, he has normal vitals and does not appear to have sepsis.  My suspicion for serious bacterial illness  is low.  Why he has had the fever, I think is reasonable to give dose of IV antibiotics and start the oral antibiotics that were prescribed by podiatry.  X-ray was reportedly reviewed at the podiatrist office  without obvious osteomyelitis.  The wound appears superficial.  Will discharge home with return precautions and podiatry follow up. Final Clinical Impression(s) / ED Diagnoses Final diagnoses:  Cellulitis of right foot    Rx / DC Orders ED Discharge Orders    None       Sherwood Gambler, MD 06/01/20 2359

## 2020-06-04 DIAGNOSIS — S46092D Other injury of muscle(s) and tendon(s) of the rotator cuff of left shoulder, subsequent encounter: Secondary | ICD-10-CM | POA: Diagnosis not present

## 2020-06-04 DIAGNOSIS — Z4789 Encounter for other orthopedic aftercare: Secondary | ICD-10-CM | POA: Diagnosis not present

## 2020-06-07 ENCOUNTER — Ambulatory Visit (INDEPENDENT_AMBULATORY_CARE_PROVIDER_SITE_OTHER): Payer: Medicare Other | Admitting: Podiatry

## 2020-06-07 ENCOUNTER — Other Ambulatory Visit: Payer: Self-pay

## 2020-06-07 DIAGNOSIS — L97501 Non-pressure chronic ulcer of other part of unspecified foot limited to breakdown of skin: Secondary | ICD-10-CM

## 2020-06-07 DIAGNOSIS — L02612 Cutaneous abscess of left foot: Secondary | ICD-10-CM

## 2020-06-07 DIAGNOSIS — L03032 Cellulitis of left toe: Secondary | ICD-10-CM | POA: Diagnosis not present

## 2020-06-07 MED ORDER — SILVER SULFADIAZINE 1 % EX CREA
1.0000 | TOPICAL_CREAM | Freq: Every day | CUTANEOUS | 0 refills | Status: DC
Start: 2020-06-07 — End: 2020-08-17

## 2020-06-08 ENCOUNTER — Other Ambulatory Visit: Payer: Self-pay | Admitting: Podiatry

## 2020-06-08 ENCOUNTER — Telehealth: Payer: Self-pay | Admitting: Podiatry

## 2020-06-08 LAB — WOUND CULTURE
MICRO NUMBER:: 10781469
SPECIMEN QUALITY:: ADEQUATE

## 2020-06-08 MED ORDER — AMPICILLIN 500 MG PO CAPS
500.0000 mg | ORAL_CAPSULE | Freq: Four times a day (QID) | ORAL | 0 refills | Status: DC
Start: 2020-06-08 — End: 2020-06-26

## 2020-06-08 NOTE — Telephone Encounter (Signed)
I informed pt of Dr. Leigh Aurora orders for the ampicillin.

## 2020-06-08 NOTE — Progress Notes (Signed)
Subjective: 74 year old male presents the office today for follow-up evaluation of wound, infection of his left foot.  Last week is in the emergency department he was given dose of IV antibiotics has been on Keflex and overall he feels much better.  We are awaiting the wound care result.  He states the swelling the redness much improved and he denies any systemic concerns including fevers, chills, nausea, vomiting. No acute changes since last appointment, and no other complaints at this time.   Objective: AAO x3, NAD DP/PT pulses palpable bilaterally, CRT less than 3 seconds Hyperkeratotic tissue with central area of skin breakdown on the medial left hallux IPJ there is no drainage or pus from the wound is almost healed.  The cellulitis appears to be mostly resolved at this point there is trace edema.  There is no warmth.  There is no fluctuation crepitation there is no malodor. No pain with calf compression, swelling, warmth, erythema  Assessment: Ulceration left hallux IPJ with improved cellulitis  Plan: -All treatment options discussed with the patient including all alternatives, risks, complications.  -Continue daily dressing change with Silvadene to the wound daily which I prescribed.  Continue course of Keflex when the wound culture.  He can wash the wound daily with soap and water.  Continue offloading, elevation. -Monitor for any clinical signs or symptoms of infection and directed to call the office immediately should any occur or go to the ER. -Patient encouraged to call the office with any questions, concerns, change in symptoms.   Trula Slade DPM

## 2020-06-08 NOTE — Telephone Encounter (Signed)
t called stating that he seen his results on my chart about his toe and wanted to see if Dr. Lilian Coma go ahead call call in the antibiotics that he will need to take pt will leave to be out of town tomorrow wanted to get a jump on it

## 2020-06-12 DIAGNOSIS — Z4789 Encounter for other orthopedic aftercare: Secondary | ICD-10-CM | POA: Diagnosis not present

## 2020-06-12 DIAGNOSIS — S46092D Other injury of muscle(s) and tendon(s) of the rotator cuff of left shoulder, subsequent encounter: Secondary | ICD-10-CM | POA: Diagnosis not present

## 2020-06-19 DIAGNOSIS — Z4789 Encounter for other orthopedic aftercare: Secondary | ICD-10-CM | POA: Diagnosis not present

## 2020-06-19 DIAGNOSIS — S46092D Other injury of muscle(s) and tendon(s) of the rotator cuff of left shoulder, subsequent encounter: Secondary | ICD-10-CM | POA: Diagnosis not present

## 2020-06-21 DIAGNOSIS — S46092D Other injury of muscle(s) and tendon(s) of the rotator cuff of left shoulder, subsequent encounter: Secondary | ICD-10-CM | POA: Diagnosis not present

## 2020-06-21 DIAGNOSIS — Z4789 Encounter for other orthopedic aftercare: Secondary | ICD-10-CM | POA: Diagnosis not present

## 2020-06-22 ENCOUNTER — Other Ambulatory Visit: Payer: Self-pay

## 2020-06-22 ENCOUNTER — Telehealth: Payer: Self-pay | Admitting: Internal Medicine

## 2020-06-22 ENCOUNTER — Telehealth: Payer: Self-pay

## 2020-06-22 DIAGNOSIS — I480 Paroxysmal atrial fibrillation: Secondary | ICD-10-CM

## 2020-06-22 NOTE — Telephone Encounter (Signed)
Please refer to EP for evaluation of anti-arrhythmic drug therapy vs. Cathter ablation given recurrent afib ([place] or [place]). Continue current medications.  Dr Lemmie Evens

## 2020-06-22 NOTE — Telephone Encounter (Signed)
Spoke with pt regarding appt on 06/25/20. Pt stated he did not have any questions or concerns at this time. Pt agreed and confirmed virtual appt.

## 2020-06-22 NOTE — Telephone Encounter (Signed)
Patient called into office regarding feeling like he was back in Afib. Patient states he has felt "groggy" and fatigued the last couple days but assumed it was the heat. Patient checked BP and HR this morning with HR 100 irregular and BP 106/60. Patient states his HR has been in the 60's prior. Patient states that he feels no palpitations, no lightheadedness or dizziness, no chest pain, no nausea. Patient stated he is currently taking his diltiazem and xarelto. Patient wanted to let Dr. [name] know and see if he had any recommendations.

## 2020-06-22 NOTE — Telephone Encounter (Signed)
Notified patient of Dr. Lysbeth Penner recommendation on the patient seeing Dr. Curt Bears or Dr. Rayann Heman from Electrophysiology for the patients recurrent Afib for the evaluation of anti-arrhythmic drug therapy vs. Cath ablation. Patient verbalized understanding, and would like to make that appointment as soon as possible. Referral placed, and I sent a staff message to EP scheduler letting her know referral was placed and patient would like to schedule that appointment as soon as possible. Advised patient to continue current medications per Dr. Rod Mae recommendation. Patient verbalized understanding of instructions.

## 2020-06-22 NOTE — Telephone Encounter (Signed)
Patient c/o Palpitations:  High priority if patient c/o lightheadedness, shortness of breath, or chest pain  1) How long have you had palpitations/irregular HR/ Afib? Are you having the symptoms now? Yes, in afib now. He states he has felt "groggy" the last few days but just checked his BP and HR today.   2) Are you currently experiencing lightheadedness, SOB or CP? no  3) Do you have a history of afib (atrial fibrillation) or irregular heart rhythm? Yes  4) Have you checked your BP or HR? (document readings if available): HR 100 (irregular) BP 106/60  5) Are you experiencing any other symptoms? Groggy - no other symptoms.

## 2020-06-23 NOTE — Telephone Encounter (Signed)
Appointment with Dr. Rayann Heman 06/25/20

## 2020-06-25 ENCOUNTER — Other Ambulatory Visit: Payer: Self-pay

## 2020-06-25 ENCOUNTER — Encounter: Payer: Self-pay | Admitting: *Deleted

## 2020-06-25 ENCOUNTER — Ambulatory Visit: Payer: Medicare Other | Admitting: Podiatry

## 2020-06-25 ENCOUNTER — Encounter: Payer: Self-pay | Admitting: Internal Medicine

## 2020-06-25 ENCOUNTER — Telehealth (INDEPENDENT_AMBULATORY_CARE_PROVIDER_SITE_OTHER): Payer: Medicare Other | Admitting: Internal Medicine

## 2020-06-25 ENCOUNTER — Telehealth: Payer: Self-pay | Admitting: *Deleted

## 2020-06-25 VITALS — BP 120/70 | HR 140 | Ht 73.0 in | Wt 241.0 lb

## 2020-06-25 DIAGNOSIS — I4819 Other persistent atrial fibrillation: Secondary | ICD-10-CM

## 2020-06-25 DIAGNOSIS — D6869 Other thrombophilia: Secondary | ICD-10-CM | POA: Diagnosis not present

## 2020-06-25 DIAGNOSIS — Z4789 Encounter for other orthopedic aftercare: Secondary | ICD-10-CM | POA: Diagnosis not present

## 2020-06-25 DIAGNOSIS — I4891 Unspecified atrial fibrillation: Secondary | ICD-10-CM

## 2020-06-25 DIAGNOSIS — S46092D Other injury of muscle(s) and tendon(s) of the rotator cuff of left shoulder, subsequent encounter: Secondary | ICD-10-CM | POA: Diagnosis not present

## 2020-06-25 MED ORDER — METOPROLOL TARTRATE 50 MG PO TABS
50.0000 mg | ORAL_TABLET | ORAL | 0 refills | Status: DC
Start: 2020-06-25 — End: 2020-07-20

## 2020-06-25 NOTE — Progress Notes (Signed)
Electrophysiology TeleHealth Note   Due to national recommendations of social distancing due to [place] 19, Audio/video telehealth visit is felt to be most appropriate for this patient at this time.  See MyChart message from today for patient consent regarding telehealth for [facility].   Date:  06/25/2020   ID:  [name], DOB 1946-09-20, MRN [id number]  Location: home Provider location: [facility] Evaluation Performed: New patient consult  PCP:  Kathyrn Lass, MD  Cardiologist:  [name], MD  Electrophysiologist:  None   Chief Complaint:  afib  History of Present Illness:    Jose Stone is a 74 y.o. male who presents via audio/video conferencing for a telehealth visit today.   The patient is referred for new consultation regarding afib by Dr [name].  He reports being diagnosed with afib in 2015 after presenting with tachypalpitations to the ED.    He has a h/o congenital heart disease with restrictive VSD.  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  He also had a h/o CAD and is s/p PCI of OM. He has had afib and atrial flutter.  He has found that heat and dehydration are triggers but is mostly unaware of triggers.  He reports symptoms of fatigue and decreased exercise tolerance with his afib.  His exercise tolerance is very reduced. He has had bradycardia with diltiazem. He has had at least two prior cardioversions.  He was most recently cardioverted 4/21.  He returned to afib 10 days ago and remains in afib. He is anticoagulated with xarelto. Today, he denies symptoms of palpitations, chest pain,  lower extremity edema, dizziness, presyncope, syncope, bleeding, or neurologic sequela. The patient is tolerating medications without difficulties and is otherwise without complaint today.   He has a remote history of syncope which was felt to be vagal and due to esophageal reflux.  He was placed on paxil and has not passed out subsequently.    Past  Medical History:  Diagnosis Date  . Aortic root enlargement ([place])   . CAD (coronary artery disease)   . Complication of anesthesia    Hypotensive with general anesth.  . GERD (gastroesophageal reflux disease)   . Hyperlipidemia   . Hypertension   . Melanoma ([place])   . Persistent atrial fibrillation ([place])    Afib,Aflutter  . Typical atrial flutter ([place])   . 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 Ventricular septal defect     Past Surgical History:  Procedure Laterality Date  . CARDIAC CATHETERIZATION    . CARDIOVERSION N/A 02/29/2020   Procedure: CARDIOVERSION;  Surgeon: [name], MD;  [facility] ENDOSCOPY;  Service: Cardiovascular;  Laterality: N/A;  . DISTAL INTERPHALANGEAL JOINT FUSION Left 01/19/2019   Procedure: HALLUX INTERPHANGEAL JOINT FUSION LEFT FOOT;  Surgeon: Trula Slade, DPM;  Location: [facility];  Service: Podiatry;  Laterality: Left;  LEFT BLOCK  . hipospedious    . LEFT HEART CATHETERIZATION WITH CORONARY ANGIOGRAM N/A 08/14/2014   Procedure: LEFT HEART CATHETERIZATION WITH CORONARY ANGIOGRAM;  Surgeon: Birdie Riddle, MD;  Location: [facility] CATH LAB;  Service: Cardiovascular;  Laterality: N/A;  . MASS EXCISION Left 01/19/2019   Procedure: EXCISION BENIGN LESION 2.OCM LEFT FOOT;  Surgeon: Trula Slade, DPM;  Location: [facility];  Service: Podiatry;  Laterality: Left;  . SHOULDER ARTHROSCOPY WITH SUBACROMIAL DECOMPRESSION Left 05/03/2020   Procedure: SHOULDER ARTHROSCOPY DEBRIDEMENT PARTIAL ROTATOR CUFF  REPAIR, SUBACROMIAL DECOMPRESSION, BICEPS TENOTOMY;  Surgeon: Tania Ade, MD;  Location: [facility];  Service: Orthopedics;  Laterality: Left;  . SHOULDER SURGERY    . TONSILLECTOMY  1955    Current Outpatient Medications  Medication Sig Dispense Refill  . acetaminophen (TYLENOL) 500 MG tablet Take 1,000 mg by mouth every 6 (six) hours as needed for mild pain or headache.    . allopurinol (ZYLOPRIM) 100 MG tablet Take 200 mg  by mouth daily.   0  . ampicillin (PRINCIPEN) 500 MG capsule Take 1 capsule (500 mg total) by mouth 4 (four) times daily. 28 capsule 0  . aspirin 81 MG tablet Take 81 mg by mouth daily.    Marland Kitchen atorvastatin (LIPITOR) 80 MG tablet TAKE 1 TABLET BY MOUTH DAILY AT 6 PM 90 tablet 0  . cephALEXin (KEFLEX) 500 MG capsule Take 1 capsule (500 mg total) by mouth 3 (three) times daily. 30 capsule 0  . COLCRYS 0.6 MG tablet Take 0.6 mg by mouth daily as needed (FOR GOUT).     Marland Kitchen diltiazem (CARDIZEM CD) 120 MG 24 hr capsule Take 2 capsules (240 mg total) by mouth at bedtime. 180 capsule 1  . HYDROcodone-acetaminophen (NORCO) 5-325 MG tablet Take 1-2 tablets by mouth every 4 (four) hours as needed for moderate pain (, take with benadryl to avoid rash). 30 tablet 0  . loratadine (CLARITIN) 10 MG tablet Take 10 mg by mouth daily as needed for allergies.    . Multiple Vitamins-Minerals (CENTRUM SILVER ULTRA MENS PO) Take 1 tablet by mouth daily.     . nitroGLYCERIN (NITROSTAT) 0.4 MG SL tablet Place 1 tablet (0.4 mg total) under the tongue every 5 (five) minutes as needed for chest pain. Max 3 doses. 25 tablet 5  . omeprazole (PRILOSEC) 40 MG capsule Take 40 mg by mouth every morning. 30-60 minutes prior to breakfast.  3  . PARoxetine (PAXIL) 20 MG tablet Take 0.5 tablets (10 mg total) by mouth every other day. 30 tablet 0  . Polyethyl Glycol-Propyl Glycol (SYSTANE OP) Apply 1 drop to eye 4 (four) times daily as needed (dry eyes).     . silver sulfADIAZINE (SILVADENE) 1 % cream Apply 1 application topically daily. 50 g 0  . testosterone cypionate (DEPOTESTOSTERONE CYPIONATE) 200 MG/ML injection Inject 200 mg into the muscle every 14 (fourteen) days. Next injection: 01/17/2019  1  . tiZANidine (ZANAFLEX) 4 MG tablet Take 1 tablet (4 mg total) by mouth every 8 (eight) hours as needed for muscle spasms. 30 tablet 1  . valsartan (DIOVAN) 160 MG tablet Take 1 tablet (160 mg total) by mouth daily. 135 tablet 1  . XARELTO 20  MG TABS tablet TAKE 1 TABLET BY MOUTH EVERY DAY WITH SUPPER 90 tablet 3   No current facility-administered medications for this visit.    Allergies:   Codeine, Mupirocin, Hydrocodone, Neosporin [neomycin-bacitracin zn-polymyx], and Oxycodone   Social History:  The patient  reports that he has never smoked. He has never used smokeless tobacco. He reports that he does not drink alcohol and does not use drugs.   Family History:  The patient's family history includes Aortic aneurysm in his maternal grandmother; CAD in his paternal grandfather; Diabetes in his paternal grandfather; Heart attack in his maternal grandfather; Pancreatic cancer in his mother.    ROS:  Please see the history of present illness.   All other systems are personally reviewed and negative.    Exam:    Vital Signs:  BP  120/70   Pulse (!) 140 Comment: Apical  Ht 6\' 1"  (1.854 m)   Wt 241 lb (109.3 kg)   BMI 31.80 kg/m   Well appearing, alert and conversant, regular work of breathing,  good skin color Eyes- anicteric, neuro- grossly intact, skin- no apparent rash or lesions or cyanosis, mouth- oral mucosa is pink   Labs/Other Tests and Data Reviewed:    Recent Labs: 06/01/2020: ALT 25; BUN 22; Creatinine, Ser 1.22; Hemoglobin 14.2; Platelets 236; Potassium 4.1; Sodium 133   Wt Readings from Last 3 Encounters:  06/25/20 241 lb (109.3 kg)  06/01/20 (!) 240 lb (108.9 kg)  05/10/20 241 lb (109.3 kg)     Other studies personally reviewed: Additional studies/ records that were reviewed today include: prior  Echo 2018, Dr Rod Mae notes, prior ekgs  Review of the above records today demonstrates: as above   May 19/2017- typical atrial flutter  ASSESSMENT & PLAN:    1.  Persistent atrial fibrillation/ atrial flutter The patient has symptomatic, recurrent atrial fibrillation and typical atrial flutter. he has failed medical therapy with diltiazem.  Further therapy is limited by bradycardia Chads2vasc score is 3.   he is anticoagulated with xarelto. Therapeutic strategies for afib including medicine (multiple, tikosyn, amiodarone)and ablation were discussed in detail with the patient today. Risk, benefits, and alternatives to EP study and radiofrequency ablation for afib were also discussed in detail today. These risks include but are not limited to stroke, bleeding, vascular damage, tamponade, perforation, damage to the esophagus, lungs, and other structures, pulmonary vein stenosis, worsening renal function, and death. The patient understands these risk and wishes to proceed.  We will therefore proceed with catheter ablation at the next available time.  Carto, ICE, anesthesia are requested for the procedure.  Will also obtain cardiac CT prior to the procedure to exclude LAA thrombus and further evaluate atrial anatomy.  Obtain echo to evaluate afib.  He is very symptomatic with his afib.  We did discuss cardioversion with plans for subsequent ablation.  He would like to proceed with cardioversion.   Patient Risk:  after full review of this patients clinical status, I feel that they are at moderate risk at this time.  2. OSA Uses CPAP  3. CAD No ischemic symptoms No changes  4. Vagal syncope Doing well with paxil    Medication instructions morning of: The patient should hold ALL medications the morning of the procedure   Discharge: Our plan will be to discharge the patient same day after a period of observation   Today, I have spent 25 minutes with the patient with telehealth technology discussing afib .    SignedThompson Grayer MD, [facility] 06/25/2020 11:07 AM   [facility] [address] [place] [address] [phone] (office) [phone] (fax)

## 2020-06-25 NOTE — Telephone Encounter (Signed)
Spoke to wife about scheduling cardioversion this week and availability. Advised I would call back with schedule time.

## 2020-06-25 NOTE — Telephone Encounter (Signed)
-----   Message from [name], MD sent at 06/25/2020 11:17 AM EDT ----- Schedule cardioversion this week  Also needs an echo   Then..... afib ablation C/I/A Cardiac CT

## 2020-06-25 NOTE — H&P (View-Only) (Signed)
Electrophysiology TeleHealth Note   Due to national recommendations of social distancing due to [place] 19, Audio/video telehealth visit is felt to be most appropriate for this patient at this time.  See MyChart message from today for patient consent regarding telehealth for [facility].   Date:  06/25/2020   ID:  Jose Stone, DOB 03-Jan-1946, MRN [id number]  Location: home Provider location: [place] Evaluation Performed: New patient consult  PCP:  Kathyrn Lass, MD  Cardiologist:  [name], MD  Electrophysiologist:  None   Chief Complaint:  afib  History of Present Illness:    Jose Stone is a 74 y.o. male who presents via audio/video conferencing for a telehealth visit today.   The patient is referred for new consultation regarding afib by Dr [name].  He reports being diagnosed with afib in 2015 after presenting with tachypalpitations to the ED.    He has a h/o congenital heart disease with restrictive VSD.  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  He also had a h/o CAD and is s/p PCI of OM. He has had afib and atrial flutter.  He has found that heat and dehydration are triggers but is mostly unaware of triggers.  He reports symptoms of fatigue and decreased exercise tolerance with his afib.  His exercise tolerance is very reduced. He has had bradycardia with diltiazem. He has had at least two prior cardioversions.  He was most recently cardioverted 4/21.  He returned to afib 10 days ago and remains in afib. He is anticoagulated with xarelto. Today, he denies symptoms of palpitations, chest pain,  lower extremity edema, dizziness, presyncope, syncope, bleeding, or neurologic sequela. The patient is tolerating medications without difficulties and is otherwise without complaint today.   He has a remote history of syncope which was felt to be vagal and due to esophageal reflux.  He was placed on paxil and has not passed out subsequently.    Past  Medical History:  Diagnosis Date  . Aortic root enlargement ([place])   . CAD (coronary artery disease)   . Complication of anesthesia    Hypotensive with general anesth.  . GERD (gastroesophageal reflux disease)   . Hyperlipidemia   . Hypertension   . Melanoma ([place])   . Persistent atrial fibrillation ([place])    Afib,Aflutter  . Typical atrial flutter ([place])   . 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 Ventricular septal defect     Past Surgical History:  Procedure Laterality Date  . CARDIAC CATHETERIZATION    . CARDIOVERSION N/A 02/29/2020   Procedure: CARDIOVERSION;  Surgeon: [name], MD;  [facility]: [facility] ENDOSCOPY;  Service: Cardiovascular;  Laterality: N/A;  . DISTAL INTERPHALANGEAL JOINT FUSION Left 01/19/2019   Procedure: HALLUX INTERPHANGEAL JOINT FUSION LEFT FOOT;  Surgeon: Trula Slade, DPM;  Location: [place];  Service: Podiatry;  Laterality: Left;  LEFT BLOCK  . hipospedious    . LEFT HEART CATHETERIZATION WITH CORONARY ANGIOGRAM N/A 08/14/2014   Procedure: LEFT HEART CATHETERIZATION WITH CORONARY ANGIOGRAM;  Surgeon: Birdie Riddle, MD;  Location: [place] CATH LAB;  Service: Cardiovascular;  Laterality: N/A;  . MASS EXCISION Left 01/19/2019   Procedure: EXCISION BENIGN LESION 2.OCM LEFT FOOT;  Surgeon: Trula Slade, DPM;  Location: [place];  Service: Podiatry;  Laterality: Left;  . SHOULDER ARTHROSCOPY WITH SUBACROMIAL DECOMPRESSION Left 05/03/2020   Procedure: SHOULDER ARTHROSCOPY DEBRIDEMENT PARTIAL ROTATOR CUFF  REPAIR, SUBACROMIAL DECOMPRESSION, BICEPS TENOTOMY;  Surgeon: Tania Ade, MD;  Location: [facility];  Service: Orthopedics;  Laterality: Left;  . SHOULDER SURGERY    . TONSILLECTOMY  1955    Current Outpatient Medications  Medication Sig Dispense Refill  . acetaminophen (TYLENOL) 500 MG tablet Take 1,000 mg by mouth every 6 (six) hours as needed for mild pain or headache.    . allopurinol (ZYLOPRIM) 100 MG tablet Take 200 mg  by mouth daily.   0  . ampicillin (PRINCIPEN) 500 MG capsule Take 1 capsule (500 mg total) by mouth 4 (four) times daily. 28 capsule 0  . aspirin 81 MG tablet Take 81 mg by mouth daily.    Marland Kitchen atorvastatin (LIPITOR) 80 MG tablet TAKE 1 TABLET BY MOUTH DAILY AT 6 PM 90 tablet 0  . cephALEXin (KEFLEX) 500 MG capsule Take 1 capsule (500 mg total) by mouth 3 (three) times daily. 30 capsule 0  . COLCRYS 0.6 MG tablet Take 0.6 mg by mouth daily as needed (FOR GOUT).     Marland Kitchen diltiazem (CARDIZEM CD) 120 MG 24 hr capsule Take 2 capsules (240 mg total) by mouth at bedtime. 180 capsule 1  . HYDROcodone-acetaminophen (NORCO) 5-325 MG tablet Take 1-2 tablets by mouth every 4 (four) hours as needed for moderate pain (, take with benadryl to avoid rash). 30 tablet 0  . loratadine (CLARITIN) 10 MG tablet Take 10 mg by mouth daily as needed for allergies.    . Multiple Vitamins-Minerals (CENTRUM SILVER ULTRA MENS PO) Take 1 tablet by mouth daily.     . nitroGLYCERIN (NITROSTAT) 0.4 MG SL tablet Place 1 tablet (0.4 mg total) under the tongue every 5 (five) minutes as needed for chest pain. Max 3 doses. 25 tablet 5  . omeprazole (PRILOSEC) 40 MG capsule Take 40 mg by mouth every morning. 30-60 minutes prior to breakfast.  3  . PARoxetine (PAXIL) 20 MG tablet Take 0.5 tablets (10 mg total) by mouth every other day. 30 tablet 0  . Polyethyl Glycol-Propyl Glycol (SYSTANE OP) Apply 1 drop to eye 4 (four) times daily as needed (dry eyes).     . silver sulfADIAZINE (SILVADENE) 1 % cream Apply 1 application topically daily. 50 g 0  . testosterone cypionate (DEPOTESTOSTERONE CYPIONATE) 200 MG/ML injection Inject 200 mg into the muscle every 14 (fourteen) days. Next injection: 01/17/2019  1  . tiZANidine (ZANAFLEX) 4 MG tablet Take 1 tablet (4 mg total) by mouth every 8 (eight) hours as needed for muscle spasms. 30 tablet 1  . valsartan (DIOVAN) 160 MG tablet Take 1 tablet (160 mg total) by mouth daily. 135 tablet 1  . XARELTO 20  MG TABS tablet TAKE 1 TABLET BY MOUTH EVERY DAY WITH SUPPER 90 tablet 3   No current facility-administered medications for this visit.    Allergies:   Codeine, Mupirocin, Hydrocodone, Neosporin [neomycin-bacitracin zn-polymyx], and Oxycodone   Social History:  The patient  reports that he has never smoked. He has never used smokeless tobacco. He reports that he does not drink alcohol and does not use drugs.   Family History:  The patient's family history includes Aortic aneurysm in his maternal grandmother; CAD in his paternal grandfather; Diabetes in his paternal grandfather; Heart attack in his maternal grandfather; Pancreatic cancer in his mother.    ROS:  Please see the history of present illness.   All other systems are personally reviewed and negative.    Exam:    Vital Signs:  BP  120/70   Pulse (!) 140 Comment: Apical  Ht 6\' 1"  (1.854 m)   Wt 241 lb (109.3 kg)   BMI 31.80 kg/m   Well appearing, alert and conversant, regular work of breathing,  good skin color Eyes- anicteric, neuro- grossly intact, skin- no apparent rash or lesions or cyanosis, mouth- oral mucosa is pink   Labs/Other Tests and Data Reviewed:    Recent Labs: 06/01/2020: ALT 25; BUN 22; Creatinine, Ser 1.22; Hemoglobin 14.2; Platelets 236; Potassium 4.1; Sodium 133   Wt Readings from Last 3 Encounters:  06/25/20 241 lb (109.3 kg)  06/01/20 (!) 240 lb (108.9 kg)  05/10/20 241 lb (109.3 kg)     Other studies personally reviewed: Additional studies/ records that were reviewed today include: prior  Echo 2018, Dr Rod Mae notes, prior ekgs  Review of the above records today demonstrates: as above   May 19/2017- typical atrial flutter  ASSESSMENT & PLAN:    1.  Persistent atrial fibrillation/ atrial flutter The patient has symptomatic, recurrent atrial fibrillation and typical atrial flutter. he has failed medical therapy with diltiazem.  Further therapy is limited by bradycardia Chads2vasc score is 3.   he is anticoagulated with xarelto. Therapeutic strategies for afib including medicine (multiple, tikosyn, amiodarone)and ablation were discussed in detail with the patient today. Risk, benefits, and alternatives to EP study and radiofrequency ablation for afib were also discussed in detail today. These risks include but are not limited to stroke, bleeding, vascular damage, tamponade, perforation, damage to the esophagus, lungs, and other structures, pulmonary vein stenosis, worsening renal function, and death. The patient understands these risk and wishes to proceed.  We will therefore proceed with catheter ablation at the next available time.  Carto, ICE, anesthesia are requested for the procedure.  Will also obtain cardiac CT prior to the procedure to exclude LAA thrombus and further evaluate atrial anatomy.  Obtain echo to evaluate afib.  He is very symptomatic with his afib.  We did discuss cardioversion with plans for subsequent ablation.  He would like to proceed with cardioversion.   Patient Risk:  after full review of this patients clinical status, I feel that they are at moderate risk at this time.  2. OSA Uses CPAP  3. CAD No ischemic symptoms No changes  4. Vagal syncope Doing well with paxil    Medication instructions morning of: The patient should hold ALL medications the morning of the procedure   Discharge: Our plan will be to discharge the patient same day after a period of observation   Today, I have spent 25 minutes with the patient with telehealth technology discussing afib .    SignedThompson Grayer MD, [facility] 06/25/2020 11:07 AM   [facility] [address] [place]  [address] [phone] (office) [phone] (fax)

## 2020-06-25 NOTE — H&P (View-Only) (Signed)
Electrophysiology TeleHealth Note   Due to national recommendations of social distancing due to [place] 19, Audio/video telehealth visit is felt to be most appropriate for this patient at this time.  See MyChart message from today for patient consent regarding telehealth for [facility].   Date:  06/25/2020   ID:  Jose Stone, DOB 1945-11-05, MRN [id number]  Location: home Provider location: [place] Evaluation Performed: New patient consult  PCP:  Kathyrn Lass, MD  Cardiologist:  [name], MD  Electrophysiologist:  None   Chief Complaint:  afib  History of Present Illness:    Jose Stone is a 74 y.o. male who presents via audio/video conferencing for a telehealth visit today.   The patient is referred for new consultation regarding afib by Dr [name].  He reports being diagnosed with afib in 2015 after presenting with tachypalpitations to the ED.    He has a h/o congenital heart disease with restrictive VSD.  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  He also had a h/o CAD and is s/p PCI of OM. He has had afib and atrial flutter.  He has found that heat and dehydration are triggers but is mostly unaware of triggers.  He reports symptoms of fatigue and decreased exercise tolerance with his afib.  His exercise tolerance is very reduced. He has had bradycardia with diltiazem. He has had at least two prior cardioversions.  He was most recently cardioverted 4/21.  He returned to afib 10 days ago and remains in afib. He is anticoagulated with xarelto. Today, he denies symptoms of palpitations, chest pain,  lower extremity edema, dizziness, presyncope, syncope, bleeding, or neurologic sequela. The patient is tolerating medications without difficulties and is otherwise without complaint today.   He has a remote history of syncope which was felt to be vagal and due to esophageal reflux.  He was placed on paxil and has not passed out subsequently.    Past  Medical History:  Diagnosis Date  . Aortic root enlargement ([place])   . CAD (coronary artery disease)   . Complication of anesthesia    Hypotensive with general anesth.  . GERD (gastroesophageal reflux disease)   . Hyperlipidemia   . Hypertension   . Melanoma ([place])   . Persistent atrial fibrillation ([place])    Afib,Aflutter  . Typical atrial flutter ([place])   . 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 Ventricular septal defect     Past Surgical History:  Procedure Laterality Date  . CARDIAC CATHETERIZATION    . CARDIOVERSION N/A 02/29/2020   Procedure: CARDIOVERSION;  Surgeon: [name], MD;  Location: [facility] ENDOSCOPY;  Service: Cardiovascular;  Laterality: N/A;  . DISTAL INTERPHALANGEAL JOINT FUSION Left 01/19/2019   Procedure: HALLUX INTERPHANGEAL JOINT FUSION LEFT FOOT;  Surgeon: Trula Slade, DPM;  Location: [place];  Service: Podiatry;  Laterality: Left;  LEFT BLOCK  . hipospedious    . LEFT HEART CATHETERIZATION WITH CORONARY ANGIOGRAM N/A 08/14/2014   Procedure: LEFT HEART CATHETERIZATION WITH CORONARY ANGIOGRAM;  Surgeon: Birdie Riddle, MD;  Location: [place] CATH LAB;  Service: Cardiovascular;  Laterality: N/A;  . MASS EXCISION Left 01/19/2019   Procedure: EXCISION BENIGN LESION 2.OCM LEFT FOOT;  Surgeon: Trula Slade, DPM;  Location: [place];  Service: Podiatry;  Laterality: Left;  . SHOULDER ARTHROSCOPY WITH SUBACROMIAL DECOMPRESSION Left 05/03/2020   Procedure: SHOULDER ARTHROSCOPY DEBRIDEMENT PARTIAL ROTATOR CUFF  REPAIR, SUBACROMIAL DECOMPRESSION, BICEPS TENOTOMY;  Surgeon: Tania Ade, MD;  Location: [facility];  Service: Orthopedics;  Laterality: Left;  . SHOULDER SURGERY    . TONSILLECTOMY  1955    Current Outpatient Medications  Medication Sig Dispense Refill  . acetaminophen (TYLENOL) 500 MG tablet Take 1,000 mg by mouth every 6 (six) hours as needed for mild pain or headache.    . allopurinol (ZYLOPRIM) 100 MG tablet Take 200 mg  by mouth daily.   0  . ampicillin (PRINCIPEN) 500 MG capsule Take 1 capsule (500 mg total) by mouth 4 (four) times daily. 28 capsule 0  . aspirin 81 MG tablet Take 81 mg by mouth daily.    Marland Kitchen atorvastatin (LIPITOR) 80 MG tablet TAKE 1 TABLET BY MOUTH DAILY AT 6 PM 90 tablet 0  . cephALEXin (KEFLEX) 500 MG capsule Take 1 capsule (500 mg total) by mouth 3 (three) times daily. 30 capsule 0  . COLCRYS 0.6 MG tablet Take 0.6 mg by mouth daily as needed (FOR GOUT).     Marland Kitchen diltiazem (CARDIZEM CD) 120 MG 24 hr capsule Take 2 capsules (240 mg total) by mouth at bedtime. 180 capsule 1  . HYDROcodone-acetaminophen (NORCO) 5-325 MG tablet Take 1-2 tablets by mouth every 4 (four) hours as needed for moderate pain (, take with benadryl to avoid rash). 30 tablet 0  . loratadine (CLARITIN) 10 MG tablet Take 10 mg by mouth daily as needed for allergies.    . Multiple Vitamins-Minerals (CENTRUM SILVER ULTRA MENS PO) Take 1 tablet by mouth daily.     . nitroGLYCERIN (NITROSTAT) 0.4 MG SL tablet Place 1 tablet (0.4 mg total) under the tongue every 5 (five) minutes as needed for chest pain. Max 3 doses. 25 tablet 5  . omeprazole (PRILOSEC) 40 MG capsule Take 40 mg by mouth every morning. 30-60 minutes prior to breakfast.  3  . PARoxetine (PAXIL) 20 MG tablet Take 0.5 tablets (10 mg total) by mouth every other day. 30 tablet 0  . Polyethyl Glycol-Propyl Glycol (SYSTANE OP) Apply 1 drop to eye 4 (four) times daily as needed (dry eyes).     . silver sulfADIAZINE (SILVADENE) 1 % cream Apply 1 application topically daily. 50 g 0  . testosterone cypionate (DEPOTESTOSTERONE CYPIONATE) 200 MG/ML injection Inject 200 mg into the muscle every 14 (fourteen) days. Next injection: 01/17/2019  1  . tiZANidine (ZANAFLEX) 4 MG tablet Take 1 tablet (4 mg total) by mouth every 8 (eight) hours as needed for muscle spasms. 30 tablet 1  . valsartan (DIOVAN) 160 MG tablet Take 1 tablet (160 mg total) by mouth daily. 135 tablet 1  . XARELTO 20  MG TABS tablet TAKE 1 TABLET BY MOUTH EVERY DAY WITH SUPPER 90 tablet 3   No current facility-administered medications for this visit.    Allergies:   Codeine, Mupirocin, Hydrocodone, Neosporin [neomycin-bacitracin zn-polymyx], and Oxycodone   Social History:  The patient  reports that he has never smoked. He has never used smokeless tobacco. He reports that he does not drink alcohol and does not use drugs.   Family History:  The patient's family history includes Aortic aneurysm in his maternal grandmother; CAD in his paternal grandfather; Diabetes in his paternal grandfather; Heart attack in his maternal grandfather; Pancreatic cancer in his mother.    ROS:  Please see the history of present illness.   All other systems are personally reviewed and negative.    Exam:    Vital Signs:  BP  120/70   Pulse (!) 140 Comment: Apical  Ht 6\' 1"  (1.854 m)   Wt 241 lb (109.3 kg)   BMI 31.80 kg/m   Well appearing, alert and conversant, regular work of breathing,  good skin color Eyes- anicteric, neuro- grossly intact, skin- no apparent rash or lesions or cyanosis, mouth- oral mucosa is pink   Labs/Other Tests and Data Reviewed:    Recent Labs: 06/01/2020: ALT 25; BUN 22; Creatinine, Ser 1.22; Hemoglobin 14.2; Platelets 236; Potassium 4.1; Sodium 133   Wt Readings from Last 3 Encounters:  06/25/20 241 lb (109.3 kg)  06/01/20 (!) 240 lb (108.9 kg)  05/10/20 241 lb (109.3 kg)     Other studies personally reviewed: Additional studies/ records that were reviewed today include: prior  Echo 2018, Dr Rod Mae notes, prior ekgs  Review of the above records today demonstrates: as above   May 19/2017- typical atrial flutter  ASSESSMENT & PLAN:    1.  Persistent atrial fibrillation/ atrial flutter The patient has symptomatic, recurrent atrial fibrillation and typical atrial flutter. he has failed medical therapy with diltiazem.  Further therapy is limited by bradycardia Chads2vasc score is 3.   he is anticoagulated with xarelto. Therapeutic strategies for afib including medicine (multiple, tikosyn, amiodarone)and ablation were discussed in detail with the patient today. Risk, benefits, and alternatives to EP study and radiofrequency ablation for afib were also discussed in detail today. These risks include but are not limited to stroke, bleeding, vascular damage, tamponade, perforation, damage to the esophagus, lungs, and other structures, pulmonary vein stenosis, worsening renal function, and death. The patient understands these risk and wishes to proceed.  We will therefore proceed with catheter ablation at the next available time.  Carto, ICE, anesthesia are requested for the procedure.  Will also obtain cardiac CT prior to the procedure to exclude LAA thrombus and further evaluate atrial anatomy.  Obtain echo to evaluate afib.  He is very symptomatic with his afib.  We did discuss cardioversion with plans for subsequent ablation.  He would like to proceed with cardioversion.   Patient Risk:  after full review of this patients clinical status, I feel that they are at moderate risk at this time.  2. OSA Uses CPAP  3. CAD No ischemic symptoms No changes  4. Vagal syncope Doing well with paxil    Medication instructions morning of: The patient should hold ALL medications the morning of the procedure   Discharge: Our plan will be to discharge the patient same day after a period of observation   Today, I have spent 25 minutes with the patient with telehealth technology discussing afib .    SignedThompson Grayer MD, [facility] 06/25/2020 11:07 AM   [facility] [address] [place]  [address] [phone] (office) [phone] (fax)

## 2020-06-25 NOTE — Telephone Encounter (Signed)
Spoke to patient about cardioversion schedule and covid testing. Verbalized understanding   Scheduled ablation, labs, and testing. Patient aware.    Work up completed

## 2020-06-26 ENCOUNTER — Other Ambulatory Visit (HOSPITAL_COMMUNITY)
Admission: RE | Admit: 2020-06-26 | Discharge: 2020-06-26 | Disposition: A | Discharge status: Home / Self Care | Payer: Medicare Other | Source: Ambulatory Visit | Attending: Cardiovascular Disease | Admitting: Cardiovascular Disease

## 2020-06-26 DIAGNOSIS — R946 Abnormal results of thyroid function studies: Secondary | ICD-10-CM | POA: Diagnosis not present

## 2020-06-26 DIAGNOSIS — S22000A Wedge compression fracture of unspecified thoracic vertebra, initial encounter for closed fracture: Secondary | ICD-10-CM | POA: Diagnosis not present

## 2020-06-26 DIAGNOSIS — K219 Gastro-esophageal reflux disease without esophagitis: Secondary | ICD-10-CM | POA: Diagnosis not present

## 2020-06-26 DIAGNOSIS — N529 Male erectile dysfunction, unspecified: Secondary | ICD-10-CM | POA: Diagnosis not present

## 2020-06-26 DIAGNOSIS — Z20822 Contact with and (suspected) exposure to covid-19: Secondary | ICD-10-CM | POA: Diagnosis not present

## 2020-06-26 DIAGNOSIS — E785 Hyperlipidemia, unspecified: Secondary | ICD-10-CM | POA: Diagnosis not present

## 2020-06-26 DIAGNOSIS — R7301 Impaired fasting glucose: Secondary | ICD-10-CM | POA: Diagnosis not present

## 2020-06-26 DIAGNOSIS — M1A9XX0 Chronic gout, unspecified, without tophus (tophi): Secondary | ICD-10-CM | POA: Diagnosis not present

## 2020-06-26 DIAGNOSIS — R55 Syncope and collapse: Secondary | ICD-10-CM | POA: Diagnosis not present

## 2020-06-26 DIAGNOSIS — Z01812 Encounter for preprocedural laboratory examination: Secondary | ICD-10-CM | POA: Insufficient documentation

## 2020-06-26 DIAGNOSIS — G4733 Obstructive sleep apnea (adult) (pediatric): Secondary | ICD-10-CM | POA: Diagnosis not present

## 2020-06-26 DIAGNOSIS — I4891 Unspecified atrial fibrillation: Secondary | ICD-10-CM | POA: Diagnosis not present

## 2020-06-26 DIAGNOSIS — M835 Other drug-induced osteomalacia in adults: Secondary | ICD-10-CM | POA: Diagnosis not present

## 2020-06-26 DIAGNOSIS — Z0000 Encounter for general adult medical examination without abnormal findings: Secondary | ICD-10-CM | POA: Diagnosis not present

## 2020-06-26 LAB — SARS CORONAVIRUS 2 (TAT 6-24 HRS): SARS Coronavirus 2: NEGATIVE

## 2020-06-28 ENCOUNTER — Ambulatory Visit (HOSPITAL_COMMUNITY): Payer: Medicare Other | Admitting: Anesthesiology

## 2020-06-28 ENCOUNTER — Other Ambulatory Visit: Payer: Self-pay

## 2020-06-28 ENCOUNTER — Ambulatory Visit (HOSPITAL_BASED_OUTPATIENT_CLINIC_OR_DEPARTMENT_OTHER): Payer: Medicare Other

## 2020-06-28 ENCOUNTER — Encounter (HOSPITAL_COMMUNITY): Payer: Self-pay | Admitting: Cardiovascular Disease

## 2020-06-28 ENCOUNTER — Encounter (HOSPITAL_COMMUNITY)
Admission: RE | Disposition: A | Discharge status: Home / Self Care | Payer: Medicare Other | Source: Home / Self Care | Attending: Cardiovascular Disease

## 2020-06-28 ENCOUNTER — Ambulatory Visit: Payer: Medicare Other | Admitting: Podiatry

## 2020-06-28 ENCOUNTER — Other Ambulatory Visit: Payer: Self-pay | Admitting: Family Medicine

## 2020-06-28 ENCOUNTER — Ambulatory Visit (HOSPITAL_COMMUNITY)
Admission: RE | Admit: 2020-06-28 | Discharge: 2020-06-28 | Disposition: A | Discharge status: Home / Self Care | Payer: Medicare Other | Attending: Cardiovascular Disease | Admitting: Cardiovascular Disease

## 2020-06-28 DIAGNOSIS — I483 Typical atrial flutter: Secondary | ICD-10-CM | POA: Insufficient documentation

## 2020-06-28 DIAGNOSIS — Z955 Presence of coronary angioplasty implant and graft: Secondary | ICD-10-CM | POA: Insufficient documentation

## 2020-06-28 DIAGNOSIS — I2510 Atherosclerotic heart disease of native coronary artery without angina pectoris: Secondary | ICD-10-CM | POA: Insufficient documentation

## 2020-06-28 DIAGNOSIS — R55 Syncope and collapse: Secondary | ICD-10-CM | POA: Diagnosis not present

## 2020-06-28 DIAGNOSIS — Q210 Ventricular septal defect: Secondary | ICD-10-CM | POA: Diagnosis not present

## 2020-06-28 DIAGNOSIS — K219 Gastro-esophageal reflux disease without esophagitis: Secondary | ICD-10-CM | POA: Insufficient documentation

## 2020-06-28 DIAGNOSIS — I10 Essential (primary) hypertension: Secondary | ICD-10-CM | POA: Insufficient documentation

## 2020-06-28 DIAGNOSIS — I480 Paroxysmal atrial fibrillation: Secondary | ICD-10-CM | POA: Diagnosis not present

## 2020-06-28 DIAGNOSIS — Z79899 Other long term (current) drug therapy: Secondary | ICD-10-CM | POA: Diagnosis not present

## 2020-06-28 DIAGNOSIS — G4733 Obstructive sleep apnea (adult) (pediatric): Secondary | ICD-10-CM | POA: Diagnosis not present

## 2020-06-28 DIAGNOSIS — E785 Hyperlipidemia, unspecified: Secondary | ICD-10-CM | POA: Insufficient documentation

## 2020-06-28 DIAGNOSIS — Z7901 Long term (current) use of anticoagulants: Secondary | ICD-10-CM | POA: Insufficient documentation

## 2020-06-28 DIAGNOSIS — I4819 Other persistent atrial fibrillation: Secondary | ICD-10-CM

## 2020-06-28 DIAGNOSIS — S22000A Wedge compression fracture of unspecified thoracic vertebra, initial encounter for closed fracture: Secondary | ICD-10-CM

## 2020-06-28 DIAGNOSIS — Z7982 Long term (current) use of aspirin: Secondary | ICD-10-CM | POA: Diagnosis not present

## 2020-06-28 HISTORY — PX: CARDIOVERSION: SHX1299

## 2020-06-28 LAB — ECHOCARDIOGRAM COMPLETE
Area-P 1/2: 4.49 cm2
Height: 73 in
S' Lateral: 3.5 cm
Weight: 3855.4 oz

## 2020-06-28 SURGERY — CARDIOVERSION
Anesthesia: General

## 2020-06-28 MED ORDER — PERFLUTREN LIPID MICROSPHERE
1.0000 mL | INTRAVENOUS | Status: AC | PRN
  Administered 2020-06-28: 1 mL via INTRAVENOUS

## 2020-06-28 MED ORDER — PROPOFOL 10 MG/ML IV BOLUS
INTRAVENOUS | Status: DC | PRN
  Administered 2020-06-28: 60 mg via INTRAVENOUS

## 2020-06-28 MED ORDER — LIDOCAINE 2% (20 MG/ML) 5 ML SYRINGE
INTRAMUSCULAR | Status: DC | PRN
  Administered 2020-06-28: 60 mg via INTRAVENOUS

## 2020-06-28 NOTE — CV Procedure (Signed)
    Cardioversion Note  Jose Stone [id number] 1946/01/11  Procedure: DC Cardioversion Indications: atrial fib   Procedure Details Consent: Obtained Time Out: Verified patient identification, verified procedure, site/side was marked, verified correct patient position, special equipment/implants available, Radiology Safety Procedures followed,  medications/allergies/relevent history reviewed, required imaging and test results available.  Performed  The patient has been on adequate anticoagulation.  The patient received IV Lidocaine 60 mg IV followed by Propofol 60 mg IV  for sedation.  Synchronous cardioversion was performed at  200  joules.  The cardioversion was successful.     Complications: No apparent complications Patient did tolerate procedure well.   Jose Stone, Jose Stone., MD, [facility] 06/28/2020, 8:53 AM

## 2020-06-28 NOTE — Transfer of Care (Signed)
Immediate Anesthesia Transfer of Care Note  Patient: Jose Stone  Procedure(s) Performed: CARDIOVERSION (N/A )  Patient Location: Endoscopy Unit  Anesthesia Type:General  Level of Consciousness: drowsy and patient cooperative  Airway & Oxygen Therapy: Patient Spontanous Breathing  Post-op Assessment: Report given to RN and Post -op Vital signs reviewed and stable  Post vital signs: Reviewed and stable  Last Vitals:  Vitals Value Taken Time  BP    Temp    Pulse    Resp    SpO2      Last Pain: There were no vitals filed for this visit.       Complications: No complications documented.

## 2020-06-28 NOTE — Anesthesia Preprocedure Evaluation (Addendum)
Anesthesia Evaluation  Patient identified by MRN, date of birth, ID band Patient awake    Reviewed: Allergy & Precautions, NPO status , Patient's Chart, lab work & pertinent test results, reviewed documented beta blocker date and time   History of Anesthesia Complications Negative for: history of anesthetic complications  Airway Mallampati: II  TM Distance: >3 FB Neck ROM: Full    Dental no notable dental hx. (+) Teeth Intact, Dental Advisory Given   Pulmonary Continuous Positive Airway Pressure Ventilation ,    Pulmonary exam normal        Cardiovascular hypertension, Pt. on medications and Pt. on home beta blockers + CAD and + Cardiac Stents (2015)  Normal cardiovascular exam+ dysrhythmias (on Xarelto) Atrial Fibrillation   Echo 10/01/2017: moderate LVH, EF 03-00%, grade 1 diastolicdysfunction, restrictive VSD noted - mean gradient 52mmHg, peak gradient 77 mmHg   Incomplete RBBB   Neuro/Psych negative neurological ROS  negative psych ROS   GI/Hepatic Neg liver ROS, GERD  Medicated and Controlled,  Endo/Other  Obesity BMI 32  Renal/GU negative Renal ROS  negative genitourinary   Musculoskeletal left shoulder rotator cuff tear   Abdominal Normal abdominal exam  (+)   Peds  Hematology negative hematology ROS (+)   Anesthesia Other Findings Day of surgery medications reviewed with patient.  Reproductive/Obstetrics negative OB ROS                            Anesthesia Physical Anesthesia Plan  ASA: III  Anesthesia Plan: General   Post-op Pain Management:    Induction: Intravenous  PONV Risk Score and Plan: Propofol infusion, TIVA and Treatment may vary due to age or medical condition  Airway Management Planned: Natural Airway and Mask  Additional Equipment: None  Intra-op Plan:   Post-operative Plan:   Informed Consent: I have reviewed the patients History and Physical, chart,  labs and discussed the procedure including the risks, benefits and alternatives for the proposed anesthesia with the patient or authorized representative who has indicated his/her understanding and acceptance.     Dental advisory given  Plan Discussed with: CRNA  Anesthesia Plan Comments:        Anesthesia Quick Evaluation

## 2020-06-28 NOTE — Interval H&P Note (Signed)
History and Physical Interval Note:  06/28/2020 8:30 AM  Jose Stone  has presented today for surgery, with the diagnosis of AFIB.  The various methods of treatment have been discussed with the patient and family. After consideration of risks, benefits and other options for treatment, the patient has consented to  Procedure(s): CARDIOVERSION (N/A) as a surgical intervention.  The patient's history has been reviewed, patient examined, no change in status, stable for surgery.  I have reviewed the patient's chart and labs.  Questions were answered to the patient's satisfaction.     [name]

## 2020-06-28 NOTE — Anesthesia Postprocedure Evaluation (Signed)
Anesthesia Post Note  Patient: Jose Stone  Procedure(s) Performed: CARDIOVERSION (N/A )     Patient location during evaluation: PACU Anesthesia Type: General Level of consciousness: awake and alert, oriented and patient cooperative Pain management: pain level controlled Vital Signs Assessment: post-procedure vital signs reviewed and stable Respiratory status: spontaneous breathing, nonlabored ventilation and respiratory function stable Cardiovascular status: blood pressure returned to baseline and stable Postop Assessment: no apparent nausea or vomiting Anesthetic complications: no   No complications documented.  Last Vitals:  Vitals:   06/28/20 0856 06/28/20 0900  BP: 120/77 129/78  Pulse: 76 71  Resp: 16 12  SpO2: 97% 100%    Last Pain:  Vitals:   06/28/20 0856  PainSc: 0-No pain                 Pervis Hocking

## 2020-06-28 NOTE — Discharge Instructions (Signed)
Electrical Cardioversion Electrical cardioversion is the delivery of a jolt of electricity to restore a normal rhythm to the heart. A rhythm that is too fast or is not regular keeps the heart from pumping well. In this procedure, sticky patches or metal paddles are placed on the chest to deliver electricity to the heart from a device. This procedure may be done in an emergency if:  There is low or no blood pressure as a result of the heart rhythm.  Normal rhythm must be restored as fast as possible to protect the brain and heart from further damage.  It may save a life. This may also be a scheduled procedure for irregular or fast heart rhythms that are not immediately life-threatening. Tell a health care provider about:  Any allergies you have.  All medicines you are taking, including vitamins, herbs, eye drops, creams, and over-the-counter medicines.  Any problems you or family members have had with anesthetic medicines.  Any blood disorders you have.  Any surgeries you have had.  Any medical conditions you have.  Whether you are pregnant or may be pregnant. What are the risks? Generally, this is a safe procedure. However, problems may occur, including:  Allergic reactions to medicines.  A blood clot that breaks free and travels to other parts of your body.  The possible return of an abnormal heart rhythm within hours or days after the procedure.  Your heart stopping (cardiac arrest). This is rare. What happens before the procedure? Medicines  Your health care provider may have you start taking: ? Blood-thinning medicines (anticoagulants) so your blood does not clot as easily. ? Medicines to help stabilize your heart rate and rhythm.  Ask your health care provider about: ? Changing or stopping your regular medicines. This is especially important if you are taking diabetes medicines or blood thinners. ? Taking medicines such as aspirin and ibuprofen. These medicines can  thin your blood. Do not take these medicines unless your health care provider tells you to take them. ? Taking over-the-counter medicines, vitamins, herbs, and supplements. General instructions  Follow instructions from your health care provider about eating or drinking restrictions.  Plan to have someone take you home from the hospital or clinic.  If you will be going home right after the procedure, plan to have someone with you for 24 hours.  Ask your health care provider what steps will be taken to help prevent infection. These may include washing your skin with a germ-killing soap. What happens during the procedure?   An IV will be inserted into one of your veins.  Sticky patches (electrodes) or metal paddles may be placed on your chest.  You will be given a medicine to help you relax (sedative).  An electrical shock will be delivered. The procedure may vary among health care providers and hospitals. What can I expect after the procedure?  Your blood pressure, heart rate, breathing rate, and blood oxygen level will be monitored until you leave the hospital or clinic.  Your heart rhythm will be watched to make sure it does not change.  You may have some redness on the skin where the shocks were given. Follow these instructions at home:  Do not drive for 24 hours if you were given a sedative during your procedure.  Take over-the-counter and prescription medicines only as told by your health care provider.  Ask your health care provider how to check your pulse. Check it often.  Rest for 48 hours after the procedure or   as told by your health care provider.  Avoid or limit your caffeine use as told by your health care provider.  Keep all follow-up visits as told by your health care provider. This is important. Contact a health care provider if:  You feel like your heart is beating too quickly or your pulse is not regular.  You have a serious muscle cramp that does not go  away. Get help right away if:  You have discomfort in your chest.  You are dizzy or you feel faint.  You have trouble breathing or you are short of breath.  Your speech is slurred.  You have trouble moving an arm or leg on one side of your body.  Your fingers or toes turn cold or blue. Summary  Electrical cardioversion is the delivery of a jolt of electricity to restore a normal rhythm to the heart.  This procedure may be done right away in an emergency or may be a scheduled procedure if the condition is not an emergency.  Generally, this is a safe procedure.  After the procedure, check your pulse often as told by your health care provider. This information is not intended to replace advice given to you by your health care provider. Make sure you discuss any questions you have with your health care provider. Document Revised: 05/23/2019 Document Reviewed: 05/23/2019 Elsevier Patient Education  2020 Elsevier Inc.  

## 2020-07-03 DIAGNOSIS — S46092D Other injury of muscle(s) and tendon(s) of the rotator cuff of left shoulder, subsequent encounter: Secondary | ICD-10-CM | POA: Diagnosis not present

## 2020-07-03 DIAGNOSIS — Z4789 Encounter for other orthopedic aftercare: Secondary | ICD-10-CM | POA: Diagnosis not present

## 2020-07-04 DIAGNOSIS — N3000 Acute cystitis without hematuria: Secondary | ICD-10-CM | POA: Diagnosis not present

## 2020-07-04 DIAGNOSIS — R300 Dysuria: Secondary | ICD-10-CM | POA: Diagnosis not present

## 2020-07-05 DIAGNOSIS — Z4789 Encounter for other orthopedic aftercare: Secondary | ICD-10-CM | POA: Diagnosis not present

## 2020-07-05 DIAGNOSIS — S46092D Other injury of muscle(s) and tendon(s) of the rotator cuff of left shoulder, subsequent encounter: Secondary | ICD-10-CM | POA: Diagnosis not present

## 2020-07-06 DIAGNOSIS — I10 Essential (primary) hypertension: Secondary | ICD-10-CM | POA: Diagnosis not present

## 2020-07-06 DIAGNOSIS — I2510 Atherosclerotic heart disease of native coronary artery without angina pectoris: Secondary | ICD-10-CM | POA: Diagnosis not present

## 2020-07-06 DIAGNOSIS — E785 Hyperlipidemia, unspecified: Secondary | ICD-10-CM | POA: Diagnosis not present

## 2020-07-06 DIAGNOSIS — I4891 Unspecified atrial fibrillation: Secondary | ICD-10-CM | POA: Diagnosis not present

## 2020-07-10 DIAGNOSIS — Z4789 Encounter for other orthopedic aftercare: Secondary | ICD-10-CM | POA: Diagnosis not present

## 2020-07-10 DIAGNOSIS — S46092D Other injury of muscle(s) and tendon(s) of the rotator cuff of left shoulder, subsequent encounter: Secondary | ICD-10-CM | POA: Diagnosis not present

## 2020-07-13 ENCOUNTER — Other Ambulatory Visit: Payer: Self-pay

## 2020-07-13 ENCOUNTER — Telehealth (HOSPITAL_COMMUNITY): Payer: Self-pay | Admitting: Emergency Medicine

## 2020-07-13 ENCOUNTER — Other Ambulatory Visit: Payer: Medicare Other | Admitting: *Deleted

## 2020-07-13 DIAGNOSIS — I4819 Other persistent atrial fibrillation: Secondary | ICD-10-CM | POA: Diagnosis not present

## 2020-07-13 DIAGNOSIS — I4891 Unspecified atrial fibrillation: Secondary | ICD-10-CM | POA: Diagnosis not present

## 2020-07-13 LAB — BASIC METABOLIC PANEL
BUN/Creatinine Ratio: 17 (ref 10–24)
BUN: 19 mg/dL (ref 8–27)
CO2: 25 mmol/L (ref 20–29)
Calcium: 9.3 mg/dL (ref 8.6–10.2)
Chloride: 103 mmol/L (ref 96–106)
Creatinine, Ser: 1.1 mg/dL (ref 0.76–1.27)
GFR calc Af Amer: 77 mL/min/{1.73_m2} (ref >59)
GFR calc non Af Amer: 67 mL/min/{1.73_m2} (ref >59)
Glucose: 88 mg/dL (ref 65–99)
Potassium: 4.5 mmol/L (ref 3.5–5.2)
Sodium: 140 mmol/L (ref 134–144)

## 2020-07-13 LAB — CBC WITH DIFFERENTIAL/PLATELET
Basophils Absolute: 0.1 10*3/uL (ref 0.0–0.2)
Basos: 1 %
EOS (ABSOLUTE): 0.2 10*3/uL (ref 0.0–0.4)
Eos: 3 %
Hematocrit: 41.1 % (ref 37.5–51.0)
Hemoglobin: 13.4 g/dL (ref 13.0–17.7)
Immature Grans (Abs): 0 10*3/uL (ref 0.0–0.1)
Immature Granulocytes: 0 %
Lymphocytes Absolute: 3 10*3/uL (ref 0.7–3.1)
Lymphs: 37 %
MCH: 24.4 pg — ABNORMAL LOW (ref 26.6–33.0)
MCHC: 32.6 g/dL (ref 31.5–35.7)
MCV: 75 fL — ABNORMAL LOW (ref 79–97)
Monocytes Absolute: 0.8 10*3/uL (ref 0.1–0.9)
Monocytes: 10 %
Neutrophils Absolute: 4.1 10*3/uL (ref 1.4–7.0)
Neutrophils: 49 %
Platelets: 259 10*3/uL (ref 150–450)
RBC: 5.5 x10E6/uL (ref 4.14–5.80)
RDW: 15.9 % — ABNORMAL HIGH (ref 11.6–15.4)
WBC: 8.2 10*3/uL (ref 3.4–10.8)

## 2020-07-13 NOTE — Telephone Encounter (Signed)
Attempted to call patient regarding upcoming cardiac CT appointment. °Left message on voicemail with name and callback number °Chrys Landgrebe RN Navigator Cardiac Imaging °[facility] °[phone] Office °[phone] Cell ° °

## 2020-07-13 NOTE — Telephone Encounter (Signed)
Pt returning phone call regarding upcoming cardiac imaging study; pt verbalizes understanding of appt date/time, parking situation and where to check in, pre-test NPO status and medications ordered, and verified current allergies; name and call back number provided for further questions should they arise Marchia Bond RN Navigator Cardiac [facility] [phone] office [phone] cell  Pt instructed NOT to take PO metoprolol if HR is <60 bpm and/or SBP <110. Pt states he has an automatic BP cuff at home AND his wife is a retired Biomedical scientist with a traditional sphygmomanometer to check his BP prior to taking additional medications.  Pt verbalized understanding and appreciated the call.  Clarise Cruz

## 2020-07-16 ENCOUNTER — Ambulatory Visit (HOSPITAL_COMMUNITY)
Admission: RE | Admit: 2020-07-16 | Discharge: 2020-07-16 | Disposition: A | Discharge status: Home / Self Care | Payer: Medicare Other | Source: Ambulatory Visit | Attending: Internal Medicine | Admitting: Internal Medicine

## 2020-07-16 ENCOUNTER — Other Ambulatory Visit: Payer: Self-pay

## 2020-07-16 DIAGNOSIS — I4891 Unspecified atrial fibrillation: Secondary | ICD-10-CM | POA: Insufficient documentation

## 2020-07-16 MED ORDER — IOHEXOL 350 MG/ML SOLN
80.0000 mL | Freq: Once | INTRAVENOUS | Status: AC | PRN
  Administered 2020-07-16: 80 mL via INTRAVENOUS

## 2020-07-17 ENCOUNTER — Ambulatory Visit (INDEPENDENT_AMBULATORY_CARE_PROVIDER_SITE_OTHER): Payer: Medicare Other | Admitting: Podiatry

## 2020-07-17 ENCOUNTER — Other Ambulatory Visit (HOSPITAL_COMMUNITY)
Admission: RE | Admit: 2020-07-17 | Discharge: 2020-07-17 | Disposition: A | Discharge status: Home / Self Care | Payer: Medicare Other | Source: Ambulatory Visit | Attending: Internal Medicine | Admitting: Internal Medicine

## 2020-07-17 ENCOUNTER — Other Ambulatory Visit (HOSPITAL_COMMUNITY): Payer: Self-pay

## 2020-07-17 DIAGNOSIS — L03032 Cellulitis of left toe: Secondary | ICD-10-CM | POA: Diagnosis not present

## 2020-07-17 DIAGNOSIS — Z01812 Encounter for preprocedural laboratory examination: Secondary | ICD-10-CM | POA: Diagnosis not present

## 2020-07-17 DIAGNOSIS — Z4789 Encounter for other orthopedic aftercare: Secondary | ICD-10-CM | POA: Diagnosis not present

## 2020-07-17 DIAGNOSIS — L02612 Cutaneous abscess of left foot: Secondary | ICD-10-CM

## 2020-07-17 DIAGNOSIS — S46092D Other injury of muscle(s) and tendon(s) of the rotator cuff of left shoulder, subsequent encounter: Secondary | ICD-10-CM | POA: Diagnosis not present

## 2020-07-17 DIAGNOSIS — Z20822 Contact with and (suspected) exposure to covid-19: Secondary | ICD-10-CM | POA: Diagnosis not present

## 2020-07-17 DIAGNOSIS — L97501 Non-pressure chronic ulcer of other part of unspecified foot limited to breakdown of skin: Secondary | ICD-10-CM | POA: Diagnosis not present

## 2020-07-17 DIAGNOSIS — L84 Corns and callosities: Secondary | ICD-10-CM | POA: Diagnosis not present

## 2020-07-17 NOTE — Telephone Encounter (Signed)
Patient is following up. He states he has questions regarding the type of ablation he is having. He also wants to confirm time of arrival and approx amount of time the procedure will take.

## 2020-07-17 NOTE — Telephone Encounter (Signed)
Spoke to patient and answered all questions.   Patient verbalized understanding.

## 2020-07-18 LAB — SARS CORONAVIRUS 2 (TAT 6-24 HRS): SARS Coronavirus 2: NEGATIVE

## 2020-07-19 ENCOUNTER — Other Ambulatory Visit: Payer: Self-pay | Admitting: Internal Medicine

## 2020-07-19 NOTE — Progress Notes (Signed)
Instructed patient on the following items: Arrival time 1030 Nothing to eat or drink after midnight No meds AM of procedure Responsible person to drive you home and stay with you for 24 hrs  Have you missed any doses of anti-coagulant-Xarelto- hasn't missed any doses    

## 2020-07-20 ENCOUNTER — Ambulatory Visit (HOSPITAL_COMMUNITY): Payer: Medicare Other | Admitting: Anesthesiology

## 2020-07-20 ENCOUNTER — Other Ambulatory Visit: Payer: Self-pay

## 2020-07-20 ENCOUNTER — Ambulatory Visit (HOSPITAL_COMMUNITY)
Admission: RE | Disposition: A | Discharge status: Home / Self Care | Payer: Medicare Other | Source: Home / Self Care | Attending: Internal Medicine

## 2020-07-20 ENCOUNTER — Ambulatory Visit (HOSPITAL_COMMUNITY)
Admission: RE | Admit: 2020-07-20 | Discharge: 2020-07-20 | Disposition: A | Discharge status: Home / Self Care | Payer: Medicare Other | Attending: Internal Medicine | Admitting: Internal Medicine

## 2020-07-20 DIAGNOSIS — Z885 Allergy status to narcotic agent status: Secondary | ICD-10-CM | POA: Diagnosis not present

## 2020-07-20 DIAGNOSIS — Z7901 Long term (current) use of anticoagulants: Secondary | ICD-10-CM | POA: Diagnosis not present

## 2020-07-20 DIAGNOSIS — R55 Syncope and collapse: Secondary | ICD-10-CM | POA: Insufficient documentation

## 2020-07-20 DIAGNOSIS — Z85820 Personal history of malignant melanoma of skin: Secondary | ICD-10-CM | POA: Diagnosis not present

## 2020-07-20 DIAGNOSIS — I483 Typical atrial flutter: Secondary | ICD-10-CM | POA: Insufficient documentation

## 2020-07-20 DIAGNOSIS — E785 Hyperlipidemia, unspecified: Secondary | ICD-10-CM | POA: Diagnosis not present

## 2020-07-20 DIAGNOSIS — K219 Gastro-esophageal reflux disease without esophagitis: Secondary | ICD-10-CM | POA: Diagnosis not present

## 2020-07-20 DIAGNOSIS — Z8249 Family history of ischemic heart disease and other diseases of the circulatory system: Secondary | ICD-10-CM | POA: Insufficient documentation

## 2020-07-20 DIAGNOSIS — I4819 Other persistent atrial fibrillation: Secondary | ICD-10-CM | POA: Diagnosis not present

## 2020-07-20 DIAGNOSIS — I4892 Unspecified atrial flutter: Secondary | ICD-10-CM | POA: Diagnosis not present

## 2020-07-20 DIAGNOSIS — Z79899 Other long term (current) drug therapy: Secondary | ICD-10-CM | POA: Insufficient documentation

## 2020-07-20 DIAGNOSIS — G4733 Obstructive sleep apnea (adult) (pediatric): Secondary | ICD-10-CM | POA: Insufficient documentation

## 2020-07-20 DIAGNOSIS — Z888 Allergy status to other drugs, medicaments and biological substances status: Secondary | ICD-10-CM | POA: Diagnosis not present

## 2020-07-20 DIAGNOSIS — I2510 Atherosclerotic heart disease of native coronary artery without angina pectoris: Secondary | ICD-10-CM | POA: Insufficient documentation

## 2020-07-20 DIAGNOSIS — Z7982 Long term (current) use of aspirin: Secondary | ICD-10-CM | POA: Diagnosis not present

## 2020-07-20 DIAGNOSIS — I480 Paroxysmal atrial fibrillation: Secondary | ICD-10-CM | POA: Diagnosis not present

## 2020-07-20 DIAGNOSIS — I10 Essential (primary) hypertension: Secondary | ICD-10-CM | POA: Diagnosis not present

## 2020-07-20 HISTORY — PX: ATRIAL FIBRILLATION ABLATION: EP1191

## 2020-07-20 SURGERY — ATRIAL FIBRILLATION ABLATION
Anesthesia: General

## 2020-07-20 MED ORDER — ONDANSETRON HCL 4 MG/2ML IJ SOLN: 4.0000 mg | Freq: Four times a day (QID) | INTRAMUSCULAR | Status: DC | PRN

## 2020-07-20 MED ORDER — ONDANSETRON HCL 4 MG/2ML IJ SOLN
INTRAMUSCULAR | Status: DC | PRN
  Administered 2020-07-20: 4 mg via INTRAVENOUS

## 2020-07-20 MED ORDER — DEXAMETHASONE SODIUM PHOSPHATE 10 MG/ML IJ SOLN
INTRAMUSCULAR | Status: DC | PRN
  Administered 2020-07-20: 4 mg via INTRAVENOUS

## 2020-07-20 MED ORDER — APIXABAN 5 MG PO TABS: 5.0000 mg | ORAL_TABLET | Freq: Once | ORAL | Status: DC

## 2020-07-20 MED ORDER — SODIUM CHLORIDE 0.9 % IV SOLN: INTRAVENOUS | Status: DC

## 2020-07-20 MED ORDER — HEPARIN (PORCINE) IN NACL 2-0.9 UNITS/ML
INTRAMUSCULAR | Status: AC | PRN
  Administered 2020-07-20: 500 mL

## 2020-07-20 MED ORDER — SODIUM CHLORIDE 0.9% FLUSH: 3.0000 mL | Freq: Two times a day (BID) | INTRAVENOUS | Status: DC

## 2020-07-20 MED ORDER — SODIUM CHLORIDE 0.9 % IV SOLN: 250.0000 mL | INTRAVENOUS | Status: DC | PRN

## 2020-07-20 MED ORDER — PHENYLEPHRINE HCL-NACL 10-0.9 MG/250ML-% IV SOLN: INTRAVENOUS | Status: DC | PRN

## 2020-07-20 MED ORDER — SUGAMMADEX SODIUM 200 MG/2ML IV SOLN
INTRAVENOUS | Status: DC | PRN
  Administered 2020-07-20: 200 mg via INTRAVENOUS

## 2020-07-20 MED ORDER — RIVAROXABAN 20 MG PO TABS
20.0000 mg | ORAL_TABLET | ORAL | Status: AC
  Administered 2020-07-20: 20 mg via ORAL
  Filled 2020-07-20: qty 1

## 2020-07-20 MED ORDER — LACTATED RINGERS IV SOLN: INTRAVENOUS | Status: DC | PRN

## 2020-07-20 MED ORDER — HEPARIN SODIUM (PORCINE) 1000 UNIT/ML IJ SOLN
INTRAMUSCULAR | Status: DC | PRN
  Administered 2020-07-20: 14000 [IU] via INTRAVENOUS
  Administered 2020-07-20: 1000 [IU] via INTRAVENOUS

## 2020-07-20 MED ORDER — HEPARIN SODIUM (PORCINE) 1000 UNIT/ML IJ SOLN
INTRAMUSCULAR | Status: DC | PRN
  Administered 2020-07-20 (×2): 3000 [IU] via INTRAVENOUS

## 2020-07-20 MED ORDER — PHENYLEPHRINE 40 MCG/ML (10ML) SYRINGE FOR IV PUSH (FOR BLOOD PRESSURE SUPPORT)
PREFILLED_SYRINGE | INTRAVENOUS | Status: DC | PRN
  Administered 2020-07-20: 40 ug via INTRAVENOUS

## 2020-07-20 MED ORDER — HEPARIN (PORCINE) IN NACL 1000-0.9 UT/500ML-% IV SOLN
INTRAVENOUS | Status: AC
  Filled 2020-07-20: qty 500

## 2020-07-20 MED ORDER — PROTAMINE SULFATE 10 MG/ML IV SOLN
INTRAVENOUS | Status: DC | PRN
  Administered 2020-07-20: 30 mg via INTRAVENOUS

## 2020-07-20 MED ORDER — ROCURONIUM BROMIDE 10 MG/ML (PF) SYRINGE
PREFILLED_SYRINGE | INTRAVENOUS | Status: DC | PRN
  Administered 2020-07-20: 100 mg via INTRAVENOUS

## 2020-07-20 MED ORDER — PROPOFOL 10 MG/ML IV BOLUS
INTRAVENOUS | Status: DC | PRN
  Administered 2020-07-20: 150 mg via INTRAVENOUS
  Administered 2020-07-20: 50 mg via INTRAVENOUS

## 2020-07-20 MED ORDER — HEPARIN SODIUM (PORCINE) 1000 UNIT/ML IJ SOLN
INTRAMUSCULAR | Status: AC
  Filled 2020-07-20: qty 2

## 2020-07-20 MED ORDER — ACETAMINOPHEN 325 MG PO TABS
650.0000 mg | ORAL_TABLET | ORAL | Status: DC | PRN
  Filled 2020-07-20: qty 2

## 2020-07-20 MED ORDER — LIDOCAINE 2% (20 MG/ML) 5 ML SYRINGE
INTRAMUSCULAR | Status: DC | PRN
  Administered 2020-07-20: 60 mg via INTRAVENOUS

## 2020-07-20 MED ORDER — SODIUM CHLORIDE 0.9% FLUSH: 3.0000 mL | INTRAVENOUS | Status: DC | PRN

## 2020-07-20 SURGICAL SUPPLY — 21 items
BLANKET WARM UNDERBOD FULL ACC (MISCELLANEOUS) ×3 IMPLANT
CATH 8FR REPROCESSED SOUNDSTAR (CATHETERS) ×3 IMPLANT
CATH 8FR SOUNDSTAR REPROCESSED (CATHETERS) IMPLANT
CATH MAPPNG PENTARAY F 2-6-2MM (CATHETERS) IMPLANT
CATH SMTCH THERMOCOOL SF DF (CATHETERS) ×2 IMPLANT
CATH WEBSTER BI DIR CS D-F CRV (CATHETERS) ×2 IMPLANT
COVER SWIFTLINK CONNECTOR (BAG) ×3 IMPLANT
DEVICE CLOSURE PERCLS PRGLD 6F (VASCULAR PRODUCTS) IMPLANT
NDL BAYLIS TRANSSEPTAL 71CM (NEEDLE) IMPLANT
NEEDLE BAYLIS TRANSSEPTAL 71CM (NEEDLE) ×3 IMPLANT
PACK EP LATEX FREE (CUSTOM PROCEDURE TRAY) ×3
PACK EP LF (CUSTOM PROCEDURE TRAY) ×1 IMPLANT
PAD PRO RADIOLUCENT 2001M-C (PAD) ×3 IMPLANT
PATCH CARTO3 (PAD) ×2 IMPLANT
PENTARAY F 2-6-2MM (CATHETERS) ×3
PERCLOSE PROGLIDE 6F (VASCULAR PRODUCTS) ×9
SHEATH PINNACLE 7F 10CM (SHEATH) ×4 IMPLANT
SHEATH PINNACLE 9F 10CM (SHEATH) ×2 IMPLANT
SHEATH PROBE COVER 6X72 (BAG) ×2 IMPLANT
SHEATH SWARTZ TS SL2 63CM 8.5F (SHEATH) ×2 IMPLANT
TUBING SMART ABLATE COOLFLOW (TUBING) ×2 IMPLANT

## 2020-07-20 NOTE — Anesthesia Procedure Notes (Signed)
Procedure Name: Intubation Performed by: Milford Cage, CRNA Pre-anesthesia Checklist: Patient identified, Emergency Drugs available, Suction available and Patient being monitored Patient Re-evaluated:Patient Re-evaluated prior to induction Oxygen Delivery Method: Circle System Utilized Preoxygenation: Pre-oxygenation with 100% oxygen Induction Type: IV induction Ventilation: Mask ventilation without difficulty Laryngoscope Size: Miller and 2 Grade View: Grade II Tube type: Oral Tube size: 7.5 mm Number of attempts: 1 Airway Equipment and Method: Stylet Placement Confirmation: ETT inserted through vocal cords under direct vision,  positive ETCO2 and breath sounds checked- equal and bilateral Secured at: 22 cm Tube secured with: Tape Dental Injury: Teeth and Oropharynx as per pre-operative assessment

## 2020-07-20 NOTE — Anesthesia Postprocedure Evaluation (Signed)
Anesthesia Post Note  Patient: Jose Stone  Procedure(s) Performed: ATRIAL FIBRILLATION ABLATION (N/A )     Patient location during evaluation: Cath Lab Anesthesia Type: General Level of consciousness: awake and alert Pain management: pain level controlled Vital Signs Assessment: post-procedure vital signs reviewed and stable Respiratory status: spontaneous breathing, nonlabored ventilation and respiratory function stable Cardiovascular status: blood pressure returned to baseline and stable Postop Assessment: no apparent nausea or vomiting Anesthetic complications: no   No complications documented.  Last Vitals:  Vitals:   07/20/20 1620 07/20/20 1625  BP: (!) 145/75   Pulse: 60   Resp: 17   Temp:  36.9 C  SpO2: 96%     Last Pain:  Vitals:   07/20/20 1625  TempSrc: Temporal  PainSc: 0-No pain                 Jeremiah Curci,W. EDMOND

## 2020-07-20 NOTE — Transfer of Care (Signed)
Immediate Anesthesia Transfer of Care Note  Patient: Jose Stone  Procedure(s) Performed: ATRIAL FIBRILLATION ABLATION (N/A )  Patient Location: PACU  Anesthesia Type:General  Level of Consciousness: awake  Airway & Oxygen Therapy: Patient Spontanous Breathing and Patient connected to nasal cannula oxygen  Post-op Assessment: Report given to RN and Post -op Vital signs reviewed and stable  Post vital signs: Reviewed and stable  Last Vitals:  Vitals Value Taken Time  BP 153/68 07/20/20 1556  Temp 36.5 C 07/20/20 1556  Pulse 64 07/20/20 1557  Resp 16 07/20/20 1557  SpO2 95 % 07/20/20 1557  Vitals shown include unvalidated device data.  Last Pain:  Vitals:   07/20/20 1556  TempSrc: Temporal  PainSc: 0-No pain         Complications: No complications documented.

## 2020-07-20 NOTE — Interval H&P Note (Signed)
History and Physical Interval Note:  07/20/2020 11:05 AM  Jose Stone  has presented today for surgery, with the diagnosis of afib.  The various methods of treatment have been discussed with the patient and family. After consideration of risks, benefits and other options for treatment, the patient has consented to  Procedure(s): ATRIAL FIBRILLATION ABLATION (N/A) as a surgical intervention.  The patient's history has been reviewed, patient examined, no change in status, stable for surgery.  I have reviewed the patient's chart and labs.  Questions were answered to the patient's satisfaction.    Risk, benefits, and alternatives to EP study and radiofrequency ablation for afib were also discussed in detail today. These risks include but are not limited to stroke, bleeding, vascular damage, tamponade, perforation, damage to the esophagus, lungs, and other structures, pulmonary vein stenosis, worsening renal function, and death. The patient understands these risk and wishes to proceed.     Cardiac CT reviewed at length with him today He reports compliance with xarelto without interruption.   [name]

## 2020-07-20 NOTE — Progress Notes (Addendum)
Client states needs to urinate, sat on side of bed; tolerated well; client walked and tolerated well; right groin stable, no bleeding or hematoma; per Dr Rayann Heman client may be d/ced at [facility]

## 2020-07-20 NOTE — Anesthesia Preprocedure Evaluation (Addendum)
Anesthesia Evaluation  Patient identified by MRN, date of birth, ID band Patient awake    Reviewed: Allergy & Precautions, NPO status , Patient's Chart, lab work & pertinent test results, reviewed documented beta blocker date and time   Airway Mallampati: II  TM Distance: >3 FB Neck ROM: Full    Dental no notable dental hx. (+) Teeth Intact, Dental Advisory Given   Pulmonary sleep apnea and Continuous Positive Airway Pressure Ventilation ,    Pulmonary exam normal breath sounds clear to auscultation       Cardiovascular hypertension, Pt. on medications and Pt. on home beta blockers + CAD  Normal cardiovascular exam+ dysrhythmias (metoprolol, dilt, eliquis) Atrial Fibrillation  Rhythm:Regular Rate:Normal  Echo 06/2020: 1. 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 Left ventricular ejection fraction, by estimation, is 60 to 65%.  The left ventricle has normal function. The left ventricle has no regional  wall motion abnormalities.  2. Right ventricular systolic function is normal. The right ventricular  size is normal. There is moderately elevated pulmonary artery systolic  pressure.  3. The mitral valve is normal in structure. No evidence of mitral valve  regurgitation. No evidence of mitral stenosis.  4. The aortic valve is tricuspid. There is moderate thickening and  moderate calcification of the aortic valve. Aortic valve regurgitation is  trivial. No aortic stenosis is present.  5. Aortic dilatation noted. 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 The inferior vena cava is normal in size with greater than 50%  respiratory variability, suggesting right atrial pressure of 3 mmHg.    Neuro/Psych negative neurological ROS  negative psych ROS   GI/Hepatic Neg liver ROS, GERD  Controlled and Medicated,  Endo/Other  Obesity BMI 32   Renal/GU negative Renal ROS  negative genitourinary   Musculoskeletal negative musculoskeletal ROS (+)   Abdominal   Peds  Hematology negative hematology ROS (+)   Anesthesia Other Findings   Reproductive/Obstetrics negative OB ROS                            Anesthesia Physical Anesthesia Plan  ASA: III  Anesthesia Plan: General   Post-op Pain Management:    Induction: Intravenous  PONV Risk Score and Plan: 2 and Ondansetron, Dexamethasone and Treatment may vary due to age or medical condition  Airway Management Planned: Oral ETT  Additional Equipment: None  Intra-op Plan:   Post-operative Plan: Extubation in OR  Informed Consent: I have reviewed the patients History and Physical, chart, labs and discussed the procedure including the risks, benefits and alternatives for the proposed anesthesia with the patient or authorized representative who has indicated his/her understanding and acceptance.     Dental advisory given  Plan Discussed with: CRNA  Anesthesia Plan Comments:        Anesthesia Quick Evaluation

## 2020-07-20 NOTE — Discharge Instructions (Signed)
Cardiac Ablation, Care After This sheet gives you information about how to care for yourself after your procedure. Your health care provider may also give you more specific instructions. If you have problems or questions, contact your health care provider. What can I expect after the procedure? After the procedure, it is common to have:  Bruising around your puncture site.  Tenderness around your puncture site.  Skipped heartbeats.  Tiredness (fatigue). Follow these instructions at home: Puncture site care   Follow instructions from your health care provider about how to take care of your puncture site. Make sure you: ? Wash your hands with soap and water before you change your bandage (dressing). If soap and water are not available, use hand sanitizer. ? Change your dressing as told by your health care provider. ? Leave stitches (sutures), skin glue, or adhesive strips in place. These skin closures may need to stay in place for up to 2 weeks. If adhesive strip edges start to loosen and curl up, you may trim the loose edges. Do not remove adhesive strips completely unless your health care provider tells you to do that.  Check your puncture site every day for signs of infection. Check for: ? Redness, swelling, or pain. ? Fluid or blood. If your puncture site starts to bleed, lie down on your back, apply firm pressure to the area, and contact your health care provider. ? Warmth. ? Pus or a bad smell. Driving  Ask your health care provider when it is safe for you to drive again after the procedure.  Do not drive or use heavy machinery while taking prescription pain medicine.  Do not drive for 24 hours if you were given a medicine to help you relax (sedative) during your procedure. Activity  Avoid activities that take a lot of effort for at least 3 days after your procedure.  Do not lift anything that is heavier than 10 lb (4.5 kg), or the limit that you are told, until your health  care provider says that it is safe.  Return to your normal activities as told by your health care provider. Ask your health care provider what activities are safe for you. General instructions  Take over-the-counter and prescription medicines only as told by your health care provider.  Do not use any products that contain nicotine or tobacco, such as cigarettes and e-cigarettes. If you need help quitting, ask your health care provider.  Do not take baths, swim, or use a hot tub until your health care provider approves.  Do not drink alcohol for 24 hours after your procedure.  Keep all follow-up visits as told by your health care provider. This is important. Contact a health care provider if:  You have redness, mild swelling, or pain around your puncture site.  You have fluid or blood coming from your puncture site that stops after applying firm pressure to the area.  Your puncture site feels warm to the touch.  You have pus or a bad smell coming from your puncture site.  You have a fever.  You have chest pain or discomfort that spreads to your neck, jaw, or arm.  You are sweating a lot.  You feel nauseous.  You have a fast or irregular heartbeat.  You have shortness of breath.  You are dizzy or light-headed and feel the need to lie down.  You have pain or numbness in the arm or leg closest to your puncture site. Get help right away if:  Your puncture   site suddenly swells.  Your puncture site is bleeding and the bleeding does not stop after applying firm pressure to the area. These symptoms may represent a serious problem that is an emergency. Do not wait to see if the symptoms will go away. Get medical help right away. Call your local emergency services (911 in the [place] Do not drive yourself to the hospital. Summary  After the procedure, it is normal to have bruising and tenderness at the puncture site in your groin, neck, or forearm.  Check your puncture site every  day for signs of infection.  Get help right away if your puncture site is bleeding and the bleeding does not stop after applying firm pressure to the area. This is a medical emergency. This information is not intended to replace advice given to you by your health care provider. Make sure you discuss any questions you have with your health care provider. Document Revised: 10/02/2017 Document Reviewed: 01/29/2017 Elsevier Patient Education  [place] procedure care instructions No driving for 4 days. No lifting over 5 lbs for 1 week. No vigorous or sexual activity for 1 week. You may return to work/your usual activities on 07/27/2020. Keep procedure site clean & dry. If you notice increased pain, swelling, bleeding or pus, call/return!  You may shower, but no soaking baths/hot tubs/pools for 1 week.    You have an appointment set up with the [facility].  Multiple studies have shown that being followed by a dedicated atrial fibrillation clinic in addition to the standard care you receive from your other physicians improves health. We believe that enrollment in the atrial fibrillation clinic will allow [place] to better care for you.   The phone number to the [facility] is [phone]. The clinic is staffed Monday through Friday from 8:30am to 5pm.  Parking Directions: The clinic is located in the Heart and Vascular Building connected to [facility]. 1)From [address] turn on to [address] and go to the 3rd entrance  (Heart and Vascular entrance) on the right. 2)Look to the right for Heart &Vascular Parking Garage. 3)A code for the entrance is requiredfor October is 3009   4)Take the elevators to the 1st floor. Registration is in the room with the glass walls at the end of the hallway.  If you have any trouble parking or locating the clinic, please don't hesitate to call [phone].

## 2020-07-23 ENCOUNTER — Telehealth: Payer: Self-pay | Admitting: Internal Medicine

## 2020-07-23 ENCOUNTER — Encounter (HOSPITAL_COMMUNITY): Payer: Self-pay | Admitting: Internal Medicine

## 2020-07-23 MED FILL — Heparin Sod (Porcine)-NaCl IV Soln 1000 Unit/500ML-0.9%: INTRAVENOUS | Qty: 500 | Status: AC

## 2020-07-23 NOTE — Progress Notes (Signed)
Subjective: 74 year old male presents the office today for follow-up evaluation of wound, infection of his left foot.  He states the infection is resolved but he still is a callus to the area.  Denies any drainage or pus any swelling or redness.  He has no other concerns today. Denies any systemic concerns including fevers, chills, nausea, vomiting.  Objective: AAO x3, NAD DP/PT pulses palpable bilaterally, CRT less than 3 seconds Hyperkeratotic tissue with central area of skin breakdown on the medial left hallux IPJ and upon debridement there is no ongoing ulceration drainage or signs of infection.  There is no edema, erythema and signs of infection noted to the foot today.  Infection appears to be resolved.  No pain with calf compression, swelling, warmth, erythema  Assessment: Ulceration left hallux IPJ with resolved cellulitis  Plan: -All treatment options discussed with the patient including all alternatives, risks, complications.  -Debrided the hyperkeratotic tissue with any complications or bleeding.  Recommend moisturizer daily.  Continue offloading.  Monitor for skin breakdown or any recurrence of infection.  Trula Slade DPM

## 2020-07-23 NOTE — Telephone Encounter (Signed)
Patient had an ablation on Friday. After his ablation he was told to limit his weight lifting to less than 5 pounds for one week. The patient goes to Physical Therapy to recover a rotator cuff. Sometimes they use bands that add weight to increase his ROM which can add weight.  He remembers that Dr. Rayann Heman said he could go to PT but he needs clarification as to what exercises he can and can not do next week. He has PT at  10:00 tomorrow and Thursday

## 2020-07-24 DIAGNOSIS — Z4789 Encounter for other orthopedic aftercare: Secondary | ICD-10-CM | POA: Diagnosis not present

## 2020-07-24 DIAGNOSIS — S46092D Other injury of muscle(s) and tendon(s) of the rotator cuff of left shoulder, subsequent encounter: Secondary | ICD-10-CM | POA: Diagnosis not present

## 2020-07-24 LAB — POCT ACTIVATED CLOTTING TIME
Activated Clotting Time: 274 seconds
Activated Clotting Time: 279 seconds

## 2020-07-24 NOTE — Telephone Encounter (Signed)
Spoke to patient and clarified only range of motion this first week, no weights and nothing that is going to increase his heart rate. Educated that we don't want his groin site to bleed and we also want his heart to rest. Answered all other questions  Patient verbalized understanding

## 2020-07-30 DIAGNOSIS — Z23 Encounter for immunization: Secondary | ICD-10-CM | POA: Diagnosis not present

## 2020-07-31 DIAGNOSIS — S46092D Other injury of muscle(s) and tendon(s) of the rotator cuff of left shoulder, subsequent encounter: Secondary | ICD-10-CM | POA: Diagnosis not present

## 2020-07-31 DIAGNOSIS — Z4789 Encounter for other orthopedic aftercare: Secondary | ICD-10-CM | POA: Diagnosis not present

## 2020-08-02 DIAGNOSIS — Z4789 Encounter for other orthopedic aftercare: Secondary | ICD-10-CM | POA: Diagnosis not present

## 2020-08-02 DIAGNOSIS — S46092D Other injury of muscle(s) and tendon(s) of the rotator cuff of left shoulder, subsequent encounter: Secondary | ICD-10-CM | POA: Diagnosis not present

## 2020-08-07 DIAGNOSIS — S46092D Other injury of muscle(s) and tendon(s) of the rotator cuff of left shoulder, subsequent encounter: Secondary | ICD-10-CM | POA: Diagnosis not present

## 2020-08-07 DIAGNOSIS — Z4789 Encounter for other orthopedic aftercare: Secondary | ICD-10-CM | POA: Diagnosis not present

## 2020-08-17 ENCOUNTER — Encounter (HOSPITAL_COMMUNITY): Payer: Self-pay | Admitting: Nurse Practitioner

## 2020-08-17 ENCOUNTER — Ambulatory Visit (HOSPITAL_COMMUNITY)
Admission: RE | Admit: 2020-08-17 | Discharge: 2020-08-17 | Disposition: A | Discharge status: Home / Self Care | Payer: Medicare Other | Source: Ambulatory Visit | Attending: Nurse Practitioner | Admitting: Nurse Practitioner

## 2020-08-17 ENCOUNTER — Other Ambulatory Visit: Payer: Self-pay

## 2020-08-17 VITALS — BP 130/62 | HR 52 | Ht 73.0 in | Wt 240.8 lb

## 2020-08-17 DIAGNOSIS — Z888 Allergy status to other drugs, medicaments and biological substances status: Secondary | ICD-10-CM | POA: Insufficient documentation

## 2020-08-17 DIAGNOSIS — I2510 Atherosclerotic heart disease of native coronary artery without angina pectoris: Secondary | ICD-10-CM | POA: Diagnosis not present

## 2020-08-17 DIAGNOSIS — K219 Gastro-esophageal reflux disease without esophagitis: Secondary | ICD-10-CM | POA: Diagnosis not present

## 2020-08-17 DIAGNOSIS — I4819 Other persistent atrial fibrillation: Secondary | ICD-10-CM | POA: Diagnosis not present

## 2020-08-17 DIAGNOSIS — E785 Hyperlipidemia, unspecified: Secondary | ICD-10-CM | POA: Diagnosis not present

## 2020-08-17 DIAGNOSIS — D6869 Other thrombophilia: Secondary | ICD-10-CM | POA: Diagnosis not present

## 2020-08-17 DIAGNOSIS — I10 Essential (primary) hypertension: Secondary | ICD-10-CM | POA: Diagnosis not present

## 2020-08-17 DIAGNOSIS — Z79899 Other long term (current) drug therapy: Secondary | ICD-10-CM | POA: Diagnosis not present

## 2020-08-17 DIAGNOSIS — Z833 Family history of diabetes mellitus: Secondary | ICD-10-CM | POA: Insufficient documentation

## 2020-08-17 DIAGNOSIS — Z7982 Long term (current) use of aspirin: Secondary | ICD-10-CM | POA: Diagnosis not present

## 2020-08-17 DIAGNOSIS — R001 Bradycardia, unspecified: Secondary | ICD-10-CM | POA: Diagnosis not present

## 2020-08-17 DIAGNOSIS — Z7901 Long term (current) use of anticoagulants: Secondary | ICD-10-CM | POA: Diagnosis not present

## 2020-08-17 DIAGNOSIS — Z885 Allergy status to narcotic agent status: Secondary | ICD-10-CM | POA: Insufficient documentation

## 2020-08-17 MED ORDER — DILTIAZEM HCL ER COATED BEADS 120 MG PO CP24
120.0000 mg | ORAL_CAPSULE | Freq: Every day | ORAL | 1 refills | Status: DC
Start: 2020-08-17 — End: 2020-09-03

## 2020-08-17 NOTE — Progress Notes (Addendum)
Primary Care Physician: Kathyrn Lass, MD Referring Physician: Dr. Loney Hering Jose Stone is a 74 y.o. male with a h/o afib ablation one month ago. He has not noted any afib but is noting HR's in the 40's in the am at which time he feels sluggish. He takes 240 mg  of Cardizem at hs, the dose was increased from 120 mg daily, prior to ablation, to manage his afib. No swallowing or groin issues. He mostly has returned to his normal  activities.   Today, he denies symptoms of palpitations, chest pain, shortness of breath, orthopnea, PND, lower extremity edema, dizziness, presyncope, syncope, or neurologic sequela. The patient is tolerating medications without difficulties and is otherwise without complaint today.   Past Medical History:  Diagnosis Date  . Aortic root enlargement ([place])   . CAD (coronary artery disease)   . Complication of anesthesia    Hypotensive with general anesth.  . GERD (gastroesophageal reflux disease)   . Hyperlipidemia   . Hypertension   . Melanoma ([place])   . Persistent atrial fibrillation ([place])    Afib,Aflutter  . Typical atrial flutter ([place])   . 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 Ventricular septal defect    Past Surgical History:  Procedure Laterality Date  . ATRIAL FIBRILLATION ABLATION N/A 07/20/2020   Procedure: ATRIAL FIBRILLATION ABLATION;  Surgeon: [name], MD;  Location: [facility];  Service: Cardiovascular;  Laterality: N/A;  . CARDIAC CATHETERIZATION    . CARDIOVERSION N/A 02/29/2020   Procedure: CARDIOVERSION;  Surgeon: [name], MD;  Location: [facility] ENDOSCOPY;  Service: Cardiovascular;  Laterality: N/A;  . CARDIOVERSION N/A 06/28/2020   Procedure: CARDIOVERSION;  Surgeon: Thayer Headings, MD;  Location: [facility] ENDOSCOPY;  Service: Cardiovascular;  Laterality: N/A;  . DISTAL INTERPHALANGEAL JOINT FUSION Left 01/19/2019   Procedure: HALLUX INTERPHANGEAL JOINT FUSION LEFT FOOT;  Surgeon: Trula Slade, DPM;  Location: [facility] [place];  Service: Podiatry;  Laterality: Left;  LEFT BLOCK  . hipospedious    . LEFT HEART CATHETERIZATION WITH CORONARY ANGIOGRAM N/A 08/14/2014   Procedure: LEFT HEART CATHETERIZATION WITH CORONARY ANGIOGRAM;  Surgeon: Birdie Riddle, MD;  Location: [facility];  Service: Cardiovascular;  Laterality: N/A;  . MASS EXCISION Left 01/19/2019   Procedure: EXCISION BENIGN LESION 2.OCM LEFT FOOT;  Surgeon: Trula Slade, DPM;  Location: [facility];  Service: Podiatry;  Laterality: Left;  . SHOULDER ARTHROSCOPY WITH SUBACROMIAL DECOMPRESSION Left 05/03/2020   Procedure: SHOULDER ARTHROSCOPY DEBRIDEMENT PARTIAL ROTATOR CUFF REPAIR, SUBACROMIAL DECOMPRESSION, BICEPS TENOTOMY;  Surgeon: Tania Ade, MD;  Location: [facility];  Service: Orthopedics;  Laterality: Left;  . SHOULDER SURGERY    . TONSILLECTOMY  1953    Current Outpatient Medications  Medication Sig Dispense Refill  . acetaminophen (TYLENOL) 500 MG tablet Take 1,000 mg by mouth every 6 (six) hours as needed for mild pain or headache.    . allopurinol (ZYLOPRIM) 100 MG tablet Take 200 mg by mouth daily.   0  . aspirin EC 81 MG tablet Take 81 mg by mouth at bedtime. Swallow whole.    Marland Kitchen atorvastatin (LIPITOR) 80 MG tablet TAKE 1 TABLET BY MOUTH DAILY AT 6 PM 90 tablet 3  . COLCRYS 0.6 MG tablet Take 0.6 mg by mouth daily as needed (FOR GOUT).     Marland Kitchen diltiazem (CARDIZEM CD) 120 MG 24 hr capsule Take 1 capsule (120 mg total) by mouth at bedtime. [place]  capsule 1  . loratadine (CLARITIN) 10 MG tablet Take 10 mg by mouth daily as needed for allergies.    . Multiple Vitamin (MULTIVITAMIN WITH MINERALS) TABS tablet Take 1 tablet by mouth daily. Centrum Silver    . nitroGLYCERIN (NITROSTAT) 0.4 MG SL tablet Place 1 tablet (0.4 mg total) under the tongue every 5 (five) minutes as needed for chest pain. Max 3 doses. 25 tablet 5  . Omega-3 Fat Ac-Cholecalciferol (DRY EYE OMEGA BENEFITS/VIT D-3 PO) Take 2 capsules by mouth in the  morning and at bedtime.    Marland Kitchen omeprazole (PRILOSEC) 40 MG capsule Take 40 mg by mouth daily before breakfast. 30-60 minutes prior to breakfast.  3  . PARoxetine (PAXIL) 20 MG tablet Take 0.5 tablets (10 mg total) by mouth every other day. (Patient taking differently: Take 10 mg by mouth every other day. At bedtime) 30 tablet 0  . Polyethyl Glycol-Propyl Glycol (SYSTANE OP) Apply 1 drop to eye 4 (four) times daily as needed (dry eyes).     . valsartan (DIOVAN) 160 MG tablet Take 1 tablet (160 mg total) by mouth daily. 135 tablet 1  . XARELTO 20 MG TABS tablet TAKE 1 TABLET BY MOUTH EVERY DAY WITH SUPPER (Patient taking differently: Take 20 mg by mouth daily with supper. ) 90 tablet 3  . testosterone cypionate (DEPOTESTOSTERONE CYPIONATE) 200 MG/ML injection Inject 100 mg into the muscle every 14 (fourteen) days. Saturdays. (Patient not taking: Reported on 08/17/2020)  1   No current facility-administered medications for this encounter.    Allergies  Allergen Reactions  . Codeine Nausea Only    Can tolerate with benadryl  . Mupirocin Itching  . Hydrocodone Rash    Can tolerate with benadryl  . Neosporin [Neomycin-Bacitracin Zn-Polymyx] Rash  . Oxycodone Rash    Can tolerate with benadryl    Social History   Socioeconomic History  . Marital status: Married    Spouse name: Not on file  . Number of children: 2  . Years of education: master's  . Highest education level: Not on file  Occupational History  . Occupation: Civil engineer, contracting    Comment: retired  Tobacco Use  . Smoking status: Never Smoker  . Smokeless tobacco: Never Used  Vaping Use  . Vaping Use: Never used  Substance and Sexual Activity  . Alcohol use: No  . Drug use: No  . Sexual activity: Yes    Birth control/protection: None  Other Topics Concern  . Not on file  Social History Narrative   Lives in [place] with spouse.   Retired Civil engineer, contracting   Social Determinants of Radio broadcast assistant Strain:    . Difficulty of Paying Living Expenses: Not on file  Food Insecurity:   . Worried About Charity fundraiser in the Last Year: Not on file  . Ran Out of Food in the Last Year: Not on file  Transportation Needs:   . Lack of Transportation (Medical): Not on file  . Lack of Transportation (Non-Medical): Not on file  Physical Activity:   . Days of Exercise per Week: Not on file  . Minutes of Exercise per Session: Not on file  Stress:   . Feeling of Stress : Not on file  Social Connections:   . Frequency of Communication with Friends and Family: Not on file  . Frequency of Social Gatherings with Friends and Family: Not on file  . Attends Religious Services: Not on file  . Active Member of Clubs  or Organizations: Not on file  . Attends Archivist Meetings: Not on file  . Marital Status: Not on file  Intimate Partner Violence:   . Fear of Current or Ex-Partner: Not on file  . Emotionally Abused: Not on file  . Physically Abused: Not on file  . Sexually Abused: Not on file    Family History  Problem Relation Age of Onset  . Pancreatic cancer Mother   . Aortic aneurysm Maternal Grandmother   . Heart attack Maternal Grandfather   . Diabetes Paternal Grandfather   . CAD Paternal Grandfather     ROS- All systems are reviewed and negative except as per the HPI above  Physical Exam: Vitals:   08/17/20 0957  BP: 130/62  Pulse: (!) 52  Weight: 109.2 kg  Height: 6\' 1"  (1.854 m)   Wt Readings from Last 3 Encounters:  08/17/20 109.2 kg  07/20/20 108.4 kg  06/28/20 109.3 kg    Labs: Lab Results  Component Value Date   NA 140 07/13/2020   K 4.5 07/13/2020   CL 103 07/13/2020   CO2 25 07/13/2020   GLUCOSE 88 07/13/2020   BUN 19 07/13/2020   CREATININE 1.10 07/13/2020   CALCIUM 9.3 07/13/2020   MG 2.0 07/13/2018   Lab Results  Component Value Date   INR 1.1 01/19/2019   Lab Results  Component Value Date   CHOL 139 09/20/2019   HDL 52 09/20/2019   LDLCALC 74  09/20/2019   TRIG 64 09/20/2019     GEN- The patient is well appearing, alert and oriented x 3 today.   Head- normocephalic, atraumatic Eyes-  Sclera clear, conjunctiva pink Ears- hearing intact Oropharynx- clear Neck- supple, no JVP Lymph- no cervical lymphadenopathy Lungs- Clear to ausculation bilaterally, normal work of breathing Heart- Regular rate and rhythm, no murmurs, rubs or gallops, PMI not laterally displaced GI- soft, NT, ND, + BS Extremities- no clubbing, cyanosis, or edema MS- no significant deformity or atrophy Skin- no rash or lesion Psych- euthymic mood, full affect Neuro- strength and sensation are intact  EKG-Sinus Barret at 52 bpm with PAC's, pr int 154 ms, qrs int 114 ms, qtc 392 ms Epic records reviewed    Assessment and Plan: 1. Afib S/p ablation He is doing well staying in SR, but is having some symptomatic bradycardia in the am  Will reduce diltiazem to 120 mg at hs  2. CHA2DS2VASc score of at least 3 Continue xarelto 20 mg daily without any interruption during the 3 month recovery process   F/u with Dr. Rayann Heman 12/30  Geroge Baseman. Hilario Robarts, [facility] [address] [place], [place] [phone]

## 2020-08-17 NOTE — Patient Instructions (Signed)
Decrease cardizem to 120mg  once a day at bedtime

## 2020-08-23 DIAGNOSIS — H2513 Age-related nuclear cataract, bilateral: Secondary | ICD-10-CM | POA: Diagnosis not present

## 2020-09-02 ENCOUNTER — Other Ambulatory Visit: Payer: Self-pay | Admitting: Internal Medicine

## 2020-09-19 DIAGNOSIS — Z4789 Encounter for other orthopedic aftercare: Secondary | ICD-10-CM | POA: Diagnosis not present

## 2020-09-19 DIAGNOSIS — S46092D Other injury of muscle(s) and tendon(s) of the rotator cuff of left shoulder, subsequent encounter: Secondary | ICD-10-CM | POA: Diagnosis not present

## 2020-10-10 ENCOUNTER — Ambulatory Visit
Admission: RE | Admit: 2020-10-10 | Discharge: 2020-10-10 | Disposition: A | Discharge status: Home / Self Care | Payer: Medicare Other | Source: Ambulatory Visit | Attending: Family Medicine | Admitting: Family Medicine

## 2020-10-10 ENCOUNTER — Other Ambulatory Visit: Payer: Self-pay

## 2020-10-10 DIAGNOSIS — S22000A Wedge compression fracture of unspecified thoracic vertebra, initial encounter for closed fracture: Secondary | ICD-10-CM

## 2020-10-10 DIAGNOSIS — M85852 Other specified disorders of bone density and structure, left thigh: Secondary | ICD-10-CM | POA: Diagnosis not present

## 2020-10-16 ENCOUNTER — Other Ambulatory Visit: Payer: Self-pay

## 2020-10-16 ENCOUNTER — Ambulatory Visit (INDEPENDENT_AMBULATORY_CARE_PROVIDER_SITE_OTHER): Payer: Medicare Other | Admitting: Podiatry

## 2020-10-16 DIAGNOSIS — D689 Coagulation defect, unspecified: Secondary | ICD-10-CM

## 2020-10-16 DIAGNOSIS — L84 Corns and callosities: Secondary | ICD-10-CM

## 2020-10-17 NOTE — Progress Notes (Signed)
Subjective: 74 year old male presents the office today for follow-up evaluation of a pre-ulcerative callus on the left foot.  He has been using a moisturizer cream that included urea which has been helping.  Denies any opening, redness or drainage or any swelling. Denies any systemic concerns including fevers, chills, nausea, vomiting.  Objective: AAO x3, NAD DP/PT pulses palpable bilaterally, CRT less than 3 seconds Hyperkeratotic tissue with central area of skin breakdown on the medial left hallux IPJ and upon debridement there is no ongoing ulceration drainage or signs of infection.  There is no edema, erythema and signs of infection noted to the foot today.  No pain with calf compression, swelling, warmth, erythema  Assessment: Ulceration left hallux IPJ with resolved cellulitis  Plan: -All treatment options discussed with the patient including all alternatives, risks, complications.  -Debrided the hyperkeratotic tissue with any complications or bleeding.  Recommend moisturizer daily.  Continue offloading.  Monitor for skin breakdown or any recurrence of infection. -Continue inserts that he has well prevent pronation.  Trula Slade DPM

## 2020-10-18 ENCOUNTER — Ambulatory Visit (INDEPENDENT_AMBULATORY_CARE_PROVIDER_SITE_OTHER): Payer: Medicare Other | Admitting: Cardiovascular Disease

## 2020-10-18 ENCOUNTER — Encounter: Payer: Self-pay | Admitting: Cardiovascular Disease

## 2020-10-18 ENCOUNTER — Other Ambulatory Visit: Payer: Self-pay

## 2020-10-18 VITALS — BP 110/57 | HR 52 | Ht 73.0 in | Wt 243.6 lb

## 2020-10-18 DIAGNOSIS — E785 Hyperlipidemia, unspecified: Secondary | ICD-10-CM

## 2020-10-18 DIAGNOSIS — I480 Paroxysmal atrial fibrillation: Secondary | ICD-10-CM

## 2020-10-18 DIAGNOSIS — I77810 Thoracic aortic ectasia: Secondary | ICD-10-CM | POA: Diagnosis not present

## 2020-10-18 DIAGNOSIS — Z8679 Personal history of other diseases of the circulatory system: Secondary | ICD-10-CM

## 2020-10-18 DIAGNOSIS — I2510 Atherosclerotic heart disease of native coronary artery without angina pectoris: Secondary | ICD-10-CM

## 2020-10-18 DIAGNOSIS — G4733 Obstructive sleep apnea (adult) (pediatric): Secondary | ICD-10-CM | POA: Diagnosis not present

## 2020-10-18 DIAGNOSIS — Z9889 Other specified postprocedural states: Secondary | ICD-10-CM

## 2020-10-18 DIAGNOSIS — I10 Essential (primary) hypertension: Secondary | ICD-10-CM

## 2020-10-18 DIAGNOSIS — Q210 Ventricular septal defect: Secondary | ICD-10-CM | POA: Diagnosis not present

## 2020-10-18 DIAGNOSIS — Z9989 Dependence on other enabling machines and devices: Secondary | ICD-10-CM

## 2020-10-18 NOTE — Patient Instructions (Signed)
Medication Instructions:  No changes *If you need a refill on your cardiac medications before your next appointment, please call your pharmacy*   Lab Work: None ordered If you have labs (blood work) drawn today and your tests are completely normal, you will receive your results only by: Marland Kitchen MyChart Message (if you have MyChart) OR . A paper copy in the mail If you have any lab test that is abnormal or we need to change your treatment, we will call you to review the results.   Testing/Procedures: None ordered   Follow-Up: At [facility], you and your health needs are our priority.  As part of our continuing mission to provide you with exceptional heart care, we have created designated Provider Care Teams.  These Care Teams include your primary Cardiologist (physician) and Advanced Practice Providers (APPs -  Physician Assistants and Nurse Practitioners) who all work together to provide you with the care you need, when you need it.  We recommend signing up for the patient portal called "MyChart".  Sign up information is provided on this After Visit Summary.  MyChart is used to connect with patients for Virtual Visits (Telemedicine).  Patients are able to view lab/test results, encounter notes, upcoming appointments, etc.  Non-urgent messages can be sent to your provider as well.   To learn more about what you can do with MyChart, go to [email or web address].    Your next appointment:   12 months  The format for your next appointment:   In Person  Provider:   Shelva Majestic, MD   Other Instructions None

## 2020-10-21 ENCOUNTER — Encounter: Payer: Self-pay | Admitting: Cardiovascular Disease

## 2020-10-21 NOTE — Progress Notes (Signed)
Cardiology Office Note    Date:  10/21/2020   ID:  Jose Stone, DOB 24-Sep-1946, MRN [id number]  PCP:  Kathyrn Lass, MD  Cardiologist:  Shelva Majestic, MD   F/U sleep evaluation  History of Present Illness:  Jose Stone is a 74 y.o. male who presents to the office for 27-monthfollow-up evaluation.  Jose Stone followed by Dr. HDebara Pickettfor his primary cardiology care.  He has a history of previous documentation of sleep apnea over 10 years ago and apparently was on CPAP therapy for some time.  In August 2017, he underwent a reassessment of his sleep apnea with a sleep study and apparently on this study he was found to have increased upper airway resistance (U ARS) with an AHI of 2.5/h overall; however, there was moderate sleep apnea with REM sleep with an AHI of 26.7/h.  There was mild oxygen desaturation to a nadir of 86% and he snored with moderate snoring volume.  He also had increased periodic limb movement of sleep.  Apparently, because of his low AHI, he was not approved to resume CPAP therapy.  He is followed by Dr. HDebara Pickettand has a history of a small VSD for which she has undergone periodic echocardiographic assessment.  A CT scan showed an ectatic root measuring 3.8 cm.  He also had a history of atrial fibrillation with RVR with spontaneously converted and has a history of hypertension and dyslipidemia.  He was referred for repeat sleep study due to worsening snoring witnessed by his wife.  His sleep study was done on April 29, 2019 and during that evaluation he met split-night criteria.  Other diagnostic portion of the study, AHI was 7.7 and his RDI was 14.5/h.  CPAP was implemented and was titrated up to an optimal pressure at 12 cm of water.  He had loud snoring volume during the diagnostic portion of the study.  Oxygen saturation nadir was 90%.  Mr. BSchleicherwas set up with CPAP on June 23, 2019 with [facility] as his DME company.  A download was obtained in the office  from  August 22, 2019 through September 20, 2019 which confirms 100% usage.  He is averaging 6 hours and 17 minutes of CPAP use per night.  He states he typically goes to bed around 11 PM and oftentimes wakes up between 7 or 7:30 AM.  At a 12 cm water pressure, AHI is 5.0.  There are some nights with no leak but some nights with mild leak.  He has recently been trying the new ResMed F 30i mask which he believes is very comfortable and his leak is improved.  He states that at the beginning of the night, he does not feel like he is getting enough pressure.  He feels improved since reinitiating CPAP therapy with resolution of prior snoring.  He denies any daytime sleepiness.  An Epworth Sleepiness Scale was calculated in the office today and endorsed at 6 as shown below:  Epworth Sleepiness Scale: Situation   Chance of Dozing/Sleeping (0 = never , 1 = slight chance , 2 = moderate chance , 3 = high chance )   sitting and reading 1   watching TV 2   sitting inactive in a public place 0   being a passenger in a motor vehicle for an hour or more 0   lying down in the afternoon 3   sitting and talking to someone 0   sitting quietly after lunch (  no alcohol) 0   while stopped for a few minutes in traffic as the driver 0   Total Score  6   He was unaware of any bruxism, he denies painful restless legs, he denies any hypnagogic hallucinations or cataplectic events.  At his September 23, 2019 compliance evaluation he met compliance but in the early portion of the night he did not feel that he was getting adequate pressure.  At that time I discontinued his ramp time which was set at 45 minutes and changed him from a set pressure to an auto mode with a range of 10 to potentially 20 cm of water.  He was tolerating the new ResMed F 30i mask.  Over the previous year, he underwent rotator cuff surgery on his left shoulder and also had  atrial fibrillation/atrial flutter ablation in September 2021 by Dr. Rayann Stone.  He has  continued to use CPAP with excellent compliance.  A new download from November 15 through October 16, 2020 shows 100% use with average use at 7 hours and 33 minutes.  His 95th percentile pressure is 12 with a maximum average pressure of 13.2.  AHI is excellent at 1.6.  He does not have any mask leak.  He is unaware of breakthrough snoring, bruxism, restless legs.  He denies daytime sleepiness.  He presents for yearly evaluation.   Past Medical History:  Diagnosis Date  . Aortic root enlargement ([place])   . CAD (coronary artery disease)   . Complication of anesthesia    Hypotensive with general anesth.  . GERD (gastroesophageal reflux disease)   . Hyperlipidemia   . Hypertension   . Melanoma ([place])   . Persistent atrial fibrillation ([place])    Afib,Aflutter  . Typical atrial flutter ([place])   . 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 Ventricular septal defect     Past Surgical History:  Procedure Laterality Date  . ATRIAL FIBRILLATION ABLATION N/A 07/20/2020   Procedure: ATRIAL FIBRILLATION ABLATION;  Surgeon: [name], MD;  Location: [place] CV LAB;  Service: Cardiovascular;  Laterality: N/A;  . CARDIAC CATHETERIZATION    . CARDIOVERSION N/A 02/29/2020   Procedure: CARDIOVERSION;  Surgeon: [name], MD;  Location: [facility] ENDOSCOPY;  Service: Cardiovascular;  Laterality: N/A;  . CARDIOVERSION N/A 06/28/2020   Procedure: CARDIOVERSION;  Surgeon: Thayer Headings, MD;  Location: [facility] ENDOSCOPY;  Service: Cardiovascular;  Laterality: N/A;  . DISTAL INTERPHALANGEAL JOINT FUSION Left 01/19/2019   Procedure: HALLUX INTERPHANGEAL JOINT FUSION LEFT FOOT;  Surgeon: Trula Slade, DPM;  Location: [place];  Service: Podiatry;  Laterality: Left;  LEFT BLOCK  . hipospedious    . LEFT HEART CATHETERIZATION WITH CORONARY ANGIOGRAM N/A 08/14/2014   Procedure: LEFT HEART CATHETERIZATION WITH CORONARY ANGIOGRAM;  Surgeon: Birdie Riddle, MD;  Location: [place] CATH LAB;   Service: Cardiovascular;  Laterality: N/A;  . MASS EXCISION Left 01/19/2019   Procedure: EXCISION BENIGN LESION 2.OCM LEFT FOOT;  Surgeon: Trula Slade, DPM;  Location: [place];  Service: Podiatry;  Laterality: Left;  . SHOULDER ARTHROSCOPY WITH SUBACROMIAL DECOMPRESSION Left 05/03/2020   Procedure: SHOULDER ARTHROSCOPY DEBRIDEMENT PARTIAL ROTATOR CUFF REPAIR, SUBACROMIAL DECOMPRESSION, BICEPS TENOTOMY;  Surgeon: Tania Ade, MD;  Location: [facility] ORS;  Service: Orthopedics;  Laterality: Left;  . SHOULDER SURGERY    . TONSILLECTOMY  1955    Current Medications: Outpatient Medications Prior to Visit  Medication Sig Dispense Refill  . acetaminophen (TYLENOL) 500 MG tablet Take 1,000  mg by mouth every 6 (six) hours as needed for mild pain or headache.    . allopurinol (ZYLOPRIM) 100 MG tablet Take 200 mg by mouth daily.   0  . aspirin EC 81 MG tablet Take 81 mg by mouth at bedtime. Swallow whole.    Marland Kitchen atorvastatin (LIPITOR) 80 MG tablet TAKE 1 TABLET BY MOUTH DAILY AT 6 PM 90 tablet 3  . COLCRYS 0.6 MG tablet Take 0.6 mg by mouth daily as needed (FOR GOUT).     Marland Kitchen diltiazem (DILACOR XR) 120 MG 24 hr capsule Take 120 mg by mouth at bedtime.    Marland Kitchen loratadine (CLARITIN) 10 MG tablet Take 10 mg by mouth daily as needed for allergies.    . Multiple Vitamin (MULTIVITAMIN WITH MINERALS) TABS tablet Take 1 tablet by mouth daily. Centrum Silver    . nitroGLYCERIN (NITROSTAT) 0.4 MG SL tablet Place 1 tablet (0.4 mg total) under the tongue every 5 (five) minutes as needed for chest pain. Max 3 doses. 25 tablet 5  . Omega-3 Fat Ac-Cholecalciferol (DRY EYE OMEGA BENEFITS/VIT D-3 PO) Take 2 capsules by mouth in the morning and at bedtime.    Marland Kitchen omeprazole (PRILOSEC) 40 MG capsule Take 40 mg by mouth daily before breakfast. 30-60 minutes prior to breakfast.  3  . PARoxetine (PAXIL) 20 MG tablet Take 0.5 tablets (10 mg total) by mouth every other day. (Patient taking differently: Take 10 mg by mouth every other  day. At bedtime) 30 tablet 0  . Polyethyl Glycol-Propyl Glycol (SYSTANE OP) Apply 1 drop to eye 4 (four) times daily as needed (dry eyes).     . valsartan (DIOVAN) 160 MG tablet Take 1 tablet (160 mg total) by mouth daily. 135 tablet 1  . XARELTO 20 MG TABS tablet TAKE 1 TABLET BY MOUTH EVERY DAY WITH SUPPER (Patient taking differently: Take 20 mg by mouth daily with supper.) 90 tablet 3  . diltiazem (CARDIZEM CD) 120 MG 24 hr capsule TAKE 2 CAPSULES (240 MG TOTAL) BY MOUTH AT BEDTIME. 180 capsule 3  . testosterone cypionate (DEPOTESTOSTERONE CYPIONATE) 200 MG/ML injection Inject 100 mg into the muscle every 14 (fourteen) days. Saturdays. (Patient not taking: No sig reported)  1   No facility-administered medications prior to visit.     Allergies:   Codeine, Mupirocin, Hydrocodone, Neosporin [neomycin-bacitracin zn-polymyx], and Oxycodone   Social History   Socioeconomic History  . Marital status: Married    Spouse name: Not on file  . Number of children: 2  . Years of education: master's  . Highest education level: Not on file  Occupational History  . Occupation: Civil engineer, contracting    Comment: retired  Tobacco Use  . Smoking status: Never Smoker  . Smokeless tobacco: Never Used  Vaping Use  . Vaping Use: Never used  Substance and Sexual Activity  . Alcohol use: No  . Drug use: No  . Sexual activity: Yes    Birth control/protection: None  Other Topics Concern  . Not on file  Social History Narrative   Lives in [place] with spouse.   Retired Civil engineer, contracting   Social Determinants of Radio broadcast assistant Strain: Not on [employer] Insecurity: Not on file  Transportation Needs: Not on file  Physical Activity: Not on file  Stress: Not on file  Social Connections: Not on file    Socially he is married for 50 years.  He is a retired Civil engineer, contracting and retired at age 16.  He  has 2 children ages 40 and 44 and grandchildren.  Family History:  The patient's family history  includes Aortic aneurysm in his maternal grandmother; CAD in his paternal grandfather; Diabetes in his paternal grandfather; Heart attack in his maternal grandfather; Pancreatic cancer in his mother.   His father died at age 43 with "old age. "His mother died at 47 and had pancreatic cancer.  He has 2 sisters.  ROS General: Negative; No fevers, chills, or night sweats;  HEENT: Negative; No changes in vision or hearing, sinus congestion, difficulty swallowing Pulmonary: Negative; No cough, wheezing, shortness of breath, hemoptysis Cardiovascular: History of small perimembranous VSD; PAF; hypertension, hyperlipidemia; status post AF/flutter ablation GI: Negative; No nausea, vomiting, diarrhea, or abdominal pain GU: Negative; No dysuria, hematuria, or difficulty voiding Musculoskeletal: Negative; no myalgias, joint pain, or weakness Hematologic/Oncology: Negative; no easy bruising, bleeding Endocrine: Negative; no heat/cold intolerance; no diabetes Neuro: Negative; no changes in balance, headaches Skin: Negative; No rashes or skin lesions Psychiatric: Negative; No behavioral problems, depression Sleep: See HPI Other comprehensive 14 point system review is negative.   PHYSICAL EXAM:   VS:  BP (!) 110/57   Pulse (!) 52   Ht 6' 1"  (1.854 m)   Wt 243 lb 9.6 oz (110.5 kg)   SpO2 97%   BMI 32.14 kg/m     Repeat blood pressure by me was 136/74  Wt Readings from Last 3 Encounters:  10/18/20 243 lb 9.6 oz (110.5 kg)  08/17/20 240 lb 12.8 oz (109.2 kg)  07/20/20 239 lb (108.4 kg)     General: Alert, oriented, no distress.  Skin: normal turgor, no rashes, warm and dry HEENT: Normocephalic, atraumatic. Pupils equal round and reactive to light; sclera anicteric; extraocular muscles intact;  Nose without nasal septal hypertrophy Mouth/Parynx benign; Mallinpatti scale 4 Neck: No JVD, no carotid bruits; normal carotid upstroke Lungs: clear to ausculatation and percussion; no wheezing or  rales Chest wall: without tenderness to palpitation Heart: PMI not displaced, RRR, s1 s2 normal, 2/6 systolic murmur left sternal border consistent with his VSD, no diastolic murmur, no rubs, gallops, thrills, or heaves Abdomen: soft, nontender; no hepatosplenomehaly, BS+; abdominal aorta nontender and not dilated by palpation. Back: no CVA tenderness Pulses 2+ Musculoskeletal: full range of motion, normal strength, no joint deformities Extremities: no clubbing cyanosis or edema, Homan's sign negative  Neurologic: grossly nonfocal; Cranial nerves grossly wnl Psychologic: Normal mood and affect   Studies/Labs Reviewed:   EKG:  EKG is ordered today. ECG (independently read by me): Sinus bradycardia at 52 bpm, PAC, incomplete right bundle branch block  September 23, 2019 ECG (independently read by me): Normal sinus rhythm with PAC mild RV conduction delay.  Normal intervals  Recent Labs: BMP Latest Ref Rng & Units 07/13/2020 06/01/2020 04/24/2020  Glucose 65 - 99 mg/dL 88 115(H) 94  BUN 8 - 27 mg/dL 19 22 29(H)  Creatinine 0.76 - 1.27 mg/dL 1.10 1.22 1.12  BUN/Creat Ratio 10 - 24 17 - -  Sodium 134 - 144 mmol/L 140 133(L) 142  Potassium 3.5 - 5.2 mmol/L 4.5 4.1 4.9  Chloride 96 - 106 mmol/L 103 99 107  CO2 20 - 29 mmol/L 25 22 25   Calcium 8.6 - 10.2 mg/dL 9.3 8.8(L) 9.4     Hepatic Function Latest Ref Rng & Units 06/01/2020 07/13/2018 11/18/2015  Total Protein 6.5 - 8.1 g/dL 7.2 6.2(L) 6.3(L)  Albumin 3.5 - 5.0 g/dL 3.9 3.7 3.3(L)  AST 15 - 41 U/L 21 56(H) 31  ALT 0 - 44 U/L 25 24 24   Alk Phosphatase 38 - 126 U/L 85 67 86  Total Bilirubin 0.3 - 1.2 mg/dL 0.7 1.8(H) 0.4    CBC Latest Ref Rng & Units 07/13/2020 06/01/2020 04/24/2020  WBC 3.4 - 10.8 x10E3/uL 8.2 9.6 7.3  Hemoglobin 13.0 - 17.7 g/dL 13.4 14.2 13.9  Hematocrit 37.5 - 51.0 % 41.1 44.4 44.3  Platelets 150 - 450 x10E3/uL 259 236 220   Lab Results  Component Value Date   MCV 75 (L) 07/13/2020   MCV 78.7 (L) 06/01/2020    MCV 81.4 04/24/2020   Lab Results  Component Value Date   TSH 4.570 (H) 08/12/2014   No results found for: HGBA1C   BNP No results found for: BNP  ProBNP    Component Value Date/Time   PROBNP 193.9 (H) 08/12/2014 1614     Lipid Panel     Component Value Date/Time   CHOL 139 09/20/2019 0933   CHOL 131 01/22/2015 0812   TRIG 64 09/20/2019 0933   TRIG 93 01/22/2015 0812   HDL 52 09/20/2019 0933   HDL 49 01/22/2015 0812   CHOLHDL 2.7 09/20/2019 0933   LDLCALC 74 09/20/2019 0933   LDLCALC 63 01/22/2015 0812   LABVLDL 13 09/20/2019 0933     RADIOLOGY: DG BONE DENSITY (DXA)  Result Date: 10/10/2020 EXAM: DUAL X-RAY ABSORPTIOMETRY (DXA) FOR BONE MINERAL DENSITY IMPRESSION: Referring Physician:  Kathyrn Lass Your patient completed a BMD test using Lunar IDXA DXA system ( analysis version: 16 ) manufactured by [employer]. Technologist: AW PATIENT: Name: Zian, Delair Patient ID: [id number] Birth Date: 07/09/1946 Height: 72.5 in. Sex: Male Measured: 10/10/2020 Weight: 239.8 lbs. Indications: Advanced Age, Caucasian, History of Fracture (Adult) (V15.51) Fractures: Left Ankle, Left wrist, vertebrae Treatments: Calcium (E943.0), Vitamin D (E933.5) ASSESSMENT: The BMD measured at Femur Neck Left is 1.107 g/cm2 with a T-score of 0.5. This patient is considered normal according to the [facility]) criteria. The scan quality is good. L-3 and L-4 were excluded due to degenerative changes. Site Region Measured Date Measured Age YA BMD Significant CHANGE T-score DualFemur Neck Left  10[age]10/2020    [age]         0.5     1.107 g/cm2 AP Spine  L1-L2      10[age]10/2020    [age]         1.9     1.395 g/cm2 DualFemur Total Mean 10[age]10/2020    [age]         1.4     1.181 g/cm2 World Health Organization [place]) criteria for post-menopausal, Caucasian Women: Normal       T-score at or above -1 SD Osteopenia   T-score between -1 and -2.5 SD Osteoporosis T-score at or below -2.5 SD RECOMMENDATION:  1. All patients should optimize calcium and vitamin D intake. 2. Consider FDA approved medical therapies in postmenopausal women and men aged 11 years and older, based on the following: a. A hip or vertebral (clinical or morphometric) fracture b. T- score < or = -2.5 at the femoral neck or spine after appropriate evaluation to exclude secondary causes c. Low bone mass (T-score between -1.0 and -2.5 at the femoral neck or spine) and a 10 year probability of a hip fracture > or = 3% or a 10 year probability of a major osteoporosis-related fracture > or = 20% based on the [place]-adapted WHO algorithm d. Clinician judgment and/or patient preferences may indicate treatment for people with  10-year fracture probabilities above or below these levels FOLLOW-UP: Patients with diagnosis of osteoporosis or at high risk for fracture should have regular bone mineral density tests. For patients eligible for Medicare, routine testing is allowed once every 2 years. The testing frequency can be increased to one year for patients who have rapidly progressing disease, those who are receiving or discontinuing medical therapy to restore bone mass, or have additional risk factors. I have reviewed this report and agree with the above findings. [facility] Radiology Electronically Signed   By: Rolm Baptise M.D.   On: 10/10/2020 17:00     Additional studies/ records that were reviewed today include:  I reviewed the records of Dr. [name], the patient's 2017 sleep study, 2020 sleep study, and obtained a download from October 19 through September 20, 2019.  New download was obtained from September 17, 2020 through October 16, 2020. Records of Dr. Rayann Stone were reviewed   ASSESSMENT:    1. OSA on CPAP   2. PAF (paroxysmal atrial fibrillation) ([place])   3. Essential hypertension   4. VSD (ventricular septal defect)   5. S/P ablation of atrial fibrillation   6. CAD in native artery   7. Dyslipidemia   8. Dilated aortic root [facility])      PLAN:  Mr. 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 [name] and has a history of a small perimembranous VSD, hypertension, CAD, PAF on anticoagulation therapy, hyperlipidemia, and mild ectatic dilated aortic root.  Remotely he was diagnosed with sleep apnea over 11 years ago and for period of time was on CPAP therapy.  On his sleep study in 2017 he was found to have moderate sleep apnea during REM sleep but overall his AHI was only 2.5/h and he was not approved to reinstitute CPAP therapy.  A repeat evaluation in 2020 again confirmed obstructive sleep apnea.  CPAP therapy was resumed in and when seen for initial compliance evaluation in November 2020 he was doing well.  However, his ramp time was prolonged and he was not feeling that he was getting adequate air in the early portion of the night.  As result I discontinued his ramp time.  I changed him to an auto mode.  His most recent download from September 17, 2020 through October 16, 2020 continues to show excellent compliance.  With his pressure range from 10 up to 20 cm, AHI is excellent at 1.6.  His 95th percentile pressure is 12.0 and maximum average pressure 13.2.  He denies any residual daytime sleepiness or breakthrough snoring.  He is sleeping well.Marland Kitchen  He underwent successful rotator cuff surgery of his left shoulder and also underwent atrial fibrillation/flutter ablation in September by Dr. Rayann Stone.  He has a documented VSD and also has left-sided varicose veins.  Blood pressure today is stable.  He has been trying to lose weight.  Recent hemoglobin A1c was 6.4.  He continues to be on valsartan 160 mg daily and Cardizem CD 120 mg for hypertension.  He is anticoagulated on Xarelto.  He is on atorvastatin for hyperlipidemia.  I will see him in 1 year for reevaluation or sooner as needed.   Medication Adjustments/Labs and Tests Ordered: Current medicines are reviewed at length with the patient today.  Concerns  regarding medicines are outlined above.  Medication changes, Labs and Tests ordered today are listed in the Patient Instructions below. Patient Instructions  Medication Instructions:  No changes *If you need a refill on your cardiac  medications before your next appointment, please call your pharmacy*   Lab Work: None ordered If you have labs (blood work) drawn today and your tests are completely normal, you will receive your results only by: Marland Kitchen MyChart Message (if you have MyChart) OR . A paper copy in the mail If you have any lab test that is abnormal or we need to change your treatment, we will call you to review the results.   Testing/Procedures: None ordered   Follow-Up: At [facility], you and your health needs are our priority.  As part of our continuing mission to provide you with exceptional heart care, we have created designated Provider Care Teams.  These Care Teams include your primary Cardiologist (physician) and Advanced Practice Providers (APPs -  Physician Assistants and Nurse Practitioners) who all work together to provide you with the care you need, when you need it.  We recommend signing up for the patient portal called "MyChart".  Sign up information is provided on this After Visit Summary.  MyChart is used to connect with patients for Virtual Visits (Telemedicine).  Patients are able to view lab/test results, encounter notes, upcoming appointments, etc.  Non-urgent messages can be sent to your provider as well.   To learn more about what you can do with MyChart, go to [email or web address].    Your next appointment:   12 months  The format for your next appointment:   In Person  Provider:   Shelva Majestic, MD   Other Instructions None     Signed, Shelva Majestic, MD  10/21/2020 10:23 AM    [facility] [address], [place], [place], [place]  [address] Phone: [phone]

## 2020-10-22 ENCOUNTER — Other Ambulatory Visit: Payer: Self-pay

## 2020-10-22 ENCOUNTER — Encounter: Payer: Self-pay | Admitting: Internal Medicine

## 2020-10-22 ENCOUNTER — Ambulatory Visit (INDEPENDENT_AMBULATORY_CARE_PROVIDER_SITE_OTHER): Payer: Medicare Other | Admitting: Internal Medicine

## 2020-10-22 VITALS — BP 116/68 | HR 61 | Ht 73.0 in | Wt 245.8 lb

## 2020-10-22 DIAGNOSIS — I4819 Other persistent atrial fibrillation: Secondary | ICD-10-CM

## 2020-10-22 DIAGNOSIS — I2510 Atherosclerotic heart disease of native coronary artery without angina pectoris: Secondary | ICD-10-CM | POA: Diagnosis not present

## 2020-10-22 NOTE — Progress Notes (Signed)
PCP: Kathyrn Lass, MD Primary Cardiologist: Dr Aura Dials Jose Stone is a 74 y.o. male who presents today for routine electrophysiology followup.  Since his recent afib ablation, the patient reports doing very well.  he denies procedure related complications and is pleased with the results of the procedure.  Today, he denies symptoms of palpitations, chest pain, shortness of breath,  lower extremity edema, dizziness, presyncope, or syncope.  The patient is otherwise without complaint today.   Past Medical History:  Diagnosis Date  . Aortic root enlargement ([place])   . CAD (coronary artery disease)   . Complication of anesthesia    Hypotensive with general anesth.  . GERD (gastroesophageal reflux disease)   . Hyperlipidemia   . Hypertension   . Melanoma ([place])   . Persistent atrial fibrillation ([place])    Afib,Aflutter  . Typical atrial flutter ([place])   . 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 Ventricular septal defect    Past Surgical History:  Procedure Laterality Date  . ATRIAL FIBRILLATION ABLATION N/A 07/20/2020   Procedure: ATRIAL FIBRILLATION ABLATION;  Surgeon: [name], MD;  Location: [place] CV LAB;  Service: Cardiovascular;  Laterality: N/A;  . CARDIAC CATHETERIZATION    . CARDIOVERSION N/A 02/29/2020   Procedure: CARDIOVERSION;  Surgeon: [name], MD;  Location[facility] END[facility]COPY;  Service: Cardiovascular;  Laterality: N/A;  . CARDIOVERSION N/A 06/28/2020   Procedure: CARDIOVERSION;  Surgeon: Thayer Headings, MD;  Location: [facility] ENDOSCOPY;  Service: Cardiovascular;  Laterality: N/A;  . DISTAL INTERPHALANGEAL JOINT FUSION Left 01/19/2019   Procedure: HALLUX INTERPHANGEAL JOINT FUSION LEFT FOOT;  Surgeon: Trula Slade, DPM;  Location: [place];  Service: Podiatry;  Laterality: Left;  LEFT BLOCK  . hipospedious    . LEFT HEART CATHETERIZATION WITH CORONARY ANGIOGRAM N/A 08/14/2014   Procedure: LEFT HEART CATHETERIZATION WITH CORONARY  ANGIOGRAM;  Surgeon: Birdie Riddle, MD;  Location: [place] CATH LAB;  Service: Cardiovascular;  Laterality: N/A;  . MASS EXCISION Left 01/19/2019   Procedure: EXCISION BENIGN LESION 2.OCM LEFT FOOT;  Surgeon: Trula Slade, DPM;  Location: [place];  Service: Podiatry;  Laterality: Left;  . SHOULDER ARTHROSCOPY WITH SUBACROMIAL DECOMPRESSION Left 05/03/2020   Procedure: SHOULDER ARTHROSCOPY DEBRIDEMENT PARTIAL ROTATOR CUFF REPAIR, SUBACROMIAL DECOMPRESSION, BICEPS TENOTOMY;  Surgeon: Tania Ade, MD;  Location: [facility];  Service: Orthopedics;  Laterality: Left;  . SHOULDER SURGERY    . TONSILLECTOMY  1955    ROS- all systems are personally reviewed and negatives except as per HPI above  Current Outpatient Medications  Medication Sig Dispense Refill  . acetaminophen (TYLENOL) 500 MG tablet Take 1,000 mg by mouth every 6 (six) hours as needed for mild pain or headache.    . allopurinol (ZYLOPRIM) 100 MG tablet Take 200 mg by mouth daily.   0  . aspirin EC 81 MG tablet Take 81 mg by mouth at bedtime. Swallow whole.    Marland Kitchen atorvastatin (LIPITOR) 80 MG tablet TAKE 1 TABLET BY MOUTH DAILY AT 6 PM 90 tablet 3  . COLCRYS 0.6 MG tablet Take 0.6 mg by mouth daily as needed (FOR GOUT).     Marland Kitchen diltiazem (DILACOR XR) 120 MG 24 hr capsule Take 120 mg by mouth at bedtime.    Marland Kitchen ketotifen (ZADITOR) 0.025 % ophthalmic solution Place 1 drop into both eyes as needed for allergies.    Sunday Corn Mineral Oil-Mineral Oil (RETAINE MGD) 0.5-0.5 % EMUL Place 1 drop into  both eyes in the morning, at noon, and at bedtime.    Marland Kitchen loratadine (CLARITIN) 10 MG tablet Take 10 mg by mouth daily as needed for allergies.    . Multiple Vitamin (MULTIVITAMIN WITH MINERALS) TABS tablet Take 1 tablet by mouth daily. Centrum Silver    . nitroGLYCERIN (NITROSTAT) 0.4 MG SL tablet Place 1 tablet (0.4 mg total) under the tongue every 5 (five) minutes as needed for chest pain. Max 3 doses. 25 tablet 5  . Omega-3 Fat Ac-Cholecalciferol (DRY EYE  OMEGA BENEFITS/VIT D-3 PO) Take 2 capsules by mouth in the morning and at bedtime.    Marland Kitchen omeprazole (PRILOSEC) 40 MG capsule Take 40 mg by mouth daily before breakfast. 30-60 minutes prior to breakfast.  3  . PARoxetine (PAXIL) 20 MG tablet Take 0.5 tablets (10 mg total) by mouth every other day. 30 tablet 0  . Polyethyl Glycol-Propyl Glycol (SYSTANE OP) Apply 1 drop to eye 4 (four) times daily as needed (dry eyes).     . sildenafil (REVATIO) 20 MG tablet Take 1 tablet by mouth as needed for erectile dysfunction.    . valsartan (DIOVAN) 160 MG tablet Take 1 tablet (160 mg total) by mouth daily. 135 tablet 1  . XARELTO 20 MG TABS tablet TAKE 1 TABLET BY MOUTH EVERY DAY WITH SUPPER 90 tablet 3   No current facility-administered medications for this visit.    Physical Exam: Vitals:   10/22/20 1055  BP: 116/68  Pulse: 61  SpO2: 98%  Weight: 245 lb 12.8 oz (111.5 kg)  Height: 6\' 1"  (1.854 m)    GEN- The patient is well appearing, alert and oriented x 3 today.   Head- normocephalic, atraumatic Eyes-  Sclera clear, conjunctiva pink Ears- hearing intact Oropharynx- clear Lungs- Clear to ausculation bilaterally, normal work of breathing Heart- Regular rate and rhythm, no murmurs, rubs or gallops, PMI not laterally displaced GI- soft, NT, ND, + BS Extremities- no clubbing, cyanosis, or edema  EKG tracing ordered today is personally reviewed and shows sinus with PACs/PCAS, rsr'  Assessment and Plan:  1. Persistent atrial fibrillation/ atrial flutter Doing well s/p ablation chads2vasc score is 3.  He is on xarelto Stop diltiazem  2. OSA Uses CPAP  3. Vagal syncope Chronic and stable He is on paxil for this We discussed possibly stopping paxil.  He will think about this further  4. CAD No ischemic symptoms Stop ASA  5. Obesity Body mass index is 32.43 kg/m. Lifestyle modification is advised  Return to see me in 3 months  [name] MD, [facility] 10/22/2020 11:38  AM

## 2020-10-22 NOTE — Patient Instructions (Addendum)
Medication Instructions:  1 stop your Aspirin 2 stop your diltiazem   *If you need a refill on your cardiac medications before your next appointment, please call your pharmacy*  Lab Work: None ordered.  If you have labs (blood work) drawn today and your tests are completely normal, you will receive your results only by: Marland Kitchen MyChart Message (if you have MyChart) OR . A paper copy in the mail If you have any lab test that is abnormal or we need to change your treatment, we will call you to review the results.  Testing/Procedures: None ordered.  Follow-Up: At [facility], you and your health needs are our priority.  As part of our continuing mission to provide you with exceptional heart care, we have created designated Provider Care Teams.  These Care Teams include your primary Cardiologist (physician) and Advanced Practice Providers (APPs -  Physician Assistants and Nurse Practitioners) who all work together to provide you with the care you need, when you need it.  We recommend signing up for the patient portal called "MyChart".  Sign up information is provided on this After Visit Summary.  MyChart is used to connect with patients for Virtual Visits (Telemedicine).  Patients are able to view lab/test results, encounter notes, upcoming appointments, etc.  Non-urgent messages can be sent to your provider as well.   To learn more about what you can do with MyChart, go to [email or web address].    Your next appointment:   Your physician wants you to follow-up in: 3 months with Dr. Rayann Heman.    Other Instructions:

## 2020-11-07 ENCOUNTER — Other Ambulatory Visit: Payer: Self-pay | Admitting: Internal Medicine

## 2020-11-07 NOTE — Addendum Note (Signed)
Addended by: Brunetta Genera on: 11/07/2020 02:45 PM   Modules accepted: Orders

## 2020-11-14 ENCOUNTER — Encounter: Payer: Self-pay | Admitting: Internal Medicine

## 2020-11-14 ENCOUNTER — Telehealth (INDEPENDENT_AMBULATORY_CARE_PROVIDER_SITE_OTHER): Payer: Medicare Other | Admitting: Internal Medicine

## 2020-11-14 VITALS — BP 122/76 | HR 60 | Ht 73.0 in | Wt 245.0 lb

## 2020-11-14 DIAGNOSIS — Z8679 Personal history of other diseases of the circulatory system: Secondary | ICD-10-CM

## 2020-11-14 DIAGNOSIS — I480 Paroxysmal atrial fibrillation: Secondary | ICD-10-CM | POA: Diagnosis not present

## 2020-11-14 DIAGNOSIS — I2510 Atherosclerotic heart disease of native coronary artery without angina pectoris: Secondary | ICD-10-CM | POA: Diagnosis not present

## 2020-11-14 DIAGNOSIS — Z9989 Dependence on other enabling machines and devices: Secondary | ICD-10-CM

## 2020-11-14 DIAGNOSIS — Q210 Ventricular septal defect: Secondary | ICD-10-CM | POA: Diagnosis not present

## 2020-11-14 DIAGNOSIS — I77810 Thoracic aortic ectasia: Secondary | ICD-10-CM | POA: Diagnosis not present

## 2020-11-14 DIAGNOSIS — I10 Essential (primary) hypertension: Secondary | ICD-10-CM | POA: Diagnosis not present

## 2020-11-14 DIAGNOSIS — Z9889 Other specified postprocedural states: Secondary | ICD-10-CM

## 2020-11-14 DIAGNOSIS — G4733 Obstructive sleep apnea (adult) (pediatric): Secondary | ICD-10-CM | POA: Diagnosis not present

## 2020-11-14 NOTE — Progress Notes (Signed)
Virtual Visit via Video Note   This visit type was conducted due to national recommendations for restrictions regarding the COVID-19 Pandemic (e.g. social distancing) in an effort to limit this patient's exposure and mitigate transmission in our community.  Due to his co-morbid illnesses, this patient is at least at moderate risk for complications without adequate follow up.  This format is felt to be most appropriate for this patient at this time.  All issues noted in this document were discussed and addressed.  A limited physical exam was performed with this format.  Please refer to the patient's chart for his consent to telehealth for [facility].   Evaluation Performed:  Caregility video visit  Date:  11/14/2020   ID:  Jose Stone, DOB Jan 20, 1946, MRN [id number]  Patient Location:  [place] [address]  Provider location:   [address], [address] [place], [place] [address]  PCP:  Kathyrn Lass, MD  Cardiologist:  [name], MD Electrophysiologist:  None   Chief Complaint:  Follow-up  History of Present Illness:    Jose Stone is a 75 y.o. male who presents via audio/video conferencing for a telehealth visit today.  Jose Stone returns today for follow-up.  Overall he is feeling well.  He denies any chest pain or worsening shortness of breath.  When I last saw him by video visit in the spring we ordered a repeat CT scan for a dilated aorta.  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  There is no clear aneurysm.  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  Despite this, he denies any shortness of breath or associated symptoms.  We will likely want to repeat his echo next year again to look for changes possibly related to the VSD and/or monitoring and evaluation of pulmonary hypertension.  He recently  had received new CPAP equipment and has been using it.  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  He also had a brow lift/blepharoplasty for issues with difficulty seeing.  11/14/2020  Jose Stone returns today for follow-up.  This is a video visit.  He is overall doing well.  He had A. fib ablation in the fall with Dr. Rayann Heman and since has done very well.  He is not symptomatic.  He denies any recurrence.  He recently saw Dr. Already took him off of aspirin and diltiazem.  He remains on Xarelto for anticoagulation.  He does have a history of coronary disease.  He had also seen Dr. Claiborne Billings who had been working with him on his CPAP equipment.  More recently Jose Stone had an elevated hemoglobin A1c of 6.4 and is now undergoing some education with nutrition and working on dietary changes and an attempt at weight loss to help mitigate this.  The patient does not have symptoms concerning for COVID-19 infection (fever, chills, cough, or new SHORTNESS OF BREATH).    Prior CV studies:   The following studies were reviewed today:  Chart reviewed Lab work  PMHx:  Past Medical History:  Diagnosis Date  . Aortic root enlargement ([place])   . CAD (coronary artery disease)   . Complication of anesthesia    Hypotensive with general anesth.  . GERD (gastroesophageal reflux disease)   . Hyperlipidemia   .  Hypertension   . Melanoma ([place])   . Persistent atrial fibrillation ([place])    Afib,Aflutter  . Typical atrial flutter ([place])   . 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 Ventricular septal defect     Past Surgical History:  Procedure Laterality Date  . ATRIAL FIBRILLATION ABLATION N/A 07/20/2020   Procedure: ATRIAL FIBRILLATION ABLATION;  Surgeon: [name], MD;  Location: [place] CV LAB;  Service: Cardiovascular;  Laterality: N/A;  . CARDIAC CATHETERIZATION    . CARDIOVERSION N/A 02/29/2020   Procedure: CARDIOVERSION;  Surgeon:  [name], MD;  Location: [facility] ENDOSCOPY;  Service: Cardiovascular;  Laterality: N/A;  . CARDIOVERSION N/A 06/28/2020   Procedure: CARDIOVERSION;  Surgeon: Thayer Headings, MD;  Location: [facility] ENDOSCOPY;  Service: Cardiovascular;  Laterality: N/A;  . DISTAL INTERPHALANGEAL JOINT FUSION Left 01/19/2019   Procedure: HALLUX INTERPHANGEAL JOINT FUSION LEFT FOOT;  Surgeon: Trula Slade, DPM;  Location: [place];  Service: Podiatry;  Laterality: Left;  LEFT BLOCK  . hipospedious    . LEFT HEART CATHETERIZATION WITH CORONARY ANGIOGRAM N/A 08/14/2014   Procedure: LEFT HEART CATHETERIZATION WITH CORONARY ANGIOGRAM;  Surgeon: Birdie Riddle, MD;  Location: [place] CATH LAB;  Service: Cardiovascular;  Laterality: N/A;  . MASS EXCISION Left 01/19/2019   Procedure: EXCISION BENIGN LESION 2.OCM LEFT FOOT;  Surgeon: Trula Slade, DPM;  Location: [place];  Service: Podiatry;  Laterality: Left;  . SHOULDER ARTHROSCOPY WITH SUBACROMIAL DECOMPRESSION Left 05/03/2020   Procedure: SHOULDER ARTHROSCOPY DEBRIDEMENT PARTIAL ROTATOR CUFF REPAIR, SUBACROMIAL DECOMPRESSION, BICEPS TENOTOMY;  Surgeon: Tania Ade, MD;  Location: [facility];  Service: Orthopedics;  Laterality: Left;  . SHOULDER SURGERY    . TONSILLECTOMY  1955    FAMHx:  Family History  Problem Relation Age of Onset  . Pancreatic cancer Mother   . Aortic aneurysm Maternal Grandmother   . Heart attack Maternal Grandfather   . Diabetes Paternal Grandfather   . CAD Paternal Grandfather     SOCHx:   reports that he has never smoked. He has never used smokeless tobacco. He reports that he does not drink alcohol and does not use drugs.  ALLERGIES:  Allergies  Allergen Reactions  . Codeine Nausea Only    Can tolerate with benadryl  . Mupirocin Itching  . Hydrocodone Rash    Can tolerate with benadryl  . Neosporin [Neomycin-Bacitracin Zn-Polymyx] Rash  . Oxycodone Rash    Can tolerate with benadryl    MEDS:  Current Meds  Medication Sig   . acetaminophen (TYLENOL) 500 MG tablet Take 1,000 mg by mouth every 6 (six) hours as needed for mild pain or headache.  . alendronate (FOSAMAX) 70 MG tablet Take 70 mg by mouth once a week. Take with a full glass of water on an empty stomach.  Marland Kitchen allopurinol (ZYLOPRIM) 100 MG tablet Take 200 mg by mouth daily.   Marland Kitchen atorvastatin (LIPITOR) 80 MG tablet TAKE 1 TABLET BY MOUTH DAILY AT 6 PM  . COLCRYS 0.6 MG tablet Take 0.6 mg by mouth daily as needed (FOR GOUT).   Marland Kitchen ketotifen (ZADITOR) 0.025 % ophthalmic solution Place 1 drop into both eyes as needed for allergies.  Sunday Corn Mineral Oil-Mineral Oil (RETAINE MGD) 0.5-0.5 % EMUL Place 1 drop into both eyes in the morning, at noon, and at bedtime.  Marland Kitchen loratadine (CLARITIN) 10 MG tablet Take 10 mg by mouth daily as needed for allergies.  . Multiple Vitamin (MULTIVITAMIN WITH MINERALS) TABS  tablet Take 1 tablet by mouth daily. Centrum Silver  . nitroGLYCERIN (NITROSTAT) 0.4 MG SL tablet Place 1 tablet (0.4 mg total) under the tongue every 5 (five) minutes as needed for chest pain. Max 3 doses.  . Omega-3 Fat Ac-Cholecalciferol (DRY EYE OMEGA BENEFITS/VIT D-3 PO) Take 2 capsules by mouth in the morning and at bedtime.  Marland Kitchen omeprazole (PRILOSEC) 40 MG capsule Take 40 mg by mouth daily before breakfast. 30-60 minutes prior to breakfast.  . PARoxetine (PAXIL) 20 MG tablet Take 0.5 tablets (10 mg total) by mouth every other day.  . sildenafil (REVATIO) 20 MG tablet Take 1 tablet by mouth as needed for erectile dysfunction.  . valsartan (DIOVAN) 160 MG tablet TAKE 1 TABLET BY MOUTH EVERY DAY  . XARELTO 20 MG TABS tablet TAKE 1 TABLET BY MOUTH EVERY DAY WITH SUPPER     ROS: Pertinent items noted in HPI and remainder of comprehensive ROS otherwise negative.  Labs/Other Tests and Data Reviewed:    Recent Labs: 06/01/2020: ALT 25 07/13/2020: BUN 19; Creatinine, Ser 1.10; Hemoglobin 13.4; Platelets 259; Potassium 4.5; Sodium 140   Recent Lipid Panel Lab Results   Component Value Date/Time   CHOL 139 09/20/2019 09:33 AM   CHOL 131 01/22/2015 08:12 AM   TRIG 64 09/20/2019 09:33 AM   TRIG 93 01/22/2015 08:12 AM   HDL 52 09/20/2019 09:33 AM   HDL 49 01/22/2015 08:12 AM   CHOLHDL 2.7 09/20/2019 09:33 AM   LDLCALC 74 09/20/2019 09:33 AM   LDLCALC 63 01/22/2015 08:12 AM    Wt Readings from Last 3 Encounters:  11/14/20 245 lb (111.1 kg)  10/22/20 245 lb 12.8 oz (111.5 kg)  10/18/20 243 lb 9.6 oz (110.5 kg)     Exam:    Vital Signs:  BP 122/76   Pulse 60   Ht 6\' 1"  (1.854 m)   Wt 245 lb (111.1 kg)   SpO2 96%   BMI 32.32 kg/m    General appearance: alert, no distress and Bandage across his forehead Lungs: No audible wheezing Abdomen: Mildly overweight Skin: Skin color, texture, turgor normal. No rashes or lesions Neurologic: Mental status: Alert, oriented, thought content appropriate Psych: Pleasant  ASSESSMENT & PLAN:    1. A. fib with RVR-spontaneously converted, possibly related to ischemia (not on triple anticoagulation) 2. Chest pain resolved- mildly abnormal nuclear stress test, normal LVEF 60-65% on echo/normal wall motion -recent A. fib ablation (07/2020) 3. Paroxysmal atrial flutter - CHADSVASC score of 1 on Xarelto 4. Coronary artery disease status post PCI to the OM with a Xience Alpine DES (2.515 mm) - on low dose Aspirin 5. Residual moderate to severe mid LAD disease 6. Hypertension 7. Dyslipidemia 8. Small perimembranous VSD 9. Fatigue-possibly symptomatic bradycardia  10. Trivial AI 11. 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 Low testosterone 13. OSA on CPAP  Mr. Gaebler had recent A. fib ablation which has been successful and he feels well.  He is now fighting with weight gain and impaired fasting glucose with A1c of 6.4.  He is working to try to lose weight and get those numbers down.  He was recently reestablished on CPAP therapy after changes with Dr. Claiborne Billings.  He did have a history of dilated aortic root to 4.1 cm  but recent CT prior to his A. fib ablation showed this measured 3.8 cm.  No medication changes today.  Follow-up with me in 6 months or sooner as necessary.  COVID-19 Education: The signs and symptoms  of COVID-19 were discussed with the patient and how to seek care for testing (follow up with PCP or arrange E-visit).  The importance of social distancing was discussed today.  Patient Risk:   After full review of this patients clinical status, I feel that they are at least moderate risk at this time.  Time:   Today, I have spent 25 minutes with the patient with telehealth technology discussing dyslipidemia, hypertension, A. fib, coronary disease, VSD, recent CT scan, dilated aortic root, OSA.     Medication Adjustments/Labs and Tests Ordered: Current medicines are reviewed at length with the patient today.  Concerns regarding medicines are outlined above.   Tests Ordered: No orders of the defined types were placed in this encounter.   Medication Changes: No orders of the defined types were placed in this encounter.   Disposition:  in 6 month(s)  [name], MD, [facility] [place] Director of the Advanced Lipid Disorders &  Cardiovascular Risk Reduction Clinic Diplomate of the American Board of Clinical Lipidology Attending Cardiologist  Direct Dial: [phone]  Fax: [phone]  Website:  [email or web address]  [name], MD  11/14/2020 11:06 AM

## 2020-11-14 NOTE — Patient Instructions (Signed)
Medication Instructions:  No Changes In Medications at this time.  *If you need a refill on your cardiac medications before your next appointment, please call your pharmacy*  Follow-Up: At [facility], you and your health needs are our priority.  As part of our continuing mission to provide you with exceptional heart care, we have created designated Provider Care Teams.  These Care Teams include your primary Cardiologist (physician) and Advanced Practice Providers (APPs -  Physician Assistants and Nurse Practitioners) who all work together to provide you with the care you need, when you need it.  We recommend signing up for the patient portal called "MyChart".  Sign up information is provided on this After Visit Summary.  MyChart is used to connect with patients for Virtual Visits (Telemedicine).  Patients are able to view lab/test results, encounter notes, upcoming appointments, etc.  Non-urgent messages can be sent to your provider as well.   To learn more about what you can do with MyChart, go to [email or web address].    Your next appointment:   6 MONTHS    The format for your next appointment:   In Person  Provider:   K. Mali Hilty, MD

## 2020-11-19 ENCOUNTER — Encounter: Payer: Medicare Other | Attending: Family Medicine | Admitting: Dietician

## 2020-11-19 DIAGNOSIS — R7303 Prediabetes: Secondary | ICD-10-CM

## 2020-11-19 NOTE — Progress Notes (Signed)
On 11/19/2020 patient completed Core Session 1 of Diabetes Prevention Program course virtually with [facility]. The following learning objectives were met by the patient during this class:   Virtual Visit via Video Note  I connected with Alvino Blood, DOB: 01-28-1946 by a video enabled application and verified that I am speaking with the correct person using two identifiers.  Location: Patient: Virtual  Provider: Office    Learning Objectives:   Be able to explain the purpose and benefits of the National Diabetes Prevention Program.   Be able to describe the events that will take place at every session.   Know the weight loss and physical activity goals established by the [facility] Diabetes Prevention Program.   Know their own individual weight loss and physical activity goals.   Be able to explain the important effect of self-monitoring on behavior change.   Goals:  . Record food and beverage intake in "Food and Activity Tracker" over the next week.  . E-mail completed "Food and Activity Tracker" to Lifestyle Coach next week before session 2. . Circle the foods or beverages you think are highest in fat and calories in your food tracker. . Read the labels on the food you buy, and consider using measuring cups and spoons to help you calculate the amount you eat. We will talk about measuring in more detail in the coming weeks.   Follow-Up Plan:  Attend Core Session 2 next week.   E-mail completed "Food and Activity Tracker" to Lifestyle Coach next week before class.

## 2020-11-19 NOTE — Progress Notes (Deleted)
On 11/19/2020 patient completed Core Session 1 of Diabetes Prevention Program course virtually with [facility]. The following learning objectives were met by the patient during this class:   Virtual Visit via Video Note  I connected with Jose Stone, DOB:1946-01-05 by a video enabled application and verified that I am speaking with the correct person using two identifiers.  Location: Patient: Virtual Provider: Office    Learning Objectives:   Be able to explain the purpose and benefits of the National Diabetes Prevention Program.   Be able to describe the events that will take place at every session.   Know the weight loss and physical activity goals established by the [facility] Diabetes Prevention Program.   Know their own individual weight loss and physical activity goals.   Be able to explain the important effect of self-monitoring on behavior change.   Goals:  . Record food and beverage intake in "Food and Activity Tracker" over the next week.  . E-mail completed "Food and Activity Tracker" to Lifestyle Coach next week before session 2. . Circle the foods or beverages you think are highest in fat and calories in your food tracker. . Read the labels on the food you buy, and consider using measuring cups and spoons to help you calculate the amount you eat. We will talk about measuring in more detail in the coming weeks.   Follow-Up Plan:  Attend Core Session 2 next week.   E-mail completed "Food and Activity Tracker" to Lifestyle Coach next week before class.

## 2020-11-20 ENCOUNTER — Other Ambulatory Visit: Payer: Self-pay | Admitting: Podiatry

## 2020-11-20 DIAGNOSIS — S92402K Displaced unspecified fracture of left great toe, subsequent encounter for fracture with nonunion: Secondary | ICD-10-CM

## 2020-11-21 DIAGNOSIS — R946 Abnormal results of thyroid function studies: Secondary | ICD-10-CM | POA: Diagnosis not present

## 2020-11-21 DIAGNOSIS — R7303 Prediabetes: Secondary | ICD-10-CM | POA: Diagnosis not present

## 2020-11-26 ENCOUNTER — Encounter (HOSPITAL_BASED_OUTPATIENT_CLINIC_OR_DEPARTMENT_OTHER): Payer: Medicare Other | Admitting: Dietician

## 2020-11-26 DIAGNOSIS — R7303 Prediabetes: Secondary | ICD-10-CM

## 2020-11-26 NOTE — Progress Notes (Signed)
On 11/26/2020 patient completed Core Session 2 of Diabetes Prevention Program course virtually with [facility]. The following learning objectives were met by the patient during this class:   I connected with Jose Stone, [birth year] by a video enabled application and verified that I am speaking with the correct person using two identifiers.  Location: Patient: Virtual Provider: Office  Learning Objectives:  Self-monitor their weight during the weeks following Session 2.   Describe the relationship between fat and calories.   Explain the reason for, and basic principles of, self-monitoring fat grams and calories.   Identify their personal fat gram goals.   Use the ?Fat and Calorie Counter to calculate the calories and fat grams of a given selection of foods.   Keep a running total of the fat grams they eat each day.   Calculate fat, calories, and serving sizes from nutrition labels.   Goals:   Weigh yourself at the same time each day, or every few days, and record your weight in your Food and Activity Tracker.  Write down everything you eat and drink in your Food and Activity Tracker.  Measure portions as much as you can, and start reading labels.   Use the ?Fat and Calorie Counter to figure out the amount of fat and calories in what you ate, and write the amount down in your Food and Activity Tracker.  Keep a running fat gram total throughout the day. Come as close to your fat gram goal as you can.   Follow-Up Plan:  Attend Core Session 3 next week.   Email completed  "Food and Activity Tracker" to Lifestyle Coach next week.

## 2020-11-30 DIAGNOSIS — I2510 Atherosclerotic heart disease of native coronary artery without angina pectoris: Secondary | ICD-10-CM | POA: Diagnosis not present

## 2020-11-30 DIAGNOSIS — K219 Gastro-esophageal reflux disease without esophagitis: Secondary | ICD-10-CM | POA: Diagnosis not present

## 2020-11-30 DIAGNOSIS — I4891 Unspecified atrial fibrillation: Secondary | ICD-10-CM | POA: Diagnosis not present

## 2020-11-30 DIAGNOSIS — I10 Essential (primary) hypertension: Secondary | ICD-10-CM | POA: Diagnosis not present

## 2020-11-30 DIAGNOSIS — E785 Hyperlipidemia, unspecified: Secondary | ICD-10-CM | POA: Diagnosis not present

## 2020-12-03 ENCOUNTER — Encounter (HOSPITAL_BASED_OUTPATIENT_CLINIC_OR_DEPARTMENT_OTHER): Payer: Medicare Other | Admitting: Dietician

## 2020-12-03 DIAGNOSIS — N5201 Erectile dysfunction due to arterial insufficiency: Secondary | ICD-10-CM | POA: Diagnosis not present

## 2020-12-03 DIAGNOSIS — R7303 Prediabetes: Secondary | ICD-10-CM | POA: Diagnosis not present

## 2020-12-03 DIAGNOSIS — E291 Testicular hypofunction: Secondary | ICD-10-CM | POA: Diagnosis not present

## 2020-12-03 DIAGNOSIS — N400 Enlarged prostate without lower urinary tract symptoms: Secondary | ICD-10-CM | POA: Diagnosis not present

## 2020-12-03 DIAGNOSIS — R948 Abnormal results of function studies of other organs and systems: Secondary | ICD-10-CM | POA: Diagnosis not present

## 2020-12-03 NOTE — Progress Notes (Signed)
On 12/03/2020 patient completed Core Session 3 of Diabetes Prevention Program course virtually with [facility]. The following learning objectives were met by the patient during this class:   I connected with Jose Stone, 01/19/1946 by a video enabled application and verified that I am speaking with the correct person using two identifiers.  Location: Patient: Virtual Provider: Office  Learning Objectives:  Weigh and measure foods.  Estimate the fat and calorie content of common foods.  Describe three ways to eat less fat and fewer calories.  Create a plan to eat less fat for the following week.   Goals:   Track weight when weighing outside of class.   Track food and beverages eaten each day in Food and Activity Tracker and include fat grams and calories for each.   Try to stay within fat gram goal.   Complete plan for eating less high fat foods and answer related homework questions.    Follow-Up Plan:  Attend Core Session 4 next week.   Bring completed "Food and Activity Tracker" next week to be reviewed by Lifestyle Coach.

## 2020-12-10 ENCOUNTER — Encounter: Payer: Medicare Other | Attending: Family Medicine | Admitting: Dietician

## 2020-12-10 DIAGNOSIS — R7303 Prediabetes: Secondary | ICD-10-CM

## 2020-12-10 NOTE — Progress Notes (Signed)
On 12/10/2020 patient completed Core Session 4 of Diabetes Prevention Program course virtually with [facility]. The following learning objectives were met by the patient during this class:   I connected with Jose Stone, Aug 12, 1946 by a video enabled application and verified that I am speaking with the correct person using two identifiers.  Location: Patient: Virtual Provider: Office  Learning Objectives:  Describe the MyPlate food guide and its recommendations, including how to reduce fat and calories in our diet.  Compare and contrast MyPlate guidelines with participants' eating habits.  List ways to replace high-fat and high-calorie foods with low-fat and low-calorie foods.  Explain the importance of eating plenty of whole grains, vegetables, and fruits, while staying within fat gram goals.  Explain the importance of eating foods from all groups of MyPlate and of eating a variety of foods from within each group.  Explain why a balanced diet is beneficial to health.  Goals:   Record weight taken outside of class.   Track foods and beverages eaten each day in the "Food and Activity Tracker," including calories and fat grams for each item.   Practice comparing what you eat with the recommendations of MyPlate using the "Rate Your Plate" handout.   Complete the "Rate Your Plate" handout form on at least 3 days.   Answer homework questions.   Follow-Up Plan:  Attend Core Session 5 next week.   Email completed "Food and Activity Tracker" next week to be reviewed by Lifestyle Coach.

## 2020-12-17 ENCOUNTER — Encounter (HOSPITAL_BASED_OUTPATIENT_CLINIC_OR_DEPARTMENT_OTHER): Payer: Medicare Other | Admitting: Dietician

## 2020-12-17 DIAGNOSIS — R7303 Prediabetes: Secondary | ICD-10-CM

## 2020-12-17 NOTE — Progress Notes (Signed)
On 12/18/2019 patient completed Core Session 5 of Diabetes Prevention Program course virtually with [facility]. The following learning objectives were met by the patient during this class:   I connected with Jose Stone, 02/05/1946 by a video enabled application and verified that I am speaking with the correct person using two identifiers.  Location: Patient: Virtual Provider: Office  Learning Objectives:  Establish a physical activity goal.  Explain the importance of the physical activity goal.  Describe their current level of physical activity.  Name ways that they are already physically active.  Develop personal plans for physical activity for the next week.   Goals:   Record weight taken outside of class.   Track foods and beverages eaten each day in the "Food and Activity Tracker," including calories and fat grams for each item.   Make an Activity Plan including date, specific type of activity, and length of time you plan to be active that includes at last 60 minutes of activity for the week.   Track activity type, minutes you were active, and distance you reached each day in the "Food and Activity Tracker."   Follow-Up Plan: . Attend Core Session 6 next week.  . E-mail completed "Food and Activity Tracker" to Lifestyle Coach next week before class  

## 2020-12-24 ENCOUNTER — Encounter (HOSPITAL_BASED_OUTPATIENT_CLINIC_OR_DEPARTMENT_OTHER): Payer: Medicare Other | Admitting: Dietician

## 2020-12-24 DIAGNOSIS — R7303 Prediabetes: Secondary | ICD-10-CM

## 2020-12-24 NOTE — Progress Notes (Signed)
On 12/24/2020 patient completed Core Session 6 of Diabetes Prevention Program course virtually with [facility]. The following learning objectives were met by the patient during this class:   I connected with Jose Stone 01-Jan-1946 by a video enabled application and verified that I am speaking with the correct person using two identifiers.  Location: Patient: Virtual Provider: Office  Learning Objectives:  Graph their daily physical activity.   Describe two ways of finding the time to be active.   Define "lifestyle activity."   Describe how to prevent injury.   Develop an activity plan for the coming week.   Goals:   Record weight taken outside of class.   Track foods and beverages eaten each day in the "Food and Activity Tracker," including calories and fat grams for each item.    Track activity type, minutes you were active, and distance you reached each day in the "Food and Activity Tracker."   Set aside one 20 to 30-minute block of time every day or find two or more periods of 10 to15 minutes each for physical activity.   Warm up, cool down, and stretch.  Make a Physical Activities Plan for the Week.   Follow-Up Plan:  Attend Core Session 7 next week.   E-mail completed "Food and Activity Tracker" to Lifestyle Coach next week before class

## 2020-12-31 ENCOUNTER — Encounter (HOSPITAL_BASED_OUTPATIENT_CLINIC_OR_DEPARTMENT_OTHER): Payer: Medicare Other | Admitting: Dietician

## 2020-12-31 DIAGNOSIS — R7303 Prediabetes: Secondary | ICD-10-CM | POA: Diagnosis not present

## 2020-12-31 NOTE — Progress Notes (Signed)
On 12/31/2020 patient completed Core Session 7 of Diabetes Prevention Program course virtually with [facility]. The following learning objectives were met by the patient during this class:   I connected with Hilaria Ota 10/15/1946 by a video enabled application and verified that I am speaking with the correct person using two identifiers.  Location: Patient: Virtual Provider: Office  Learning Objectives:  Define calorie balance.  Explain how healthy eating and being active are related in terms of calorie balance.   Describe the relationship between calorie balance and weight loss.   Describe his or her progress as it relates to calorie balance.   Develop an activity plan for the coming week.   Goals:   Record weight taken outside of class.   Track foods and beverages eaten each day in the "Food and Activity Tracker," including calories and fat grams for each item.    Track activity type, minutes you were active, and distance you reached each day in the "Food and Activity Tracker."   Set aside one 20 to 30-minute block of time every day or find two or more periods of 10 to15 minutes each for physical activity.   Make a Physical Activities Plan for the Week.   Make active lifestyle choices all through the day   Stay at or go slightly over activity goal.   Follow-Up Plan:  Attend Core Session 8 next week.   E-mail completed "Food and Activity Tracker" to Lifestyle Coach next week before class

## 2021-01-03 DIAGNOSIS — D1801 Hemangioma of skin and subcutaneous tissue: Secondary | ICD-10-CM | POA: Diagnosis not present

## 2021-01-03 DIAGNOSIS — D2262 Melanocytic nevi of left upper limb, including shoulder: Secondary | ICD-10-CM | POA: Diagnosis not present

## 2021-01-03 DIAGNOSIS — Z85820 Personal history of malignant melanoma of skin: Secondary | ICD-10-CM | POA: Diagnosis not present

## 2021-01-03 DIAGNOSIS — D225 Melanocytic nevi of trunk: Secondary | ICD-10-CM | POA: Diagnosis not present

## 2021-01-03 DIAGNOSIS — D2271 Melanocytic nevi of right lower limb, including hip: Secondary | ICD-10-CM | POA: Diagnosis not present

## 2021-01-03 DIAGNOSIS — L570 Actinic keratosis: Secondary | ICD-10-CM | POA: Diagnosis not present

## 2021-01-03 DIAGNOSIS — L821 Other seborrheic keratosis: Secondary | ICD-10-CM | POA: Diagnosis not present

## 2021-01-03 DIAGNOSIS — D2261 Melanocytic nevi of right upper limb, including shoulder: Secondary | ICD-10-CM | POA: Diagnosis not present

## 2021-01-03 DIAGNOSIS — D2272 Melanocytic nevi of left lower limb, including hip: Secondary | ICD-10-CM | POA: Diagnosis not present

## 2021-01-04 DIAGNOSIS — I4891 Unspecified atrial fibrillation: Secondary | ICD-10-CM | POA: Diagnosis not present

## 2021-01-04 DIAGNOSIS — E785 Hyperlipidemia, unspecified: Secondary | ICD-10-CM | POA: Diagnosis not present

## 2021-01-04 DIAGNOSIS — I2510 Atherosclerotic heart disease of native coronary artery without angina pectoris: Secondary | ICD-10-CM | POA: Diagnosis not present

## 2021-01-04 DIAGNOSIS — I10 Essential (primary) hypertension: Secondary | ICD-10-CM | POA: Diagnosis not present

## 2021-01-04 DIAGNOSIS — K219 Gastro-esophageal reflux disease without esophagitis: Secondary | ICD-10-CM | POA: Diagnosis not present

## 2021-01-07 ENCOUNTER — Encounter: Payer: Medicare Other | Attending: Family Medicine | Admitting: Dietician

## 2021-01-07 DIAGNOSIS — R7303 Prediabetes: Secondary | ICD-10-CM | POA: Insufficient documentation

## 2021-01-07 NOTE — Progress Notes (Signed)
On 01/07/2021 patient completed Core Session 8 of Diabetes Prevention Program course virtually with [facility]. The following learning objectives were met by the patient during this class:   I connected with Hilaria Ota, 09/14/1946 by a video enabled application and verified that I am speaking with the correct person using two identifiers.  Location: Patient: Virtual Provider: Office  Learning Objectives:  Recognize positive and negative food and activity cues.   Change negative food and activity cues to positive cues.   Add positive cues for activity and eliminate cues for inactivity.   Develop a plan for removing one problem food cue for the coming week.   Goals:   Record weight taken outside of class.   Track foods and beverages eaten each day in the "Food and Activity Tracker," including calories and fat grams for each item.    Track activity type, minutes you were active, and distance you reached each day in the "Food and Activity Tracker."   Set aside one 20 to 30-minute block of time every day or find two or more periods of 10 to15 minutes each for physical activity.   Remove one problem food cue.   Add one positive cue for being more active.  Follow-Up Plan: . Attend Core Session 9 next week.  . Email completed "Food and Activity Tracker" next week to be reviewed by Lifestyle Coach.

## 2021-01-14 ENCOUNTER — Other Ambulatory Visit: Payer: Self-pay

## 2021-01-14 ENCOUNTER — Encounter (HOSPITAL_BASED_OUTPATIENT_CLINIC_OR_DEPARTMENT_OTHER): Payer: Medicare Other | Admitting: Dietician

## 2021-01-14 ENCOUNTER — Ambulatory Visit (INDEPENDENT_AMBULATORY_CARE_PROVIDER_SITE_OTHER): Payer: Medicare Other | Admitting: Podiatry

## 2021-01-14 DIAGNOSIS — R7303 Prediabetes: Secondary | ICD-10-CM | POA: Diagnosis not present

## 2021-01-14 DIAGNOSIS — L84 Corns and callosities: Secondary | ICD-10-CM

## 2021-01-14 DIAGNOSIS — D689 Coagulation defect, unspecified: Secondary | ICD-10-CM | POA: Diagnosis not present

## 2021-01-14 NOTE — Progress Notes (Signed)
On 01/14/2021 patient completed Core Session 9 of Diabetes Prevention Program course virtually with [facility]. The following learning objectives were met by the patient during this class:   Virtual Visit via Video Note  I connected with Jose Stone, 14-Dec-1945 by a video enabled application and verified that I am speaking with the correct person using two identifiers.  Location: Patient: Virtual Provider: Office  Learning Objectives:  List and describe five steps to problem solving.   Apply the five problem solving steps to resolve a problem he or she has with eating less fat and fewer calories or being more active.   Goals:   Record weight taken outside of class.   Track foods and beverages eaten each day in the "Food and Activity Tracker," including calories and fat grams for each item.    Track activity type, minutes you were active, and distance you reached each day in the "Food and Activity Tracker."   Set aside one 20 to 30-minute block of time every day or find two or more periods of 10 to15 minutes each for physical activity.   Use problem solving action plan created during session to problem solve.   Follow-Up Plan:  Attend Core Session 10 next week.   Email completed "Food and Activity Tracker" next week to be reviewed by Lifestyle Coach.  Email menus from favorite restaurants to next session for future discussion.

## 2021-01-20 NOTE — Progress Notes (Signed)
Subjective: 75 year old male presents the office today for follow-up evaluation of a pre-ulcerative callus on the left foot.  His wife has been trying to shave it down at home as well.  Denies any skin breakdown or ulcerations.  No drainage or pus or any swelling or redness.  Has no other concerns today. Denies any systemic concerns including fevers, chills, nausea, vomiting.  Objective: AAO x3, NAD DP/PT pulses palpable bilaterally, CRT less than 3 seconds Hyperkeratotic tissue with evidence of dried blood.  Upon debridement there is no underlying ulceration drainage or any signs of infection.  There is no surrounding erythema, ascending cellulitis but there is no fluctuation crepitation but there is no malodor.  No signs of infection. No pain with calf compression, swelling, warmth, erythema  Assessment: Preulcerative callus left hallux IPJ  Plan: -All treatment options discussed with the patient including all alternatives, risks, complications.  -Debrided the hyperkeratotic tissue with any complications or bleeding.  Recommend moisturizer daily.  Continue offloading.  Monitor for skin breakdown or any recurrence of infection. -Continue inserts that he has well prevent pronation.  Trula Slade DPM

## 2021-01-21 ENCOUNTER — Other Ambulatory Visit: Payer: Self-pay

## 2021-01-21 ENCOUNTER — Ambulatory Visit (INDEPENDENT_AMBULATORY_CARE_PROVIDER_SITE_OTHER): Payer: Medicare Other | Admitting: Internal Medicine

## 2021-01-21 ENCOUNTER — Encounter: Payer: Self-pay | Admitting: Internal Medicine

## 2021-01-21 VITALS — BP 122/72 | HR 57 | Ht 73.0 in | Wt 238.2 lb

## 2021-01-21 DIAGNOSIS — Z9989 Dependence on other enabling machines and devices: Secondary | ICD-10-CM

## 2021-01-21 DIAGNOSIS — G4733 Obstructive sleep apnea (adult) (pediatric): Secondary | ICD-10-CM

## 2021-01-21 DIAGNOSIS — I2510 Atherosclerotic heart disease of native coronary artery without angina pectoris: Secondary | ICD-10-CM | POA: Diagnosis not present

## 2021-01-21 DIAGNOSIS — R55 Syncope and collapse: Secondary | ICD-10-CM

## 2021-01-21 DIAGNOSIS — I480 Paroxysmal atrial fibrillation: Secondary | ICD-10-CM | POA: Diagnosis not present

## 2021-01-21 NOTE — Progress Notes (Signed)
PCP: Kathyrn Lass, MD   Primary EP: Dr Rayann Heman  Jose Stone is a 75 y.o. male who presents today for routine electrophysiology followup.  Since last being seen in our clinic, the patient reports doing very well.  Today, he denies symptoms of palpitations, chest pain, shortness of breath,  lower extremity edema, dizziness, presyncope, or syncope.  The patient is otherwise without complaint today.   Past Medical History:  Diagnosis Date  . Aortic root enlargement ([place])   . CAD (coronary artery disease)   . Complication of anesthesia    Hypotensive with general anesth.  . GERD (gastroesophageal reflux disease)   . Hyperlipidemia   . Hypertension   . Melanoma ([place])   . Persistent atrial fibrillation ([place])    Afib,Aflutter  . Typical atrial flutter ([place])   . 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 Ventricular septal defect    Past Surgical History:  Procedure Laterality Date  . ATRIAL FIBRILLATION ABLATION N/A 07/20/2020   Procedure: ATRIAL FIBRILLATION ABLATION;  Surgeon: [name], MD;  Location: [facility];  Service: Cardiovascular;  Laterality: N/A;  . CARDIAC CATHETERIZATION    . CARDIOVERSION N/A 02/29/2020   Procedure: CARDIOVERSION;  Surgeon: [name], MD;  Location: [facility] ENDOSCOPY;  Service: Cardiovascular;  Laterality: N/A;  . CARDIOVERSION N/A 06/28/2020   Procedure: CARDIOVERSION;  Surgeon: Thayer Headings, MD;  Location: [facility] ENDOSCOPY;  Service: Cardiovascular;  Laterality: N/A;  . DISTAL INTERPHALANGEAL JOINT FUSION Left 01/19/2019   Procedure: HALLUX INTERPHANGEAL JOINT FUSION LEFT FOOT;  Surgeon: Trula Slade, DPM;  Location: [employer] [facility];  Service: Podiatry;  Laterality: Left;  LEFT BLOCK  . hipospedious    . LEFT HEART CATHETERIZATION WITH CORONARY ANGIOGRAM N/A 08/14/2014   Procedure: LEFT HEART CATHETERIZATION WITH CORONARY ANGIOGRAM;  Surgeon: Birdie Riddle, MD;  Location: [facility];  Service: Cardiovascular;   Laterality: N/A;  . MASS EXCISION Left 01/19/2019   Procedure: EXCISION BENIGN LESION 2.OCM LEFT FOOT;  Surgeon: Trula Slade, DPM;  Location: [place];  Service: Podiatry;  Laterality: Left;  . SHOULDER ARTHROSCOPY WITH SUBACROMIAL DECOMPRESSION Left 05/03/2020   Procedure: SHOULDER ARTHROSCOPY DEBRIDEMENT PARTIAL ROTATOR CUFF REPAIR, SUBACROMIAL DECOMPRESSION, BICEPS TENOTOMY;  Surgeon: Tania Ade, MD;  Location: [facility];  Service: Orthopedics;  Laterality: Left;  . SHOULDER SURGERY    . TONSILLECTOMY  1955    ROS- all systems are reviewed and negatives except as per HPI above  Current Outpatient Medications  Medication Sig Dispense Refill  . acetaminophen (TYLENOL) 500 MG tablet Take 1,000 mg by mouth every 6 (six) hours as needed for mild pain or headache.    . alendronate (FOSAMAX) 70 MG tablet Take 70 mg by mouth once a week. Take with a full glass of water on an empty stomach.    Marland Kitchen allopurinol (ZYLOPRIM) 100 MG tablet Take 200 mg by mouth daily.   0  . atorvastatin (LIPITOR) 80 MG tablet TAKE 1 TABLET BY MOUTH DAILY AT 6 PM 90 tablet 3  . COLCRYS 0.6 MG tablet Take 0.6 mg by mouth daily as needed (FOR GOUT).     Marland Kitchen ketotifen (ZADITOR) 0.025 % ophthalmic solution Place 1 drop into both eyes as needed for allergies.    Sunday Corn Mineral Oil-Mineral Oil (RETAINE MGD) 0.5-0.5 % EMUL Place 1 drop into both eyes in the morning, at noon, and at bedtime.    Marland Kitchen loratadine (CLARITIN) 10 MG tablet Take 10  mg by mouth daily as needed for allergies.    . Multiple Vitamin (MULTIVITAMIN WITH MINERALS) TABS tablet Take 1 tablet by mouth daily. Centrum Silver    . nitroGLYCERIN (NITROSTAT) 0.4 MG SL tablet Place 1 tablet (0.4 mg total) under the tongue every 5 (five) minutes as needed for chest pain. Max 3 doses. 25 tablet 5  . Omega-3 Fat Ac-Cholecalciferol (DRY EYE OMEGA BENEFITS/VIT D-3 PO) Take 2 capsules by mouth in the morning and at bedtime.    Marland Kitchen omeprazole (PRILOSEC) 40 MG capsule Take 40 mg  by mouth daily before breakfast. 30-60 minutes prior to breakfast.  3  . sildenafil (REVATIO) 20 MG tablet Take 1 tablet by mouth as needed for erectile dysfunction.    . valsartan (DIOVAN) 160 MG tablet TAKE 1 TABLET BY MOUTH EVERY DAY 90 tablet 2  . XARELTO 20 MG TABS tablet TAKE 1 TABLET BY MOUTH EVERY DAY WITH SUPPER 90 tablet 3   No current facility-administered medications for this visit.    Physical Exam: Vitals:   01/21/21 1153  BP: 122/72  Pulse: (!) 57  SpO2: 96%  Weight: 238 lb 3.2 oz (108 kg)  Height: 6\' 1"  (1.854 m)    GEN- The patient is well appearing, alert and oriented x 3 today.   Head- normocephalic, atraumatic Eyes-  Sclera clear, conjunctiva pink Ears- hearing intact Oropharynx- clear Lungs- Clear to ausculation bilaterally, normal work of breathing Heart- Regular rate and rhythm, no murmurs, rubs or gallops, PMI not laterally displaced GI- soft, NT, ND, + BS Extremities- no clubbing, cyanosis, or edema  Wt Readings from Last 3 Encounters:  01/21/21 238 lb 3.2 oz (108 kg)  11/14/20 245 lb (111.1 kg)  10/22/20 245 lb 12.8 oz (111.5 kg)    EKG tracing ordered today is personally reviewed and shows sinus with PVCs  Assessment and Plan:  Persistent atrial fibrillation/ atrial flutter He is doing very well post ablation off AAD therapy chads2vasc score is 3.  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  He has stopped paxil without recurrence.  3. OSA Uses CPAP  4. PVCs Asymptomatic  5. CAD No ischemic symptoms  6. Overweight  Body mass index is 31.43 kg/m. Lifestyle modification advised  Risks, benefits and potential toxicities for medications prescribed and/or refilled reviewed with patient today.   [name] MD, [facility] 01/21/2021 12:02 PM

## 2021-01-21 NOTE — Patient Instructions (Signed)
Medication Instructions:  Your physician recommends that you continue on your current medications as directed. Please refer to the Current Medication list given to you today.  Labwork: None ordered.  Testing/Procedures: None ordered.  Follow-Up: Your physician wants you to follow-up in: 6 months with   Renee Ursuy, PA-C    You will receive a reminder letter in the mail two months in advance. If you don't receive a letter, please call our office to schedule the follow-up appointment.  Any Other Special Instructions Will Be Listed Below (If Applicable).  If you need a refill on your cardiac medications before your next appointment, please call your pharmacy.         

## 2021-01-24 DIAGNOSIS — N400 Enlarged prostate without lower urinary tract symptoms: Secondary | ICD-10-CM | POA: Diagnosis not present

## 2021-01-24 DIAGNOSIS — N3000 Acute cystitis without hematuria: Secondary | ICD-10-CM | POA: Diagnosis not present

## 2021-01-28 ENCOUNTER — Other Ambulatory Visit: Payer: Self-pay

## 2021-01-28 ENCOUNTER — Encounter (HOSPITAL_BASED_OUTPATIENT_CLINIC_OR_DEPARTMENT_OTHER): Payer: Medicare Other | Admitting: Dietician

## 2021-01-28 DIAGNOSIS — R7303 Prediabetes: Secondary | ICD-10-CM | POA: Diagnosis not present

## 2021-01-28 NOTE — Progress Notes (Signed)
On 01/28/2021 patient completed Core Session 10 of Diabetes Prevention Program course virtually with [facility]. The following learning objectives were met by the patient during this class:   Virtual Visit via Video Note  I connected with Jose Stone, 03/12/1946 by a video enabled application and verified that I am speaking with the correct person using two identifiers.  Location: Patient: Virtual Provider: Office  Learning Objectives:  List and describe the four keys for healthy eating out.   Give examples of how to apply these keys at the type of restaurants that the participants go to regularly.   Make an appropriate meal selection from a restaurant menu.   Demonstrate how to ask for a substitute item using assertive language and a polite tone of voice.    Goals:   Record weight taken outside of class.   Track foods and beverages eaten each day in the "Food and Activity Tracker," including calories and fat grams for each item.    Track activity type, minutes you were active, and distance you reached each day in the "Food and Activity Tracker."   Set aside one 20 to 30-minute block of time every day or find two or more periods of 10 to15 minutes each for physical activity.   Utilize positive action plan and complete questions on "To Do List."   Follow-Up Plan:  Attend Core Session 11.   Email completed "Food and Activity Tracker" to be reviewed by Lifestyle Coach.

## 2021-02-04 ENCOUNTER — Encounter: Payer: Medicare Other | Attending: Family Medicine | Admitting: Dietician

## 2021-02-04 ENCOUNTER — Other Ambulatory Visit: Payer: Self-pay

## 2021-02-04 DIAGNOSIS — R7303 Prediabetes: Secondary | ICD-10-CM

## 2021-02-04 NOTE — Progress Notes (Signed)
On 02/04/2021 patient completed Session 11 of Diabetes Prevention Program course virtually with [facility]. By the end of this session patients are able to complete the following objectives:   Virtual Visit via Video Note  I connected with Jose Stone, 28-Dec-1945 by a video enabled application and verified that I am speaking with the correct person using two identifiers.  Location: Patient: Virtual  Provider: Office  Learning Objectives:  Give examples of negative thoughts that could prevent them from meeting their goals of losing weight and being more physically active.   Describe how to stop negative thoughts and talk back to them with positive thoughts.   Practice 1) stopping negative thoughts and 2) talking back to negative thoughts with positive ones.    Goals:   Record weight taken outside of class.   Track foods and beverages eaten each day in the "Food and Activity Tracker," including calories and fat grams for each item.    Track activity type, minutes you were active, and distance you reached each day in the "Food and Activity Tracker."   If you have any negative thoughts-write them in your Food and Activity Trackers, along with how you talked back to them. Practice stopping negative thoughts and talking back to them with positive thoughts.   Follow-Up Plan:  Attend Core Session 12 next week.   Email completed "Food and Activity Tracker" before next week to be reviewed by Lifestyle Coach.

## 2021-02-07 ENCOUNTER — Telehealth: Payer: Self-pay | Admitting: Internal Medicine

## 2021-02-07 NOTE — Telephone Encounter (Signed)
RN spoke to patient regarding episode of Afib. Patient states while he was waiting on the phone he converted back to normal rhythm and is no longer feeling the "flutter." Patients HR is now 74. No chest pain or shortness of breath noted. Patient states this is the first episode he has felt since his ablation. Patient denies any missed doses of medication. Patient would like to notify Dr.Allred just for precaution. RN advised I would send a message to Dr. Rayann Heman and his nurse. Patient verbalized understanding. RN encouraged patient to contact the office with any further symptoms or concerns.    Message sent to Dr. Rayann Heman and Otila Kluver, RN.

## 2021-02-07 NOTE — Telephone Encounter (Signed)
Patient c/o Palpitations:  High priority if patient c/o lightheadedness, shortness of breath, or chest pain  1) How long have you had palpitations/irregular HR/ Afib? Are you having the symptoms now?  Patient states he has been in afib for about 1 hour. He is currently still in afib, states he feels flutters.  2) Are you currently experiencing lightheadedness, SOB or CP?  No   3) Do you have a history of afib (atrial fibrillation) or irregular heart rhythm?  Yes   4) Have you checked your BP or HR? (document readings if available):  02/07/21: 112/70 135-140  5) Are you experiencing any other symptoms?  No   STAT if HR is under 50 or over 120 (normal HR is 60-100 beats per minute)  1) What is your heart rate?  Patient's HR is ranging 135/140 this morning.  2) Do you have a log of your heart rate readings (document readings)?  No   3) Do you have any other symptoms?  Patient is currently in afib.

## 2021-02-11 ENCOUNTER — Encounter (HOSPITAL_BASED_OUTPATIENT_CLINIC_OR_DEPARTMENT_OTHER): Payer: Medicare Other | Admitting: Dietician

## 2021-02-11 ENCOUNTER — Other Ambulatory Visit: Payer: Self-pay

## 2021-02-11 DIAGNOSIS — R7303 Prediabetes: Secondary | ICD-10-CM | POA: Diagnosis not present

## 2021-02-11 NOTE — Progress Notes (Signed)
Patient was seen on 02/11/2021 for the Core Session 12 of Diabetes Prevention Program course at [facility]. By the end of this session patients are able to complete the following objectives:   Virtual Visit via Video Note  I connected with Jose Stone, 13-Jun-1946 on 02/11/21 at 10:30 AM EDT by a video enabled application and verified that I am speaking with the correct person using two identifiers.  Location: Patient: Virtual Provider: Office  Learning Objectives:  Describe their current progress toward defined goals.  Describe common causes for slipping from healthy eating or being  active.  Explain what to do to get back on their feet after a slip.  Goals:   Record weight taken outside of class.   Track foods and beverages eaten each day in the "Food and Activity Tracker," including calories and fat grams for each item.    Track activity type, minutes active, and distance reached each day in the "Food and Activity Tracker."   Try out the two action plans created during session- "Slips from Healthy Eating: Action Plan" and "Slips from Being Active: Action Plan"  Answer questions on the handout.   Follow-Up Plan:  Attend Core Session 13 next week.   Bring completed "Food and Activity Tracker" next week to be reviewed by Lifestyle Coach.

## 2021-02-18 ENCOUNTER — Other Ambulatory Visit: Payer: Self-pay

## 2021-02-18 ENCOUNTER — Encounter (HOSPITAL_BASED_OUTPATIENT_CLINIC_OR_DEPARTMENT_OTHER): Payer: Medicare Other | Admitting: Dietician

## 2021-02-18 DIAGNOSIS — R7303 Prediabetes: Secondary | ICD-10-CM | POA: Diagnosis not present

## 2021-02-18 NOTE — Progress Notes (Signed)
On 02/18/2021 patient completed the Core Session 13 of Diabetes Prevention Program course virtually with [facility]. By the end of this session patients are able to complete the following objectives:   Virtual Visit via Video Note  I connected with@ on 02/18/21 at 10:30 AM EDT by a video enabled application and verified that I am speaking with the correct person using two identifiers.  Location: Patient: Virtual Provider: Office  Learning Objectives:  Describe ways to add interest and variety to their activity plans.  Define ?aerobic fitness.  Explain the four F.I.T.T. principles (frequency, intensity, time, and type of activity) and how they relate to aerobic fitness.   Goals:   Record weight taken outside of class.   Track foods and beverages eaten each day in the "Food and Activity Tracker," including calories and fat grams for each item.    Track activity type, minutes you were active, and distance you reached each day in the "Food and Activity Tracker."   Do your best to reach activity goal for the week.  Use one of the F.I.T.T. principles to jump start workouts.  Document activity level on the "To Do Next Week" handout.  Follow-Up Plan:  Attend Core Session 14 next week.   Email completed "Food and Activity Tracker" before next week to be reviewed by Lifestyle Coach.

## 2021-02-25 ENCOUNTER — Encounter (HOSPITAL_BASED_OUTPATIENT_CLINIC_OR_DEPARTMENT_OTHER): Payer: Medicare Other | Admitting: Registered"

## 2021-02-25 ENCOUNTER — Encounter: Payer: Self-pay | Admitting: Registered"

## 2021-02-25 DIAGNOSIS — Z713 Dietary counseling and surveillance: Secondary | ICD-10-CM | POA: Diagnosis not present

## 2021-02-25 DIAGNOSIS — R7303 Prediabetes: Secondary | ICD-10-CM

## 2021-02-25 NOTE — Progress Notes (Signed)
On 02/25/21 patient completed Core Session 14 of Diabetes Prevention Program course virtually with [facility]. By the end of this session patients are able to complete the following objectives:   Virtual Visit via Video Note  I connected with Jose Stone on 02/25/21 at 10:30 AM EDT by a video enabled application and verified that I am speaking with the correct person using two identifiers.  Location: Patient: Home.  Provider: Office.   Learning Objectives:  Give examples of problem social cues and helpful social cues.   Explain how to remove problem social cues and add helpful ones.   Describe ways of coping with vacations and social events such as parties, holidays, and visits from relatives and friends.   Create an action plan to change a problem social cue and add a helpful one.   Goals:   Record weight taken outside of class.   Track foods and beverages eaten each day in the "Food and Activity Tracker," including calories and fat grams for each item.    Track activity type, minutes you were active, and distance you reached each day in the "Food and Activity Tracker."   Do your best to reach activity goal for the week.  Use action plan created during session to change a problem social cue and add a helpful social cue.   Answer questions regarding success of changing social cues on "To Do Next Week" handout.   Follow-Up Plan:  Attend Core Session 15 next week.   Email completed "Food and Activity Tracker" before next week to be reviewed by Lifestyle Coach.

## 2021-03-04 ENCOUNTER — Encounter: Payer: Medicare Other | Attending: Family Medicine | Admitting: Registered"

## 2021-03-04 ENCOUNTER — Encounter: Payer: Self-pay | Admitting: Registered"

## 2021-03-04 DIAGNOSIS — Z713 Dietary counseling and surveillance: Secondary | ICD-10-CM | POA: Diagnosis not present

## 2021-03-04 DIAGNOSIS — R7303 Prediabetes: Secondary | ICD-10-CM

## 2021-03-04 NOTE — Progress Notes (Signed)
On 03/04/21 patient completed Core Session 15 of Diabetes Prevention Program course virtually with [facility]. By the end of this session patients are able to complete the following objectives:   Virtual Visit via Video Note  I connected with [name] on 03/04/21 at 10:30 AM EDT by a video enabled application and verified that I am speaking with the correct person using two identifiers.  Location: Patient: Home.  Provider: Office.   Learning Objectives:  Explain how to prevent stress or cope with unavoidable stress.   Describe how this program can be a source of stress.   Explain how to manage stressful situations.   Create and follow an action plan for either preventing or coping with a stressful situation.   Goals:   Record weight taken outside of class.   Track foods and beverages eaten each day in the "Food and Activity Tracker," including calories and fat grams for each item.    Track activity type, minutes you were active, and distance you reached each day in the "Food and Activity Tracker."   Do your best to reach activity goal for the week.  Follow your action plan to reduce stress.   Answer questions on handout regarding success of action plan.   Follow-Up Plan:  Attend Core Session 16 next week.   Email completed "Food and Activity Tracker" before next week to be reviewed by Lifestyle Coach.

## 2021-03-11 ENCOUNTER — Encounter (HOSPITAL_BASED_OUTPATIENT_CLINIC_OR_DEPARTMENT_OTHER): Payer: Medicare Other | Admitting: Dietician

## 2021-03-11 ENCOUNTER — Other Ambulatory Visit: Payer: Self-pay

## 2021-03-11 DIAGNOSIS — R7303 Prediabetes: Secondary | ICD-10-CM

## 2021-03-11 NOTE — Progress Notes (Signed)
On 03/11/2021 patient completed Core Session 16 of Diabetes Prevention Program course virtually with [facility]. By the end of this session patients are able to complete the following objectives:   Virtual Visit via Video Note  I connected with Hilaria Ota, 1946/01/30 on 03/11/21 at 10:30 AM EDT by a video enabled application and verified that I am speaking with the correct person using two identifiers.  Location: Patient: Virtual Provider: Office  Learning Objectives:  Measure their progress toward weight and physical activity goals since Session 1.   Develop a plan for improving progress, if their goals have not yet been attained.   Describe ways to stay motivated long-term.   Goals:   Record weight taken outside of class.   Track foods and beverages eaten each day in the "Food and Activity Tracker," including calories and fat grams for each item.    Track activity type, minutes you were active, and distance you reached each day in the "Food and Activity Tracker."   Utilize action plan to help stay motivated and complete questions on "To Do List."   Follow-Up Plan:  Attend session 17 in two weeks.   Email completed "Food and Activity Tracker" before next session to be reviewed by Lifestyle Coach.

## 2021-03-16 ENCOUNTER — Emergency Department (HOSPITAL_COMMUNITY): Payer: Medicare Other

## 2021-03-16 ENCOUNTER — Other Ambulatory Visit: Payer: Self-pay

## 2021-03-16 ENCOUNTER — Inpatient Hospital Stay (HOSPITAL_COMMUNITY)
Admission: EM | Admit: 2021-03-16 | Discharge: 2021-03-19 | DRG: 247 | Disposition: A | Discharge status: Home / Self Care | Payer: Medicare Other | Attending: Internal Medicine | Admitting: Internal Medicine

## 2021-03-16 ENCOUNTER — Encounter (HOSPITAL_COMMUNITY): Payer: Self-pay

## 2021-03-16 DIAGNOSIS — G4733 Obstructive sleep apnea (adult) (pediatric): Secondary | ICD-10-CM | POA: Diagnosis not present

## 2021-03-16 DIAGNOSIS — I4510 Unspecified right bundle-branch block: Secondary | ICD-10-CM | POA: Diagnosis present

## 2021-03-16 DIAGNOSIS — Z7901 Long term (current) use of anticoagulants: Secondary | ICD-10-CM

## 2021-03-16 DIAGNOSIS — Z20822 Contact with and (suspected) exposure to covid-19: Secondary | ICD-10-CM | POA: Diagnosis present

## 2021-03-16 DIAGNOSIS — Z9989 Dependence on other enabling machines and devices: Secondary | ICD-10-CM | POA: Diagnosis not present

## 2021-03-16 DIAGNOSIS — Q210 Ventricular septal defect: Secondary | ICD-10-CM | POA: Diagnosis not present

## 2021-03-16 DIAGNOSIS — K219 Gastro-esophageal reflux disease without esophagitis: Secondary | ICD-10-CM | POA: Diagnosis present

## 2021-03-16 DIAGNOSIS — R001 Bradycardia, unspecified: Secondary | ICD-10-CM | POA: Diagnosis not present

## 2021-03-16 DIAGNOSIS — E785 Hyperlipidemia, unspecified: Secondary | ICD-10-CM | POA: Diagnosis not present

## 2021-03-16 DIAGNOSIS — Z79899 Other long term (current) drug therapy: Secondary | ICD-10-CM

## 2021-03-16 DIAGNOSIS — Z85820 Personal history of malignant melanoma of skin: Secondary | ICD-10-CM

## 2021-03-16 DIAGNOSIS — I2583 Coronary atherosclerosis due to lipid rich plaque: Secondary | ICD-10-CM | POA: Diagnosis not present

## 2021-03-16 DIAGNOSIS — I10 Essential (primary) hypertension: Secondary | ICD-10-CM | POA: Diagnosis present

## 2021-03-16 DIAGNOSIS — I4819 Other persistent atrial fibrillation: Secondary | ICD-10-CM | POA: Diagnosis not present

## 2021-03-16 DIAGNOSIS — I200 Unstable angina: Secondary | ICD-10-CM | POA: Diagnosis present

## 2021-03-16 DIAGNOSIS — I480 Paroxysmal atrial fibrillation: Secondary | ICD-10-CM | POA: Diagnosis present

## 2021-03-16 DIAGNOSIS — I483 Typical atrial flutter: Secondary | ICD-10-CM | POA: Diagnosis present

## 2021-03-16 DIAGNOSIS — I25110 Atherosclerotic heart disease of native coronary artery with unstable angina pectoris: Principal | ICD-10-CM | POA: Diagnosis present

## 2021-03-16 DIAGNOSIS — I2510 Atherosclerotic heart disease of native coronary artery without angina pectoris: Secondary | ICD-10-CM | POA: Diagnosis not present

## 2021-03-16 DIAGNOSIS — I351 Nonrheumatic aortic (valve) insufficiency: Secondary | ICD-10-CM | POA: Diagnosis present

## 2021-03-16 DIAGNOSIS — Z955 Presence of coronary angioplasty implant and graft: Secondary | ICD-10-CM

## 2021-03-16 DIAGNOSIS — R079 Chest pain, unspecified: Secondary | ICD-10-CM | POA: Diagnosis present

## 2021-03-16 DIAGNOSIS — I712 Thoracic aortic aneurysm, without rupture: Secondary | ICD-10-CM | POA: Diagnosis present

## 2021-03-16 DIAGNOSIS — Z8249 Family history of ischemic heart disease and other diseases of the circulatory system: Secondary | ICD-10-CM

## 2021-03-16 DIAGNOSIS — R000 Tachycardia, unspecified: Secondary | ICD-10-CM | POA: Diagnosis not present

## 2021-03-16 DIAGNOSIS — Z981 Arthrodesis status: Secondary | ICD-10-CM

## 2021-03-16 DIAGNOSIS — Z888 Allergy status to other drugs, medicaments and biological substances status: Secondary | ICD-10-CM

## 2021-03-16 DIAGNOSIS — Z885 Allergy status to narcotic agent status: Secondary | ICD-10-CM

## 2021-03-16 DIAGNOSIS — I77810 Thoracic aortic ectasia: Secondary | ICD-10-CM | POA: Diagnosis present

## 2021-03-16 DIAGNOSIS — R58 Hemorrhage, not elsewhere classified: Secondary | ICD-10-CM | POA: Diagnosis not present

## 2021-03-16 DIAGNOSIS — R0789 Other chest pain: Secondary | ICD-10-CM | POA: Diagnosis not present

## 2021-03-16 DIAGNOSIS — I252 Old myocardial infarction: Secondary | ICD-10-CM

## 2021-03-16 LAB — CBC WITH DIFFERENTIAL/PLATELET
Abs Immature Granulocytes: 0.04 10*3/uL (ref 0.00–0.07)
Basophils Absolute: 0 10*3/uL (ref 0.0–0.1)
Basophils Relative: 0 %
Eosinophils Absolute: 0.2 10*3/uL (ref 0.0–0.5)
Eosinophils Relative: 2 %
HCT: 37.2 % — ABNORMAL LOW (ref 39.0–52.0)
Hemoglobin: 11.7 g/dL — ABNORMAL LOW (ref 13.0–17.0)
Immature Granulocytes: 0 %
Lymphocytes Relative: 22 %
Lymphs Abs: 1.9 10*3/uL (ref 0.7–4.0)
MCH: 23.8 pg — ABNORMAL LOW (ref 26.0–34.0)
MCHC: 31.5 g/dL (ref 30.0–36.0)
MCV: 75.8 fL — ABNORMAL LOW (ref 80.0–100.0)
Monocytes Absolute: 0.7 10*3/uL (ref 0.1–1.0)
Monocytes Relative: 8 %
Neutro Abs: 6 10*3/uL (ref 1.7–7.7)
Neutrophils Relative %: 68 %
Platelets: 233 10*3/uL (ref 150–400)
RBC: 4.91 MIL/uL (ref 4.22–5.81)
RDW: 17.2 % — ABNORMAL HIGH (ref 11.5–15.5)
WBC: 8.9 10*3/uL (ref 4.0–10.5)
nRBC: 0 % (ref 0.0–0.2)

## 2021-03-16 LAB — TROPONIN I (HIGH SENSITIVITY)
Troponin I (High Sensitivity): 7 ng/L (ref <18)
Troponin I (High Sensitivity): 7 ng/L (ref <18)

## 2021-03-16 LAB — COMPREHENSIVE METABOLIC PANEL
ALT: 25 U/L (ref 0–44)
AST: 28 U/L (ref 15–41)
Albumin: 3.5 g/dL (ref 3.5–5.0)
Alkaline Phosphatase: 68 U/L (ref 38–126)
Anion gap: 7 (ref 5–15)
BUN: 21 mg/dL (ref 8–23)
CO2: 26 mmol/L (ref 22–32)
Calcium: 8.8 mg/dL — ABNORMAL LOW (ref 8.9–10.3)
Chloride: 106 mmol/L (ref 98–111)
Creatinine, Ser: 1.09 mg/dL (ref 0.61–1.24)
GFR, Estimated: >60 mL/min (ref >60)
Glucose, Bld: 92 mg/dL (ref 70–99)
Potassium: 4.1 mmol/L (ref 3.5–5.1)
Sodium: 139 mmol/L (ref 135–145)
Total Bilirubin: 0.9 mg/dL (ref 0.3–1.2)
Total Protein: 6 g/dL — ABNORMAL LOW (ref 6.5–8.1)

## 2021-03-16 MED ORDER — RIVAROXABAN 20 MG PO TABS
20.0000 mg | ORAL_TABLET | Freq: Every day | ORAL | Status: DC
  Administered 2021-03-16: 20 mg via ORAL
  Filled 2021-03-16 (×3): qty 1

## 2021-03-16 MED ORDER — SODIUM CHLORIDE 0.9 % IV SOLN: Freq: Once | INTRAVENOUS | Status: AC

## 2021-03-16 MED ORDER — SODIUM CHLORIDE 0.9 % IV SOLN
75.0000 mL/h | INTRAVENOUS | Status: DC
  Administered 2021-03-16: 75 mL/h via INTRAVENOUS

## 2021-03-16 MED ORDER — ATORVASTATIN CALCIUM 80 MG PO TABS
80.0000 mg | ORAL_TABLET | Freq: Every day | ORAL | Status: DC
  Administered 2021-03-17 – 2021-03-19 (×3): 80 mg via ORAL
  Filled 2021-03-16 (×3): qty 1

## 2021-03-16 MED ORDER — NITROGLYCERIN 0.4 MG SL SUBL
0.4000 mg | SUBLINGUAL_TABLET | SUBLINGUAL | Status: DC | PRN
  Administered 2021-03-16 – 2021-03-17 (×3): 0.4 mg via SUBLINGUAL
  Filled 2021-03-16 (×2): qty 1

## 2021-03-16 MED ORDER — ACETAMINOPHEN 650 MG RE SUPP: 650.0000 mg | Freq: Four times a day (QID) | RECTAL | Status: DC | PRN

## 2021-03-16 MED ORDER — ACETAMINOPHEN 325 MG PO TABS: 650.0000 mg | ORAL_TABLET | Freq: Four times a day (QID) | ORAL | Status: DC | PRN

## 2021-03-16 MED ORDER — MORPHINE SULFATE (PF) 2 MG/ML IV SOLN
2.0000 mg | Freq: Once | INTRAVENOUS | Status: AC
  Administered 2021-03-16: 2 mg via INTRAVENOUS
  Filled 2021-03-16: qty 1

## 2021-03-16 NOTE — ED Notes (Signed)
ED provider aware pt complaining of CP. @ beside talking w/ pt.

## 2021-03-16 NOTE — ED Triage Notes (Addendum)
PT from home BIB EMS for c/o substernal CP that started at 1530 while seated; similar to burning/heart burn. Hx of ablation and MI with stent placement. Pt took nitro SL x2 with no relief; possibly expired. EMS administered nitrox1 and 324 ASA. Denies CP at this time.

## 2021-03-16 NOTE — H&P (Signed)
Jose Stone [id number] DOB: 1946/07/29 DOA: 03/16/2021     PCP: Kathyrn Lass, MD   Outpatient Specialists:   CARDS:   Dr.HIlty, Terrill Mohr   Urology Dr. Earmon Phoenix  Patient arrived to ER on 03/16/21 at 1748 Referred by Attending Horton, Alvin Critchley, DO   Patient coming from: home Lives  With family    Chief Complaint:   Chief Complaint  Patient presents with  . Chest Pain    HPI: Jose Stone is a 75 y.o. male with medical history significant of CAD sp stentaortic valve regurg, atrial fibrillation, VSD, right bundle branch block    Presented with  CP started started at 3:30 PM when he was sitting non exertional  felt like a heartburn, his SO tried to check BP and it was low he got diaphoretic Reports chest pain feels like hurting that is intermittent lasting few seconds at a time and coming in waves. Patient took nitroglycerin x2 but did not seem to help but he thought maybe it is expired EMS was called and gave him another nitroglycerin and 324 of aspirin At first improved  No blood in stool no black stools  Infectious risk factors:  Reports chest pain,     Has   been vaccinated against COVID and boosted   Initial COVID TEST   in house  PCR testing  Pending  Lab Results  Component Value Date   [place] 07/17/2020   [place] NEGATIVE 06/26/2020   [place] NEGATIVE 04/30/2020   [place] NEGATIVE 02/27/2020    Regarding pertinent Chronic problems:    Hyperlipidemia -  on statins Lipitor Lipid Panel     Component Value Date/Time   CHOL 139 09/20/2019 0933   CHOL 131 01/22/2015 0812   TRIG 64 09/20/2019 0933   TRIG 93 01/22/2015 0812   HDL 52 09/20/2019 0933   HDL 49 01/22/2015 0812   CHOLHDL 2.7 09/20/2019 0933   LDLCALC 74 09/20/2019 0933   LDLCALC 63 01/22/2015 0812   LABVLDL 13 09/20/2019 0933     HTN on Diovan     CAD 2015  - On  statin,                 -  followed by cardiology                - last cardiac cath   2015   obesity-   BMI Readings from Last 1 Encounters:  01/21/21 31.43 kg/m   OSA -on  CPAP     A. Fib -  - CHA2DS2 vas score  3  Sp Ablation     current  on anticoagulation with xarelto           -  Rate control:  Currently controlled  Off diltiazem   Chronic anemia - baseline hg Hemoglobin & Hematocrit  Recent Labs    06/01/20 1558 07/13/20 1021 03/16/21 1833  HGB 14.2 13.4 11.7*   While in ER: Was having intermittent chest pain best helped with morphine Nitroglycerin did not seem to affect the chest pain at all    ED Triage Vitals  Enc Vitals Group     BP 03/16/21 1753 121/61     Pulse Rate 03/16/21 1753 (!) 56     Resp 03/16/21 1753 14     Temp 03/16/21 1753 98.6 F (37 C)     Temp Source 03/16/21 1753 Oral     SpO2 03/16/21 1753 96 %     Weight --  Height --      Head Circumference --      Peak Flow --      Pain Score 03/16/21 1754 0     Pain Loc --      Pain Edu? --      Excl. in [place]? --   TMAX(24)@     _________________________________________ Significant initial  Findings: Abnormal Labs Reviewed  CBC WITH DIFFERENTIAL/PLATELET - Abnormal; Notable for the following components:      Result Value   Hemoglobin 11.7 (*)    HCT 37.2 (*)    MCV 75.8 (*)    MCH 23.8 (*)    RDW 17.2 (*)    All other components within normal limits  COMPREHENSIVE METABOLIC PANEL - Abnormal; Notable for the following components:   Calcium 8.8 (*)    Total Protein 6.0 (*)    All other components within normal limits   ____________________________________________ Ordered   CXR -  NON acute   __________ Troponin  7 - 7 ECG: Ordered Personally reviewed by me showing: HR : 49 Rhythm:  A.fib.     no evidence of ischemic changes QTC 373 The recent clinical data is shown below. Vitals:   03/16/21 2045 03/16/21 2100 03/16/21 2130 03/16/21 2145  BP: 121/68 121/60 119/61 124/64  Pulse: 62 (!) 58 (!) 49 (!) 54  Resp: (!) 21 14 14 13   Temp:      TempSrc:      SpO2: 94%  96% 98% 97%   WBC     Component Value Date/Time   WBC 8.9 03/16/2021 1833   LYMPHSABS 1.9 03/16/2021 1833   LYMPHSABS 3.0 07/13/2020 1021   MONOABS 0.7 03/16/2021 1833   EOSABS 0.2 03/16/2021 1833   EOSABS 0.2 07/13/2020 1021   BASOSABS 0.0 03/16/2021 1833   BASOSABS 0.1 07/13/2020 1021    Lactic Acid, Venous    Component Value Date/Time   LATICACIDVEN 1.52 11/18/2015 2019        UA  ordered      Results for orders placed or performed during the hospital encounter of 07/17/20  SARS CORONAVIRUS 2 (TAT 6-24 HRS) Nasopharyngeal Nasopharyngeal Swab     Status: None   Collection Time: 07/17/20  1:21 PM   Specimen: Nasopharyngeal Swab  Result Value Ref Range Status   SARS Coronavirus 2 NEGATIVE NEGATIVE Final    Comment: (NOTE) SARS-CoV-2 target nucleic acids are NOT DETECTED.  The SARS-CoV-2 RNA is generally detectable in upper and lower respiratory specimens during the acute phase of infection. Negative results do not preclude SARS-CoV-2 infection, do not rule out co-infections with other pathogens, and should not be used as the sole basis for treatment or other patient management decisions. Negative results must be combined with clinical observations, patient history, and epidemiological information. The expected result is Negative.  Fact Sheet for Patients: SugarRoll.be  Fact Sheet for Healthcare Providers: https://www.woods-mathews.com/  This test is not yet approved or cleared by the [place] FDA and  has been authorized for detection and/or diagnosis of SARS-CoV-2 by FDA under an Emergency Use Authorization (EUA). This EUA will remain  in effect (meaning this test can be used) for the duration of the COVID-19 declaration under Se ction 564(b)(1) of the Act, 21 U.S.C. section 360bbb-3(b)(1), unless the authorization is terminated or revoked sooner.  Performed at [facility], [place] [address], [place],  [place] [address]     _______________________________________________________ ER Provider Called:  Cardiology  Dr. Humphrey Rolls  _____________________ Hospitalist was  called for admission for Chest pain  The following Work up has been ordered so far:  Orders Placed This Encounter  Procedures  . SARS CORONAVIRUS 2 (TAT 6-24 HRS) Nasopharyngeal Nasopharyngeal Swab  . DG Chest 2 View  . CBC with Differential  . Comprehensive metabolic panel  . Consult to hospitalist  . EKG 12-Lead  . Repeat EKG     Following Medications were ordered in ER: Medications  nitroGLYCERIN (NITROSTAT) SL tablet 0.4 mg (0.4 mg Sublingual Given 03/16/21 2039)  0.9 %  sodium chloride infusion ( Intravenous New Bag/Given 03/16/21 1852)  morphine 2 MG/ML injection 2 mg (2 mg Intravenous Given 03/16/21 2133)        Consult Orders  (From admission, onward)         Start     Ordered   03/16/21 2144  Consult to hospitalist  Paged by Kennyth Lose  Once       Provider:  (Not yet assigned)  Question Answer Comment  Place call to: [facility]   Reason for Consult Admit      03/16/21 2143            OTHER Significant initial  Findings:  labs showing:    Recent Labs  Lab 03/16/21 1833  NA 139  K 4.1  CO2 26  GLUCOSE 92  BUN 21  CREATININE 1.09  CALCIUM 8.8*    Cr  * stable,  Up from baseline see below Lab Results  Component Value Date   CREATININE 1.09 03/16/2021   CREATININE 1.10 07/13/2020   CREATININE 1.22 06/01/2020    Recent Labs  Lab 03/16/21 1833  AST 28  ALT 25  ALKPHOS 68  BILITOT 0.9  PROT 6.0*  ALBUMIN 3.5   Lab Results  Component Value Date   CALCIUM 8.8 (L) 03/16/2021          Plt: Lab Results  Component Value Date   PLT 233 03/16/2021       COVID-19 Labs  No results for input(s): DDIMER, FERRITIN, LDH, CRP in the last 72 hours.  Lab Results  Component Value Date   SARSCOV2NAA NEGATIVE 07/17/2020   SARSCOV2NAA NEGATIVE 06/26/2020   SARSCOV2NAA NEGATIVE  04/30/2020   [place] NEGATIVE 02/27/2020        Recent Labs  Lab 03/16/21 1833  WBC 8.9  NEUTROABS 6.0  HGB 11.7*  HCT 37.2*  MCV 75.8*  PLT 233    HG/HCT * stable,  Down *Up from baseline see below    Component Value Date/Time   HGB 11.7 (L) 03/16/2021 1833   HGB 13.4 07/13/2020 1021   HCT 37.2 (L) 03/16/2021 1833   HCT 41.1 07/13/2020 1021   MCV 75.8 (L) 03/16/2021 1833   MCV 75 (L) 07/13/2020 1021      No results for input(s): LIPASE, AMYLASE in the last 168 hours. No results for input(s): AMMONIA in the last 168 hours.   Cardiac Panel (last 3 results) No results for input(s): CKTOTAL, CKMB, TROPONINI, RELINDX in the last 72 hours.   BNP (last 3 results) No results for input(s): BNP in the last 8760 hours.    DM  labs:  HbA1C: No results for input(s): HGBA1C in the last 8760 hours.     CBG (last 3)  No results for input(s): GLUCAP in the last 72 hours.        Cultures: No results found for: SDES, [place], CULT, REPTSTATUS   Radiological Exams on Admission: DG Chest 2 View  Result Date:  03/16/2021 CLINICAL DATA:  Chest pain EXAM: CHEST - 2 VIEW COMPARISON:  July 13, 2018 FINDINGS: Small granuloma left lower lobe region. No edema or airspace opacity. Heart is upper normal in size with pulmonary vascularity normal. No adenopathy. No pneumothorax. No bone lesions. Focal calcification in right carotid artery. Colonic interposition between right hemidiaphragm and liver noted incidentally. IMPRESSION: No edema or airspace opacity. Small granuloma left lower lobe. Stable cardiac silhouette. There is right carotid artery calcification. Electronically Signed   By: Lowella Grip III M.D.   On: 03/16/2021 19:35   _______________________________________________________________________________________________________ Latest  Blood pressure 124/64, pulse (!) 54, temperature 98.6 F (37 C), temperature source Oral, resp. rate 13, SpO2 97 %.   Review of  Systems:    Pertinent positives include:   fatigue,   chest pain,  Constitutional:  No weight loss, night sweats, Fevers, chills, weight loss  HEENT:  No headaches, Difficulty swallowing,Tooth/dental problems,Sore throat,  No sneezing, itching, ear ache, nasal congestion, post nasal drip,  Cardio-vascular:  NoOrthopnea, PND, anasarca, dizziness, palpitations.no Bilateral lower extremity swelling  GI:  No heartburn, indigestion, abdominal pain, nausea, vomiting, diarrhea, change in bowel habits, loss of appetite, melena, blood in stool, hematemesis Resp:  no shortness of breath at rest. No dyspnea on exertion, No excess mucus, no productive cough, No non-productive cough, No coughing up of blood.No change in color of mucus.No wheezing. Skin:  no rash or lesions. No jaundice GU:  no dysuria, change in color of urine, no urgency or frequency. No straining to urinate.  No flank pain.  Musculoskeletal:  No joint pain or no joint swelling. No decreased range of motion. No back pain.  Psych:  No change in mood or affect. No depression or anxiety. No memory loss.  Neuro: no localizing neurological complaints, no tingling, no weakness, no double vision, no gait abnormality, no slurred speech, no confusion  All systems reviewed and apart from [place] all are negative _______________________________________________________________________________________________ Past Medical History:   Past Medical History:  Diagnosis Date  . Aortic root enlargement ([place])   . CAD (coronary artery disease)   . Complication of anesthesia    Hypotensive with general anesth.  . GERD (gastroesophageal reflux disease)   . Hyperlipidemia   . Hypertension   . Melanoma ([place])   . Persistent atrial fibrillation ([place])    Afib,Aflutter  . Typical atrial flutter ([place])   . 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 Ventricular septal defect       Past Surgical History:  Procedure  Laterality Date  . ATRIAL FIBRILLATION ABLATION N/A 07/20/2020   Procedure: ATRIAL FIBRILLATION ABLATION;  Surgeon: [name], MD;  Location: [facility];  Service: Cardiovascular;  Laterality: N/A;  . CARDIAC CATHETERIZATION    . CARDIOVERSION N/A 02/29/2020   Procedure: CARDIOVERSION;  Surgeon: [name], MD;  Location: [facility] ENDOSCOPY;  Service: Cardiovascular;  Laterality: N/A;  . CARDIOVERSION N/A 06/28/2020   Procedure: CARDIOVERSION;  Surgeon: Thayer Headings, MD;  Location: [facility] ENDOSCOPY;  Service: Cardiovascular;  Laterality: N/A;  . DISTAL INTERPHALANGEAL JOINT FUSION Left 01/19/2019   Procedure: HALLUX INTERPHANGEAL JOINT FUSION LEFT FOOT;  Surgeon: Trula Slade, DPM;  Location: [facility];  Service: Podiatry;  Laterality: Left;  LEFT BLOCK  . hipospedious    . LEFT HEART CATHETERIZATION WITH CORONARY ANGIOGRAM N/A 08/14/2014   Procedure: LEFT HEART CATHETERIZATION WITH CORONARY ANGIOGRAM;  Surgeon: Birdie Riddle, MD;  Location: [facility] CATH LAB;  Service:  Cardiovascular;  Laterality: N/A;  . MASS EXCISION Left 01/19/2019   Procedure: EXCISION BENIGN LESION 2.OCM LEFT FOOT;  Surgeon: Trula Slade, DPM;  Location: [facility];  Service: Podiatry;  Laterality: Left;  . SHOULDER ARTHROSCOPY WITH SUBACROMIAL DECOMPRESSION Left 05/03/2020   Procedure: SHOULDER ARTHROSCOPY DEBRIDEMENT PARTIAL ROTATOR CUFF REPAIR, SUBACROMIAL DECOMPRESSION, BICEPS TENOTOMY;  Surgeon: Tania Ade, MD;  Location: [facility] ORS;  Service: Orthopedics;  Laterality: Left;  . SHOULDER SURGERY    . TONSILLECTOMY  1955    Social History:  Ambulatory   Independently     reports that he has never smoked. He has never used smokeless tobacco. He reports that he does not drink alcohol and does not use drugs.   Family History:   Family History  Problem Relation Age of Onset  . Pancreatic cancer Mother   . Aortic aneurysm Maternal Grandmother   . Heart attack Maternal Grandfather   . Diabetes Paternal  Grandfather   . CAD Paternal Grandfather    ______________________________________________________________________________________________ Allergies: Allergies  Allergen Reactions  . Codeine Nausea Only    Can tolerate with benadryl  . Mupirocin Itching  . Hydrocodone Rash    Can tolerate with benadryl  . Neosporin [Neomycin-Bacitracin Zn-Polymyx] Rash  . Oxycodone Rash    Can tolerate with benadryl     Prior to Admission medications   Medication Sig Start Date End Date Taking? Authorizing Provider  acetaminophen (TYLENOL) 500 MG tablet Take 1,000 mg by mouth every 6 (six) hours as needed for mild pain or headache.    [provider]  alendronate (FOSAMAX) 70 MG tablet Take 70 mg by mouth once a week. Take with a full glass of water on an empty stomach.    [provider]  allopurinol (ZYLOPRIM) 100 MG tablet Take 200 mg by mouth daily.  03/26/18   [provider]  atorvastatin (LIPITOR) 80 MG tablet TAKE 1 TABLET BY MOUTH DAILY AT 6 PM 07/19/20   Hilty, Nadean Corwin, MD  COLCRYS 0.6 MG tablet Take 0.6 mg by mouth daily as needed (FOR GOUT).  05/26/14   [provider]  ketotifen (ZADITOR) 0.025 % ophthalmic solution Place 1 drop into both eyes as needed for allergies.    [provider]  Light Mineral Oil-Mineral Oil (RETAINE MGD) 0.5-0.5 % EMUL Place 1 drop into both eyes in the morning, at noon, and at bedtime.    [provider]  loratadine (CLARITIN) 10 MG tablet Take 10 mg by mouth daily as needed for allergies.    [provider]  Multiple Vitamin (MULTIVITAMIN WITH MINERALS) TABS tablet Take 1 tablet by mouth daily. Centrum Silver    [provider]  nitroGLYCERIN (NITROSTAT) 0.4 MG SL tablet Place 1 tablet (0.4 mg total) under the tongue every 5 (five) minutes as needed for chest pain. Max 3 doses. 02/24/20   Hilty, Nadean Corwin, MD  Omega-3 Fat Ac-Cholecalciferol (DRY EYE OMEGA BENEFITS/VIT D-3 PO) Take 2 capsules  by mouth in the morning and at bedtime.    [provider]  omeprazole (PRILOSEC) 40 MG capsule Take 40 mg by mouth daily before breakfast. 30-60 minutes prior to breakfast. 06/22/18   [provider]  sildenafil (REVATIO) 20 MG tablet Take 1 tablet by mouth as needed for erectile dysfunction.    [provider]  valsartan (DIOVAN) 160 MG tablet TAKE 1 TABLET BY MOUTH EVERY DAY 11/07/20   Hilty, Nadean Corwin, MD  XARELTO 20 MG TABS tablet TAKE 1 TABLET BY  MOUTH EVERY DAY WITH SUPPER 03/01/20   [name], MD    ___________________________________________________________________________________________________ Physical Exam: Vitals with BMI 03/16/2021 03/16/2021 03/16/2021  Height - - -  Weight - - -  BMI - - -  Systolic [phone]  Diastolic 64 61 60  Pulse 54 49 58     1. General:  in No  Acute distress    Chronically ill -appearing 2. Psychological: Alert and  Oriented 3. Head/ENT:     Dry Mucous Membranes                          Head Non traumatic, neck supple                            Poor Dentition 4. SKIN:   decreased Skin turgor,  Skin clean Dry and intact no rash 5. Heart: Regular rate and rhythm  +  Murmur, no Rub or gallop 6. Lungs:  no wheezes or crackles   7. Abdomen: Soft non-tender, Non distended bowel sounds present 8. Lower extremities: no clubbing, cyanosis, no  edema 9. Neurologically Grossly intact, moving all 4 extremities equally    10. MSK: Normal range of motion    Chart has been reviewed  ______________________________________________________________________________________________  Assessment/Plan   75 y.o. male with medical history significant of CAD sp stentaortic valve regurg, atrial fibrillation, VSD, right bundle branch block    Admitted for Unstable angina  Present on Admission: . Chest pain obtain echogram cycle cardiac enzymes cardiology consulted  . Symptomatic bradycardia -admit to stepdown appreciate  cardiology input no evidence of hypotension right now although did have an evident episode of hypotension earlier today.  Chest pain seems to be intermittent but well controlled with morphine question if chest pain is bradycardia induced on not  . PAF (paroxysmal atrial fibrillation) ([place]) patient is anticoagulation Xarelto will continue  . Hyperlipidemia -continue statin  . GERD (gastroesophageal reflux disease) chronic stable will make sure patient on PPI  . Essential hypertension allow permissive hypertension   . Coronary artery disease due to lipid rich plaque -continue home medications statin  . Unstable angina (HCC) recurrent chest pain possible unstable angina nonischemic EKG and serial troponins.  Obtain echogram in a.m.  Could be related to bradycardia.  Appreciate cardiology consult patient's not on beta-blocker or any other medications that could contribute to this.  We will check TSH defer to cardiology if pacemaker would be indicated    Other plan as per orders.  DVT prophylaxis:  xarelto     Code Status:    Code Status: Prior FULL CODE   as per patient  I had personally discussed CODE STATUS with patient and family     Family Communication:   Family  at  Bedside  plan of care was discussed   with   Wife,   Disposition Plan:        To home once workup is complete and patient is stable   Following barriers for discharge:                                                        Pain controlled with PO medications  Will need consultants to evaluate patient prior to discharge                       Consults called: Cardiology  Admission status:  ED Disposition    ED Disposition Condition [place]: [place] [100100]  Level of Care: Progressive [102]  Admit to Progressive based on following criteria: CARDIOVASCULAR & THORACIC of moderate stability with acute coronary  syndrome symptoms/low risk myocardial infarction/hypertensive urgency/arrhythmias/heart failure potentially compromising stability and stable post cardiovascular intervention patients.  Covid Evaluation: Asymptomatic Screening Protocol (No Symptoms)  Diagnosis: Chest pain [149702]  Admitting Physician: Toy Baker [[id number]]  Attending Physician: Toy Baker [[id number]]        Obs     Level of care  preogressive tele indefinitely please discontinue once patient no longer qualifies COVID-19 Labs    Lab Results  Component Value Date   [place] NEGATIVE 07/17/2020     Precautions: admitted as asymptomatic screening protocol    PPE: Used by the provider:   N95  eye Goggles,  Gloves     Hildy Nicholl 03/17/2021, 1:01 AM    [facility]     after 2 AM please page floor coverage PA If 7AM-7PM, please contact the day team taking care of the patient using [email or web address]   Patient was evaluated in the context of the global COVID-19 pandemic, which necessitated consideration that the patient might be at risk for infection with the SARS-CoV-2 virus that causes COVID-19. Institutional protocols and algorithms that pertain to the evaluation of patients at risk for COVID-19 are in a state of rapid change based on information released by regulatory bodies including the CDC and federal and state organizations. These policies and algorithms were followed during the patient's care.

## 2021-03-16 NOTE — ED Notes (Signed)
Repeat trop obtained along w/ repeat EKG

## 2021-03-16 NOTE — ED Provider Notes (Signed)
[facility] EMERGENCY DEPARTMENT Provider Note   CSN: [id number] Arrival date & time: 03/16/21  1748     History Chief Complaint  Patient presents with  . Chest Pain    KAEO Jose Stone is a 75 y.o. male past medical history of MI status post stent, aortic valve regurg, atrial fibrillation, VSD, right bundle branch block, presenting for evaluation of substernal chest pain that began at 3 PM.  Followed by Dr. Rayann Heman cardiology.  Last visit was about 2 months ago and EKG showed normal sinus rhythm per patient.  States he was sitting in the garage on a stool, doing not exertional work on a car.  He states he began feeling substernal chest pain described as heartburn indigestion feeling.  He states he went inside to drink some water.  He took 2 doses of sublingual nitroglycerin though thinks it may have been out of date or ineffective it did not provide relief.  He states his wife who is a retired cardiology nurse checked his blood pressure and noted to be 90/60.  He began feeling lightheaded and near syncopal.  He was also diaphoretic with described a "cold sweat".  Symptoms resolved upon EMS arrival to the house.  They did give him a dose of sublingual nitro and full dose of aspirin.  Chest pain has resolved.  He is no longer lightheaded.  States his prior MI only symptom was new onset of A. fib with RVR and cold sweat.  He had ablation in September 2021 for his A. fib, to his knowledge has not been in A. fib since.  Takes Xarelto and is compliant.  The history is provided by the patient.       Past Medical History:  Diagnosis Date  . Aortic root enlargement ([place])   . CAD (coronary artery disease)   . Complication of anesthesia    Hypotensive with general anesth.  . GERD (gastroesophageal reflux disease)   . Hyperlipidemia   . Hypertension   . Melanoma ([place])   . Persistent atrial fibrillation ([place])    Afib,Aflutter  . Typical atrial flutter ([place])   . 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 Ventricular septal defect     Patient Active Problem List   Diagnosis Date Noted  . Persistent atrial fibrillation ([place])   . Fibroma 01/31/2019  . Onychomycosis 10/13/2018  . AKI (acute kidney injury) ([place]) 07/13/2018  . Hyperkalemia 07/13/2018  . Atrial fibrillation with RVR ([place]) 07/13/2018  . Low testosterone in male 01/15/2018  . Chest pain 09/03/2017  . Abnormal nuclear stress test 09/03/2017  . Dizziness 07/21/2017  . PAF (paroxysmal atrial fibrillation) ([place]) 07/21/2017  . Aortic valve regurgitation 03/31/2017  . Dilated aortic root ([place]) 03/31/2017  . Symptomatic bradycardia 06/20/2016  . RBBB (right bundle branch block) 06/20/2016  . OSA on CPAP 04/17/2016  . Excessive daytime sleepiness 04/17/2016  . Essential hypertension 11/16/2014  . Hyperlipidemia 11/16/2014  . GERD (gastroesophageal reflux disease) 11/16/2014  . Coronary artery disease due to lipid rich plaque 11/16/2014  . VSD (ventricular septal defect) 11/16/2014  . Coronary artery disease involving native coronary artery of native heart without angina pectoris 11/16/2014    Past Surgical History:  Procedure Laterality Date  . ATRIAL FIBRILLATION ABLATION N/A 07/20/2020   Procedure: ATRIAL FIBRILLATION ABLATION;  Surgeon: [name], MD;  Location: [facility];  Service: Cardiovascular;  Laterality: N/A;  . CARDIAC CATHETERIZATION    . CARDIOVERSION  N/A 02/29/2020   Procedure: CARDIOVERSION;  Surgeon: [name], MD;  Location: [facility] ENDOSCOPY;  Service: Cardiovascular;  Laterality: N/A;  . CARDIOVERSION N/A 06/28/2020   Procedure: CARDIOVERSION;  Surgeon: Thayer Headings, MD;  Location: [facility] ENDOSCOPY;  Service: Cardiovascular;  Laterality: N/A;  . DISTAL INTERPHALANGEAL JOINT FUSION Left 01/19/2019   Procedure: HALLUX INTERPHANGEAL JOINT FUSION LEFT FOOT;  Surgeon: Trula Slade, DPM;  Location: [facility];  Service: Podiatry;  Laterality:  Left;  LEFT BLOCK  . hipospedious    . LEFT HEART CATHETERIZATION WITH CORONARY ANGIOGRAM N/A 08/14/2014   Procedure: LEFT HEART CATHETERIZATION WITH CORONARY ANGIOGRAM;  Surgeon: Birdie Riddle, MD;  Location: [facility] CATH LAB;  Service: Cardiovascular;  Laterality: N/A;  . MASS EXCISION Left 01/19/2019   Procedure: EXCISION BENIGN LESION 2.OCM LEFT FOOT;  Surgeon: Trula Slade, DPM;  Location: [facility];  Service: Podiatry;  Laterality: Left;  . SHOULDER ARTHROSCOPY WITH SUBACROMIAL DECOMPRESSION Left 05/03/2020   Procedure: SHOULDER ARTHROSCOPY DEBRIDEMENT PARTIAL ROTATOR CUFF REPAIR, SUBACROMIAL DECOMPRESSION, BICEPS TENOTOMY;  Surgeon: Tania Ade, MD;  Location: [facility];  Service: Orthopedics;  Laterality: Left;  . SHOULDER SURGERY    . TONSILLECTOMY  1955       Family History  Problem Relation Age of Onset  . Pancreatic cancer Mother   . Aortic aneurysm Maternal Grandmother   . Heart attack Maternal Grandfather   . Diabetes Paternal Grandfather   . CAD Paternal Grandfather     Social History   Tobacco Use  . Smoking status: Never Smoker  . Smokeless tobacco: Never Used  Vaping Use  . Vaping Use: Never used  Substance Use Topics  . Alcohol use: No  . Drug use: No    Home Medications Prior to Admission medications   Medication Sig Start Date End Date Taking? Authorizing Provider  acetaminophen (TYLENOL) 500 MG tablet Take 1,000 mg by mouth every 6 (six) hours as needed for mild pain or headache.    [provider]  alendronate (FOSAMAX) 70 MG tablet Take 70 mg by mouth once a week. Take with a full glass of water on an empty stomach.    [provider]  allopurinol (ZYLOPRIM) 100 MG tablet Take 200 mg by mouth daily.  03/26/18   [provider]  atorvastatin (LIPITOR) 80 MG tablet TAKE 1 TABLET BY MOUTH DAILY AT 6 PM 07/19/20   Hilty, Nadean Corwin, MD  COLCRYS 0.6 MG tablet Take 0.6 mg by mouth daily as needed (FOR GOUT).  05/26/14   [provider]  ketotifen (ZADITOR) 0.025 % ophthalmic solution Place 1 drop into both eyes as needed for allergies.    [provider]  Light Mineral Oil-Mineral Oil (RETAINE MGD) 0.5-0.5 % EMUL Place 1 drop into both eyes in the morning, at noon, and at bedtime.    [provider]  loratadine (CLARITIN) 10 MG tablet Take 10 mg by mouth daily as needed for allergies.    [provider]  Multiple Vitamin (MULTIVITAMIN WITH MINERALS) TABS tablet Take 1 tablet by mouth daily. Centrum Silver    [provider]  nitroGLYCERIN (NITROSTAT) 0.4 MG SL tablet Place 1 tablet (0.4 mg total) under the tongue every 5 (five) minutes as needed for chest pain. Max 3 doses. 02/24/20   Hilty, Nadean Corwin, MD  Omega-3 Fat Ac-Cholecalciferol (DRY EYE OMEGA BENEFITS/VIT D-3 PO) Take 2 capsules by mouth in the morning and at bedtime.    [provider]  omeprazole ([place])  40 MG capsule Take 40 mg by mouth daily before breakfast. 30-60 minutes prior to breakfast. 06/22/18   [provider]  sildenafil (REVATIO) 20 MG tablet Take 1 tablet by mouth as needed for erectile dysfunction.    [provider]  valsartan (DIOVAN) 160 MG tablet TAKE 1 TABLET BY MOUTH EVERY DAY 11/07/20   Hilty, Nadean Corwin, MD  XARELTO 20 MG TABS tablet TAKE 1 TABLET BY MOUTH EVERY DAY WITH SUPPER 03/01/20   Hilty, Nadean Corwin, MD    Allergies    Codeine, Mupirocin, Hydrocodone, Neosporin [neomycin-bacitracin zn-polymyx], and Oxycodone  Review of Systems   Review of Systems  Constitutional: Positive for diaphoresis.  Cardiovascular: Positive for chest pain.  Neurological: Positive for light-headedness.  All other systems reviewed and are negative.   Physical Exam Updated Vital Signs BP 121/61   Pulse (!) 56   Temp 98.6 F (37 C) (Oral)   Resp (!) 141   SpO2 96%   Physical Exam Vitals and nursing note reviewed.  Constitutional:      General: He is not in acute distress.     Appearance: He is well-developed. He is not ill-appearing.  HENT:     Head: Normocephalic and atraumatic.  Eyes:     Conjunctiva/sclera: Conjunctivae normal.  Cardiovascular:     Rate and Rhythm: Normal rate. Rhythm irregular.     Pulses: Normal pulses.     Comments: Left lower sternal border systolic murmur Pulmonary:     Effort: Pulmonary effort is normal. No respiratory distress.     Breath sounds: Normal breath sounds.  Abdominal:     General: Bowel sounds are normal.     Palpations: Abdomen is soft.     Tenderness: There is no abdominal tenderness.  Musculoskeletal:     Right lower leg: No edema.     Left lower leg: No edema.  Skin:    General: Skin is warm.  Neurological:     Mental Status: He is alert.  Psychiatric:        Behavior: Behavior normal.     ED Results / Procedures / Treatments   Labs (all labs ordered are listed, but only abnormal results are displayed) Labs Reviewed  CBC WITH DIFFERENTIAL/PLATELET  COMPREHENSIVE METABOLIC PANEL  TROPONIN I (HIGH SENSITIVITY)    EKG None  Radiology No results found.  Procedures Procedures   Medications Ordered in ED Medications - No data to display  ED Course  I have reviewed the triage vital signs and the nursing notes.  Pertinent labs & imaging results that were available during my care of the patient were reviewed by me and considered in my medical decision making (see chart for details).  Clinical Course as of 03/16/21 2354  Sat Mar 16, 2021  2037 Patient recurring CP. Ordered NTG, repeat EKG [JR]    Clinical Course User Index [JR] Evart Mcdonnell, [place] N, PA-C   MDM Rules/Calculators/A&P                          Patient with history of MI with stent, a fib s/p ablation on Xarelto, presenting for evaluation of chest pain.  Chest pain began while he was sitting on a stool in his garage, no significant exertion.  He went inside to rest because he began feeling lightheaded and diaphoretic.  He was noted  to have low blood pressure.  He treated with oral fluids.  Once this improved he took nitroglycerin without relief.  Chest pain-free since EMS arrival until around 830 this evening during ED evaluation.  Nitroglycerin x3 did not provide adequate relief of chest pain that is no longer described as a burning as it is left-sided pain.  Dose of morphine is ordered.  He is also noted to be in atrial fibrillation, was reportedly in normal sinus rhythm since ablation in September of last year.  His heart rate however is intermittently low as low as the 40s.  Question if this may be attributing to patient's symptoms.  Troponins are flat and negative.  EKG otherwise does not show signs of new ischemia.  Considering patient's history, will admit for trend troponins, monitoring.  Cardiology consulted and will evaluate for recommendations.  Do not believe heparin is indicated at this time, he is anticoagulated on Eliquis, cardiology provide recommendations.  Final Clinical Impression(s) / ED Diagnoses Final diagnoses:  Left-sided chest pain    Rx / DC Orders ED Discharge Orders    None       Serah Nicoletti, [place] N, PA-C 03/17/21 0006    Lorelle Gibbs, DO 03/17/21 1727

## 2021-03-16 NOTE — ED Notes (Signed)
Nitro x3 given. CP still remains @ 1. bp 132/64

## 2021-03-16 NOTE — ED Notes (Signed)
Report called to [facility]. Denied questions or concerns regarding report. Pt taken upstairs on stretcher. NAD

## 2021-03-16 NOTE — ED Notes (Addendum)
CP went from 2 to 1 w/ nitro x1. 2nd nitro given bp 145/70

## 2021-03-16 NOTE — ED Provider Notes (Incomplete)
[facility] EMERGENCY DEPARTMENT Provider Note   CSN: [id number] Arrival date & time: 03/16/21  1748     History Chief Complaint  Patient presents with  . Chest Pain    Jose Stone is a 75 y.o. male past medical history of MI status post stent, aortic valve regurg, atrial fibrillation, VSD, right bundle branch block, presenting for evaluation of substernal chest pain that began at 3 PM.  Followed by Dr. Rayann Heman cardiology.  Last visit was about 2 months ago and EKG showed normal sinus rhythm per patient.  States he was sitting in the garage on a stool, doing not exertional work on a car.  He states he began feeling substernal chest pain described as heartburn indigestion feeling.  He states he went inside to drink some water.  He took 2 doses of sublingual nitroglycerin though thinks it may have been out of date or ineffective it did not provide relief.  He states his wife who is a retired cardiology nurse checked his blood pressure and noted to be 90/60.  He began feeling lightheaded and near syncopal.  He was also diaphoretic with described a "cold sweat".  Symptoms resolved upon EMS arrival to the house.  They did give him a dose of sublingual nitro and full dose of aspirin.  Chest pain has resolved.  He is no longer lightheaded.  States his prior MI only symptom was new onset of A. fib with RVR and cold sweat.  He had ablation in September 2021 for his A. fib, to his knowledge has not been in A. fib since.  Takes Xarelto and is compliant.  The history is provided by the patient.       Past Medical History:  Diagnosis Date  . Aortic root enlargement ([place])   . CAD (coronary artery disease)   . Complication of anesthesia    Hypotensive with general anesth.  . GERD (gastroesophageal reflux disease)   . Hyperlipidemia   . Hypertension   . Melanoma ([place])   . Persistent atrial fibrillation ([place])    Afib,Aflutter  . Typical atrial flutter ([place])   . 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 Ventricular septal defect     Patient Active Problem List   Diagnosis Date Noted  . Persistent atrial fibrillation ([place])   . Fibroma 01/31/2019  . Onychomycosis 10/13/2018  . AKI (acute kidney injury) ([place]) 07/13/2018  . Hyperkalemia 07/13/2018  . Atrial fibrillation with RVR ([place]) 07/13/2018  . Low testosterone in male 01/15/2018  . Chest pain 09/03/2017  . Abnormal nuclear stress test 09/03/2017  . Dizziness 07/21/2017  . PAF (paroxysmal atrial fibrillation) ([place]) 07/21/2017  . Aortic valve regurgitation 03/31/2017  . Dilated aortic root ([place]) 03/31/2017  . Symptomatic bradycardia 06/20/2016  . RBBB (right bundle branch block) 06/20/2016  . OSA on CPAP 04/17/2016  . Excessive daytime sleepiness 04/17/2016  . Essential hypertension 11/16/2014  . Hyperlipidemia 11/16/2014  . GERD (gastroesophageal reflux disease) 11/16/2014  . Coronary artery disease due to lipid rich plaque 11/16/2014  . VSD (ventricular septal defect) 11/16/2014  . Coronary artery disease involving native coronary artery of native heart without angina pectoris 11/16/2014    Past Surgical History:  Procedure Laterality Date  . ATRIAL FIBRILLATION ABLATION N/A 07/20/2020   Procedure: ATRIAL FIBRILLATION ABLATION;  Surgeon: [name], MD;  Location: [facility];  Service: Cardiovascular;  Laterality: N/A;  . CARDIAC CATHETERIZATION    . CARDIOVERSION  N/A 02/29/2020   Procedure: CARDIOVERSION;  Surgeon: [name], MD;  Location: [facility] ENDOSCOPY;  Service: Cardiovascular;  Laterality: N/A;  . CARDIOVERSION N/A 06/28/2020   Procedure: CARDIOVERSION;  Surgeon: Thayer Headings, MD;  Location[facility]OSCOPY;  Service: Cardiovascular;  Laterality: N/A;  . DISTAL INTERPHALANGEAL JOINT FUSION Left 01/19/2019   Procedure: HALLUX INTERPHANGEAL JOINT FUSION LEFT FOOT;  Surgeon: Trula Slade, DPM;  Location: [facility];  Service: Podiatry;  Laterality:  Left;  LEFT BLOCK  . hipospedious    . LEFT HEART CATHETERIZATION WITH CORONARY ANGIOGRAM N/A 08/14/2014   Procedure: LEFT HEART CATHETERIZATION WITH CORONARY ANGIOGRAM;  Surgeon: Birdie Riddle, MD;  Location: [facility] CATH LAB;  Service: Cardiovascular;  Laterality: N/A;  . MASS EXCISION Left 01/19/2019   Procedure: EXCISION BENIGN LESION 2.OCM LEFT FOOT;  Surgeon: Trula Slade, DPM;  Location: [facility];  Service: Podiatry;  Laterality: Left;  . SHOULDER ARTHROSCOPY WITH SUBACROMIAL DECOMPRESSION Left 05/03/2020   Procedure: SHOULDER ARTHROSCOPY DEBRIDEMENT PARTIAL ROTATOR CUFF REPAIR, SUBACROMIAL DECOMPRESSION, BICEPS TENOTOMY;  Surgeon: Tania Ade, MD;  Location: [facility];  Service: Orthopedics;  Laterality: Left;  . SHOULDER SURGERY    . TONSILLECTOMY  1955       Family History  Problem Relation Age of Onset  . Pancreatic cancer Mother   . Aortic aneurysm Maternal Grandmother   . Heart attack Maternal Grandfather   . Diabetes Paternal Grandfather   . CAD Paternal Grandfather     Social History   Tobacco Use  . Smoking status: Never Smoker  . Smokeless tobacco: Never Used  Vaping Use  . Vaping Use: Never used  Substance Use Topics  . Alcohol use: No  . Drug use: No    Home Medications Prior to Admission medications   Medication Sig Start Date End Date Taking? Authorizing Provider  acetaminophen (TYLENOL) 500 MG tablet Take 1,000 mg by mouth every 6 (six) hours as needed for mild pain or headache.    [provider]  alendronate (FOSAMAX) 70 MG tablet Take 70 mg by mouth once a week. Take with a full glass of water on an empty stomach.    [provider]  allopurinol (ZYLOPRIM) 100 MG tablet Take 200 mg by mouth daily.  03/26/18   [provider]  atorvastatin (LIPITOR) 80 MG tablet TAKE 1 TABLET BY MOUTH DAILY AT 6 PM 07/19/20   Hilty, Nadean Corwin, MD  COLCRYS 0.6 MG tablet Take 0.6 mg by mouth daily as needed (FOR GOUT).  05/26/14   [provider]  ketotifen (ZADITOR) 0.025 % ophthalmic solution Place 1 drop into both eyes as needed for allergies.    [provider]  Light Mineral Oil-Mineral Oil (RETAINE MGD) 0.5-0.5 % EMUL Place 1 drop into both eyes in the morning, at noon, and at bedtime.    [provider]  loratadine (CLARITIN) 10 MG tablet Take 10 mg by mouth daily as needed for allergies.    [provider]  Multiple Vitamin (MULTIVITAMIN WITH MINERALS) TABS tablet Take 1 tablet by mouth daily. Centrum Silver    [provider]  nitroGLYCERIN (NITROSTAT) 0.4 MG SL tablet Place 1 tablet (0.4 mg total) under the tongue every 5 (five) minutes as needed for chest pain. Max 3 doses. 02/24/20   Hilty, Nadean Corwin, MD  Omega-3 Fat Ac-Cholecalciferol (DRY EYE OMEGA BENEFITS/VIT D-3 PO) Take 2 capsules by mouth in the morning and at bedtime.    [provider]  omeprazole ([place])  40 MG capsule Take 40 mg by mouth daily before breakfast. 30-60 minutes prior to breakfast. 06/22/18   [provider]  sildenafil (REVATIO) 20 MG tablet Take 1 tablet by mouth as needed for erectile dysfunction.    [provider]  valsartan (DIOVAN) 160 MG tablet TAKE 1 TABLET BY MOUTH EVERY DAY 11/07/20   Hilty, Nadean Corwin, MD  XARELTO 20 MG TABS tablet TAKE 1 TABLET BY MOUTH EVERY DAY WITH SUPPER 03/01/20   Hilty, Nadean Corwin, MD    Allergies    Codeine, Mupirocin, Hydrocodone, Neosporin [neomycin-bacitracin zn-polymyx], and Oxycodone  Review of Systems   Review of Systems  Constitutional: Positive for diaphoresis.  Cardiovascular: Positive for chest pain.  Neurological: Positive for light-headedness.  All other systems reviewed and are negative.   Physical Exam Updated Vital Signs BP 121/61   Pulse (!) 56   Temp 98.6 F (37 C) (Oral)   Resp (!) 141   SpO2 96%   Physical Exam Vitals and nursing note reviewed.  Constitutional:      General: He is not in acute distress.     Appearance: He is well-developed. He is not ill-appearing.  HENT:     Head: Normocephalic and atraumatic.  Eyes:     Conjunctiva/sclera: Conjunctivae normal.  Cardiovascular:     Rate and Rhythm: Normal rate. Rhythm irregular.     Pulses: Normal pulses.     Comments: Left lower sternal border systolic murmur Pulmonary:     Effort: Pulmonary effort is normal. No respiratory distress.     Breath sounds: Normal breath sounds.  Abdominal:     General: Bowel sounds are normal.     Palpations: Abdomen is soft.     Tenderness: There is no abdominal tenderness.  Musculoskeletal:     Right lower leg: No edema.     Left lower leg: No edema.  Skin:    General: Skin is warm.  Neurological:     Mental Status: He is alert.  Psychiatric:        Behavior: Behavior normal.     ED Results / Procedures / Treatments   Labs (all labs ordered are listed, but only abnormal results are displayed) Labs Reviewed  CBC WITH DIFFERENTIAL/PLATELET  COMPREHENSIVE METABOLIC PANEL  TROPONIN I (HIGH SENSITIVITY)    EKG None  Radiology No results found.  Procedures Procedures {Remember to document critical care time when appropriate:1}  Medications Ordered in ED Medications - No data to display  ED Course  I have reviewed the triage vital signs and the nursing notes.  Pertinent labs & imaging results that were available during my care of the patient were reviewed by me and considered in my medical decision making (see chart for details).  Clinical Course as of 03/16/21 2354  Sat Mar 16, 2021  2037 Patient recurring CP. Ordered NTG, repeat EKG [JR]    Clinical Course User Index [JR] Angelyne Terwilliger, [place] N, PA-C   MDM Rules/Calculators/A&P                          Patient with history of MI with stent, a fib s/p ablation on Xarelto, presenting for evaluation of chest pain.  Chest pain began while he was sitting on a stool in his garage, no significant exertion.  He went inside to rest because  he began feeling lightheaded and diaphoretic.  He was noted to have low blood pressure.  He treated with oral fluids.  Once this  improved he took nitroglycerin without relief.  Chest pain-free since EMS arrival until around 830 this evening during ED evaluation.  Nitroglycerin x3 did not provide Final Clinical Impression(s) / ED Diagnoses Final diagnoses:  None    Rx / DC Orders ED Discharge Orders    None

## 2021-03-16 NOTE — ED Notes (Signed)
hospitalist @ bedside talking w/ pt

## 2021-03-17 ENCOUNTER — Observation Stay (HOSPITAL_COMMUNITY): Payer: Medicare Other

## 2021-03-17 DIAGNOSIS — I361 Nonrheumatic tricuspid (valve) insufficiency: Secondary | ICD-10-CM

## 2021-03-17 DIAGNOSIS — I2583 Coronary atherosclerosis due to lipid rich plaque: Secondary | ICD-10-CM | POA: Diagnosis not present

## 2021-03-17 DIAGNOSIS — R079 Chest pain, unspecified: Secondary | ICD-10-CM | POA: Diagnosis not present

## 2021-03-17 DIAGNOSIS — Q210 Ventricular septal defect: Secondary | ICD-10-CM | POA: Diagnosis not present

## 2021-03-17 DIAGNOSIS — R001 Bradycardia, unspecified: Secondary | ICD-10-CM | POA: Diagnosis not present

## 2021-03-17 DIAGNOSIS — Z85820 Personal history of malignant melanoma of skin: Secondary | ICD-10-CM | POA: Diagnosis not present

## 2021-03-17 DIAGNOSIS — I4819 Other persistent atrial fibrillation: Secondary | ICD-10-CM | POA: Diagnosis not present

## 2021-03-17 DIAGNOSIS — Z8249 Family history of ischemic heart disease and other diseases of the circulatory system: Secondary | ICD-10-CM | POA: Diagnosis not present

## 2021-03-17 DIAGNOSIS — R58 Hemorrhage, not elsewhere classified: Secondary | ICD-10-CM | POA: Diagnosis not present

## 2021-03-17 DIAGNOSIS — I10 Essential (primary) hypertension: Secondary | ICD-10-CM | POA: Diagnosis not present

## 2021-03-17 DIAGNOSIS — I4510 Unspecified right bundle-branch block: Secondary | ICD-10-CM | POA: Diagnosis present

## 2021-03-17 DIAGNOSIS — Z885 Allergy status to narcotic agent status: Secondary | ICD-10-CM | POA: Diagnosis not present

## 2021-03-17 DIAGNOSIS — I4891 Unspecified atrial fibrillation: Secondary | ICD-10-CM | POA: Diagnosis not present

## 2021-03-17 DIAGNOSIS — I25110 Atherosclerotic heart disease of native coronary artery with unstable angina pectoris: Secondary | ICD-10-CM | POA: Diagnosis not present

## 2021-03-17 DIAGNOSIS — I712 Thoracic aortic aneurysm, without rupture: Secondary | ICD-10-CM | POA: Diagnosis not present

## 2021-03-17 DIAGNOSIS — Z79899 Other long term (current) drug therapy: Secondary | ICD-10-CM | POA: Diagnosis not present

## 2021-03-17 DIAGNOSIS — Z20822 Contact with and (suspected) exposure to covid-19: Secondary | ICD-10-CM | POA: Diagnosis not present

## 2021-03-17 DIAGNOSIS — I483 Typical atrial flutter: Secondary | ICD-10-CM | POA: Diagnosis not present

## 2021-03-17 DIAGNOSIS — I252 Old myocardial infarction: Secondary | ICD-10-CM | POA: Diagnosis not present

## 2021-03-17 DIAGNOSIS — I200 Unstable angina: Secondary | ICD-10-CM | POA: Diagnosis not present

## 2021-03-17 DIAGNOSIS — E7800 Pure hypercholesterolemia, unspecified: Secondary | ICD-10-CM | POA: Diagnosis not present

## 2021-03-17 DIAGNOSIS — K219 Gastro-esophageal reflux disease without esophagitis: Secondary | ICD-10-CM | POA: Diagnosis not present

## 2021-03-17 DIAGNOSIS — I351 Nonrheumatic aortic (valve) insufficiency: Secondary | ICD-10-CM | POA: Diagnosis not present

## 2021-03-17 DIAGNOSIS — Z888 Allergy status to other drugs, medicaments and biological substances status: Secondary | ICD-10-CM | POA: Diagnosis not present

## 2021-03-17 DIAGNOSIS — I2510 Atherosclerotic heart disease of native coronary artery without angina pectoris: Secondary | ICD-10-CM | POA: Diagnosis not present

## 2021-03-17 DIAGNOSIS — Z9989 Dependence on other enabling machines and devices: Secondary | ICD-10-CM | POA: Diagnosis not present

## 2021-03-17 DIAGNOSIS — Z955 Presence of coronary angioplasty implant and graft: Secondary | ICD-10-CM | POA: Diagnosis not present

## 2021-03-17 DIAGNOSIS — I480 Paroxysmal atrial fibrillation: Secondary | ICD-10-CM | POA: Diagnosis not present

## 2021-03-17 DIAGNOSIS — Z981 Arthrodesis status: Secondary | ICD-10-CM | POA: Diagnosis not present

## 2021-03-17 DIAGNOSIS — E785 Hyperlipidemia, unspecified: Secondary | ICD-10-CM | POA: Diagnosis present

## 2021-03-17 DIAGNOSIS — G4733 Obstructive sleep apnea (adult) (pediatric): Secondary | ICD-10-CM | POA: Diagnosis not present

## 2021-03-17 DIAGNOSIS — I77810 Thoracic aortic ectasia: Secondary | ICD-10-CM | POA: Diagnosis not present

## 2021-03-17 DIAGNOSIS — Z7901 Long term (current) use of anticoagulants: Secondary | ICD-10-CM | POA: Diagnosis not present

## 2021-03-17 LAB — TROPONIN I (HIGH SENSITIVITY)
Troponin I (High Sensitivity): 9 ng/L (ref <18)
Troponin I (High Sensitivity): 10 ng/L (ref <18)

## 2021-03-17 LAB — COMPREHENSIVE METABOLIC PANEL
ALT: 23 U/L (ref 0–44)
AST: 24 U/L (ref 15–41)
Albumin: 3.4 g/dL — ABNORMAL LOW (ref 3.5–5.0)
Alkaline Phosphatase: 66 U/L (ref 38–126)
Anion gap: 6 (ref 5–15)
BUN: 18 mg/dL (ref 8–23)
CO2: 28 mmol/L (ref 22–32)
Calcium: 8.7 mg/dL — ABNORMAL LOW (ref 8.9–10.3)
Chloride: 105 mmol/L (ref 98–111)
Creatinine, Ser: 1.03 mg/dL (ref 0.61–1.24)
GFR, Estimated: >60 mL/min (ref >60)
Glucose, Bld: 97 mg/dL (ref 70–99)
Potassium: 3.8 mmol/L (ref 3.5–5.1)
Sodium: 139 mmol/L (ref 135–145)
Total Bilirubin: 0.8 mg/dL (ref 0.3–1.2)
Total Protein: 5.8 g/dL — ABNORMAL LOW (ref 6.5–8.1)

## 2021-03-17 LAB — HEMOGLOBIN A1C
Hgb A1c MFr Bld: 5.8 % — ABNORMAL HIGH (ref 4.8–5.6)
Mean Plasma Glucose: 119.76 mg/dL

## 2021-03-17 LAB — CBC WITH DIFFERENTIAL/PLATELET
Abs Immature Granulocytes: 0.02 10*3/uL (ref 0.00–0.07)
Basophils Absolute: 0 10*3/uL (ref 0.0–0.1)
Basophils Relative: 1 %
Eosinophils Absolute: 0.2 10*3/uL (ref 0.0–0.5)
Eosinophils Relative: 3 %
HCT: 36.6 % — ABNORMAL LOW (ref 39.0–52.0)
Hemoglobin: 11.5 g/dL — ABNORMAL LOW (ref 13.0–17.0)
Immature Granulocytes: 0 %
Lymphocytes Relative: 38 %
Lymphs Abs: 2.4 10*3/uL (ref 0.7–4.0)
MCH: 23.6 pg — ABNORMAL LOW (ref 26.0–34.0)
MCHC: 31.4 g/dL (ref 30.0–36.0)
MCV: 75.2 fL — ABNORMAL LOW (ref 80.0–100.0)
Monocytes Absolute: 0.7 10*3/uL (ref 0.1–1.0)
Monocytes Relative: 10 %
Neutro Abs: 3.2 10*3/uL (ref 1.7–7.7)
Neutrophils Relative %: 48 %
Platelets: 233 10*3/uL (ref 150–400)
RBC: 4.87 MIL/uL (ref 4.22–5.81)
RDW: 17.1 % — ABNORMAL HIGH (ref 11.5–15.5)
WBC: 6.5 10*3/uL (ref 4.0–10.5)
nRBC: 0 % (ref 0.0–0.2)

## 2021-03-17 LAB — PHOSPHORUS: Phosphorus: 3.7 mg/dL (ref 2.5–4.6)

## 2021-03-17 LAB — LIPID PANEL
Cholesterol: 103 mg/dL (ref 0–200)
HDL: 33 mg/dL — ABNORMAL LOW (ref >40)
LDL Cholesterol: 58 mg/dL (ref 0–99)
Total CHOL/HDL Ratio: 3.1 RATIO
Triglycerides: 59 mg/dL (ref <150)
VLDL: 12 mg/dL (ref 0–40)

## 2021-03-17 LAB — SARS CORONAVIRUS 2 (TAT 6-24 HRS): SARS Coronavirus 2: NEGATIVE

## 2021-03-17 LAB — ECHOCARDIOGRAM COMPLETE
Area-P 1/2: 3.53 cm2
Height: 73 in
S' Lateral: 3.4 cm
Weight: 3656 oz

## 2021-03-17 LAB — T4, FREE: Free T4: 0.96 ng/dL (ref 0.61–1.12)

## 2021-03-17 LAB — MAGNESIUM: Magnesium: 1.8 mg/dL (ref 1.7–2.4)

## 2021-03-17 LAB — TSH: TSH: 5.176 u[IU]/mL — ABNORMAL HIGH (ref 0.350–4.500)

## 2021-03-17 MED ORDER — SODIUM CHLORIDE 0.9% FLUSH
3.0000 mL | Freq: Two times a day (BID) | INTRAVENOUS | Status: DC
  Administered 2021-03-17: 3 mL via INTRAVENOUS

## 2021-03-17 MED ORDER — SODIUM CHLORIDE 0.9 % WEIGHT BASED INFUSION
1.0000 mL/kg/h | INTRAVENOUS | Status: DC
  Administered 2021-03-18: 1 mL/kg/h via INTRAVENOUS

## 2021-03-17 MED ORDER — PANTOPRAZOLE SODIUM 40 MG PO TBEC
40.0000 mg | DELAYED_RELEASE_TABLET | Freq: Every day | ORAL | Status: DC
  Administered 2021-03-17 – 2021-03-18 (×2): 40 mg via ORAL
  Filled 2021-03-17 (×2): qty 1

## 2021-03-17 MED ORDER — SODIUM CHLORIDE 0.9 % WEIGHT BASED INFUSION
3.0000 mL/kg/h | INTRAVENOUS | Status: DC
  Administered 2021-03-18: 3 mL/kg/h via INTRAVENOUS

## 2021-03-17 MED ORDER — PANTOPRAZOLE SODIUM 40 MG IV SOLR
40.0000 mg | Freq: Every day | INTRAVENOUS | Status: DC
  Administered 2021-03-17: 40 mg via INTRAVENOUS
  Filled 2021-03-17: qty 40

## 2021-03-17 MED ORDER — SODIUM CHLORIDE 0.9 % IV SOLN: 250.0000 mL | INTRAVENOUS | Status: DC | PRN

## 2021-03-17 MED ORDER — MORPHINE SULFATE (PF) 2 MG/ML IV SOLN
2.0000 mg | INTRAVENOUS | Status: DC | PRN
  Administered 2021-03-17: 2 mg via INTRAVENOUS
  Filled 2021-03-17: qty 1

## 2021-03-17 MED ORDER — ASPIRIN 81 MG PO CHEW
81.0000 mg | CHEWABLE_TABLET | ORAL | Status: AC
  Administered 2021-03-18: 81 mg via ORAL
  Filled 2021-03-17: qty 1

## 2021-03-17 MED ORDER — ONDANSETRON HCL 4 MG/2ML IJ SOLN: 4.0000 mg | Freq: Four times a day (QID) | INTRAMUSCULAR | Status: DC | PRN

## 2021-03-17 MED ORDER — SODIUM CHLORIDE 0.9% FLUSH: 3.0000 mL | INTRAVENOUS | Status: DC | PRN

## 2021-03-17 MED ORDER — HEPARIN (PORCINE) 25000 UT/250ML-% IV SOLN
1550.0000 [IU]/h | INTRAVENOUS | Status: DC
  Administered 2021-03-17: 1400 [IU]/h via INTRAVENOUS
  Filled 2021-03-17: qty 250

## 2021-03-17 MED ORDER — ASPIRIN EC 81 MG PO TBEC
81.0000 mg | DELAYED_RELEASE_TABLET | Freq: Every day | ORAL | Status: DC
  Administered 2021-03-17 – 2021-03-19 (×2): 81 mg via ORAL
  Filled 2021-03-17 (×2): qty 1

## 2021-03-17 NOTE — Consult Note (Signed)
Cardiology Consultation:   [name] MRN: [id number]; DOB: Mar 28, 1946  Admit date: 03/16/2021 Date of Consult: 03/17/2021  PCP:  Kathyrn Lass, MD   [facility] Providers Cardiologist:  [name], MD        Patient Profile and HPI:   Jose Stone is a 75 y.o. male with a hx of CAD s/p PCI,  aortic valve regurg, atrial fibrillation s/p ablation on xarelto, VSD, right bundle branch block who is being seen 03/17/2021 for the evaluation of CP at the request of Dr. Roel Cluck. Followed by Dr. Rayann Heman cardiology. He states that the CP started this afternoon while working in garage. Has never had such chest pain. He took 2 doses of sublingual nitroglycerin though thinks it may have been out of date or ineffective it did not provide relief. He also felt some dizziness and his BP was mildly low. He called EMS and was brought to the ED. Here troponin are flat, no new EKG changes; HR in 50s. In the ED, CP resolved with morphine. He states the CP comes and goes for 10 second after every 20 mins. Pain relieved by morphine but not by nitro. Can walk a lot. CP is not improved or worsened by anything. Takes xarelto at home.   Relevant Cardiac Hx:  left heart catheterization by Dr. Doylene Canard. This demonstrated a 60-70% mid LAD stenosis and a 90% obtuse marginal stenosis. Subsequently he underwent coronary intervention with placement of a 2.5 x 15 mm Xience Alpine stent to the mid OM. After this he became asymptomatic. He was going to have stress testing to determine the significance of his mid LAD lesion however it appears to be not significantly changed since his heart catheterization 2006. More recently he has had recurrent atrial arryhthmias, including atrial flutter and fibrillation. He was seen in the ER in 2018 for chest pain which was atypical. Recently underwent Afib ablation   Past Medical History:  Diagnosis Date  . Aortic root enlargement ([place])   . CAD (coronary artery disease)    . Complication of anesthesia    Hypotensive with general anesth.  . GERD (gastroesophageal reflux disease)   . Hyperlipidemia   . Hypertension   . Melanoma ([place])   . Persistent atrial fibrillation ([place])    Afib,Aflutter  . Typical atrial flutter ([place])   . 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 Ventricular septal defect     Past Surgical History:  Procedure Laterality Date  . ATRIAL FIBRILLATION ABLATION N/A 07/20/2020   Procedure: ATRIAL FIBRILLATION ABLATION;  Surgeon: [name], MD;  Location: [place] CV LAB;  Service: Cardiovascular;  Laterality: N/A;  . CARDIAC CATHETERIZATION    . CARDIOVERSION N/A 02/29/2020   Procedure: CARDIOVERSION;  Surgeon: [name], MD;  Location: [facility] ENDOSCOPY;  Service: Cardiovascular;  Laterality: N/A;  . CARDIOVERSION N/A 06/28/2020   Procedure: CARDIOVERSION;  Surgeon: Thayer Headings, MD;  Location: [facility] ENDOSCOPY;  Service: Cardiovascular;  Laterality: N/A;  . DISTAL INTERPHALANGEAL JOINT FUSION Left 01/19/2019   Procedure: HALLUX INTERPHANGEAL JOINT FUSION LEFT FOOT;  Surgeon: Trula Slade, DPM;  Location: [place];  Service: Podiatry;  Laterality: Left;  LEFT BLOCK  . hipospedious    . LEFT HEART CATHETERIZATION WITH CORONARY ANGIOGRAM N/A 08/14/2014   Procedure: LEFT HEART CATHETERIZATION WITH CORONARY ANGIOGRAM;  Surgeon: Birdie Riddle, MD;  Location: [place] CATH LAB;  Service: Cardiovascular;  Laterality: N/A;  . MASS EXCISION Left 01/19/2019  Procedure: EXCISION BENIGN LESION 2.OCM LEFT FOOT;  Surgeon: Trula Slade, DPM;  Location: [place];  Service: Podiatry;  Laterality: Left;  . SHOULDER ARTHROSCOPY WITH SUBACROMIAL DECOMPRESSION Left 05/03/2020   Procedure: SHOULDER ARTHROSCOPY DEBRIDEMENT PARTIAL ROTATOR CUFF REPAIR, SUBACROMIAL DECOMPRESSION, BICEPS TENOTOMY;  Surgeon: Tania Ade, MD;  Location: [facility];  Service: Orthopedics;  Laterality: Left;  . SHOULDER SURGERY    . TONSILLECTOMY   1955      Inpatient Medications: Scheduled Meds: . atorvastatin  80 mg Oral Daily  . pantoprazole (PROTONIX) IV  40 mg Intravenous QHS  . rivaroxaban  20 mg Oral Daily   Continuous Infusions: . sodium chloride 75 mL/hr (03/16/21 2314)   PRN Meds: acetaminophen **OR** acetaminophen, morphine injection, nitroGLYCERIN, ondansetron (ZOFRAN) IV  Allergies:    Allergies  Allergen Reactions  . Ciprofloxacin     Other reaction(s): bulging aorta  . Codeine Nausea Only    Can tolerate with benadryl  . Diltiazem Cd [Diltiazem Hcl]     Other reaction(s): Bradycardia  . Mupirocin Itching  . Neosporin [Bacitracin-Polymyxin B]     Other reaction(s): rash  . Other     Other reaction(s): rash  . Tramadol Hcl     Other reaction(s): rash  . Hydrocodone Rash    Can tolerate with benadryl  . Neosporin [Neomycin-Bacitracin Zn-Polymyx] Rash  . Oxycodone Rash    Can tolerate with benadryl    Social History:   Social History   Socioeconomic History  . Marital status: Married    Spouse name: Not on file  . Number of children: 2  . Years of education: master's  . Highest education level: Not on file  Occupational History  . Occupation: Civil engineer, contracting    Comment: retired  Tobacco Use  . Smoking status: Never Smoker  . Smokeless tobacco: Never Used  Vaping Use  . Vaping Use: Never used  Substance and Sexual Activity  . Alcohol use: No  . Drug use: No  . Sexual activity: Yes    Birth control/protection: None  Other Topics Concern  . Not on file  Social History Narrative   Lives in [place] with spouse.   Retired Civil engineer, contracting   Social Determinants of Radio broadcast assistant Strain: Not on [employer] Insecurity: Not on file  Transportation Needs: Not on file  Physical Activity: Not on file  Stress: Not on file  Social Connections: Not on file  Intimate Partner Violence: Not on file    Family History:   Family History  Problem Relation Age of Onset  .  Pancreatic cancer Mother   . Aortic aneurysm Maternal Grandmother   . Heart attack Maternal Grandfather   . Diabetes Paternal Grandfather   . CAD Paternal Grandfather      ROS:  Please see the history of present illness.  All other ROS reviewed and negative.     Physical Exam/Data:   Vitals:   03/16/21 2300 03/16/21 2307 03/17/21 0003 03/17/21 0047  BP:  (!) 152/74  (!) 145/78  Pulse:  (!) 52 67 (!) 53  Resp:  18 18 18   Temp:  98 F (36.7 C)    TempSrc:  Oral    SpO2:   100% 100%  Weight: 103.6 kg     Height: 6\' 1"  (1.854 m)      No intake or output data in the 24 hours ending 03/17/21 0124 Last 3 Weights 03/16/2021 01/21/2021 11/14/2020  Weight (lbs) 228 lb 8 oz 238 lb  3.2 oz 245 lb  Weight (kg) 103.647 kg 108.047 kg 111.131 kg     Body mass index is 30.15 kg/m.  General:  Well nourished, well developed, in no acute distress HEENT: normal Lymph: no adenopathy Neck: no JVD Endocrine:  No thryomegaly Vascular: No carotid bruits; FA pulses 2+ bilaterally without bruits  Cardiac:  normal S1, S2; RRR; no murmur Lungs:  clear to auscultation bilaterally, no wheezing, rhonchi or rales  Abd: soft, nontender, no hepatomegaly  Ext: no edema Musculoskeletal:  No deformities, BUE and BLE strength normal and equal Skin: warm and dry  Neuro:  CNs 2-12 intact, no focal abnormalities noted Psych:  Normal affect   EKG:  The EKG was personally reviewed and demonstrates:   Relevant CV Studies:  Stress test in 2018 which was mildly abnormal Echo in 2021:   1. 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 Left ventricular ejection fraction, by estimation, is 60 to 65%.  The left ventricle has normal function. The left ventricle has no regional  wall motion abnormalities.  2. Right ventricular systolic function is normal. The right ventricular  size is normal. There is moderately elevated pulmonary artery systolic  pressure.  3. The mitral valve is  normal in structure. No evidence of mitral valve  regurgitation. No evidence of mitral stenosis.  4. The aortic valve is tricuspid. There is moderate thickening and  moderate calcification of the aortic valve. Aortic valve regurgitation is  trivial. No aortic stenosis is present.  5. Aortic dilatation noted. 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 The inferior vena cava is normal in size with greater than 50%  respiratory variability, suggesting right atrial pressure of 3 mmHg.   Laboratory Data:  High Sensitivity Troponin:   Recent Labs  Lab 03/16/21 1833 03/16/21 2033  TROPONINIHS 7 7     Chemistry Recent Labs  Lab 03/16/21 1833  NA 139  K 4.1  CL 106  CO2 26  GLUCOSE 92  BUN 21  CREATININE 1.09  CALCIUM 8.8*  GFRNONAA >60  ANIONGAP 7    Recent Labs  Lab 03/16/21 1833  PROT 6.0*  ALBUMIN 3.5  AST 28  ALT 25  ALKPHOS 68  BILITOT 0.9   Hematology Recent Labs  Lab 03/16/21 1833 03/17/21 0010  WBC 8.9 6.5  RBC 4.91 4.87  HGB 11.7* 11.5*  HCT 37.2* 36.6*  MCV 75.8* 75.2*  MCH 23.8* 23.6*  MCHC 31.5 31.4  RDW 17.2* 17.1*  PLT 233 233   BNPNo results for input(s): BNP, PROBNP in the last 168 hours.  DDimer No results for input(s): DDIMER in the last 168 hours.   Radiology/Studies:  DG Chest 2 View  Result Date: 03/16/2021 CLINICAL DATA:  Chest pain EXAM: CHEST - 2 VIEW COMPARISON:  July 13, 2018 FINDINGS: Small granuloma left lower lobe region. No edema or airspace opacity. Heart is upper normal in size with pulmonary vascularity normal. No adenopathy. No pneumothorax. No bone lesions. Focal calcification in right carotid artery. Colonic interposition between right hemidiaphragm and liver noted incidentally. IMPRESSION: No edema or airspace opacity. Small granuloma left lower lobe. Stable cardiac silhouette. There is right carotid artery calcification. Electronically Signed   By: Lowella Grip  III M.D.   On: 03/16/2021 19:35     Assessment and Plan:  # CAD s/p PCI # Afib # Atypical chest pain  -Chest pain is atypical.  Troponin are flat; no new significant EKG changes. Will continue to trend troponin and EKG. He certainly has risk factors for CAD; so will suggest possible stress test or even LHC on Monday (whatever primary cardiologist and team think is reasonable) -He states that the pain is not relieved by nitro paste and gets help with morphine. Will give morphine PRN for pain relief. If pain persists, can consider NTG drip. However pain currently is 1/10 and he is comfortable.  -Continue with atorvastatin 80 mg -Continue with Xarelto -Will hold BB for now considering his HR in 50s. No clear indication for PPM. Unlikely that his symptoms are driven by HR of 38B.  -Will get LDL and A1c for aggressive risk factor modification.   For questions or updates, please contact [place] Please consult [email or web address] for contact info under    Signed, Jaci Lazier, MD  03/17/2021 1:24 AM

## 2021-03-17 NOTE — Progress Notes (Signed)
ANTICOAGULATION CONSULT NOTE - Initial Consult  Pharmacy Consult for Heparin Indication: atrial fibrillation  Allergies  Allergen Reactions  . Ciprofloxacin     Other reaction(s): bulging aorta  . Codeine Nausea Only    Can tolerate with benadryl  . Diltiazem Cd [Diltiazem Hcl]     Other reaction(s): Bradycardia  . Mupirocin Itching  . Neosporin [Bacitracin-Polymyxin B]     Other reaction(s): rash  . Other     Other reaction(s): rash  . Tramadol Hcl     Other reaction(s): rash  . Hydrocodone Rash    Can tolerate with benadryl  . Neosporin [Neomycin-Bacitracin Zn-Polymyx] Rash  . Oxycodone Rash    Can tolerate with benadryl    Patient Measurements: Height: 6\' 1"  (185.4 cm) Weight: 103.6 kg (228 lb 8 oz) IBW/kg (Calculated) : 79.9 Heparin Dosing Weight: 101 kg  Vital Signs: Temp: 97.9 F (36.6 C) (05/15 0939) Temp Source: Oral (05/15 0939) BP: 137/84 (05/15 0939) Pulse Rate: 47 (05/15 0939)  Labs: Recent Labs    03/16/21 1833 03/16/21 2033 03/17/21 0010 03/17/21 0229  HGB 11.7*  --  11.5*  --   HCT 37.2*  --  36.6*  --   PLT 233  --  233  --   CREATININE 1.09  --  1.03  --   TROPONINIHS 7 7 9 10     Estimated Creatinine Clearance: 80.8 mL/min (by C-G formula based on SCr of 1.03 mg/dL).   Medical History: Past Medical History:  Diagnosis Date  . Aortic root enlargement ([place])   . CAD (coronary artery disease)   . Complication of anesthesia    Hypotensive with general anesth.  . GERD (gastroesophageal reflux disease)   . Hyperlipidemia   . Hypertension   . Melanoma ([place])   . Persistent atrial fibrillation ([place])    Afib,Aflutter  . Typical atrial flutter ([place])   . 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 Ventricular septal defect     Assessment: 49 yoM admitted for chest pain, hx CAD with remote PCI, VSD, and RBB. History of PAF s/p ablation, on Xarelto prior to admission. Pharmacy consulted to dose heparin for afib  given plan for [facility] 5/16 to further evaluate chest pain.   Last dose Xarelto prior to admission reported 5/13 at 1800, did receive dose in ED yesterday 5/14 at 2330. Plan to start IV heparin when next dose Xarelto would be due.  CBC stable, no s/sx bleeding reported. Baseline plt 233.  Goal of Therapy:  Heparin level 0.3-0.7 units/ml aPTT 66-102 seconds Monitor platelets by anticoagulation protocol: Yes   Plan:  Start IV heparin 1400 units/hr at 2330 aPTT and heparin level 6 hours after infusion started Monitor daily aPTT and heparin levels until correlate, daily CBC  Fara Olden, PharmD PGY-1 Pharmacy Resident 03/17/2021 10:34 AM Please see AMION for all pharmacy numbers

## 2021-03-17 NOTE — Progress Notes (Signed)
*  PRELIMINARY RESULTS* Echocardiogram 2D Echocardiogram has been performed.  Samuel Germany 03/17/2021, 2:03 PM

## 2021-03-17 NOTE — Progress Notes (Signed)
Progress Note  Patient Name: Jose Stone Date of Encounter: 03/17/2021  [facility] Cardiologist: Jose Casino, MD   Subjective   Patient seen by Cardiology fellow early this am after presenting to ER with Chest pain.  Has a hx of CAD in the past with remote PCI, VSD and RBBB.  He has never had CP and developed SSCP while working in his garage. Initial Trop neg and pain resolved with morphine.  Pain has been intermittent every 20 minutes lasting 10 seconds at a time. Remote heart cath with 60-70% mLAD and 80% OM s/p PCI of the OM.  He has a hx of PAF and is follwed by Jose Stone s/p afib ablation. EKG was nonischemic. Was bradycardic with HR in the 50's on admission.    Inpatient Medications    Scheduled Meds: . atorvastatin  80 mg Oral Daily  . pantoprazole (PROTONIX) IV  40 mg Intravenous QHS  . rivaroxaban  20 mg Oral Daily   Continuous Infusions:  PRN Meds: acetaminophen **OR** acetaminophen, morphine injection, nitroGLYCERIN, ondansetron (ZOFRAN) IV   Vital Signs    Vitals:   03/17/21 0047 03/17/21 0118 03/17/21 0447 03/17/21 0939  BP: (!) 145/78 (!) 133/92 (!) 142/74 137/84  Pulse: (!) 53 (!) 54 (!) 45 (!) 47  Resp: 18  20 18   Temp:   98.2 F (36.8 C) 97.9 F (36.6 C)  TempSrc:   Oral Oral  SpO2: 100% 99% 99% 98%  Weight:      Height:        Intake/Output Summary (Last 24 hours) at 03/17/2021 0958 Last data filed at 03/17/2021 0308 Gross per 24 hour  Intake 287.98 ml  Output --  Net 287.98 ml   Last 3 Weights 03/16/2021 01/21/2021 11/14/2020  Weight (lbs) 228 lb 8 oz 238 lb 3.2 oz 245 lb  Weight (kg) 103.647 kg 108.047 kg 111.131 kg      Telemetry    Sinus bradycardia with PACs - Personally Reviewed  ECG    EKG showed sinus bradycardia with new T wave inversion in III - Personally Reviewed  Physical Exam   GEN: No acute distress.   Neck: No JVD Cardiac: RRR, no murmurs, rubs, or gallops.  Respiratory: Clear to auscultation  bilaterally. GI: Soft, nontender, non-distended  MS: No edema; No deformity. Neuro:  Nonfocal  Psych: Normal affect   Labs    High Sensitivity Troponin:   Recent Labs  Lab 03/16/21 1833 03/16/21 2033 03/17/21 0010 03/17/21 0229  TROPONINIHS 7 7 9 10       Chemistry Recent Labs  Lab 03/16/21 1833 03/17/21 0010  NA 139 139  K 4.1 3.8  CL 106 105  CO2 26 28  GLUCOSE 92 97  BUN 21 18  CREATININE 1.09 1.03  CALCIUM 8.8* 8.7*  PROT 6.0* 5.8*  ALBUMIN 3.5 3.4*  AST 28 24  ALT 25 23  ALKPHOS 68 66  BILITOT 0.9 0.8  GFRNONAA >60 >60  ANIONGAP 7 6     Hematology Recent Labs  Lab 03/16/21 1833 03/17/21 0010  WBC 8.9 6.5  RBC 4.91 4.87  HGB 11.7* 11.5*  HCT 37.2* 36.6*  MCV 75.8* 75.2*  MCH 23.8* 23.6*  MCHC 31.5 31.4  RDW 17.2* 17.1*  PLT 233 233    BNPNo results for input(s): BNP, PROBNP in the last 168 hours.   DDimer No results for input(s): DDIMER in the last 168 hours.   CHA2DS2-VASc Score = 3   This indicates a  3.2% annual risk of stroke. The patient's score is based upon: CHF History: No HTN History: Yes Diabetes History: No Stroke History: No Vascular Disease History: Yes Age Score: 1 Gender Score: 0      Radiology    DG Chest 2 View  Result Date: 03/16/2021 CLINICAL DATA:  Chest pain EXAM: CHEST - 2 VIEW COMPARISON:  July 13, 2018 FINDINGS: Small granuloma left lower lobe region. No edema or airspace opacity. Heart is upper normal in size with pulmonary vascularity normal. No adenopathy. No pneumothorax. No bone lesions. Focal calcification in right carotid artery. Colonic interposition between right hemidiaphragm and liver noted incidentally. IMPRESSION: No edema or airspace opacity. Small granuloma left lower lobe. Stable cardiac silhouette. There is right carotid artery calcification. Electronically Signed   By: Lowella Grip III M.D.   On: 03/16/2021 19:35    Cardiac Studies   none  Patient Profile     76 y.o. male  with a hx of CAD s/p PCI,  aortic valve regurg, atrial fibrillation s/p ablation on xarelto, VSD, right bundle branch block who is being seen 03/17/2021 for the evaluation of CP at the request of Jose Stone.   Assessment & Plan    1.  Chest pain -it is atypical but associated with diaphoresis, weakness and dizziness along with hypotension -not relieved with NTG but improved with MSO4 -still having brief episodes of pain -EKG with new inferior T wave inversions -hsTrop neg x 4 -given his new T wave inversion in III along with continued pain, and marked sx of diaphoresis weakness and hypotension during pain yesterday as well as known residual CAD on last cath of 60-70% LAD, will plan LHC in am to redefine coronary anatomy -Shared Decision Making/Informed Consent :[id number]} The risks [stroke (1 in 1000), death (1 in 1000), kidney failure [usually temporary] (1 in 500), bleeding (1 in 200), allergic reaction [possibly serious] (1 in 200)], benefits (diagnostic support and management of coronary artery disease) and alternatives of a cardiac catheterization were discussed in detail with Jose Stone and he is willing to proceed. -will hold Xarelto ( last dose PM of 5/14) -IV Heparin per pharmacy -continue high dose statin  -add ASA 81mg  daily -no BB due to bradycardia  2.  ASCAD -Remote heart cath with 60-70% mLAD and 80% OM s/p PCI of the OM/ -he has not been on BB due to hx of bradycardia -continue high dose statin -he has not been on ASA due to Xarelto  3.  PAF -s/p remote ablation -maintaining sinus [name] on exam today -not on any rate slowing meds -he did have diaphoresis and weakness in setting of low BP>>? Whether he may have had worsening bradycardia as he says that his HR was in the 40's when he got here which is not normal and usually in the 50's -continue to monitor on tele -stop Xarelto for cath -will have pharmacy dose IV Heparin in the interim  4.  HLD -LDL goal < 70 -check  FLP in am -continue Atorvastatin 80mg  daily  5.  HTN -BP well controlled on diet      For questions or updates, please contact [place] Please consult [email or web address] for contact info under        Signed, Jose Him, MD  03/17/2021, 9:58 AM

## 2021-03-17 NOTE — H&P (View-Only) (Signed)
Progress Note  Patient Name: Jose Stone Date of Encounter: 03/17/2021  [facility] Cardiologist: [name], MD   Subjective   Patient seen by Cardiology fellow early this am after presenting to ER with Chest pain.  Has a hx of CAD in the past with remote PCI, VSD and RBBB.  He has never had CP and developed SSCP while working in his garage. Initial Trop neg and pain resolved with morphine.  Pain has been intermittent every 20 minutes lasting 10 seconds at a time. Remote heart cath with 60-70% mLAD and 80% OM s/p PCI of the OM.  He has a hx of PAF and is follwed by Dr. Rayann Heman s/p afib ablation. EKG was nonischemic. Was bradycardic with HR in the 50's on admission.    Inpatient Medications    Scheduled Meds: . atorvastatin  80 mg Oral Daily  . pantoprazole (PROTONIX) IV  40 mg Intravenous QHS  . rivaroxaban  20 mg Oral Daily   Continuous Infusions:  PRN Meds: acetaminophen **OR** acetaminophen, morphine injection, nitroGLYCERIN, ondansetron (ZOFRAN) IV   Vital Signs    Vitals:   03/17/21 0047 03/17/21 0118 03/17/21 0447 03/17/21 0939  BP: (!) 145/78 (!) 133/92 (!) 142/74 137/84  Pulse: (!) 53 (!) 54 (!) 45 (!) 47  Resp: 18  20 18   Temp:   98.2 F (36.8 C) 97.9 F (36.6 C)  TempSrc:   Oral Oral  SpO2: 100% 99% 99% 98%  Weight:      Height:        Intake/Output Summary (Last 24 hours) at 03/17/2021 0958 Last data filed at 03/17/2021 0308 Gross per 24 hour  Intake 287.98 ml  Output --  Net 287.98 ml   Last 3 Weights 03/16/2021 01/21/2021 11/14/2020  Weight (lbs) 228 lb 8 oz 238 lb 3.2 oz 245 lb  Weight (kg) 103.647 kg 108.047 kg 111.131 kg      Telemetry    Sinus bradycardia with PACs - Personally Reviewed  ECG    EKG showed sinus bradycardia with new T wave inversion in III - Personally Reviewed  Physical Exam   GEN: No acute distress.   Neck: No JVD Cardiac: RRR, no murmurs, rubs, or gallops.  Respiratory: Clear to auscultation  bilaterally. GI: Soft, nontender, non-distended  MS: No edema; No deformity. Neuro:  Nonfocal  Psych: Normal affect   Labs    High Sensitivity Troponin:   Recent Labs  Lab 03/16/21 1833 03/16/21 2033 03/17/21 0010 03/17/21 0229  TROPONINIHS 7 7 9 10       Chemistry Recent Labs  Lab 03/16/21 1833 03/17/21 0010  NA 139 139  K 4.1 3.8  CL 106 105  CO2 26 28  GLUCOSE 92 97  BUN 21 18  CREATININE 1.09 1.03  CALCIUM 8.8* 8.7*  PROT 6.0* 5.8*  ALBUMIN 3.5 3.4*  AST 28 24  ALT 25 23  ALKPHOS 68 66  BILITOT 0.9 0.8  GFRNONAA >60 >60  ANIONGAP 7 6     Hematology Recent Labs  Lab 03/16/21 1833 03/17/21 0010  WBC 8.9 6.5  RBC 4.91 4.87  HGB 11.7* 11.5*  HCT 37.2* 36.6*  MCV 75.8* 75.2*  MCH 23.8* 23.6*  MCHC 31.5 31.4  RDW 17.2* 17.1*  PLT 233 233    BNPNo results for input(s): BNP, PROBNP in the last 168 hours.   DDimer No results for input(s): DDIMER in the last 168 hours.   CHA2DS2-VASc Score = 3   This indicates a  3.2% annual risk of stroke. The patient's score is based upon: CHF History: No HTN History: Yes Diabetes History: No Stroke History: No Vascular Disease History: Yes Age Score: 1 Gender Score: 0      Radiology    DG Chest 2 View  Result Date: 03/16/2021 CLINICAL DATA:  Chest pain EXAM: CHEST - 2 VIEW COMPARISON:  July 13, 2018 FINDINGS: Small granuloma left lower lobe region. No edema or airspace opacity. Heart is upper normal in size with pulmonary vascularity normal. No adenopathy. No pneumothorax. No bone lesions. Focal calcification in right carotid artery. Colonic interposition between right hemidiaphragm and liver noted incidentally. IMPRESSION: No edema or airspace opacity. Small granuloma left lower lobe. Stable cardiac silhouette. There is right carotid artery calcification. Electronically Signed   By: Lowella Grip III M.D.   On: 03/16/2021 19:35    Cardiac Studies   none  Patient Profile     75 y.o. male  with a hx of CAD s/p PCI,  aortic valve regurg, atrial fibrillation s/p ablation on xarelto, VSD, right bundle branch block who is being seen 03/17/2021 for the evaluation of CP at the request of Dr. Roel Cluck.   Assessment & Plan    1.  Chest pain -it is atypical but associated with diaphoresis, weakness and dizziness along with hypotension -not relieved with NTG but improved with MSO4 -still having brief episodes of pain -EKG with new inferior T wave inversions -hsTrop neg x 4 -given his new T wave inversion in III along with continued pain, and marked sx of diaphoresis weakness and hypotension during pain yesterday as well as known residual CAD on last cath of 60-70% LAD, will plan LHC in am to redefine coronary anatomy -Shared Decision Making/Informed Consent :[id number]} The risks [stroke (1 in 1000), death (1 in 1000), kidney failure [usually temporary] (1 in 500), bleeding (1 in 200), allergic reaction [possibly serious] (1 in 200)], benefits (diagnostic support and management of coronary artery disease) and alternatives of a cardiac catheterization were discussed in detail with Jose Stone and he is willing to proceed. -will hold Xarelto ( last dose PM of 5/14) -IV Heparin per pharmacy -continue high dose statin  -add ASA 81mg  daily -no BB due to bradycardia  2.  ASCAD -Remote heart cath with 60-70% mLAD and 80% OM s/p PCI of the OM/ -he has not been on BB due to hx of bradycardia -continue high dose statin -he has not been on ASA due to Xarelto  3.  PAF -s/p remote ablation -maintaining sinus Chait on exam today -not on any rate slowing meds -he did have diaphoresis and weakness in setting of low BP>>? Whether he may have had worsening bradycardia as he says that his HR was in the 40's when he got here which is not normal and usually in the 50's -continue to monitor on tele -stop Xarelto for cath -will have pharmacy dose IV Heparin in the interim  4.  HLD -LDL goal < 70 -check  FLP in am -continue Atorvastatin 80mg  daily  5.  HTN -BP well controlled on diet      For questions or updates, please contact [employer] Please consult [email or web address] for contact info under        Signed, Fransico Him, MD  03/17/2021, 9:58 AM

## 2021-03-17 NOTE — Progress Notes (Addendum)
PROGRESS NOTE    Jose Stone  ZSW:[id number] DOB: 1946/09/27 DOA: 03/16/2021 PCP: Kathyrn Lass, MD    Brief Narrative:  Jose Stone is a 75 year old male with past medical history significant for CAD s/p PCI, VSD, right bundle branch block, paroxysmal atrial fibrillation on Xarelto, HLD, GERD, HTN, aortic valve repair who presents to [facility] ED on 5/14 by EMS with chief complaint of substernal chest pain.  Patient reports onset while seated working in his garage.  Patient also complained of dizziness and diaphoresis at the time.  Patient took 2 sublingual nitroglycerin with no relief and was given nitroglycerin and 324 mg of aspirin by EMS prior to arrival.  The pain is reported as substernal with no radiation, intermittent with no known exacerbating or alleviating factors.  In the ED, temperature 98.6 F, HR 56, RR 14, BP 121/61, SPO2 96% on room air.  Sodium 139, potassium 4.1, chloride 106, CO2 26, glucose 92, BUN 21, creatinine 1.09.  Troponin 7>7. WBC 8.9, Hgb 11.7, Plt 233.  Chest x-ray with no edema or airspace opacity, small granuloma left lower lobe.  EKG with sinus bradycardia, rate 49, QTc 373, T wave inversion lead III, no ST elevation/depression or other T wave inversion.  Cardiology was consulted.  Hospitalist service consulted for further evaluation and management.   Assessment & Plan:   Active Problems:   Essential hypertension   Hyperlipidemia   GERD (gastroesophageal reflux disease)   Coronary artery disease due to lipid rich plaque   OSA on CPAP   Symptomatic bradycardia   PAF (paroxysmal atrial fibrillation) (HCC)   Chest pain   Unstable angina (HCC)   Chest pain Hx CAD s/p PCI Patient presenting to ED after acute onset chest pain at rest with associated diaphoresis, weakness and dizziness.  No relief with nitroglycerin, but improved with IV morphine.  Troponin 7>7>9>10, within normal limits.  Chest x-ray with no acute findings.  EKG notable for sinus  bradycardia with a rate of 49, normal QTC with T wave inversion in lead III.  Patient with remote heart catheterization with 60-70% mLAD and 80% OM occlusion s/p PCI OM. --Cardiology following, appreciate assistance --Echocardiogram: Pending --Heparin drip --Atorvastatin 80 mg p.o. daily --Aspirin 81 mg p.o. daily --Not on beta-blocker due to bradycardia --Plan LHC 5/16, NPO after MN  Paroxysmal atrial fibrillation History of remote ablation.  Currently in sinus bradycardia.  Not on rate controlling medications due to bradycardia. --Holding Xarelto for cardiac catheterization --Heparin drip --Monitor on telemetry  Subclinical hypothyroidism TSH elevated 5.176 with free T4 0.96, within normal limits.   -- Free T3 pending  HLD --Lipid panel: Pending --Atorvastatin 80 mg p.o. daily  GERD: Continue PPI  OSA: Nocturnal CPAP  DVT prophylaxis: Heparin drip   Code Status: Full Code Family Communication: No family present at bedside this morning  Disposition Plan:  Level of care: Progressive Status is: Observation  The patient remains OBS appropriate and will d/c before 2 midnights.  Dispo: The patient is from: Home              Anticipated d/c is to: Home              Patient currently is not medically stable to d/c.   Difficult to place patient No   Consultants:   Cardiology  Procedures:   TTE: Pending  Antimicrobials:   None   Subjective: Patient seen and examined at bedside, resting comfortably.  Currently denies any further chest pain.  Seen  by cardiology this morning with plans of left heart catheterization tomorrow. No family present.  No other complaints or concerns at this time.  Denies headache, no visual changes, no fever/chills/night sweats, no nausea/vomiting/diarrhea, no chest pain, no dizziness currently, no palpitations, no abdominal pain, no shortness of breath, no weakness, no fatigue, no paresthesias.  Objective: Vitals:   03/17/21 0047 03/17/21  0118 03/17/21 0447 03/17/21 0939  BP: (!) 145/78 (!) 133/92 (!) 142/74 137/84  Pulse: (!) 53 (!) 54 (!) 45 (!) 47  Resp: 18  20 18   Temp:   98.2 F (36.8 C) 97.9 F (36.6 C)  TempSrc:   Oral Oral  SpO2: 100% 99% 99% 98%  Weight:      Height:        Intake/Output Summary (Last 24 hours) at 03/17/2021 1042 Last data filed at 03/17/2021 0308 Gross per 24 hour  Intake 287.98 ml  Output --  Net 287.98 ml   Filed Weights   03/16/21 2300  Weight: 103.6 kg    Examination:  General exam: Appears calm and comfortable  Respiratory system: Clear to auscultation. Respiratory effort normal.  On room air Cardiovascular system: S1 & S2 heard, bradycardic, regular rhythm. No JVD, murmurs, rubs, gallops or clicks. No pedal edema. Gastrointestinal system: Abdomen is nondistended, soft and nontender. No organomegaly or masses felt. Normal bowel sounds heard. Central nervous system: Alert and oriented. No focal neurological deficits. Extremities: Symmetric 5 x 5 power. Skin: No rashes, lesions or ulcers Psychiatry: Judgement and insight appear normal. Mood & affect appropriate.     Data Reviewed: I have personally reviewed following labs and imaging studies  CBC: Recent Labs  Lab 03/16/21 1833 03/17/21 0010  WBC 8.9 6.5  NEUTROABS 6.0 3.2  HGB 11.7* 11.5*  HCT 37.2* 36.6*  MCV 75.8* 75.2*  PLT 233 161   Basic Metabolic Panel: Recent Labs  Lab 03/16/21 1833 03/17/21 0010  NA 139 139  K 4.1 3.8  CL 106 105  CO2 26 28  GLUCOSE 92 97  BUN 21 18  CREATININE 1.09 1.03  CALCIUM 8.8* 8.7*  MG  --  1.8  PHOS  --  3.7   GFR: Estimated Creatinine Clearance: 80.8 mL/min (by C-G formula based on SCr of 1.03 mg/dL). Liver Function Tests: Recent Labs  Lab 03/16/21 1833 03/17/21 0010  AST 28 24  ALT 25 23  ALKPHOS 68 66  BILITOT 0.9 0.8  PROT 6.0* 5.8*  ALBUMIN 3.5 3.4*   No results for input(s): LIPASE, AMYLASE in the last 168 hours. No results for input(s): AMMONIA in  the last 168 hours. Coagulation Profile: No results for input(s): INR, PROTIME in the last 168 hours. Cardiac Enzymes: No results for input(s): CKTOTAL, CKMB, CKMBINDEX, TROPONINI in the last 168 hours. BNP (last 3 results) No results for input(s): PROBNP in the last 8760 hours. HbA1C: Recent Labs    03/17/21 0702  HGBA1C 5.8*   CBG: No results for input(s): GLUCAP in the last 168 hours. Lipid Profile: Recent Labs    03/17/21 0702  CHOL 103  HDL 33*  LDLCALC 58  TRIG 59  CHOLHDL 3.1   Thyroid Function Tests: Recent Labs    03/17/21 0010 03/17/21 0702  TSH 5.176*  --   FREET4  --  0.96   Anemia Panel: No results for input(s): VITAMINB12, FOLATE, FERRITIN, TIBC, IRON, RETICCTPCT in the last 72 hours. Sepsis Labs: No results for input(s): PROCALCITON, LATICACIDVEN in the last 168 hours.  Recent Results (from  the past 240 hour(s))  SARS CORONAVIRUS 2 (TAT 6-24 HRS) Nasopharyngeal Nasopharyngeal Swab     Status: None   Collection Time: 03/16/21  9:45 PM   Specimen: Nasopharyngeal Swab  Result Value Ref Range Status   SARS Coronavirus 2 NEGATIVE NEGATIVE Final    Comment: (NOTE) SARS-CoV-2 target nucleic acids are NOT DETECTED.  The SARS-CoV-2 RNA is generally detectable in upper and lower respiratory specimens during the acute phase of infection. Negative results do not preclude SARS-CoV-2 infection, do not rule out co-infections with other pathogens, and should not be used as the sole basis for treatment or other patient management decisions. Negative results must be combined with clinical observations, patient history, and epidemiological information. The expected result is Negative.  Fact Sheet for Patients: SugarRoll.be  Fact Sheet for Healthcare Providers: https://www.woods-mathews.com/  This test is not yet approved or cleared by the [place] FDA and  has been authorized for detection and/or diagnosis of  SARS-CoV-2 by FDA under an Emergency Use Authorization (EUA). This EUA will remain  in effect (meaning this test can be used) for the duration of the COVID-19 declaration under Se ction 564(b)(1) of the Act, 21 U.S.C. section 360bbb-3(b)(1), unless the authorization is terminated or revoked sooner.  Performed at [facility], [place] [address], [place], [place] [address]          Radiology Studies: DG Chest 2 View  Result Date: 03/16/2021 CLINICAL DATA:  Chest pain EXAM: CHEST - 2 VIEW COMPARISON:  July 13, 2018 FINDINGS: Small granuloma left lower lobe region. No edema or airspace opacity. Heart is upper normal in size with pulmonary vascularity normal. No adenopathy. No pneumothorax. No bone lesions. Focal calcification in right carotid artery. Colonic interposition between right hemidiaphragm and liver noted incidentally. IMPRESSION: No edema or airspace opacity. Small granuloma left lower lobe. Stable cardiac silhouette. There is right carotid artery calcification. Electronically Signed   By: Lowella Grip III M.D.   On: 03/16/2021 19:35        Scheduled Meds: . aspirin EC  81 mg Oral Daily  . atorvastatin  80 mg Oral Daily  . pantoprazole (PROTONIX) IV  40 mg Intravenous QHS   Continuous Infusions: . heparin       LOS: 0 days    Time spent: 39 minutes spent on chart review, discussion with nursing staff, consultants, updating family and interview/physical exam; more than 50% of that time was spent in counseling and/or coordination of care.    Gwendloyn Forsee J [place] DO [facility] Available via Epic secure chat 7am-7pm After these hours, please refer to coverage provider listed on [email or web address] 03/17/2021, 10:42 AM

## 2021-03-18 ENCOUNTER — Encounter (HOSPITAL_COMMUNITY): Payer: Self-pay | Admitting: Cardiovascular Disease

## 2021-03-18 ENCOUNTER — Encounter (HOSPITAL_COMMUNITY)
Admission: EM | Disposition: A | Discharge status: Home / Self Care | Payer: Self-pay | Source: Home / Self Care | Attending: Internal Medicine

## 2021-03-18 DIAGNOSIS — K219 Gastro-esophageal reflux disease without esophagitis: Secondary | ICD-10-CM | POA: Diagnosis not present

## 2021-03-18 DIAGNOSIS — I2510 Atherosclerotic heart disease of native coronary artery without angina pectoris: Secondary | ICD-10-CM | POA: Diagnosis not present

## 2021-03-18 DIAGNOSIS — I2583 Coronary atherosclerosis due to lipid rich plaque: Secondary | ICD-10-CM | POA: Diagnosis not present

## 2021-03-18 DIAGNOSIS — R079 Chest pain, unspecified: Secondary | ICD-10-CM | POA: Diagnosis not present

## 2021-03-18 DIAGNOSIS — I10 Essential (primary) hypertension: Secondary | ICD-10-CM | POA: Diagnosis not present

## 2021-03-18 DIAGNOSIS — I25110 Atherosclerotic heart disease of native coronary artery with unstable angina pectoris: Secondary | ICD-10-CM | POA: Diagnosis present

## 2021-03-18 DIAGNOSIS — E7800 Pure hypercholesterolemia, unspecified: Secondary | ICD-10-CM | POA: Diagnosis not present

## 2021-03-18 HISTORY — PX: LEFT HEART CATH AND CORONARY ANGIOGRAPHY: CATH118249

## 2021-03-18 HISTORY — PX: CORONARY STENT INTERVENTION: CATH118234

## 2021-03-18 LAB — CBC
HCT: 36.6 % — ABNORMAL LOW (ref 39.0–52.0)
Hemoglobin: 11.5 g/dL — ABNORMAL LOW (ref 13.0–17.0)
MCH: 23.4 pg — ABNORMAL LOW (ref 26.0–34.0)
MCHC: 31.4 g/dL (ref 30.0–36.0)
MCV: 74.5 fL — ABNORMAL LOW (ref 80.0–100.0)
Platelets: 180 10*3/uL (ref 150–400)
RBC: 4.91 MIL/uL (ref 4.22–5.81)
RDW: 17.2 % — ABNORMAL HIGH (ref 11.5–15.5)
WBC: 5.9 10*3/uL (ref 4.0–10.5)
nRBC: 0 % (ref 0.0–0.2)

## 2021-03-18 LAB — LIPID PANEL
Cholesterol: 104 mg/dL (ref 0–200)
HDL: 37 mg/dL — ABNORMAL LOW (ref >40)
LDL Cholesterol: 57 mg/dL (ref 0–99)
Total CHOL/HDL Ratio: 2.8 RATIO
Triglycerides: 50 mg/dL (ref <150)
VLDL: 10 mg/dL (ref 0–40)

## 2021-03-18 LAB — BASIC METABOLIC PANEL
Anion gap: 6 (ref 5–15)
BUN: 17 mg/dL (ref 8–23)
CO2: 26 mmol/L (ref 22–32)
Calcium: 8.6 mg/dL — ABNORMAL LOW (ref 8.9–10.3)
Chloride: 106 mmol/L (ref 98–111)
Creatinine, Ser: 1.01 mg/dL (ref 0.61–1.24)
GFR, Estimated: >60 mL/min (ref >60)
Glucose, Bld: 108 mg/dL — ABNORMAL HIGH (ref 70–99)
Potassium: 4 mmol/L (ref 3.5–5.1)
Sodium: 138 mmol/L (ref 135–145)

## 2021-03-18 LAB — POCT ACTIVATED CLOTTING TIME: Activated Clotting Time: 321 seconds

## 2021-03-18 LAB — T3, FREE: T3, Free: 2.3 pg/mL (ref 2.0–4.4)

## 2021-03-18 LAB — HEPARIN LEVEL (UNFRACTIONATED): Heparin Unfractionated: 0.79 IU/mL — ABNORMAL HIGH (ref 0.30–0.70)

## 2021-03-18 LAB — APTT: aPTT: 60 seconds — ABNORMAL HIGH (ref 24–36)

## 2021-03-18 SURGERY — LEFT HEART CATH AND CORONARY ANGIOGRAPHY
Anesthesia: LOCAL

## 2021-03-18 MED ORDER — VERAPAMIL HCL 2.5 MG/ML IV SOLN
INTRAVENOUS | Status: AC
  Filled 2021-03-18: qty 2

## 2021-03-18 MED ORDER — MIDAZOLAM HCL 2 MG/2ML IJ SOLN
INTRAMUSCULAR | Status: AC
  Filled 2021-03-18: qty 2

## 2021-03-18 MED ORDER — CLOPIDOGREL BISULFATE 300 MG PO TABS
ORAL_TABLET | ORAL | Status: AC
  Filled 2021-03-18: qty 1

## 2021-03-18 MED ORDER — HEPARIN (PORCINE) IN NACL 1000-0.9 UT/500ML-% IV SOLN
INTRAVENOUS | Status: AC
  Filled 2021-03-18: qty 1000

## 2021-03-18 MED ORDER — SODIUM CHLORIDE 0.9% FLUSH
3.0000 mL | Freq: Two times a day (BID) | INTRAVENOUS | Status: DC
  Administered 2021-03-18 – 2021-03-19 (×2): 3 mL via INTRAVENOUS

## 2021-03-18 MED ORDER — CLOPIDOGREL BISULFATE 75 MG PO TABS
75.0000 mg | ORAL_TABLET | Freq: Every day | ORAL | Status: DC
  Administered 2021-03-19: 75 mg via ORAL
  Filled 2021-03-18: qty 1

## 2021-03-18 MED ORDER — ASPIRIN 81 MG PO CHEW: 81.0000 mg | CHEWABLE_TABLET | Freq: Every day | ORAL | Status: DC

## 2021-03-18 MED ORDER — RIVAROXABAN 20 MG PO TABS: 20.0000 mg | ORAL_TABLET | Freq: Every day | ORAL | Status: DC

## 2021-03-18 MED ORDER — CLOPIDOGREL BISULFATE 300 MG PO TABS
ORAL_TABLET | ORAL | Status: DC | PRN
  Administered 2021-03-18: 600 mg via ORAL

## 2021-03-18 MED ORDER — LABETALOL HCL 5 MG/ML IV SOLN: 10.0000 mg | INTRAVENOUS | Status: AC | PRN

## 2021-03-18 MED ORDER — SODIUM CHLORIDE 0.9 % IV SOLN: 250.0000 mL | INTRAVENOUS | Status: DC | PRN

## 2021-03-18 MED ORDER — HYDRALAZINE HCL 20 MG/ML IJ SOLN: 10.0000 mg | INTRAMUSCULAR | Status: AC | PRN

## 2021-03-18 MED ORDER — FAMOTIDINE IN NACL 20-0.9 MG/50ML-% IV SOLN
INTRAVENOUS | Status: AC
  Filled 2021-03-18: qty 50

## 2021-03-18 MED ORDER — SODIUM CHLORIDE 0.9% FLUSH: 3.0000 mL | INTRAVENOUS | Status: DC | PRN

## 2021-03-18 MED ORDER — FENTANYL CITRATE (PF) 100 MCG/2ML IJ SOLN
INTRAMUSCULAR | Status: AC
  Filled 2021-03-18: qty 2

## 2021-03-18 MED ORDER — HEPARIN (PORCINE) IN NACL 1000-0.9 UT/500ML-% IV SOLN
INTRAVENOUS | Status: DC | PRN
  Administered 2021-03-18 (×2): 500 mL

## 2021-03-18 MED ORDER — LIDOCAINE HCL (PF) 1 % IJ SOLN
INTRAMUSCULAR | Status: DC | PRN
  Administered 2021-03-18: 4 mL via SUBCUTANEOUS

## 2021-03-18 MED ORDER — IOHEXOL 350 MG/ML SOLN
INTRAVENOUS | Status: DC | PRN
  Administered 2021-03-18: 110 mL via INTRA_ARTERIAL

## 2021-03-18 MED ORDER — HEPARIN SODIUM (PORCINE) 1000 UNIT/ML IJ SOLN
INTRAMUSCULAR | Status: DC | PRN
  Administered 2021-03-18: 6000 [IU] via INTRAVENOUS
  Administered 2021-03-18: 5000 [IU] via INTRAVENOUS

## 2021-03-18 MED ORDER — LIDOCAINE HCL (PF) 1 % IJ SOLN
INTRAMUSCULAR | Status: AC
  Filled 2021-03-18: qty 30

## 2021-03-18 MED ORDER — CLOPIDOGREL BISULFATE 75 MG PO TABS: 75.0000 mg | ORAL_TABLET | Freq: Every day | ORAL | 0 refills | Status: DC

## 2021-03-18 MED ORDER — HEPARIN SODIUM (PORCINE) 1000 UNIT/ML IJ SOLN
INTRAMUSCULAR | Status: AC
  Filled 2021-03-18: qty 1

## 2021-03-18 MED ORDER — FENTANYL CITRATE (PF) 100 MCG/2ML IJ SOLN
INTRAMUSCULAR | Status: DC | PRN
  Administered 2021-03-18 (×2): 25 ug via INTRAVENOUS

## 2021-03-18 MED ORDER — VERAPAMIL HCL 2.5 MG/ML IV SOLN
INTRAVENOUS | Status: DC | PRN
  Administered 2021-03-18: 10 mL via INTRA_ARTERIAL

## 2021-03-18 MED ORDER — FAMOTIDINE IN NACL 20-0.9 MG/50ML-% IV SOLN
INTRAVENOUS | Status: AC | PRN
  Administered 2021-03-18: 20 mg via INTRAVENOUS

## 2021-03-18 MED ORDER — SODIUM CHLORIDE 0.9 % IV SOLN: INTRAVENOUS | Status: AC

## 2021-03-18 MED ORDER — MIDAZOLAM HCL 2 MG/2ML IJ SOLN
INTRAMUSCULAR | Status: DC | PRN
  Administered 2021-03-18 (×2): 1 mg via INTRAVENOUS

## 2021-03-18 MED ORDER — ASPIRIN 81 MG PO TBEC: 81.0000 mg | DELAYED_RELEASE_TABLET | Freq: Every day | ORAL | 0 refills | Status: AC

## 2021-03-18 SURGICAL SUPPLY — 19 items
BALLN SAPPHIRE 2.0X12 (BALLOONS) ×2
BALLN SAPPHIRE ~~LOC~~ 3.0X8 (BALLOONS) ×1 IMPLANT
BALLOON SAPPHIRE 2.0X12 (BALLOONS) IMPLANT
CATH 5FR JL3.5 JR4 ANG PIG MP (CATHETERS) ×1 IMPLANT
CATH VISTA GUIDE 6FR XB3 (CATHETERS) ×1 IMPLANT
DEVICE RAD COMP TR BAND LRG (VASCULAR PRODUCTS) ×1 IMPLANT
GLIDESHEATH SLEND SS 6F .021 (SHEATH) ×1 IMPLANT
GUIDEWIRE INQWIRE 1.5J.035X260 (WIRE) IMPLANT
INQWIRE 1.5J .035X260CM (WIRE) ×2
KIT ENCORE 26 ADVANTAGE (KITS) ×1 IMPLANT
KIT HEART LEFT (KITS) ×2 IMPLANT
PACK CARDIAC CATHETERIZATION (CUSTOM PROCEDURE TRAY) ×2 IMPLANT
STENT SYNERGY XD 2.75X12 (Permanent Stent) IMPLANT
STENT SYNERGY XD 2.75X20 (Permanent Stent) IMPLANT
SYNERGY XD 2.75X12 (Permanent Stent) ×2 IMPLANT
SYNERGY XD 2.75X20 (Permanent Stent) IMPLANT
TRANSDUCER W/STOPCOCK (MISCELLANEOUS) ×2 IMPLANT
TUBING CIL FLEX 10 FLL-RA (TUBING) ×2 IMPLANT
WIRE COUGAR XT STRL 190CM (WIRE) ×1 IMPLANT

## 2021-03-18 NOTE — Discharge Instructions (Signed)
Call [facility] at [phone] if any bleeding, swelling or drainage at cath site.  May shower, no tub baths for 48 hours for groin sticks. No lifting over 5 pounds for 3 days.  No Driving for 3 days  Heart Healthy Diet  Begin Xarelto tomorrow 03/19/21

## 2021-03-18 NOTE — Progress Notes (Signed)
Off unit to cath lab.

## 2021-03-18 NOTE — Progress Notes (Addendum)
Progress Note  Patient Name: Jose Stone Date of Encounter: 03/18/2021  [facility] Cardiologist: [name], MD   Subjective   Some chest pain during the night but mild.  No SOB.  Had Rapid HR at 1619:38 yest.  Will review he felt a fluttering  Inpatient Medications    Scheduled Meds: . aspirin EC  81 mg Oral Daily  . atorvastatin  80 mg Oral Daily  . pantoprazole  40 mg Oral QHS  . sodium chloride flush  3 mL Intravenous Q12H   Continuous Infusions: . sodium chloride    . sodium chloride 1 mL/kg/hr (03/18/21 0526)  . heparin 1,400 Units/hr (03/18/21 0624)   PRN Meds: sodium chloride, acetaminophen **OR** acetaminophen, morphine injection, nitroGLYCERIN, ondansetron (ZOFRAN) IV, sodium chloride flush   Vital Signs    Vitals:   03/17/21 0939 03/17/21 2010 03/17/21 2030 03/18/21 0614  BP: 137/84 (!) 150/79  140/73  Pulse: (!) 47 (!) 52 (!) 57 (!) 47  Resp: 18 (!) 198 18 20  Temp: 97.9 F (36.6 C) 98.3 F (36.8 C)  98.3 F (36.8 C)  TempSrc: Oral Oral  Oral  SpO2: 98% 99% 97% 98%  Weight:    101.6 kg  Height:        Intake/Output Summary (Last 24 hours) at 03/18/2021 0752 Last data filed at 03/18/2021 [id number] Gross per 24 hour  Intake 871.22 ml  Output --  Net 871.22 ml   Last 3 Weights 03/18/2021 03/16/2021 01/21/2021  Weight (lbs) 224 lb 228 lb 8 oz 238 lb 3.2 oz  Weight (kg) 101.606 kg 103.647 kg 108.047 kg      Telemetry    SB in 50s to 40s and at 1619:38sec had about 10 sec PAF with coupled PVCs - Personally Reviewed  ECG    No new - Personally Reviewed  Physical Exam   GEN: No acute distress.   Neck: No JVD Cardiac: RRR, soft murmur, no rubs, or gallops.  Respiratory: Clear to auscultation bilaterally. GI: Soft, nontender, non-distended  MS: No edema; No deformity. Neuro:  Nonfocal  Psych: Normal affect   Labs    High Sensitivity Troponin:   Recent Labs  Lab 03/16/21 1833 03/16/21 2033 03/17/21 0010 03/17/21 0229   TROPONINIHS 7 7 9 10       Chemistry Recent Labs  Lab 03/16/21 1833 03/17/21 0010 03/18/21 0605  NA 139 139 138  K 4.1 3.8 4.0  CL 106 105 106  CO2 26 28 26   GLUCOSE 92 97 108*  BUN 21 18 17   CREATININE 1.09 1.03 1.01  CALCIUM 8.8* 8.7* 8.6*  PROT 6.0* 5.8*  --   ALBUMIN 3.5 3.4*  --   AST 28 24  --   ALT 25 23  --   ALKPHOS 68 66  --   BILITOT 0.9 0.8  --   GFRNONAA >60 >60 >60  ANIONGAP 7 6 6      Hematology Recent Labs  Lab 03/16/21 1833 03/17/21 0010  WBC 8.9 6.5  RBC 4.91 4.87  HGB 11.7* 11.5*  HCT 37.2* 36.6*  MCV 75.8* 75.2*  MCH 23.8* 23.6*  MCHC 31.5 31.4  RDW 17.2* 17.1*  PLT 233 233    BNPNo results for input(s): BNP, PROBNP in the last 168 hours.   DDimer No results for input(s): DDIMER in the last 168 hours.   Radiology    DG Chest 2 View  Result Date: 03/16/2021 CLINICAL DATA:  Chest pain EXAM: CHEST - 2 VIEW  COMPARISON:  July 13, 2018 FINDINGS: Small granuloma left lower lobe region. No edema or airspace opacity. Heart is upper normal in size with pulmonary vascularity normal. No adenopathy. No pneumothorax. No bone lesions. Focal calcification in right carotid artery. Colonic interposition between right hemidiaphragm and liver noted incidentally. IMPRESSION: No edema or airspace opacity. Small granuloma left lower lobe. Stable cardiac silhouette. There is right carotid artery calcification. Electronically Signed   By: Lowella Grip III M.D.   On: 03/16/2021 19:35   ECHOCARDIOGRAM COMPLETE  Result Date: 03/17/2021    ECHOCARDIOGRAM REPORT   Patient Name:   Jose Stone Date of Exam: 03/17/2021 Medical Rec #:  [id number]      Height:       73.0 in Accession #:    [id number]     Weight:       228.5 lb Date of Birth:  [birth year]       BSA:          2.277 m Patient Age:    75 years       BP:           137/84 mmHg Patient Gender: M              HR:           47 bpm. Exam Location:  Inpatient Procedure: 2D Echo, Cardiac Doppler and Color Doppler  Indications:    Chest Pain R07.9  History:        Patient has prior history of Echocardiogram examinations, most                 recent 06/28/2020. CAD, Arrythmias:Atrial Fibrillation, RBBB and                 Atrial Flutter; Risk Factors:Hypertension and Dyslipidemia. OSA                 on CPAP, Dilated aortic root, VSD (ventricular septal defect).  Sonographer:    Alvino Chapel RCS Referring Phys: [phone] ANASTASSIA DOUTOVA  Sonographer Comments: Subcostal images were difficult to obatin. Could NOT obtain IVC. IMPRESSIONS  1. 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 Left ventricular ejection fraction, by estimation, is 55 to 60%. The left ventricle has normal function. The left ventricle has no regional wall motion abnormalities. There is mild left ventricular hypertrophy. Left ventricular diastolic parameters were normal.  2. RV-RA gradient 36 mmHg suggesting at least mildly increased RVSP. Right ventricular systolic function is normal. The right ventricular size is normal.  3. Left atrial size was severely dilated.  4. The mitral valve is grossly normal. Trivial mitral valve regurgitation.  5. The aortic valve is tricuspid. Aortic valve regurgitation is trivial. Mild aortic valve sclerosis is present, with no evidence of aortic valve stenosis.  6. Aortic dilatation noted. There is moderate dilatation of the aortic root, measuring 44 mm.  7. Unable to estimate CVP. FINDINGS  Left Ventricle: Permembranous VSD evident with left to right shunt by color Doppler. Left ventricular ejection fraction, by estimation, is 55 to 60%. The left ventricle has normal function. The left ventricle has no regional wall motion abnormalities. The left ventricular internal cavity size was normal in size. There is mild left ventricular hypertrophy. Left ventricular diastolic parameters were normal. Right Ventricle: RV-RA gradient 36 mmHg suggesting at least mildly increased RVSP. The right ventricular size is  normal. No increase in right ventricular wall thickness. Right ventricular systolic function is normal. Left Atrium: Left atrial  size was severely dilated. Right Atrium: Right atrial size was normal in size. Pericardium: There is no evidence of pericardial effusion. Mitral Valve: The mitral valve is grossly normal. Trivial mitral valve regurgitation. Tricuspid Valve: The tricuspid valve is grossly normal. Tricuspid valve regurgitation is mild. Aortic Valve: The aortic valve is tricuspid. There is mild aortic valve annular calcification. Aortic valve regurgitation is trivial. Mild aortic valve sclerosis is present, with no evidence of aortic valve stenosis. Pulmonic Valve: The pulmonic valve was grossly normal. Pulmonic valve regurgitation is trivial. Aorta: Aortic dilatation noted. There is moderate dilatation of the aortic root, measuring 44 mm. Venous: Unable to estimate CVP. The inferior vena cava was not well visualized. IAS/Shunts: No atrial level shunt detected by color flow Doppler.  LEFT VENTRICLE PLAX 2D LVIDd:         4.60 cm  Diastology LVIDs:         3.40 cm  LV e' medial:    8.27 cm/s LV PW:         1.00 cm  LV E/e' medial:  10.3 LV IVS:        1.10 cm  LV e' lateral:   11.60 cm/s LVOT diam:     2.20 cm  LV E/e' lateral: 7.3 LV SV:         93 LV SV Index:   41 LVOT Area:     3.80 cm  RIGHT VENTRICLE RV S prime:     12.70 cm/s TAPSE (M-mode): 2.5 cm LEFT ATRIUM              Index       RIGHT ATRIUM           Index LA diam:        4.50 cm  1.98 cm/m  RA Area:     21.40 cm LA Vol (A2C):   133.0 ml 58.41 ml/m RA Volume:   63.30 ml  27.80 ml/m LA Vol (A4C):   118.0 ml 51.82 ml/m LA Biplane Vol: 126.0 ml 55.34 ml/m  AORTIC VALVE LVOT Vmax:   108.00 cm/s LVOT Vmean:  72.700 cm/s LVOT VTI:    0.245 m  AORTA Ao Root diam: 4.40 cm MITRAL VALVE               TRICUSPID VALVE MV Area (PHT): 3.53 cm    TR Peak grad:   35.8 mmHg MV Decel Time: 215 msec    TR Vmax:        299.00 cm/s MV E velocity: 85.00 cm/s  MV A velocity: 67.90 cm/s  SHUNTS MV E/A ratio:  1.25        Systemic VTI:  0.24 m                            Systemic Diam: 2.20 cm Rozann Lesches MD Electronically signed by Rozann Lesches MD Signature Date/Time: 03/17/2021/2:18:43 PM    Final     Cardiac Studies   Echo 03/17/21 IMPRESSIONS    1. 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  Left ventricular ejection fraction, by estimation, is 55 to 60%. The left  ventricle has normal function. The left ventricle has no regional wall  motion abnormalities. There is mild  left ventricular hypertrophy. Left ventricular diastolic parameters were  normal.  2. RV-RA gradient 36 mmHg suggesting at least mildly increased RVSP.  Right ventricular systolic function is normal. The right ventricular size  is normal.  3. Left atrial size was severely dilated.  4. The mitral valve is grossly normal. Trivial mitral valve  regurgitation.  5. The aortic valve is tricuspid. Aortic valve regurgitation is trivial.  Mild aortic valve sclerosis is present, with no evidence of aortic valve  stenosis.  6. Aortic dilatation noted. There is moderate dilatation of the aortic  root, measuring 44 mm.  7. Unable to estimate CVP.   FINDINGS  Left Ventricle: Permembranous VSD evident with left to right shunt by  color Doppler. Left ventricular ejection fraction, by estimation, is 55 to  60%. The left ventricle has normal function. The left ventricle has no  regional wall motion abnormalities.  The left ventricular internal cavity size was normal in size. There is  mild left ventricular hypertrophy. Left ventricular diastolic parameters  were normal.   Right Ventricle: RV-RA gradient 36 mmHg suggesting at least mildly  increased RVSP. The right ventricular size is normal. No increase in right  ventricular wall thickness. Right ventricular systolic function is normal.   Left Atrium: Left atrial size was severely dilated.    Right Atrium: Right atrial size was normal in size.   Pericardium: There is no evidence of pericardial effusion.   Mitral Valve: The mitral valve is grossly normal. Trivial mitral valve  regurgitation.   Tricuspid Valve: The tricuspid valve is grossly normal. Tricuspid valve  regurgitation is mild.   Aortic Valve: The aortic valve is tricuspid. There is mild aortic valve  annular calcification. Aortic valve regurgitation is trivial. Mild aortic  valve sclerosis is present, with no evidence of aortic valve stenosis.   Pulmonic Valve: The pulmonic valve was grossly normal. Pulmonic valve  regurgitation is trivial.   Aorta: Aortic dilatation noted. There is moderate dilatation of the aortic  root, measuring 44 mm.   Venous: Unable to estimate CVP. The inferior vena cava was not well  visualized.   IAS/Shunts: No atrial level shunt detected by color flow Doppler.      Patient Profile     75 y.o. male with a hx of CADs/p PCI,aortic valve regurg, atrial fibrillations/p ablation on xarelto, VSD, right bundle branch blocknow admitted with chest pain and EKG changes.   Assessment & Plan    Chest pain with low troponin, EKG with new inf. T wave inversions. Along with continued pain plan for cardiac cath today.  xarelto on hold last dose 5/14 pm, IV heparin per pharmacy.  Still with chest pain at times. --added ASA 81 mg daily --no BB due to bradycardia --labs stable for cath.  --echo with EF 55-60% no RWMA  CAD with hx of PCI to OM in 2015 and known LAD disease of 60%. Last nuc 2018 with some ischemia, plan for medical therapy though chest pain had resolved.  Marland Kitchen   PAF,  Remote ablation, SB on exam.  xarelto held for cath, on IV heparin  About 10 sec of PAF and PVCs on tele he did feel.   SB in the 40s usually in the 50s on no rate slowing meds. Rare 39 on tele.  HLD LDL goal < 70 on lipitor 80 daily  Today LDL 57, HDL 37   HTN controlled on diet  Aortic dilatation  noted on echo. 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

## 2021-03-18 NOTE — Interval H&P Note (Signed)
History and Physical Interval Note:  03/18/2021 9:36 AM  Jose Stone  has presented today for surgery, with the diagnosis of unstable angina.  The various methods of treatment have been discussed with the patient and family. After consideration of risks, benefits and other options for treatment, the patient has consented to  Procedure(s): LEFT HEART CATH AND CORONARY ANGIOGRAPHY (N/A) as a surgical intervention.  The patient's history has been reviewed, patient examined, no change in status, stable for surgery.  I have reviewed the patient's chart and labs.  Questions were answered to the patient's satisfaction.   Cath Lab Visit (complete for each Cath Lab visit)  Clinical Evaluation Leading to the Procedure:   ACS: No.  Non-ACS:    Anginal Classification: CCS III  Anti-ischemic medical therapy: No Therapy  Non-Invasive Test Results: No non-invasive testing performed  Prior CABG: No previous CABG        Lauree Chandler

## 2021-03-18 NOTE — Progress Notes (Signed)
[place] for Heparin Indication: atrial fibrillation  Allergies  Allergen Reactions  . Ciprofloxacin     Other reaction(s): bulging aorta  . Codeine Nausea Only    Can tolerate with benadryl  . Diltiazem Cd [Diltiazem Hcl]     Other reaction(s): Bradycardia  . Mupirocin Itching  . Neosporin [Bacitracin-Polymyxin B]     Other reaction(s): rash  . Other     Other reaction(s): rash  . Tramadol Hcl     Other reaction(s): rash  . Hydrocodone Rash    Can tolerate with benadryl  . Neosporin [Neomycin-Bacitracin Zn-Polymyx] Rash  . Oxycodone Rash    Can tolerate with benadryl    Patient Measurements: Height: 6\' 1"  (185.4 cm) Weight: 101.6 kg (224 lb) IBW/kg (Calculated) : 79.9 Heparin Dosing Weight: 101 kg  Vital Signs: Temp: 98.3 F (36.8 C) (05/16 0614) Temp Source: Oral (05/16 0614) BP: 140/73 (05/16 0614) Pulse Rate: 47 (05/16 0614)  Labs: Recent Labs    03/16/21 1833 03/16/21 2033 03/17/21 0010 03/17/21 0229 03/18/21 0605  HGB 11.7*  --  11.5*  --  11.5*  HCT 37.2*  --  36.6*  --  36.6*  PLT 233  --  233  --  180  APTT  --   --   --   --  60*  HEPARINUNFRC  --   --   --   --  0.79*  CREATININE 1.09  --  1.03  --  1.01  TROPONINIHS 7 7 9 10   --     Estimated Creatinine Clearance: 81.6 mL/min (by C-G formula based on SCr of 1.01 mg/dL).   Medical History: Past Medical History:  Diagnosis Date  . Aortic root enlargement ([place])   . CAD (coronary artery disease)   . Complication of anesthesia    Hypotensive with general anesth.  . GERD (gastroesophageal reflux disease)   . Hyperlipidemia   . Hypertension   . Melanoma ([place])   . Persistent atrial fibrillation ([place])    Afib,Aflutter  . Typical atrial flutter ([place])   . 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 Ventricular septal defect     Assessment: 37 yoM admitted for chest pain, hx CAD with remote PCI, VSD, and RBB. History of  PAF s/p ablation, on Xarelto prior to admission. Pharmacy consulted to dose heparin for afib given plan for [facility] 5/16 to further evaluate chest pain.   Last dose Xarelto prior to admission reported 5/13 at 1800, did receive dose in ED 5/14 at 2330.   CBC stable, no s/sx bleeding reported.  APTT below goal this AM.  Heparin level still falsely elevated from recent Xarelto.  Goal of Therapy:  Heparin level 0.3-0.7 units/ml aPTT 66-102 seconds Monitor platelets by anticoagulation protocol: Yes   Plan:  Increase IV heparin to 1550 units/hr. Repeat heparin level in 8 hrs. F/u plans to potentially resume Xarelto after cath today?  [place] Crane, Roylene Reason, BCCP Clinical Pharmacist  03/18/2021 8:01 AM   [facility] pharmacy phone numbers are listed on [email or web address]

## 2021-03-18 NOTE — Progress Notes (Signed)
RT note. Pt. Will place self on home CPAP when ready for bed

## 2021-03-18 NOTE — Progress Notes (Signed)
Tried during day shift to get TR band removed. TR band continued to bleed every time air was released, thus only able to get TR band to 5cc. Cardiology was paged and it was advised to continue to try and release air from TR band. Night shift RN was advised and she will follow up with cardiology concerning TR band if continuing to bleed.

## 2021-03-18 NOTE — Progress Notes (Signed)
Called by RN for atrial fib.  Ordered EKG, on review of tele and EKGs no atrial fib except brief episode yesterday pm for about 10 sec.  Reviewed with Dr. Oval Linsey and it is ok for discharge today.

## 2021-03-18 NOTE — Progress Notes (Signed)
Patient resting comfortably on home CPAP unit. No assistance from RT at this time.

## 2021-03-18 NOTE — Discharge Summary (Addendum)
Physician Discharge Summary  KLYDE WIESEL [id number] DOB: 24-Jan-1946 DOA: 03/16/2021  PCP: Kathyrn Lass, MD  Admit date: 03/16/2021 Discharge date: 03/19/2021  Admitted From: Home Disposition: Home  Recommendations for Outpatient Follow-up:  1. Follow up with PCP in 1-2 weeks 2. Follow-up with cardiology as scheduled on 04/04/2021 3. Continue dual antiplatelet therapy with aspirin and Plavix x1 month followed by Plavix alone 4. May resume Xarelto on 03/19/2021  Home Health: No Equipment/Devices: None  Discharge Condition: Stable CODE STATUS: Full code Diet recommendation: Heart healthy diet  History of present illness:  Jose Stone is a 75 year old male with past medical history significant for CAD s/p PCI, VSD, right bundle branch block, paroxysmal atrial fibrillation on Xarelto, HLD, GERD, HTN, aortic valve repair who presents to [facility] ED on 5/14 by EMS with chief complaint of substernal chest pain.  Patient reports onset while seated working in his garage.  Patient also complained of dizziness and diaphoresis at the time.  Patient took 2 sublingual nitroglycerin with no relief and was given nitroglycerin and 324 mg of aspirin by EMS prior to arrival.  The pain is reported as substernal with no radiation, intermittent with no known exacerbating or alleviating factors.  In the ED, temperature 98.6 F, HR 56, RR 14, BP 121/61, SPO2 96% on room air.  Sodium 139, potassium 4.1, chloride 106, CO2 26, glucose 92, BUN 21, creatinine 1.09.  Troponin 7>7. WBC 8.9, Hgb 11.7, Plt 233.  Chest x-ray with no edema or airspace opacity, small granuloma left lower lobe.  EKG with sinus bradycardia, rate 49, QTc 373, T wave inversion lead III, no ST elevation/depression or other T wave inversion.  Cardiology was consulted.  Hospitalist service consulted for further evaluation and management.  Hospital course:  Chest pain Hx CAD s/p PCI Patient presenting to ED after acute onset chest pain at  rest with associated diaphoresis, weakness and dizziness.  No relief with nitroglycerin, but improved with IV morphine. Troponin 7>7>9>10, within normal limits.  Chest x-ray with no acute findings.  EKG notable for sinus bradycardia with a rate of 49, normal QTC with T wave inversion in lead III.  Patient with remote heart catheterization with 60-70% mLAD and 80% OM occlusion s/p PCI OM.  TTE with LVEF 55-30%, LV normal function, no regional wall motion abnormalities, LA severely dilated which is new in comparison to previous, trivial MR, aortic root dilation 44 mm with trivial aortic regurgitation.  Cardiology was consulted and followed during hospital course.  Patient was placed on a heparin drip.  Underwent left heart catheterization with PCI stent to obtuse marginal.  Patient will discharge on dual antiplatelet therapy with aspirin and Plavix Thomson 30 days followed by Plavix alone.  May resume Xarelto on 03/19/2021.  Outpatient follow-up with cardiology has been scheduled on 04/04/2021.  Continue atorvastatin 80 mg p.o. daily.  Paroxysmal atrial fibrillation History of remote ablation.  Currently in sinus bradycardia.  Not on rate controlling medications due to bradycardia.  Patient did have 110-second episode of atrial fibrillation during hospitalization which has spontaneously resolved.  Resume Xarelto on 03/19/2021 per cardiology.  Essential hypertension: Continue valsartan  Subclinical hypothyroidism TSH elevated 5.176 with free T4 0.96, within normal limits.    HLD Lipid panel with total cholesterol 104, HDL 37, LDL 57.  Atorvastatin 80 mg p.o. daily  GERD: Continue PPI  OSA: Nocturnal CPAP  Discharge Diagnoses:  Active Problems:   Essential hypertension   Hyperlipidemia   GERD (gastroesophageal reflux disease)  Coronary artery disease due to lipid rich plaque   OSA on CPAP   Symptomatic bradycardia   PAF (paroxysmal atrial fibrillation) (HCC)   Coronary artery disease  involving native coronary artery of native heart with unstable angina pectoris [facility])    Discharge Instructions  Discharge Instructions    Amb Referral to Cardiac Rehabilitation   Complete by: As directed    Diagnosis: Coronary Stents   After initial evaluation and assessments completed: Virtual Based Care may be provided alone or in conjunction with Phase 2 Cardiac Rehab based on patient barriers.: Yes   Call MD for:  difficulty breathing, headache or visual disturbances   Complete by: As directed    Call MD for:  extreme fatigue   Complete by: As directed    Call MD for:  persistant dizziness or light-headedness   Complete by: As directed    Call MD for:  persistant nausea and vomiting   Complete by: As directed    Call MD for:  redness, tenderness, or signs of infection (pain, swelling, redness, odor or green/yellow discharge around incision site)   Complete by: As directed    Call MD for:  severe uncontrolled pain   Complete by: As directed    Call MD for:  temperature >100.4   Complete by: As directed    Diet - low sodium heart healthy   Complete by: As directed    Increase activity slowly   Complete by: As directed    Increase activity slowly   Complete by: As directed      Allergies as of 03/19/2021      Reactions   Ciprofloxacin    Other reaction(s): bulging aorta   Codeine Nausea Only   Can tolerate with benadryl   Diltiazem Cd [diltiazem Hcl]    Other reaction(s): Bradycardia   Mupirocin Itching   Neosporin [bacitracin-polymyxin B]    Other reaction(s): rash   Other    Other reaction(s): rash   Tramadol Hcl    Other reaction(s): rash   Hydrocodone Rash   Can tolerate with benadryl   Neosporin [neomycin-bacitracin Zn-polymyx] Rash   Oxycodone Rash   Can tolerate with benadryl      Medication List    TAKE these medications   acetaminophen 500 MG tablet Commonly known as: TYLENOL Take 1,000 mg by mouth every 6 (six) hours as needed for mild pain or  headache.   alendronate 70 MG tablet Commonly known as: FOSAMAX Take 70 mg by mouth every Wednesday. Take with a full glass of water on an empty stomach.   allopurinol 100 MG tablet Commonly known as: ZYLOPRIM Take 200 mg by mouth daily.   aspirin 81 MG EC tablet Take 1 tablet (81 mg total) by mouth daily. Swallow whole.   atorvastatin 80 MG tablet Commonly known as: LIPITOR TAKE 1 TABLET BY MOUTH DAILY AT 6 PM What changed: when to take this   clopidogrel 75 MG tablet Commonly known as: PLAVIX Take 1 tablet (75 mg total) by mouth daily with breakfast.   Colcrys 0.6 MG tablet Generic drug: colchicine Take 0.6 mg by mouth daily as needed (FOR GOUT).   DRY EYE OMEGA BENEFITS/VIT D-3 PO Take 1 capsule by mouth 3 (three) times daily.   ketotifen 0.025 % ophthalmic solution Commonly known as: ZADITOR Place 1 drop into both eyes as needed for allergies.   loratadine 10 MG tablet Commonly known as: CLARITIN Take 10 mg by mouth daily as needed for allergies.   multivitamin  with minerals Tabs tablet Take 1 tablet by mouth daily. Centrum Silver   nitroGLYCERIN 0.4 MG SL tablet Commonly known as: NITROSTAT Place 1 tablet (0.4 mg total) under the tongue every 5 (five) minutes as needed for chest pain. Max 3 doses.   omeprazole 40 MG capsule Commonly known as: PRILOSEC Take 40 mg by mouth daily before breakfast. 30-60 minutes prior to breakfast.   Retaine MGD 0.5-0.5 % Emul Generic drug: Light Mineral Oil-Mineral Oil Place 1 drop into both eyes in the morning, at noon, and at bedtime.   sildenafil 20 MG tablet Commonly known as: REVATIO Take 1 tablet by mouth as needed for erectile dysfunction.   testosterone cypionate 200 MG/ML injection Commonly known as: DEPOTESTOSTERONE CYPIONATE Inject 200 mg into the muscle See admin instructions. Every other week   valsartan 160 MG tablet Commonly known as: DIOVAN TAKE 1 TABLET BY MOUTH EVERY DAY What changed:   how much to  take  when to take this   Xarelto 20 MG Tabs tablet Generic drug: rivaroxaban TAKE 1 TABLET BY MOUTH EVERY DAY WITH SUPPER What changed: See the new instructions.       Follow-up Information    [name], MD Follow up on 04/04/2021.   Specialty: Cardiology Why: with Dr. Lysbeth Penner nurse practitioner Denyse Amass at 11:15 AM  Contact information: [place] [address] [phone]        Kathyrn Lass, MD. Schedule an appointment as soon as possible for a visit in 1 week(s).   Specialty: Family Medicine Contact information: [place] [address] [phone]              Allergies  Allergen Reactions  . Ciprofloxacin     Other reaction(s): bulging aorta  . Codeine Nausea Only    Can tolerate with benadryl  . Diltiazem Cd [Diltiazem Hcl]     Other reaction(s): Bradycardia  . Mupirocin Itching  . Neosporin [Bacitracin-Polymyxin B]     Other reaction(s): rash  . Other     Other reaction(s): rash  . Tramadol Hcl     Other reaction(s): rash  . Hydrocodone Rash    Can tolerate with benadryl  . Neosporin [Neomycin-Bacitracin Zn-Polymyx] Rash  . Oxycodone Rash    Can tolerate with benadryl    Consultations:  Cardiology   Procedures/Studies: DG Chest 2 View  Result Date: 03/16/2021 CLINICAL DATA:  Chest pain EXAM: CHEST - 2 VIEW COMPARISON:  July 13, 2018 FINDINGS: Small granuloma left lower lobe region. No edema or airspace opacity. Heart is upper normal in size with pulmonary vascularity normal. No adenopathy. No pneumothorax. No bone lesions. Focal calcification in right carotid artery. Colonic interposition between right hemidiaphragm and liver noted incidentally. IMPRESSION: No edema or airspace opacity. Small granuloma left lower lobe. Stable cardiac silhouette. There is right carotid artery calcification. Electronically Signed   By: Lowella Grip III M.D.   On: 03/16/2021 19:35   CARDIAC  CATHETERIZATION  Result Date: 03/18/2021  Prox RCA lesion is 60% stenosed.  1st Mrg-1 lesion is 95% stenosed.  1st Mrg-2 lesion is 10% stenosed.  A drug-eluting stent was successfully placed using a SYNERGY XD 2.75X12.  Post intervention, there is a 0% residual stenosis.  LPAV lesion is 60% stenosed.  Prox Cx to Mid Cx lesion is 50% stenosed.  Prox LAD to Mid LAD lesion is 60% stenosed.  Mid LAD lesion is 60% stenosed.  1. Moderate disease in the mid LAD that does  not appear to be changed from last cath. These lesions do not appear to be flow limiting 2. Severe stenosis proximal segment of the first obtuse marginal branch just proximal to the old stent. Moderate mid and distal Circumflex stenosis. 3. Moderate disease in the small non-dominant RCA 4. Successful PTCA/DES x 1 obtuse marginal branch. Recommendations: He will need DAPT with ASA and Plavix for one month then would stop ASA and continue Plavix along with the Xarelto.   ECHOCARDIOGRAM COMPLETE  Result Date: 03/17/2021    ECHOCARDIOGRAM REPORT   Patient Name:   ADONUS USELMAN Date of Exam: 03/17/2021 Medical Rec #:  [id number]      Height:       73.0 in Accession #:    [id number]     Weight:       228.5 lb Date of Birth:  07-Jan-1946       BSA:          2.277 m Patient Age:    52 years       BP:           137/84 mmHg Patient Gender: M              HR:           47 bpm. Exam Location:  Inpatient Procedure: 2D Echo, Cardiac Doppler and Color Doppler Indications:    Chest Pain R07.9  History:        Patient has prior history of Echocardiogram examinations, most                 recent 06/28/2020. CAD, Arrythmias:Atrial Fibrillation, RBBB and                 Atrial Flutter; Risk Factors:Hypertension and Dyslipidemia. OSA                 on CPAP, Dilated aortic root, VSD (ventricular septal defect).  Sonographer:    Alvino Chapel RCS Referring Phys: [id number] ANASTASSIA DOUTOVA  Sonographer Comments: Subcostal images were difficult to obatin. Could NOT obtain  IVC. IMPRESSIONS  1. 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 Left ventricular ejection fraction, by estimation, is 55 to 60%. The left ventricle has normal function. The left ventricle has no regional wall motion abnormalities. There is mild left ventricular hypertrophy. Left ventricular diastolic parameters were normal.  2. RV-RA gradient 36 mmHg suggesting at least mildly increased RVSP. Right ventricular systolic function is normal. The right ventricular size is normal.  3. Left atrial size was severely dilated.  4. The mitral valve is grossly normal. Trivial mitral valve regurgitation.  5. The aortic valve is tricuspid. Aortic valve regurgitation is trivial. Mild aortic valve sclerosis is present, with no evidence of aortic valve stenosis.  6. Aortic dilatation noted. There is moderate dilatation of the aortic root, measuring 44 mm.  7. Unable to estimate CVP. FINDINGS  Left Ventricle: Permembranous VSD evident with left to right shunt by color Doppler. Left ventricular ejection fraction, by estimation, is 55 to 60%. The left ventricle has normal function. The left ventricle has no regional wall motion abnormalities. The left ventricular internal cavity size was normal in size. There is mild left ventricular hypertrophy. Left ventricular diastolic parameters were normal. Right Ventricle: RV-RA gradient 36 mmHg suggesting at least mildly increased RVSP. The right ventricular size is normal. No increase in right ventricular wall thickness. Right ventricular systolic function is normal. Left Atrium: Left atrial size was severely dilated. Right Atrium:  Right atrial size was normal in size. Pericardium: There is no evidence of pericardial effusion. Mitral Valve: The mitral valve is grossly normal. Trivial mitral valve regurgitation. Tricuspid Valve: The tricuspid valve is grossly normal. Tricuspid valve regurgitation is mild. Aortic Valve: The aortic valve is tricuspid. There is mild  aortic valve annular calcification. Aortic valve regurgitation is trivial. Mild aortic valve sclerosis is present, with no evidence of aortic valve stenosis. Pulmonic Valve: The pulmonic valve was grossly normal. Pulmonic valve regurgitation is trivial. Aorta: Aortic dilatation noted. There is moderate dilatation of the aortic root, measuring 44 mm. Venous: Unable to estimate CVP. The inferior vena cava was not well visualized. IAS/Shunts: No atrial level shunt detected by color flow Doppler.  LEFT VENTRICLE PLAX 2D LVIDd:         4.60 cm  Diastology LVIDs:         3.40 cm  LV e' medial:    8.27 cm/s LV PW:         1.00 cm  LV E/e' medial:  10.3 LV IVS:        1.10 cm  LV e' lateral:   11.60 cm/s LVOT diam:     2.20 cm  LV E/e' lateral: 7.3 LV SV:         93 LV SV Index:   41 LVOT Area:     3.80 cm  RIGHT VENTRICLE RV S prime:     12.70 cm/s TAPSE (M-mode): 2.5 cm LEFT ATRIUM              Index       RIGHT ATRIUM           Index LA diam:        4.50 cm  1.98 cm/m  RA Area:     21.40 cm LA Vol (A2C):   133.0 ml 58.41 ml/m RA Volume:   63.30 ml  27.80 ml/m LA Vol (A4C):   118.0 ml 51.82 ml/m LA Biplane Vol: 126.0 ml 55.34 ml/m  AORTIC VALVE LVOT Vmax:   108.00 cm/s LVOT Vmean:  72.700 cm/s LVOT VTI:    0.245 m  AORTA Ao Root diam: 4.40 cm MITRAL VALVE               TRICUSPID VALVE MV Area (PHT): 3.53 cm    TR Peak grad:   35.8 mmHg MV Decel Time: 215 msec    TR Vmax:        299.00 cm/s MV E velocity: 85.00 cm/s MV A velocity: 67.90 cm/s  SHUNTS MV E/A ratio:  1.25        Systemic VTI:  0.24 m                            Systemic Diam: 2.20 cm Rozann Lesches MD Electronically signed by Rozann Lesches MD Signature Date/Time: 03/17/2021/2:18:43 PM    Final      Subjective: Patient seen and examined at bedside, resting comfortably.  Received PCI/stent to OM on 5/16.  Did not get TR band off of wrist late last night which held up discharge.  Okay for discharge home with outpatient follow-up with cardiology.   No other complaints or concerns at this time.  Denies headache, no fever/chills/night sweats, no nausea/vomiting/diarrhea, no shortness of breath, no palpitations, no chest pain, no abdominal pain, no weakness, no fatigue, no paresthesias.  No acute events overnight per nursing staff.  Discharge Exam: Vitals:   03/19/21  0402 03/19/21 0730  BP: 135/73 138/74  Pulse: 64 61  Resp: 16 18  Temp: 97.9 F (36.6 C) 98 F (36.7 C)  SpO2: 98% 100%   Vitals:   03/18/21 2000 03/18/21 2320 03/19/21 0402 03/19/21 0730  BP: 134/84 (!) 130/100 135/73 138/74  Pulse: (!) 55 (!) 55 64 61  Resp: 20 18 16 18   Temp: 97.8 F (36.6 C) 98.2 F (36.8 C) 97.9 F (36.6 C) 98 F (36.7 C)  TempSrc: Oral Oral Axillary Oral  SpO2: 98% 97% 98% 100%  Weight:      Height:        General: Pt is alert, awake, not in acute distress Cardiovascular: Bradycardic, regular rhythm, S1/S2 +, no rubs, no gallops Respiratory: CTA bilaterally, no wheezing, no rhonchi, on room air Abdominal: Soft, NT, ND, bowel sounds + Extremities: no edema, no cyanosis    The results of significant diagnostics from this hospitalization (including imaging, microbiology, ancillary and laboratory) are listed below for reference.     Microbiology: Recent Results (from the past 240 hour(s))  SARS CORONAVIRUS 2 (TAT 6-24 HRS) Nasopharyngeal Nasopharyngeal Swab     Status: None   Collection Time: 03/16/21  9:45 PM   Specimen: Nasopharyngeal Swab  Result Value Ref Range Status   SARS Coronavirus 2 NEGATIVE NEGATIVE Final    Comment: (NOTE) SARS-CoV-2 target nucleic acids are NOT DETECTED.  The SARS-CoV-2 RNA is generally detectable in upper and lower respiratory specimens during the acute phase of infection. Negative results do not preclude SARS-CoV-2 infection, do not rule out co-infections with other pathogens, and should not be used as the sole basis for treatment or other patient management decisions. Negative results must be  combined with clinical observations, patient history, and epidemiological information. The expected result is Negative.  Fact Sheet for Patients: SugarRoll.be  Fact Sheet for Healthcare Providers: https://www.woods-mathews.com/  This test is not yet approved or cleared by the [place] FDA and  has been authorized for detection and/or diagnosis of SARS-CoV-2 by FDA under an Emergency Use Authorization (EUA). This EUA will remain  in effect (meaning this test can be used) for the duration of the COVID-19 declaration under Se ction 564(b)(1) of the Act, 21 U.S.C. section 360bbb-3(b)(1), unless the authorization is terminated or revoked sooner.  Performed at [facility], [place] [address] [place], [place] [address]      Labs: BNP (last 3 results) No results for input(s): BNP in the last 8760 hours. Basic Metabolic Panel: Recent Labs  Lab 03/16/21 1833 03/17/21 0010 03/18/21 0605 03/19/21 0211  NA 139 139 138 139  K 4.1 3.8 4.0 4.0  CL 106 105 106 107  CO2 26 28 26 25   GLUCOSE 92 97 108* 103*  BUN 21 18 17 14   CREATININE 1.09 1.03 1.01 1.14  CALCIUM 8.8* 8.7* 8.6* 8.6*  MG  --  1.8  --   --   PHOS  --  3.7  --   --    Liver Function Tests: Recent Labs  Lab 03/16/21 1833 03/17/21 0010  AST 28 24  ALT 25 23  ALKPHOS 68 66  BILITOT 0.9 0.8  PROT 6.0* 5.8*  ALBUMIN 3.5 3.4*   No results for input(s): LIPASE, AMYLASE in the last 168 hours. No results for input(s): AMMONIA in the last 168 hours. CBC: Recent Labs  Lab 03/16/21 1833 03/17/21 0010 03/18/21 0605 03/19/21 0211  WBC 8.9 6.5 5.9 8.2  NEUTROABS 6.0 3.2  --   --  HGB 11.7* 11.5* 11.5* 11.4*  HCT 37.2* 36.6* 36.6* 37.3*  MCV 75.8* 75.2* 74.5* 77.1*  PLT 233 233 180 219   Cardiac Enzymes: No results for input(s): CKTOTAL, CKMB, CKMBINDEX, TROPONINI in the last 168 hours. BNP: Invalid input(s): POCBNP CBG: No results for input(s): GLUCAP in the  last 168 hours. D-Dimer No results for input(s): DDIMER in the last 72 hours. Hgb A1c Recent Labs    03/17/21 0702  HGBA1C 5.8*   Lipid Profile Recent Labs    03/17/21 0702 03/18/21 0605  CHOL 103 104  HDL 33* 37*  LDLCALC 58 57  TRIG 59 50  CHOLHDL 3.1 2.8   Thyroid function studies Recent Labs    03/17/21 0010 03/17/21 0702  TSH 5.176*  --   T3FREE  --  2.3   Anemia work up No results for input(s): VITAMINB12, FOLATE, FERRITIN, TIBC, IRON, RETICCTPCT in the last 72 hours. Urinalysis No results found for: COLORURINE, APPEARANCEUR, [place], [place], [place], [place], [place], [place], PROTEINUR, UROBILINOGEN, NITRITE, LEUKOCYTESUR Sepsis Labs Invalid input(s): PROCALCITONIN,  WBC,  LACTICIDVEN Microbiology Recent Results (from the past 240 hour(s))  SARS CORONAVIRUS 2 (TAT 6-24 HRS) Nasopharyngeal Nasopharyngeal Swab     Status: None   Collection Time: 03/16/21  9:45 PM   Specimen: Nasopharyngeal Swab  Result Value Ref Range Status   SARS Coronavirus 2 NEGATIVE NEGATIVE Final    Comment: (NOTE) SARS-CoV-2 target nucleic acids are NOT DETECTED.  The SARS-CoV-2 RNA is generally detectable in upper and lower respiratory specimens during the acute phase of infection. Negative results do not preclude SARS-CoV-2 infection, do not rule out co-infections with other pathogens, and should not be used as the sole basis for treatment or other patient management decisions. Negative results must be combined with clinical observations, patient history, and epidemiological information. The expected result is Negative.  Fact Sheet for Patients: SugarRoll.be  Fact Sheet for Healthcare Providers: https://www.woods-mathews.com/  This test is not yet approved or cleared by the [place] FDA and  has been authorized for detection and/or diagnosis of SARS-CoV-2 by FDA under an Emergency Use Authorization (EUA). This EUA will  remain  in effect (meaning this test can be used) for the duration of the COVID-19 declaration under Se ction 564(b)(1) of the Act, 21 U.S.C. section 360bbb-3(b)(1), unless the authorization is terminated or revoked sooner.  Performed at [facility], [place] [address], [place], [place] [address]      Time coordinating discharge: Over 30 minutes  SIGNED:   Dicy Smigel J [place] DO  [facility] 03/19/2021, 10:59 AM

## 2021-03-18 NOTE — Progress Notes (Signed)
Pt continues to ooze from cath site, TR still in place.   Will keep pt overnight to follow.

## 2021-03-18 NOTE — Progress Notes (Signed)
PROGRESS NOTE    Jose Stone  [id number] DOB: 1946/02/13 DOA: 03/16/2021 PCP: Kathyrn Lass, MD    Brief Narrative:  Jose Stone is a 75 year old male with past medical history significant for CAD s/p PCI, VSD, right bundle branch block, paroxysmal atrial fibrillation on Xarelto, HLD, GERD, HTN, aortic valve repair who presents to [facility] ED on 5/14 by EMS with chief complaint of substernal chest pain.  Patient reports onset while seated working in his garage.  Patient also complained of dizziness and diaphoresis at the time.  Patient took 2 sublingual nitroglycerin with no relief and was given nitroglycerin and 324 mg of aspirin by EMS prior to arrival.  The pain is reported as substernal with no radiation, intermittent with no known exacerbating or alleviating factors.  In the ED, temperature 98.6 F, HR 56, RR 14, BP 121/61, SPO2 96% on room air.  Sodium 139, potassium 4.1, chloride 106, CO2 26, glucose 92, BUN 21, creatinine 1.09.  Troponin 7>7. WBC 8.9, Hgb 11.7, Plt 233.  Chest x-ray with no edema or airspace opacity, small granuloma left lower lobe.  EKG with sinus bradycardia, rate 49, QTc 373, T wave inversion lead III, no ST elevation/depression or other T wave inversion.  Cardiology was consulted.  Hospitalist service consulted for further evaluation and management.   Assessment & Plan:   Active Problems:   Essential hypertension   Hyperlipidemia   GERD (gastroesophageal reflux disease)   Coronary artery disease due to lipid rich plaque   OSA on CPAP   Symptomatic bradycardia   PAF (paroxysmal atrial fibrillation) (HCC)   Chest pain   Unstable angina (HCC)   Chest pain Hx CAD s/p PCI Patient presenting to ED after acute onset chest pain at rest with associated diaphoresis, weakness and dizziness.  No relief with nitroglycerin, but improved with IV morphine.  Troponin 7>7>9>10, within normal limits.  Chest x-ray with no acute findings.  EKG notable for sinus  bradycardia with a rate of 49, normal QTC with T wave inversion in lead III.  Patient with remote heart catheterization with 60-70% mLAD and 80% OM occlusion s/p PCI OM.  TTE with LVEF 55-30%, LV normal function, no regional wall motion abnormalities, LA severely dilated which is new in comparison to previous, trivial MR, aortic root dilation 44 mm with trivial aortic regurgitation. --Cardiology following, appreciate assistance --Heparin drip --Atorvastatin 80 mg p.o. daily --Aspirin 81 mg p.o. daily --Not on beta-blocker due to bradycardia --Plan LHC today -- Further per cardiology  Paroxysmal atrial fibrillation History of remote ablation.  Currently in sinus bradycardia.  Not on rate controlling medications due to bradycardia. --Holding Xarelto for cardiac catheterization --Heparin drip --Monitor on telemetry  Subclinical hypothyroidism TSH elevated 5.176 with free T4 0.96, within normal limits.    HLD Lipid panel with total cholesterol 104, HDL 37, LDL 57 --Atorvastatin 80 mg p.o. daily  GERD: Continue PPI  OSA: Nocturnal CPAP  DVT prophylaxis: Heparin drip   Code Status: Full Code Family Communication: No family present at bedside this morning  Disposition Plan:  Level of care: Progressive Status is: Inpatient  Remains inpatient appropriate because:Ongoing diagnostic testing needed not appropriate for outpatient work up, Unsafe d/c plan, IV treatments appropriate due to intensity of illness or inability to take PO and Inpatient level of care appropriate due to severity of illness   Dispo:  Patient From: Home  Planned Disposition: Home  Medically stable for discharge: No      Consultants:  Cardiology  Procedures:   TTE   Left heart catheterization  Antimicrobials:   None   Subjective: Patient seen and examined at bedside, resting comfortably.  Patient reports intermittent chest discomfort overnight, currently chest pain-free.  Awaiting left heart  catheterization today.  No family present this morning.  No other complaints or concerns at this time.  Denies headache, no visual changes, no fever/chills/night sweats, no nausea/vomiting/diarrhea, no current chest pain, no shortness of breath, no dizziness, no palpitations, no abdominal pain, no weakness, no fatigue, no paresthesias.  No acute events reported overnight by nursing staff.   Objective: Vitals:   03/17/21 0939 03/17/21 2010 03/17/21 2030 03/18/21 0614  BP: 137/84 (!) 150/79  140/73  Pulse: (!) 47 (!) 52 (!) 57 (!) 47  Resp: 18 (!) 198 18 20  Temp: 97.9 F (36.6 C) 98.3 F (36.8 C)  98.3 F (36.8 C)  TempSrc: Oral Oral  Oral  SpO2: 98% 99% 97% 98%  Weight:    101.6 kg  Height:        Intake/Output Summary (Last 24 hours) at 03/18/2021 1021 Last data filed at 03/18/2021 0624 Gross per 24 hour  Intake 871.22 ml  Output --  Net 871.22 ml   Filed Weights   03/16/21 2300 03/18/21 0614  Weight: 103.6 kg 101.6 kg    Examination:  General exam: Appears calm and comfortable  Respiratory system: Clear to auscultation. Respiratory effort normal.  On room air Cardiovascular system: S1 & S2 heard, bradycardic, regular rhythm. Murmur LLSB, No JVD, rubs, gallops or clicks. No pedal edema. Gastrointestinal system: Abdomen is nondistended, soft and nontender. No organomegaly or masses felt. Normal bowel sounds heard. Central nervous system: Alert and oriented. No focal neurological deficits. Extremities: Symmetric 5 x 5 power. Skin: No rashes, lesions or ulcers Psychiatry: Judgement and insight appear normal. Mood & affect appropriate.     Data Reviewed: I have personally reviewed following labs and imaging studies  CBC: Recent Labs  Lab 03/16/21 1833 03/17/21 0010 03/18/21 0605  WBC 8.9 6.5 5.9  NEUTROABS 6.0 3.2  --   HGB 11.7* 11.5* 11.5*  HCT 37.2* 36.6* 36.6*  MCV 75.8* 75.2* 74.5*  PLT 233 233 382   Basic Metabolic Panel: Recent Labs  Lab 03/16/21 1833  03/17/21 0010 03/18/21 0605  NA 139 139 138  K 4.1 3.8 4.0  CL 106 105 106  CO2 26 28 26   GLUCOSE 92 97 108*  BUN 21 18 17   CREATININE 1.09 1.03 1.01  CALCIUM 8.8* 8.7* 8.6*  MG  --  1.8  --   PHOS  --  3.7  --    GFR: Estimated Creatinine Clearance: 81.6 mL/min (by C-G formula based on SCr of 1.01 mg/dL). Liver Function Tests: Recent Labs  Lab 03/16/21 1833 03/17/21 0010  AST 28 24  ALT 25 23  ALKPHOS 68 66  BILITOT 0.9 0.8  PROT 6.0* 5.8*  ALBUMIN 3.5 3.4*   No results for input(s): LIPASE, AMYLASE in the last 168 hours. No results for input(s): AMMONIA in the last 168 hours. Coagulation Profile: No results for input(s): INR, PROTIME in the last 168 hours. Cardiac Enzymes: No results for input(s): CKTOTAL, CKMB, CKMBINDEX, TROPONINI in the last 168 hours. BNP (last 3 results) No results for input(s): PROBNP in the last 8760 hours. HbA1C: Recent Labs    03/17/21 0702  HGBA1C 5.8*   CBG: No results for input(s): GLUCAP in the last 168 hours. Lipid Profile: Recent Labs  03/17/21 0702 03/18/21 0605  CHOL 103 104  HDL 33* 37*  LDLCALC 58 57  TRIG 59 50  CHOLHDL 3.1 2.8   Thyroid Function Tests: Recent Labs    03/17/21 0010 03/17/21 0702  TSH 5.176*  --   FREET4  --  0.96   Anemia Panel: No results for input(s): VITAMINB12, FOLATE, FERRITIN, TIBC, IRON, RETICCTPCT in the last 72 hours. Sepsis Labs: No results for input(s): PROCALCITON, LATICACIDVEN in the last 168 hours.  Recent Results (from the past 240 hour(s))  SARS CORONAVIRUS 2 (TAT 6-24 HRS) Nasopharyngeal Nasopharyngeal Swab     Status: None   Collection Time: 03/16/21  9:45 PM   Specimen: Nasopharyngeal Swab  Result Value Ref Range Status   SARS Coronavirus 2 NEGATIVE NEGATIVE Final    Comment: (NOTE) SARS-CoV-2 target nucleic acids are NOT DETECTED.  The SARS-CoV-2 RNA is generally detectable in upper and lower respiratory specimens during the acute phase of infection.  Negative results do not preclude SARS-CoV-2 infection, do not rule out co-infections with other pathogens, and should not be used as the sole basis for treatment or other patient management decisions. Negative results must be combined with clinical observations, patient history, and epidemiological information. The expected result is Negative.  Fact Sheet for Patients: SugarRoll.be  Fact Sheet for Healthcare Providers: https://www.woods-mathews.com/  This test is not yet approved or cleared by the [place] FDA and  has been authorized for detection and/or diagnosis of SARS-CoV-2 by FDA under an Emergency Use Authorization (EUA). This EUA will remain  in effect (meaning this test can be used) for the duration of the COVID-19 declaration under Se ction 564(b)(1) of the Act, 21 U.S.C. section 360bbb-3(b)(1), unless the authorization is terminated or revoked sooner.  Performed at [facility], [place] [address], [place], [place] [address]          Radiology Studies: DG Chest 2 View  Result Date: 03/16/2021 CLINICAL DATA:  Chest pain EXAM: CHEST - 2 VIEW COMPARISON:  July 13, 2018 FINDINGS: Small granuloma left lower lobe region. No edema or airspace opacity. Heart is upper normal in size with pulmonary vascularity normal. No adenopathy. No pneumothorax. No bone lesions. Focal calcification in right carotid artery. Colonic interposition between right hemidiaphragm and liver noted incidentally. IMPRESSION: No edema or airspace opacity. Small granuloma left lower lobe. Stable cardiac silhouette. There is right carotid artery calcification. Electronically Signed   By: Lowella Grip III M.D.   On: 03/16/2021 19:35   ECHOCARDIOGRAM COMPLETE  Result Date: 03/17/2021    ECHOCARDIOGRAM REPORT   Patient Name:   Jose Stone Date of Exam: 03/17/2021 Medical Rec #:  [id number]      Height:       73.0 in Accession #:    [id number]      Weight:       228.5 lb Date of Birth:  03/06/1946       BSA:          2.277 m Patient Age:    62 years       BP:           137/84 mmHg Patient Gender: M              HR:           47 bpm. Exam Location:  Inpatient Procedure: 2D Echo, Cardiac Doppler and Color Doppler Indications:    Chest Pain R07.9  History:        Patient has prior  history of Echocardiogram examinations, most                 recent 06/28/2020. CAD, Arrythmias:Atrial Fibrillation, RBBB and                 Atrial Flutter; Risk Factors:Hypertension and Dyslipidemia. OSA                 on CPAP, Dilated aortic root, VSD (ventricular septal defect).  Sonographer:    Alvino Chapel RCS Referring Phys: [phone] ANASTASSIA DOUTOVA  Sonographer Comments: Subcostal images were difficult to obatin. Could NOT obtain IVC. IMPRESSIONS  1. 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 Left ventricular ejection fraction, by estimation, is 55 to 60%. The left ventricle has normal function. The left ventricle has no regional wall motion abnormalities. There is mild left ventricular hypertrophy. Left ventricular diastolic parameters were normal.  2. RV-RA gradient 36 mmHg suggesting at least mildly increased RVSP. Right ventricular systolic function is normal. The right ventricular size is normal.  3. Left atrial size was severely dilated.  4. The mitral valve is grossly normal. Trivial mitral valve regurgitation.  5. The aortic valve is tricuspid. Aortic valve regurgitation is trivial. Mild aortic valve sclerosis is present, with no evidence of aortic valve stenosis.  6. Aortic dilatation noted. There is moderate dilatation of the aortic root, measuring 44 mm.  7. Unable to estimate CVP. FINDINGS  Left Ventricle: Permembranous VSD evident with left to right shunt by color Doppler. Left ventricular ejection fraction, by estimation, is 55 to 60%. The left ventricle has normal function. The left ventricle has no regional wall motion abnormalities. The  left ventricular internal cavity size was normal in size. There is mild left ventricular hypertrophy. Left ventricular diastolic parameters were normal. Right Ventricle: RV-RA gradient 36 mmHg suggesting at least mildly increased RVSP. The right ventricular size is normal. No increase in right ventricular wall thickness. Right ventricular systolic function is normal. Left Atrium: Left atrial size was severely dilated. Right Atrium: Right atrial size was normal in size. Pericardium: There is no evidence of pericardial effusion. Mitral Valve: The mitral valve is grossly normal. Trivial mitral valve regurgitation. Tricuspid Valve: The tricuspid valve is grossly normal. Tricuspid valve regurgitation is mild. Aortic Valve: The aortic valve is tricuspid. There is mild aortic valve annular calcification. Aortic valve regurgitation is trivial. Mild aortic valve sclerosis is present, with no evidence of aortic valve stenosis. Pulmonic Valve: The pulmonic valve was grossly normal. Pulmonic valve regurgitation is trivial. Aorta: Aortic dilatation noted. There is moderate dilatation of the aortic root, measuring 44 mm. Venous: Unable to estimate CVP. The inferior vena cava was not well visualized. IAS/Shunts: No atrial level shunt detected by color flow Doppler.  LEFT VENTRICLE PLAX 2D LVIDd:         4.60 cm  Diastology LVIDs:         3.40 cm  LV e' medial:    8.27 cm/s LV PW:         1.00 cm  LV E/e' medial:  10.3 LV IVS:        1.10 cm  LV e' lateral:   11.60 cm/s LVOT diam:     2.20 cm  LV E/e' lateral: 7.3 LV SV:         93 LV SV Index:   41 LVOT Area:     3.80 cm  RIGHT VENTRICLE RV S prime:     12.70 cm/s TAPSE (M-mode): 2.5 cm  LEFT ATRIUM              Index       RIGHT ATRIUM           Index LA diam:        4.50 cm  1.98 cm/m  RA Area:     21.40 cm LA Vol (A2C):   133.0 ml 58.41 ml/m RA Volume:   63.30 ml  27.80 ml/m LA Vol (A4C):   118.0 ml 51.82 ml/m LA Biplane Vol: 126.0 ml 55.34 ml/m  AORTIC VALVE LVOT Vmax:    108.00 cm/s LVOT Vmean:  72.700 cm/s LVOT VTI:    0.245 m  AORTA Ao Root diam: 4.40 cm MITRAL VALVE               TRICUSPID VALVE MV Area (PHT): 3.53 cm    TR Peak grad:   35.8 mmHg MV Decel Time: 215 msec    TR Vmax:        299.00 cm/s MV E velocity: 85.00 cm/s MV A velocity: 67.90 cm/s  SHUNTS MV E/A ratio:  1.25        Systemic VTI:  0.24 m                            Systemic Diam: 2.20 cm Rozann Lesches MD Electronically signed by Rozann Lesches MD Signature Date/Time: 03/17/2021/2:18:43 PM    Final         Scheduled Meds: . [MAR Hold] aspirin EC  81 mg Oral Daily  . [MAR Hold] atorvastatin  80 mg Oral Daily  . [MAR Hold] pantoprazole  40 mg Oral QHS  . sodium chloride flush  3 mL Intravenous Q12H   Continuous Infusions: . sodium chloride    . sodium chloride 1 mL/kg/hr (03/18/21 0526)  . heparin 1,550 Units/hr (03/18/21 0755)     LOS: 1 day    Time spent: 39 minutes spent on chart review, discussion with nursing staff, consultants, updating family and interview/physical exam; more than 50% of that time was spent in counseling and/or coordination of care.    Katleen Carraway J [place] DO [facility] Available via Epic secure chat 7am-7pm After these hours, please refer to coverage provider listed on [email or web address] 03/18/2021, 10:21 AM

## 2021-03-18 NOTE — Progress Notes (Addendum)
During shift stat ekg was performed due to possible A. Fib. Pt was asymptomatic. Cardiology was contacted and advised.

## 2021-03-19 ENCOUNTER — Other Ambulatory Visit: Payer: Self-pay | Admitting: Internal Medicine

## 2021-03-19 DIAGNOSIS — I2510 Atherosclerotic heart disease of native coronary artery without angina pectoris: Secondary | ICD-10-CM | POA: Diagnosis not present

## 2021-03-19 DIAGNOSIS — I10 Essential (primary) hypertension: Secondary | ICD-10-CM | POA: Diagnosis not present

## 2021-03-19 DIAGNOSIS — I25110 Atherosclerotic heart disease of native coronary artery with unstable angina pectoris: Secondary | ICD-10-CM | POA: Diagnosis not present

## 2021-03-19 DIAGNOSIS — K219 Gastro-esophageal reflux disease without esophagitis: Secondary | ICD-10-CM | POA: Diagnosis not present

## 2021-03-19 DIAGNOSIS — Z955 Presence of coronary angioplasty implant and graft: Secondary | ICD-10-CM

## 2021-03-19 DIAGNOSIS — E7800 Pure hypercholesterolemia, unspecified: Secondary | ICD-10-CM | POA: Diagnosis not present

## 2021-03-19 LAB — CBC
HCT: 37.3 % — ABNORMAL LOW (ref 39.0–52.0)
Hemoglobin: 11.4 g/dL — ABNORMAL LOW (ref 13.0–17.0)
MCH: 23.6 pg — ABNORMAL LOW (ref 26.0–34.0)
MCHC: 30.6 g/dL (ref 30.0–36.0)
MCV: 77.1 fL — ABNORMAL LOW (ref 80.0–100.0)
Platelets: 219 10*3/uL (ref 150–400)
RBC: 4.84 MIL/uL (ref 4.22–5.81)
RDW: 17.2 % — ABNORMAL HIGH (ref 11.5–15.5)
WBC: 8.2 10*3/uL (ref 4.0–10.5)
nRBC: 0 % (ref 0.0–0.2)

## 2021-03-19 LAB — BASIC METABOLIC PANEL
Anion gap: 7 (ref 5–15)
BUN: 14 mg/dL (ref 8–23)
CO2: 25 mmol/L (ref 22–32)
Calcium: 8.6 mg/dL — ABNORMAL LOW (ref 8.9–10.3)
Chloride: 107 mmol/L (ref 98–111)
Creatinine, Ser: 1.14 mg/dL (ref 0.61–1.24)
GFR, Estimated: >60 mL/min (ref >60)
Glucose, Bld: 103 mg/dL — ABNORMAL HIGH (ref 70–99)
Potassium: 4 mmol/L (ref 3.5–5.1)
Sodium: 139 mmol/L (ref 135–145)

## 2021-03-19 NOTE — Progress Notes (Signed)
CARDIAC REHAB PHASE I   PRE:  Rate/Rhythm: 64 SR  BP:  Supine:   Sitting: 138/74  Standing:    SaO2:   MODE:  Ambulation: 750 ft   POST:  Rate/Rhythm: 78 SR  BP:  Supine:   Sitting: 154/82  Standing:    SaO2: 98%RA 0730-0815 Pt walked 750 ft on RA with steady gait and no CP. Tolerated well. Education completed with pt who voiced understanding. Stressed importance of plavix with stent. Gave walking instructions for ex, heart healthy diet and reviewed NTG use. Pt has attended CRP 2 before. Referred back to [facility] program.   Graylon Good, RN BSN  03/19/2021 8:11 AM

## 2021-03-19 NOTE — Progress Notes (Signed)
Progress Note  Patient Name: Jose Stone Date of Encounter: 03/19/2021  [facility] Cardiologist: [name], MD   Subjective   Pt is very concerned about dilated left atrium on echo this admission - review of previous echos does not mention this. Will ask Dr. Oval Linsey to review. Mild biatrial enlargement noted on CTA 07/2020  Inpatient Medications    Scheduled Meds: . aspirin EC  81 mg Oral Daily  . atorvastatin  80 mg Oral Daily  . clopidogrel  75 mg Oral Q breakfast  . pantoprazole  40 mg Oral QHS  . rivaroxaban  20 mg Oral Q supper  . sodium chloride flush  3 mL Intravenous Q12H   Continuous Infusions: . sodium chloride     PRN Meds: sodium chloride, acetaminophen **OR** acetaminophen, morphine injection, nitroGLYCERIN, ondansetron (ZOFRAN) IV, sodium chloride flush   Vital Signs    Vitals:   03/18/21 2000 03/18/21 2320 03/19/21 0402 03/19/21 0730  BP: 134/84 (!) 130/100 135/73 138/74  Pulse: (!) 55 (!) 55 64 61  Resp: 20 18 16 18   Temp: 97.8 F (36.6 C) 98.2 F (36.8 C) 97.9 F (36.6 C) 98 F (36.7 C)  TempSrc: Oral Oral Axillary Oral  SpO2: 98% 97% 98% 100%  Weight:      Height:        Intake/Output Summary (Last 24 hours) at 03/19/2021 0847 Last data filed at 03/18/2021 2057 Gross per 24 hour  Intake 548.6 ml  Output --  Net 548.6 ml   Last 3 Weights 03/18/2021 03/16/2021 01/21/2021  Weight (lbs) 224 lb 228 lb 8 oz 238 lb 3.2 oz  Weight (kg) 101.606 kg 103.647 kg 108.047 kg      Telemetry    Sinus bradycardia in the 50s, Afib rate controlled - Personally Reviewed  ECG    No new tracings - Personally Reviewed  Physical Exam   GEN: No acute distress.   Neck: No JVD Cardiac: RRR, no murmurs, rubs, or gallops.  Respiratory: Clear to auscultation bilaterally. GI: Soft, nontender, non-distended  MS: No edema; No deformity. Neuro:  Nonfocal  Psych: Normal affect  Right radial site C/D/I, no oozing  Labs    High Sensitivity  Troponin:   Recent Labs  Lab 03/16/21 1833 03/16/21 2033 03/17/21 0010 03/17/21 0229  TROPONINIHS 7 7 9 10       Chemistry Recent Labs  Lab 03/16/21 1833 03/17/21 0010 03/18/21 0605 03/19/21 0211  NA 139 139 138 139  K 4.1 3.8 4.0 4.0  CL 106 105 106 107  CO2 26 28 26 25   GLUCOSE 92 97 108* 103*  BUN 21 18 17 14   CREATININE 1.09 1.03 1.01 1.14  CALCIUM 8.8* 8.7* 8.6* 8.6*  PROT 6.0* 5.8*  --   --   ALBUMIN 3.5 3.4*  --   --   AST 28 24  --   --   ALT 25 23  --   --   ALKPHOS 68 66  --   --   BILITOT 0.9 0.8  --   --   GFRNONAA >60 >60 >60 >60  ANIONGAP 7 6 6 7      Hematology Recent Labs  Lab 03/17/21 0010 03/18/21 0605 03/19/21 0211  WBC 6.5 5.9 8.2  RBC 4.87 4.91 4.84  HGB 11.5* 11.5* 11.4*  HCT 36.6* 36.6* 37.3*  MCV 75.2* 74.5* 77.1*  MCH 23.6* 23.4* 23.6*  MCHC 31.4 31.4 30.6  RDW 17.1* 17.2* 17.2*  PLT 233 180 219  BNPNo results for input(s): BNP, PROBNP in the last 168 hours.   DDimer No results for input(s): DDIMER in the last 168 hours.   Radiology    CARDIAC CATHETERIZATION  Result Date: 03/18/2021  Prox RCA lesion is 60% stenosed.  1st Mrg-1 lesion is 95% stenosed.  1st Mrg-2 lesion is 10% stenosed.  A drug-eluting stent was successfully placed using a SYNERGY XD 2.75X12.  Post intervention, there is a 0% residual stenosis.  LPAV lesion is 60% stenosed.  Prox Cx to Mid Cx lesion is 50% stenosed.  Prox LAD to Mid LAD lesion is 60% stenosed.  Mid LAD lesion is 60% stenosed.  1. Moderate disease in the mid LAD that does not appear to be changed from last cath. These lesions do not appear to be flow limiting 2. Severe stenosis proximal segment of the first obtuse marginal branch just proximal to the old stent. Moderate mid and distal Circumflex stenosis. 3. Moderate disease in the small non-dominant RCA 4. Successful PTCA/DES x 1 obtuse marginal branch. Recommendations: He will need DAPT with ASA and Plavix for one month then would stop ASA  and continue Plavix along with the Xarelto.   ECHOCARDIOGRAM COMPLETE  Result Date: 03/17/2021    ECHOCARDIOGRAM REPORT   Patient Name:   Jose Stone Date of Exam: 03/17/2021 Medical Rec #:  [id number]      Height:       73.0 in Accession #:    [id number]     Weight:       228.5 lb Date of Birth:  1946/01/14       BSA:          2.277 m Patient Age:    75 years       BP:           137/84 mmHg Patient Gender: M              HR:           47 bpm. Exam Location:  Inpatient Procedure: 2D Echo, Cardiac Doppler and Color Doppler Indications:    Chest Pain R07.9  History:        Patient has prior history of Echocardiogram examinations, most                 recent 06/28/2020. CAD, Arrythmias:Atrial Fibrillation, RBBB and                 Atrial Flutter; Risk Factors:Hypertension and Dyslipidemia. OSA                 on CPAP, Dilated aortic root, VSD (ventricular septal defect).  Sonographer:    Alvino Chapel RCS Referring Phys: [phone] ANASTASSIA DOUTOVA  Sonographer Comments: Subcostal images were difficult to obatin. Could NOT obtain IVC. IMPRESSIONS  1. 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 Left ventricular ejection fraction, by estimation, is 55 to 60%. The left ventricle has normal function. The left ventricle has no regional wall motion abnormalities. There is mild left ventricular hypertrophy. Left ventricular diastolic parameters were normal.  2. RV-RA gradient 36 mmHg suggesting at least mildly increased RVSP. Right ventricular systolic function is normal. The right ventricular size is normal.  3. Left atrial size was severely dilated.  4. The mitral valve is grossly normal. Trivial mitral valve regurgitation.  5. The aortic valve is tricuspid. Aortic valve regurgitation is trivial. Mild aortic valve sclerosis is present, with no evidence of aortic valve stenosis.  6. Aortic dilatation  noted. There is moderate dilatation of the aortic root, measuring 44 mm.  7. Unable to estimate CVP.  FINDINGS  Left Ventricle: Permembranous VSD evident with left to right shunt by color Doppler. Left ventricular ejection fraction, by estimation, is 55 to 60%. The left ventricle has normal function. The left ventricle has no regional wall motion abnormalities. The left ventricular internal cavity size was normal in size. There is mild left ventricular hypertrophy. Left ventricular diastolic parameters were normal. Right Ventricle: RV-RA gradient 36 mmHg suggesting at least mildly increased RVSP. The right ventricular size is normal. No increase in right ventricular wall thickness. Right ventricular systolic function is normal. Left Atrium: Left atrial size was severely dilated. Right Atrium: Right atrial size was normal in size. Pericardium: There is no evidence of pericardial effusion. Mitral Valve: The mitral valve is grossly normal. Trivial mitral valve regurgitation. Tricuspid Valve: The tricuspid valve is grossly normal. Tricuspid valve regurgitation is mild. Aortic Valve: The aortic valve is tricuspid. There is mild aortic valve annular calcification. Aortic valve regurgitation is trivial. Mild aortic valve sclerosis is present, with no evidence of aortic valve stenosis. Pulmonic Valve: The pulmonic valve was grossly normal. Pulmonic valve regurgitation is trivial. Aorta: Aortic dilatation noted. There is moderate dilatation of the aortic root, measuring 44 mm. Venous: Unable to estimate CVP. The inferior vena cava was not well visualized. IAS/Shunts: No atrial level shunt detected by color flow Doppler.  LEFT VENTRICLE PLAX 2D LVIDd:         4.60 cm  Diastology LVIDs:         3.40 cm  LV e' medial:    8.27 cm/s LV PW:         1.00 cm  LV E/e' medial:  10.3 LV IVS:        1.10 cm  LV e' lateral:   11.60 cm/s LVOT diam:     2.20 cm  LV E/e' lateral: 7.3 LV SV:         93 LV SV Index:   41 LVOT Area:     3.80 cm  RIGHT VENTRICLE RV S prime:     12.70 cm/s TAPSE (M-mode): 2.5 cm LEFT ATRIUM              Index        RIGHT ATRIUM           Index LA diam:        4.50 cm  1.98 cm/m  RA Area:     21.40 cm LA Vol (A2C):   133.0 ml 58.41 ml/m RA Volume:   63.30 ml  27.80 ml/m LA Vol (A4C):   118.0 ml 51.82 ml/m LA Biplane Vol: 126.0 ml 55.34 ml/m  AORTIC VALVE LVOT Vmax:   108.00 cm/s LVOT Vmean:  72.700 cm/s LVOT VTI:    0.245 m  AORTA Ao Root diam: 4.40 cm MITRAL VALVE               TRICUSPID VALVE MV Area (PHT): 3.53 cm    TR Peak grad:   35.8 mmHg MV Decel Time: 215 msec    TR Vmax:        299.00 cm/s MV E velocity: 85.00 cm/s MV A velocity: 67.90 cm/s  SHUNTS MV E/A ratio:  1.25        Systemic VTI:  0.24 m  Systemic Diam: 2.20 cm Rozann Lesches MD Electronically signed by Rozann Lesches MD Signature Date/Time: 03/17/2021/2:18:43 PM    Final     Cardiac Studies   Left heart cath 03/18/21:  Prox RCA lesion is 60% stenosed.  1st Mrg-1 lesion is 95% stenosed.  1st Mrg-2 lesion is 10% stenosed.  A drug-eluting stent was successfully placed using a SYNERGY XD 2.75X12.  Post intervention, there is a 0% residual stenosis.  LPAV lesion is 60% stenosed.  Prox Cx to Mid Cx lesion is 50% stenosed.  Prox LAD to Mid LAD lesion is 60% stenosed.  Mid LAD lesion is 60% stenosed.   1. Moderate disease in the mid LAD that does not appear to be changed from last cath. These lesions do not appear to be flow limiting 2. Severe stenosis proximal segment of the first obtuse marginal branch just proximal to the old stent. Moderate mid and distal Circumflex stenosis.  3. Moderate disease in the small non-dominant RCA 4. Successful PTCA/DES x 1 obtuse marginal branch.   Recommendations: He will need DAPT with ASA and Plavix for one month then would stop ASA and continue Plavix along with the Xarelto.    Echo 03/17/21: 1. 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  Left ventricular ejection fraction, by estimation, is 55 to 60%. The left  ventricle has  normal function. The left ventricle has no regional wall  motion abnormalities. There is mild  left ventricular hypertrophy. Left ventricular diastolic parameters were  normal.  2. RV-RA gradient 36 mmHg suggesting at least mildly increased RVSP.  Right ventricular systolic function is normal. The right ventricular size  is normal.  3. Left atrial size was severely dilated.  4. The mitral valve is grossly normal. Trivial mitral valve  regurgitation.  5. The aortic valve is tricuspid. Aortic valve regurgitation is trivial.  Mild aortic valve sclerosis is present, with no evidence of aortic valve  stenosis.  6. Aortic dilatation noted. There is moderate dilatation of the aortic  root, measuring 44 mm.  7. Unable to estimate CVP.    Patient Profile     75 y.o. male  with a hx of CADs/p PCI,aortic valve regurg, atrial fibrillations/p ablation on xarelto, VSD, right bundle branch blocknow admitted with chest pain and EKG changes.   Assessment & Plan    Chest pain CAD with hx of PCI - EKG wit new inferior TWI and chest pain - heart cath yesterday with 90% stenosis in first marginal branch successfully treated with DES; remaining disease treated medically (see cath report) - started on ASA and plavix - triple therapy for 30 days, then stop ASA - he was kept overnight due to oozing at cath site - radial site today C/D/I, no hematoma - no BB due to bradycardia, continue valsartan   Afib s/p ablation Chronic anticoagulation - telemetry and EKG reviewed - possible Afib for about 10 sec yesterday - continue xarelto - no BB given bradycardia   Hyperlipidemia with LDL goal < 70 03/18/2021: Cholesterol 104; HDL 37; LDL Cholesterol 57; Triglycerides 50; VLDL 10 Continue 80 mg lipitor   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   For questions or updates, please contact [place]  Please consult [email or web address] for contact info under        Signed, Ledora Bottcher, PA  03/19/2021, 8:47 AM

## 2021-03-20 ENCOUNTER — Telehealth (HOSPITAL_COMMUNITY): Payer: Self-pay

## 2021-03-20 NOTE — Telephone Encounter (Signed)
Pt insurance is active and benefits verified through Medicare A/B Co-pay 0, DED $233/$233 met, out of pocket 0/0 met, co-insurance 20%. no pre-authorization required. Passport, 03/20/2021_0 Terrence Dupont, REF# [id number]  2ndary insurance is active and benefits verified through [facility]. Co-pay 0, DED 0/0 met, out of pocket 0/0 met, co-insurance 0%. No pre-authorization required. Passport, 03/20/2021_1 :13am, REF# [id number]   Will contact patient to see if he is interested in the Cardiac Rehab Program. If interested, patient will need to complete follow up appt. Once completed, patient will be contacted for scheduling upon review by the RN Navigator.

## 2021-03-20 NOTE — Telephone Encounter (Signed)
Called patient to see if he is interested in the Cardiac Rehab Program. Patient expressed interest. Explained scheduling process and went over insurance, patient verbalized understanding. Will contact patient for scheduling once f/u has been completed.  °

## 2021-03-25 ENCOUNTER — Other Ambulatory Visit: Payer: Self-pay

## 2021-03-25 ENCOUNTER — Encounter (HOSPITAL_BASED_OUTPATIENT_CLINIC_OR_DEPARTMENT_OTHER): Payer: Medicare Other | Admitting: Dietician

## 2021-03-25 DIAGNOSIS — R7303 Prediabetes: Secondary | ICD-10-CM

## 2021-03-25 NOTE — Progress Notes (Addendum)
On 03/25/2021 patient completed a post core session of the Diabetes Prevention Program course virtually with [facility]. By the end of this session patients are able to complete the following objectives:   Virtual Visit via Video Note  I connected with Jose Stone, 1946/03/17 by a video enabled application and verified that I am speaking with the correct person using two identifiers.  Location: Patient: Virtual Provider: Office  Learning Objectives:  Identify how to maintain and/or continue working toward program goals for the remainder of the program.   Describe ways that food and activity tracking can assist them in maintaining/reaching program goals.   Identify progress they have made since the beginning of the program.   Describe the differences between unsaturated, saturated, and trans fat on heart health.   List dietary sources of unsaturated, saturated, and trans fats.  Explain ways to reduce intake of saturated fat and replace them with heart healthy fats.  Goals:   Record weight taken outside of class.   Track foods and beverages eaten each day in the "Food and Activity Tracker," including calories and fat grams for each item.    Track activity type, minutes you were active, and distance you reached each day in the "Food and Activity Tracker."   Follow-Up Plan: . Attend next session.  . Email completed "Food and Activity Trackers" before next session to be reviewed by Lifestyle Coach.

## 2021-04-02 NOTE — Progress Notes (Signed)
Cardiology Clinic Note   Patient Name: Jose Stone Date of Encounter: 04/04/2021  Primary Care Provider:  Kathyrn Lass, MD Primary Cardiologist:  [name], MD  Patient Profile    Jose Stone 75 year old male presents to clinic today for follow-up evaluation of his coronary artery disease status post PCI with DES x1 to his first marginal branch.  03/18/2021  Past Medical History    Past Medical History:  Diagnosis Date  . Aortic root enlargement ([place])   . CAD (coronary artery disease)   . Complication of anesthesia    Hypotensive with general anesth.  . GERD (gastroesophageal reflux disease)   . Hyperlipidemia   . Hypertension   . Melanoma ([place])   . Persistent atrial fibrillation ([place])    Afib,Aflutter  . Typical atrial flutter ([place])   . 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 Ventricular septal defect    Past Surgical History:  Procedure Laterality Date  . ATRIAL FIBRILLATION ABLATION N/A 07/20/2020   Procedure: ATRIAL FIBRILLATION ABLATION;  Surgeon: [name], MD;  Location: [facility];  Service: Cardiovascular;  Laterality: N/A;  . CARDIAC CATHETERIZATION    . CARDIOVERSION N/A 02/29/2020   Procedure: CARDIOVERSION;  Surgeon: [name], MD;  Location: [facility] ENDOSCOPY;  Service: Cardiovascular;  Laterality: N/A;  . CARDIOVERSION N/A 06/28/2020   Procedure: CARDIOVERSION;  Surgeon: Thayer Headings, MD;  Location: [facility] ENDOSCOPY;  Service: Cardiovascular;  Laterality: N/A;  . CORONARY STENT INTERVENTION N/A 03/18/2021   Procedure: CORONARY STENT INTERVENTION;  Surgeon: Burnell Blanks, MD;  Location: [facility];  Service: Cardiovascular;  Laterality: N/A;  . DISTAL INTERPHALANGEAL JOINT FUSION Left 01/19/2019   Procedure: HALLUX INTERPHANGEAL JOINT FUSION LEFT FOOT;  Surgeon: Trula Slade, DPM;  Location: [facility];  Service: Podiatry;  Laterality: Left;  LEFT BLOCK  . hipospedious    . LEFT HEART  CATH AND CORONARY ANGIOGRAPHY N/A 03/18/2021   Procedure: LEFT HEART CATH AND CORONARY ANGIOGRAPHY;  Surgeon: Burnell Blanks, MD;  Location: [facility];  Service: Cardiovascular;  Laterality: N/A;  . LEFT HEART CATHETERIZATION WITH CORONARY ANGIOGRAM N/A 08/14/2014   Procedure: LEFT HEART CATHETERIZATION WITH CORONARY ANGIOGRAM;  Surgeon: Birdie Riddle, MD;  Location: [facility];  Service: Cardiovascular;  Laterality: N/A;  . MASS EXCISION Left 01/19/2019   Procedure: EXCISION BENIGN LESION 2.OCM LEFT FOOT;  Surgeon: Trula Slade, DPM;  Location: [facility];  Service: Podiatry;  Laterality: Left;  . SHOULDER ARTHROSCOPY WITH SUBACROMIAL DECOMPRESSION Left 05/03/2020   Procedure: SHOULDER ARTHROSCOPY DEBRIDEMENT PARTIAL ROTATOR CUFF REPAIR, SUBACROMIAL DECOMPRESSION, BICEPS TENOTOMY;  Surgeon: Tania Ade, MD;  Location: [facility];  Service: Orthopedics;  Laterality: Left;  . SHOULDER SURGERY    . TONSILLECTOMY  1953    Allergies  Allergies  Allergen Reactions  . Ciprofloxacin     Other reaction(s): bulging aorta  . Codeine Nausea Only    Can tolerate with benadryl  . Diltiazem Cd [Diltiazem Hcl]     Other reaction(s): Bradycardia  . Mupirocin Itching  . Neosporin [Bacitracin-Polymyxin B]     Other reaction(s): rash  . Other     Other reaction(s): rash  . Tramadol Hcl     Other reaction(s): rash  . Hydrocodone Rash    Can tolerate with benadryl  . Neosporin [Neomycin-Bacitracin Zn-Polymyx] Rash  . Oxycodone Rash    Can tolerate with benadryl    History of Present Illness  Jose Stone has a PMH of coronary artery disease status post PCI, aortic valve regurgitation, atrial fibrillation status post ablation (9/21) on Xarelto, VSD, ascending aortic aneurysm, and RBBB.  He was admitted with chest pain and noted to have EKG changes.  He was taken to the Cath Lab and underwent cardiac catheterization which showed a 95% first marginal stenosis which received DES  x1.  He was also noted to have 60% proximal RCA stenosis, 10% second marginal stenosis, and LPA V 60% stenosis, 50% proximal-mid circumflex stenosis, and 60% mid LAD stenosis.  His remaining disease was elected to be medically managed.  He was started on ASA and Plavix.  With plan to maintain triple therapy for 30 days and then stop aspirin.  No BB started due to bradycardia however, has valsartan was continued.  He presents to clinic today for follow-up evaluation states he feels well.  We reviewed his angiography and stent placement.  We reviewed his cholesterol panel and current medications.  We reviewed his aortic root dilation and recommendations for monitoring.  He takes Prilosec for GERD.  Medication interaction with Plavix reviewed with clinical pharmacist and DOD.  I will transition him to Protonix, have him continue his physical activity, continue heart healthy low-sodium diet, and follow-up with Dr. [name] as scheduled.  Today he denies chest pain, shortness of breath, lower extremity edema, fatigue, palpitations, melena, hematuria, hemoptysis, diaphoresis, weakness, presyncope, syncope, orthopnea, and PND.   Home Medications    Prior to Admission medications   Medication Sig Start Date End Date Taking? Authorizing Provider  acetaminophen (TYLENOL) 500 MG tablet Take 1,000 mg by mouth every 6 (six) hours as needed for mild pain or headache.    [provider]  alendronate (FOSAMAX) 70 MG tablet Take 70 mg by mouth every Wednesday. Take with a full glass of water on an empty stomach.    [provider]  allopurinol (ZYLOPRIM) 100 MG tablet Take 200 mg by mouth daily.  03/26/18   [provider]  aspirin EC 81 MG EC tablet Take 1 tablet (81 mg total) by mouth daily. Swallow whole. 03/19/21 04/18/21  [place] Donnamarie Poag, DO  atorvastatin (LIPITOR) 80 MG tablet TAKE 1 TABLET BY MOUTH DAILY AT 6 PM Patient taking differently: Take 80 mg by mouth every evening. 07/19/20    [name], MD  clopidogrel (PLAVIX) 75 MG tablet Take 1 tablet (75 mg total) by mouth daily with breakfast. 03/19/21 06/17/21  [place] Eric J, DO  COLCRYS 0.6 MG tablet Take 0.6 mg by mouth daily as needed (FOR GOUT).  05/26/14   [provider]  ketotifen (ZADITOR) 0.025 % ophthalmic solution Place 1 drop into both eyes as needed for allergies.    [provider]  Light Mineral Oil-Mineral Oil (RETAINE MGD) 0.5-0.5 % EMUL Place 1 drop into both eyes in the morning, at noon, and at bedtime.    [provider]  loratadine (CLARITIN) 10 MG tablet Take 10 mg by mouth daily as needed for allergies.    [provider]  Multiple Vitamin (MULTIVITAMIN WITH MINERALS) TABS tablet Take 1 tablet by mouth daily. Centrum Silver    [provider]  nitroGLYCERIN (NITROSTAT) 0.4 MG SL tablet PLACE 1 TAB UNDER TONGUE EVERY 5 MINS AS NEEDED FOR CHEST PAIN,MAX 3 DOSES 03/19/21   Hilty, Nadean Corwin, MD  Omega-3 Fat Ac-Cholecalciferol (DRY EYE OMEGA BENEFITS/VIT D-3 PO) Take 1 capsule by mouth 3 (three) times daily.    [provider]  omeprazole (PRILOSEC) 40 MG capsule Take 40 mg by mouth daily before breakfast. 30-60 minutes prior to breakfast. 06/22/18   [provider]  sildenafil (REVATIO) 20 MG tablet Take 1 tablet by mouth as needed for erectile dysfunction.    [provider]  testosterone cypionate (DEPOTESTOSTERONE CYPIONATE) 200 MG/ML injection Inject 200 mg into the muscle See admin instructions. Every other week 01/29/21   [provider]  valsartan (DIOVAN) 160 MG tablet TAKE 1 TABLET BY MOUTH EVERY DAY Patient taking differently: Take 80 mg by mouth every evening. 11/07/20   Hilty, Nadean Corwin, MD  XARELTO 20 MG TABS tablet TAKE 1 TABLET BY MOUTH EVERY DAY WITH SUPPER Patient taking differently: Take 20 mg by mouth daily with supper. 03/01/20   Hilty, Nadean Corwin, MD    Family History    Family History  Problem Relation Age of  Onset  . Pancreatic cancer Mother   . Aortic aneurysm Maternal Grandmother   . Heart attack Maternal Grandfather   . Diabetes Paternal Grandfather   . CAD Paternal Grandfather    He indicated that his mother is deceased. He indicated that his father is deceased. He indicated that his maternal grandmother is deceased. He indicated that his maternal grandfather is deceased. He indicated that his paternal grandmother is deceased. He indicated that his paternal grandfather is deceased.  Social History    Social History   Socioeconomic History  . Marital status: Married    Spouse name: Not on file  . Number of children: 2  . Years of education: master's  . Highest education level: Not on file  Occupational History  . Occupation: Civil engineer, contracting    Comment: retired  Tobacco Use  . Smoking status: Never Smoker  . Smokeless tobacco: Never Used  Vaping Use  . Vaping Use: Never used  Substance and Sexual Activity  . Alcohol use: No  . Drug use: No  . Sexual activity: Yes    Birth control/protection: None  Other Topics Concern  . Not on file  Social History Narrative   Lives in [place] with spouse.   Retired Civil engineer, contracting   Social Determinants of Radio broadcast assistant Strain: Not on [employer] Insecurity: Not on file  Transportation Needs: Not on file  Physical Activity: Not on file  Stress: Not on file  Social Connections: Not on file  Intimate Partner Violence: Not on file     Review of Systems    General:  No chills, fever, night sweats or weight changes.  Cardiovascular:  No chest pain, dyspnea on exertion, edema, orthopnea, palpitations, paroxysmal nocturnal dyspnea. Dermatological: No rash, lesions/masses Respiratory: No cough, dyspnea Urologic: No hematuria, dysuria Abdominal:   No nausea, vomiting, diarrhea, bright red blood per rectum, melena, or hematemesis Neurologic:  No visual changes, wkns, changes in mental status. All other systems reviewed and  are otherwise negative except as noted above.  Physical Exam    VS:  BP 120/74 (BP Location: Right Arm, Patient Position: Sitting, Cuff Size: Normal)   Pulse (!) 56   Ht 6\' 1"  (1.854 m)   Wt 223 lb 6.4 oz (101.3 kg)   BMI 29.47 kg/m  , BMI Body mass index is 29.47 kg/m. GEN: Well nourished, well developed, in no acute distress. HEENT: normal. Neck: Supple, no JVD, carotid bruits, or masses. Cardiac: RRR, 2/6 systolic murmur heard along left sternal border consistent with VSD.  , rubs, or gallops. No clubbing, cyanosis, edema.  Radials/DP/PT  2+ and equal bilaterally.  Respiratory:  Respirations regular and unlabored, clear to auscultation bilaterally. GI: Soft, nontender, nondistended, BS + x 4. MS: no deformity or atrophy. Skin: warm and dry, no rash. Neuro:  Strength and sensation are intact. Psych: Normal affect.  Accessory Clinical Findings    Recent Labs: 03/17/2021: ALT 23; Magnesium 1.8; TSH 5.176 03/19/2021: BUN 14; Creatinine, Ser 1.14; Hemoglobin 11.4; Platelets 219; Potassium 4.0; Sodium 139   Recent Lipid Panel    Component Value Date/Time   CHOL 104 03/18/2021 0605   CHOL 139 09/20/2019 0933   CHOL 131 01/22/2015 0812   TRIG 50 03/18/2021 0605   TRIG 93 01/22/2015 0812   HDL 37 (L) 03/18/2021 0605   HDL 52 09/20/2019 0933   HDL 49 01/22/2015 0812   CHOLHDL 2.8 03/18/2021 0605   VLDL 10 03/18/2021 0605   LDLCALC 57 03/18/2021 0605   LDLCALC 74 09/20/2019 0933   LDLCALC 63 01/22/2015 0812    ECG personally reviewed by me today-junctional rhythm 56 bpm- No acute changes  Echocardiogram 03/17/2021 1. 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  Left ventricular ejection fraction, by estimation, is 55 to 60%. The left  ventricle has normal function. The left ventricle has no regional wall  motion abnormalities. There is mild  left ventricular hypertrophy. Left ventricular diastolic parameters were  normal.  2. RV-RA gradient 36 mmHg  suggesting at least mildly increased RVSP.  Right ventricular systolic function is normal. The right ventricular size  is normal.  3. Left atrial size was severely dilated.  4. The mitral valve is grossly normal. Trivial mitral valve  regurgitation.  5. The aortic valve is tricuspid. Aortic valve regurgitation is trivial.  Mild aortic valve sclerosis is present, with no evidence of aortic valve  stenosis.  6. Aortic dilatation noted. There is moderate dilatation of the aortic  root, measuring 44 mm.  7. Unable to estimate CVP.   Cardiac catheterization 03/18/2021  Prox RCA lesion is 60% stenosed.  1st Mrg-1 lesion is 95% stenosed.  1st Mrg-2 lesion is 10% stenosed.  A drug-eluting stent was successfully placed using a SYNERGY XD 2.75X12.  Post intervention, there is a 0% residual stenosis.  LPAV lesion is 60% stenosed.  Prox Cx to Mid Cx lesion is 50% stenosed.  Prox LAD to Mid LAD lesion is 60% stenosed.  Mid LAD lesion is 60% stenosed.  1. Moderate disease in the mid LAD that does not appear to be changed from last cath. These lesions do not appear to be flow limiting 2. Severe stenosis proximal segment of the first obtuse marginal branch just proximal to the old stent. Moderate mid and distal Circumflex stenosis.  3. Moderate disease in the small non-dominant RCA 4. Successful PTCA/DES x 1 obtuse marginal branch.   Recommendations: He will need DAPT with ASA and Plavix for one month then would stop ASA and continue Plavix along with the Xarelto.   Diagnostic Dominance: Left    Intervention       Assessment & Plan   1.  Chest pain/coronary artery disease-no chest pain today.  Underwent successful cardiac catheterization with PCI and DES x1 to his first marginal,on 03/18/2021.  Details above.  Placed on triple therapy for 30 days with plan to discontinue/stop aspirin after 30 days.  No BB due to bradycardia Continue aspirin, Plavix, valsartan Heart  healthy low-sodium diet-salty 6 given Increase physical activity as tolerated  Atrial fibrillation status post ablation- denies increased or  irregular heartbeats.  Reports compliance with Xarelto.  Denies bleeding issues.  No BB due to bradycardia. Continue Xarelto Heart healthy low-sodium diet-salty 6 given Increase physical activity as tolerated Avoid triggers caffeine, chocolate, EtOH, dehydration etc.  Hyperlipidemia-03/18/2021: Cholesterol 104; HDL 37; LDL Cholesterol 57; Triglycerides 50; VLDL 10 Continue atorvastatin Heart healthy low-sodium high-fiber diet Increase physical activity as tolerated  Aortic root dilation- echocardiogram showed 44 mm dilation on echocardiogram.  Previous echocardiogram 8/21 showed 41 mm dilation Repeat echocardiogram 5/23  VSD- echocardiogram noted left-to-right shunting.  Trivial aortic valve regurgitation noted Continue to monitor  GERD-we will stop Prilosec due to interaction with Plavix. Start pantoprazole 40 mg daily GERD diet   Disposition: Follow-up with Dr. [name] in 3 months.  Jossie Ng. Rainn Zupko NP-C    04/04/2021, 11:51 AM [address] Office [phone] Fax [phone]  Notice: This dictation was prepared with Dragon dictation along with smaller phrase technology. Any transcriptional errors that result from this process are unintentional and may not be corrected upon review.  I spent 15 minutes examining this patient, reviewing medications, and using patient centered shared decision making involving her cardiac care.  Prior to her visit I spent greater than 20 minutes reviewing her past medical history,  medications, and prior cardiac tests.

## 2021-04-04 ENCOUNTER — Other Ambulatory Visit: Payer: Self-pay

## 2021-04-04 ENCOUNTER — Ambulatory Visit (INDEPENDENT_AMBULATORY_CARE_PROVIDER_SITE_OTHER): Payer: Medicare Other | Admitting: General Practice

## 2021-04-04 ENCOUNTER — Encounter: Payer: Self-pay | Admitting: General Practice

## 2021-04-04 VITALS — BP 120/74 | HR 56 | Ht 73.0 in | Wt 223.4 lb

## 2021-04-04 DIAGNOSIS — K2100 Gastro-esophageal reflux disease with esophagitis, without bleeding: Secondary | ICD-10-CM | POA: Diagnosis not present

## 2021-04-04 DIAGNOSIS — E785 Hyperlipidemia, unspecified: Secondary | ICD-10-CM

## 2021-04-04 DIAGNOSIS — I2510 Atherosclerotic heart disease of native coronary artery without angina pectoris: Secondary | ICD-10-CM | POA: Diagnosis not present

## 2021-04-04 DIAGNOSIS — I480 Paroxysmal atrial fibrillation: Secondary | ICD-10-CM

## 2021-04-04 DIAGNOSIS — I77810 Thoracic aortic ectasia: Secondary | ICD-10-CM

## 2021-04-04 MED ORDER — PANTOPRAZOLE SODIUM 40 MG PO TBEC: 40.0000 mg | DELAYED_RELEASE_TABLET | Freq: Every day | ORAL | 12 refills | Status: DC

## 2021-04-04 NOTE — Patient Instructions (Addendum)
Medication Instructions:  STOP OMEPRAZOLE  START PANTOPRAZOLE 40MG  DAILY  *If you need a refill on your cardiac medications before your next appointment, please call your pharmacy*  Lab Work:   Testing/Procedures:  NONE    NONE  Special Instructions PLEASE READ AND FOLLOW SALTY 6-ATTACHED-1,800mg  daily  PLEASE MAINTAIN PHYSICAL ACTIVITY AS TOLERATED  Follow-Up: Your next appointment:  KEEP SCHEDULED APPOINTMENT In Person with K. Mali Hilty, MD   At [facility], you and your health needs are our priority.  As part of our continuing mission to provide you with exceptional heart care, we have created designated Provider Care Teams.  These Care Teams include your primary Cardiologist (physician) and Advanced Practice Providers (APPs -  Physician Assistants and Nurse Practitioners) who all work together to provide you with the care you need, when you need it.            6 SALTY THINGS TO AVOID     1,800MG  DAILY

## 2021-04-08 ENCOUNTER — Encounter: Payer: Medicare Other | Attending: Family Medicine | Admitting: Dietician

## 2021-04-08 ENCOUNTER — Other Ambulatory Visit: Payer: Self-pay

## 2021-04-08 DIAGNOSIS — R7303 Prediabetes: Secondary | ICD-10-CM | POA: Insufficient documentation

## 2021-04-08 NOTE — Progress Notes (Signed)
On 04/08/2021 patient completed a post core session of the Diabetes Prevention Program course virtually with [facility]. By the end of this session patients are able to complete the following objectives:   Virtual Visit via Video Note  I connected with Jose Stone, 1946-08-13 on 04/08/21 at 10:30 AM EDT by a video enabled application and verified that I am speaking with the correct person using two identifiers.  Location: Patient: Virtual Provider: Office  Learning Objectives:  List risk factors for heart disease.   Define the difference between HDL and LDL cholesterol  List ways to reduce risk for heart disease.   Goals:   Record weight taken outside of class.   Track foods and beverages eaten each day in the "Food and Activity Tracker," including calories and fat grams for each item.    Track activity type, minutes you were active, and distance you reached each day in the "Food and Activity Tracker."   Follow-Up Plan:  Attend next session.   Email completed "Food and Activity Trackers" before next session to be reviewed by Lifestyle Coach.

## 2021-04-11 DIAGNOSIS — I4891 Unspecified atrial fibrillation: Secondary | ICD-10-CM | POA: Diagnosis not present

## 2021-04-11 DIAGNOSIS — I10 Essential (primary) hypertension: Secondary | ICD-10-CM | POA: Diagnosis not present

## 2021-04-11 DIAGNOSIS — E785 Hyperlipidemia, unspecified: Secondary | ICD-10-CM | POA: Diagnosis not present

## 2021-04-11 DIAGNOSIS — I2510 Atherosclerotic heart disease of native coronary artery without angina pectoris: Secondary | ICD-10-CM | POA: Diagnosis not present

## 2021-04-11 DIAGNOSIS — K219 Gastro-esophageal reflux disease without esophagitis: Secondary | ICD-10-CM | POA: Diagnosis not present

## 2021-04-16 DIAGNOSIS — Z20822 Contact with and (suspected) exposure to covid-19: Secondary | ICD-10-CM | POA: Diagnosis not present

## 2021-04-16 DIAGNOSIS — N529 Male erectile dysfunction, unspecified: Secondary | ICD-10-CM | POA: Insufficient documentation

## 2021-04-16 DIAGNOSIS — M109 Gout, unspecified: Secondary | ICD-10-CM | POA: Insufficient documentation

## 2021-04-16 DIAGNOSIS — J209 Acute bronchitis, unspecified: Secondary | ICD-10-CM | POA: Diagnosis not present

## 2021-04-16 DIAGNOSIS — Q210 Ventricular septal defect: Secondary | ICD-10-CM | POA: Diagnosis not present

## 2021-04-16 DIAGNOSIS — Z6829 Body mass index (BMI) 29.0-29.9, adult: Secondary | ICD-10-CM | POA: Diagnosis not present

## 2021-04-16 DIAGNOSIS — R059 Cough, unspecified: Secondary | ICD-10-CM | POA: Diagnosis not present

## 2021-04-16 DIAGNOSIS — I2510 Atherosclerotic heart disease of native coronary artery without angina pectoris: Secondary | ICD-10-CM | POA: Diagnosis not present

## 2021-04-16 DIAGNOSIS — I10 Essential (primary) hypertension: Secondary | ICD-10-CM | POA: Diagnosis not present

## 2021-04-16 DIAGNOSIS — E538 Deficiency of other specified B group vitamins: Secondary | ICD-10-CM | POA: Insufficient documentation

## 2021-04-16 DIAGNOSIS — D649 Anemia, unspecified: Secondary | ICD-10-CM | POA: Insufficient documentation

## 2021-04-22 ENCOUNTER — Encounter (HOSPITAL_BASED_OUTPATIENT_CLINIC_OR_DEPARTMENT_OTHER): Payer: Medicare Other | Admitting: Dietician

## 2021-04-22 ENCOUNTER — Other Ambulatory Visit: Payer: Self-pay

## 2021-04-22 DIAGNOSIS — R7303 Prediabetes: Secondary | ICD-10-CM

## 2021-04-22 NOTE — Progress Notes (Signed)
On 04/22/2021 patient completed a post core session of the Diabetes Prevention Program course virtually with [facility]. By the end of this session patients are able to complete the following objectives:   I connected with Jose Stone, Mar 01, 1946 by a video enabled application and verified that I am speaking with the correct person using two identifiers.  Location: Patient: Virtual Provider: Office  Learning Objectives: Describe how to incorporate more fruits and vegetables into meals. List criteria for selecting good quality fruits and vegetables at the store.  Define mindful eating. List the benefits of eating mindfully.   Goals:  Record weight taken outside of class.  Track foods and beverages eaten each day in the "Food and Activity Tracker," including calories and fat grams for each item.   Track activity type, minutes you were active, and distance you reached each day in the "Food and Activity Tracker."   Follow-Up Plan: Attend next session.  Email completed "Food and Activity Trackers" before next session to be reviewed by Lifestyle Coach.

## 2021-04-29 ENCOUNTER — Ambulatory Visit: Payer: Medicare Other | Admitting: Podiatry

## 2021-04-29 DIAGNOSIS — D649 Anemia, unspecified: Secondary | ICD-10-CM | POA: Diagnosis not present

## 2021-04-29 DIAGNOSIS — Z79899 Other long term (current) drug therapy: Secondary | ICD-10-CM | POA: Diagnosis not present

## 2021-05-07 ENCOUNTER — Other Ambulatory Visit: Payer: Self-pay

## 2021-05-07 ENCOUNTER — Encounter: Payer: Self-pay | Admitting: Podiatry

## 2021-05-07 ENCOUNTER — Telehealth: Payer: Self-pay

## 2021-05-07 ENCOUNTER — Ambulatory Visit (INDEPENDENT_AMBULATORY_CARE_PROVIDER_SITE_OTHER): Payer: Medicare Other | Admitting: Podiatry

## 2021-05-07 DIAGNOSIS — L84 Corns and callosities: Secondary | ICD-10-CM

## 2021-05-07 DIAGNOSIS — D689 Coagulation defect, unspecified: Secondary | ICD-10-CM

## 2021-05-07 MED ORDER — RIVAROXABAN 20 MG PO TABS: 20.0000 mg | ORAL_TABLET | Freq: Every day | ORAL | 1 refills | Status: DC

## 2021-05-07 NOTE — Telephone Encounter (Signed)
40m, 101.3kg, scr 1.14 03/19/21, lovw/cleaver 04/04/21, ccr 82.7

## 2021-05-08 ENCOUNTER — Telehealth (HOSPITAL_COMMUNITY): Payer: Self-pay

## 2021-05-08 ENCOUNTER — Encounter (HOSPITAL_COMMUNITY): Payer: Self-pay

## 2021-05-08 NOTE — Telephone Encounter (Signed)
Attempted to call patient in regards to Cardiac Rehab - LM on VM Mailed letter 

## 2021-05-10 NOTE — Progress Notes (Signed)
Subjective: 75 year old male presents the office today for follow-up evaluation of a pre-ulcerative callus on the left foot.  She states that her treatment is often causing bleeding.  There is blood in the cast today.  Denies any swelling or redness in the month.   He is currently on Plavix as well as Xarelto and had a recent cardiac stent placed.  Objective: AAO x3, NAD DP/PT pulses palpable bilaterally, CRT less than 3 seconds Hyperkeratotic tissue with evidence of dried blood.  Upon debridement there is no underlying ulceration drainage or any signs of infection.  There is no surrounding erythema, ascending cellulitis but there is no fluctuation crepitation but there is no malodor.  No signs of infection. No pain with calf compression, swelling, warmth, erythema  Assessment: Preulcerative callus left hallux IPJ; anticoagulation  Plan: -All treatment options discussed with the patient including all alternatives, risks, complications.  -Debrided the hyperkeratotic tissue with any complications or bleeding.  Recommend moisturizer daily.  Continue offloading.  Monitor for skin breakdown or any recurrence of infection.  Recommended not to try to trim the callus himself. -Continue inserts that he has well prevent pronation.  Trula Slade DPM

## 2021-05-13 ENCOUNTER — Other Ambulatory Visit: Payer: Self-pay | Admitting: Family Medicine

## 2021-05-13 ENCOUNTER — Other Ambulatory Visit: Payer: Self-pay

## 2021-05-13 ENCOUNTER — Ambulatory Visit
Admission: RE | Admit: 2021-05-13 | Discharge: 2021-05-13 | Disposition: A | Discharge status: Home / Self Care | Payer: Medicare Other | Source: Ambulatory Visit | Attending: Family Medicine | Admitting: Family Medicine

## 2021-05-13 DIAGNOSIS — S59911A Unspecified injury of right forearm, initial encounter: Secondary | ICD-10-CM | POA: Diagnosis not present

## 2021-05-13 DIAGNOSIS — M7989 Other specified soft tissue disorders: Secondary | ICD-10-CM | POA: Diagnosis not present

## 2021-05-13 DIAGNOSIS — W19XXXA Unspecified fall, initial encounter: Secondary | ICD-10-CM

## 2021-05-20 ENCOUNTER — Other Ambulatory Visit: Payer: Self-pay

## 2021-05-20 ENCOUNTER — Encounter: Payer: Self-pay | Admitting: Dietician

## 2021-05-20 ENCOUNTER — Encounter: Payer: Medicare Other | Attending: Family Medicine | Admitting: Dietician

## 2021-05-20 DIAGNOSIS — R7303 Prediabetes: Secondary | ICD-10-CM | POA: Insufficient documentation

## 2021-05-20 NOTE — Progress Notes (Signed)
On 05/20/2021 patient completed the Diabetes Prevention Program course virtually with [facility]. By the end of this session patients are able to complete the following objectives:   Virtual Visit via Video Note  I connected with Jose Stone, 1946/09/22 by a video enabled application and verified that I am speaking with the correct person using two identifiers.  Location: Patient: Virtual Provider: Office  Learning Objectives: Define fiber and describe the difference between insoluble and soluble fiber  List foods that are good sources of fiber Explain the health benefits of fiber  Describe ways to increase volume of meals and snacks while staying within fat goal.   Goals:  Record weight taken outside of class.  Track foods and beverages eaten each day in the "Food and Activity Tracker," including calories and fat grams for each item.   Track activity type, minutes you were active, and distance you reached each day in the "Food and Activity Tracker."   Follow-Up Plan: Attend next session.  Email completed "Food and Activity Trackers" before next session to be reviewed by Lifestyle Coach.

## 2021-05-21 DIAGNOSIS — Z23 Encounter for immunization: Secondary | ICD-10-CM | POA: Diagnosis not present

## 2021-05-22 DIAGNOSIS — R350 Frequency of micturition: Secondary | ICD-10-CM | POA: Diagnosis not present

## 2021-05-22 DIAGNOSIS — E291 Testicular hypofunction: Secondary | ICD-10-CM | POA: Diagnosis not present

## 2021-05-22 DIAGNOSIS — R3121 Asymptomatic microscopic hematuria: Secondary | ICD-10-CM | POA: Diagnosis not present

## 2021-05-24 ENCOUNTER — Telehealth (HOSPITAL_COMMUNITY): Payer: Self-pay

## 2021-05-24 NOTE — Telephone Encounter (Signed)
No repsonse from pt.  Closed referral  

## 2021-05-27 ENCOUNTER — Other Ambulatory Visit: Payer: Self-pay

## 2021-05-27 ENCOUNTER — Encounter (HOSPITAL_BASED_OUTPATIENT_CLINIC_OR_DEPARTMENT_OTHER): Payer: Medicare Other | Admitting: Dietician

## 2021-05-27 DIAGNOSIS — R7303 Prediabetes: Secondary | ICD-10-CM

## 2021-05-27 NOTE — Progress Notes (Signed)
On 05/27/2021 patient completed a post core session of the Diabetes Prevention Program course virtually with [facility]. By the end of this session patients are able to complete the following objectives:   Virtual Visit via Video Note  I connected with Hilaria Ota, 12-02-1945 by a video enabled application and verified that I am speaking with the correct person using two identifiers.  Location: Patient: Virtual Provider: Office  Learning Objectives: List ways to make recipes healthier.  List lower-fat and lower-calorie substitutions for common ingredients.  Identify low-fat cooking methods.  Describe how to choose a cookbook that works best for their needs.   Goals:  Record weight taken outside of class.  Track foods and beverages eaten each day in the "Food and Activity Tracker," including calories and fat grams for each item.   Track activity type, minutes you were active, and distance you reached each day in the "Food and Activity Tracker."   Follow-Up Plan: Attend next session.  Email completed "Food and Activity Trackers" before next session to be reviewed by Lifestyle Coach.

## 2021-05-29 DIAGNOSIS — R3121 Asymptomatic microscopic hematuria: Secondary | ICD-10-CM | POA: Diagnosis not present

## 2021-05-29 DIAGNOSIS — K7689 Other specified diseases of liver: Secondary | ICD-10-CM | POA: Diagnosis not present

## 2021-05-29 DIAGNOSIS — N281 Cyst of kidney, acquired: Secondary | ICD-10-CM | POA: Diagnosis not present

## 2021-06-04 ENCOUNTER — Other Ambulatory Visit: Payer: Self-pay | Admitting: Internal Medicine

## 2021-06-10 ENCOUNTER — Other Ambulatory Visit: Payer: Self-pay

## 2021-06-10 ENCOUNTER — Encounter: Payer: Medicare Other | Attending: Family Medicine | Admitting: Dietician

## 2021-06-10 DIAGNOSIS — R7303 Prediabetes: Secondary | ICD-10-CM | POA: Diagnosis not present

## 2021-06-10 NOTE — Progress Notes (Signed)
On 06/10/2021 patient attended a virtual grocery store tour session as part of the Diabetes Prevention Program with [facility].  Virtual Visit via Video Note  I connected with Jose Stone, [birth year] by a video enabled application and verified that I am speaking with the correct person using two identifiers.  Location: Patient: Virtual Provider: Office   Learning Objectives: Develop a plan for our grocery shopping experience Putting together a list How to navigate the grocery store Identify 4 main sections of the grocery store Produce Meat/Poultry/Fish Dairy  Inside Aisles Consider tips for shopping in each of these four sections Reflect on our own shopping habits Create a new goal for our next grocery shopping experience Engage in a group discussion  Goals:  Record weight taken outside of class.  Track foods and beverages eaten each day in the "Food and Activity Tracker," including calories and fat grams for each item.  Create one new goal for the next grocery shopping experience based on the information provided today  Follow-Up Plan: Attend next session.  Email completed "Food and Activity Tracker" before next session to be reviewed by Lifestyle Coach.

## 2021-06-11 MED ORDER — CLOPIDOGREL BISULFATE 75 MG PO TABS: 75.0000 mg | ORAL_TABLET | Freq: Every day | ORAL | 3 refills | Status: AC

## 2021-06-11 NOTE — Telephone Encounter (Signed)
Yes .. he is my patient, ok to refill  Dr Lemmie Evens

## 2021-07-09 DIAGNOSIS — Z1389 Encounter for screening for other disorder: Secondary | ICD-10-CM | POA: Diagnosis not present

## 2021-07-09 DIAGNOSIS — Z0000 Encounter for general adult medical examination without abnormal findings: Secondary | ICD-10-CM | POA: Diagnosis not present

## 2021-07-15 ENCOUNTER — Other Ambulatory Visit: Payer: Self-pay

## 2021-07-15 ENCOUNTER — Encounter: Payer: Medicare Other | Attending: Family Medicine | Admitting: Dietician

## 2021-07-15 DIAGNOSIS — R7303 Prediabetes: Secondary | ICD-10-CM | POA: Diagnosis not present

## 2021-07-15 NOTE — Progress Notes (Signed)
On 07/15/2021 patient completed a post core session of the Diabetes Prevention Program virtually with [facility]. By the end of this session patients are able to complete the following objectives:   Virtual Visit via Video Note  I connected with Jose Stone, 01-02-1946 by a video enabled application and verified that I am speaking with the correct person using two identifiers.  Location: Patient: Virtual Provider: Office  Learning Objectives: List indoor physical activity options.  Identify any barriers to being active and brainstorm how to overcome barriers.  Describe short and long-term health benefits of physical activity.   Goals:  Record weight taken outside of class.  Track foods and beverages eaten each day in the "Food and Activity Tracker," including calories and fat grams for each item.   Track activity type, minutes you were active, and distance you reached each day in the "Food and Activity Tracker."   Follow-Up Plan: Attend next session.  Email completed "Food and Activity Trackers" before next session to be reviewed by Lifestyle Coach.

## 2021-07-19 ENCOUNTER — Encounter: Payer: Self-pay | Admitting: Internal Medicine

## 2021-07-19 ENCOUNTER — Other Ambulatory Visit: Payer: Self-pay

## 2021-07-19 ENCOUNTER — Ambulatory Visit (INDEPENDENT_AMBULATORY_CARE_PROVIDER_SITE_OTHER): Payer: Medicare Other | Admitting: Internal Medicine

## 2021-07-19 VITALS — BP 132/72 | HR 56 | Ht 73.0 in | Wt 226.0 lb

## 2021-07-19 DIAGNOSIS — E291 Testicular hypofunction: Secondary | ICD-10-CM | POA: Diagnosis not present

## 2021-07-19 DIAGNOSIS — I480 Paroxysmal atrial fibrillation: Secondary | ICD-10-CM

## 2021-07-19 DIAGNOSIS — I2510 Atherosclerotic heart disease of native coronary artery without angina pectoris: Secondary | ICD-10-CM | POA: Diagnosis not present

## 2021-07-19 DIAGNOSIS — I77810 Thoracic aortic ectasia: Secondary | ICD-10-CM

## 2021-07-19 DIAGNOSIS — Q210 Ventricular septal defect: Secondary | ICD-10-CM

## 2021-07-19 DIAGNOSIS — R3121 Asymptomatic microscopic hematuria: Secondary | ICD-10-CM | POA: Diagnosis not present

## 2021-07-19 NOTE — Patient Instructions (Signed)
Medication Instructions:  Your physician recommends that you continue on your current medications as directed. Please refer to the Current Medication list given to you today.  *If you need a refill on your cardiac medications before your next appointment, please call your pharmacy*   Lab Work: BMET prior to CT test in May   If you have labs (blood work) drawn today and your tests are completely normal, you will receive your results only by: [place] (if you have MyChart) OR A paper copy in the mail If you have any lab test that is abnormal or we need to change your treatment, we will call you to review the results.   Testing/Procedures: CT angiogram chest/aorta in May 2023 @ [facility]   Follow-Up: At [facility], you and your health needs are our priority.  As part of our continuing mission to provide you with exceptional heart care, we have created designated Provider Care Teams.  These Care Teams include your primary Cardiologist (physician) and Advanced Practice Providers (APPs -  Physician Assistants and Nurse Practitioners) who all work together to provide you with the care you need, when you need it.  We recommend signing up for the patient portal called "MyChart".  Sign up information is provided on this After Visit Summary.  MyChart is used to connect with patients for Virtual Visits (Telemedicine).  Patients are able to view lab/test results, encounter notes, upcoming appointments, etc.  Non-urgent messages can be sent to your provider as well.   To learn more about what you can do with MyChart, go to [email or web address].    Your next appointment:   May 2023 - after CT  The format for your next appointment:   In Person  Provider:   You may see [name], MD or one of the following Advanced Practice Providers on your designated Care Team:   Almyra Deforest, PA-C Fabian Sharp, PA-C or  Roby Lofts, [place]   Other Instructions

## 2021-07-19 NOTE — Progress Notes (Signed)
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 This is never caused issues in fact she's had right and left heart catheterizations dating back to the 1970s. 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 Other coronary risk factors include hypertension, dyslipidemia and family history of heart disease. He was sent for cardiac catheterization in 2006 for follow-up of VSD, at which time he had an echocardiogram which showed mild aortic regurgitation, a small membranous VSD and left atrial enlargement with an EF of 70%. He had a stress test at that time which showed findings concerning for him for a septal and inferoapical ischemia. He therefore was referred to left heart catheterization with strep demonstrated a 50-70% mid LAD stenosis and mild disease of the mid circumflex and first obtuse marginal branches. He's been managed medically since that time and previously saw Dr. Daneen Schick.  Recently he's been having some symptoms of sweating, palpitations and dizziness and eventually was found to be in atrial fibrillation when he presented to the hospital. At that time his troponin was elevated. He is placed on diltiazem which slowed his heart rate and he converted back to sinus spontaneously. Based on his elevated troponin, he was referred for left heart catheterization by Dr. Doylene Canard. This demonstrated a 60-70% mid LAD stenosis and a 90% obtuse marginal stenosis. Subsequently he underwent coronary intervention with placement of a 2.5 x 15 mm Xience Alpine stent to the mid OM. After this he became asymptomatic. He was going to have stress testing to determine the significance of his mid LAD lesion however it appears to be not significantly changed since his  heart catheterization 2006. He reports no further symptoms of atrial fibrillation but is concerned about bradycardia. He was not referred for cardiac rehabilitation due to the thought that he may need additional intervention to the LAD.  Jose Stone returns today for follow-up. She's done quite well in cardiac rehabilitation. At his last office visit we discontinued his diltiazem for bradycardia. He still has a low heart rate which has some degree of chronotropic incompetence. He reports no recurrent atrial fibrillation that were aware of. He continues on dual antiplatelet therapy. He had a stress test prior to enrolling in cardiac redilatation which was negative for ischemia.  I saw Jose Stone back in the office today. He is accompanied by his wife who was a cardiac nurse at the [employer] in [place] for a number years in the 1970s. They brought in pronounced of his heart rate monitor and had several questions about his heart rate and arrhythmias. My interpretation of the monitor indicates that he had some episodes of short bursts of atrial tachycardia but no evidence for A. fib. He also has PACs. Heart rate however was a low and remained that way despite stopping his calcium channel blocker. He was recently in the emergency room and was noted to have some paroxysmal tachycardia in the ER with a right bundle-branch pattern. This is what led to monitor placement. He reports fatigue which could be certainly related to his lower heart rate. He also says that he's developed gynecomastia which he relates to [place]. I've done a search on the medicine and do not see that however he reports  in the product labeling.  03/21/2016  Jose Stone was seen today in the office for an acute visit. He noted this morning shortly after waking up his heart rate was elevated in the low 100s. It improved somewhat and then around 9:00 it increased up to about 130. The heart rate is been stable since then and he called in for a new acute  triage visit. He reports feeling somewhat fatigued and noting that his heart rate is elevated. He denies any worsening shortness of breath or chest pain. EKG in the office shows atrial flutter with a regular rhythm at 131 bpm. As per recent notes he was previous on aspirin and clopidogrel but the caput ago was stopped. He was not on triple therapy. Currently he is only on low-dose aspirin. 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 I'm concerned about up titrating his medications because she's had history of slow ventricular rates in the past. We discussed a number of options but ultimately the best option is for him to undergo cardioversion to get back into normal rhythm.  04/17/2016  Jose Stone was seen back in the office today for follow-up. At his last office visit he was noted to be in atrial flutter with a rate of 131. He was referred directly to the ER and underwent cardioversion after being started on Xarelto. He is remained on Xarelto since then without any bleeding problems. I'm happy to say he is maintaining sinus rhythm today. He does report significant fatigue however. He is mostly compliant with CPAP however he had a sleep study a number of years ago and he is set possibly to low of an airway setting. He's had weight gain since then. He also has been having some bradycardia with heart rates in the 40s and 50s. He is on low-dose beta blocker but I been hesitant to stop that because of his RVR. He does report heart rate increases with exercise.   06/20/2016  Jose Stone returns today for follow-up. He underwent a sleep study on 06/05/2016. He did have a diagnosis of sleep apnea however that study indicated a very low AHI of 2.5 events per hour. He had actually about 25 events per hour in rem sleep, but overall the REM sleep latency was only about 8%. Ultimately he was diagnosis not having any significant apnea. This is led to some confusion as he previously was  wearing [place] CPAP device. He reports no change in his symptoms of fatigue which we thought could be related to sleep apnea. It also may be related to bradycardia and his beta blocker.  09/23/2016  Jose Stone comes back today and is doing well. His fatigue has improved somewhat with discontinuing his B-Blocker. Heartrate is increased. I spoke with Dr. Claiborne Billings about the possibility of OSA and he is not felt to have this. Dr. Claiborne Billings noted that RLS was present. He does not desire treatment for this. Blood pressure was somewhat elevated today. EKG shows sinus with PAC's. I reviewed a spreadsheet of his blood pressures today.  03/31/2017  Jose Stone was seen today in follow-up. Overall he is doing very well. He denies any chest pain worsening shortness of breath. Blood pressure is well-controlled. He brought in a graft indicating excellent control blood pressure and heart rate. He said no breakthrough A. fib. His recent lab work was provided by his PCP. Total cholesterol 134, trig Richards 85, HDL-C 49, LDL-C 68, non-HDL of 85. This represents excellent control. He is  planning a trip out [place] is been provided some Diamox to use as needed. He denies any bleeding problems on Xarelto.  09/03/2017  Jose Stone returns today for follow-up.  He underwent a recent stress test after I saw him in the emergency department for chest pain.  At the time he took some nitroglycerin which resulted in improvement in his symptoms however ruled out for MI by negative troponins.  His stress test demonstrated a small area of reversible ischemia in the inferolateral wall.  It was interpreted as intermediate risk.  Since then he has had no further chest pain.  I discussed the findings with him today and we talked about various options including medical therapy and/or repeat cardiac catheterization.  As he remains asymptomatic, I do not feel that medication adjustments necessary at this time.  Finally, he notes that he has had some  morning hypertension which is more significant than his daytime readings.  10/15/2017  Bradycardia returns today for follow-up.  As previously noted he had a mildly abnormal stress test and was having some chest discomfort.  That is completely resolved.  We have moved around his medications a little bit but he notes that his blood pressure still remains elevated at times during the day.  Echo was performed on 10/01/2017 which showed LVEF that was higher at 60-65%, stable dilated aortic root at 4.4 cm and the ascending aorta measured 4.0 cm.  No regional wall motion abnormalities were noted.  01/15/2018  Jose Stone returns today for follow-up of his blood pressure.  He reports after adjusting his medicines that in general his blood pressures been well controlled.  He denies any recurrent episodes of dizziness or lightheadedness.  Recently had some testing of testosterone.  He has had a number of urologic issues and possible damage to blood flow to 1 of the testicles.  That coupled with age I suspect his lead to low testosterone.  Recently as low as the 200s.  His urologist Dr. Junious Silk, is recommending supplemental testosterone.  He is asking for my cardiac opinion whether this is safe to use.  07/19/2018  Jose Stone returns today for hospital follow-up.  Fortunately was seen in the ER for acute onset A. fib with RVR.  This is in the setting of what appears to be marked dehydration.  He was working outside in the heat and developed acute renal failure.  Potassium was up to 6.1.  He responded to hydration and repeat renal profile last week showed that his creatinine returned to baseline.  Potassium was actually slightly low at 3.3.  He is not on repletion.  I stopped his chlorthalidone and reduced his ARB to 160 mg valsartan daily.  He denies any recurrent A. Fib.  11/23/2018  Jose Stone seen today for labile blood pressures.  He recently messaged the office and was noted to have elevated blood pressures.   Previously had been on higher dose blood pressure medication however this was reduced including stopping chlorthalidone and reducing his ARB because of hyperkalemia.  The decrease in ARB probably was the reason why his blood pressures have remained elevated.  His wife is also concerned about a wider pulse pressure which at times can be close to 100 mmHg.  Despite this diastolics have not been below 50.  EKG shows sinus rhythm with sinus arrhythmia incomplete right bundle branch today at 66.  02/22/2020  Jose Stone seen today in follow-up.  Overall he seems to be doing well.  He had some  recent surgery including a brow/lid lift.  He is also had a toe fusion.  Of note, he was noted to be tachycardic today and he seemed to be unaware of this.  EKG was performed which showed A. fib with RVR.  He has had this in the past and is spontaneously converted, in fact once we set him up for cardioversion.  He is on Xarelto and low-dose diltiazem.  He is scheduled to have an MRI tomorrow of his shoulder with a plan for upcoming shoulder surgery.  05/10/2020  Jose Stone seen today in follow-up.  EKG shows he is maintaining sinus rhythm.  He underwent successful left shoulder surgery.  He is slow to recover from this and still has some pain.  Blood pressure is elevated today.  He was off of Xarelto for short period of time but then restarted it.  I also readjusted his medications including increasing his diltiazem and decreasing the valsartan.  If possible we may need to go back up on the valsartan however I would like for him to monitor his blood pressure.  07/19/2021  Jose Stone is seen today in follow-up.  He seems to be doing well still after PCI in May of this year.  He is transition from aspirin Plavix and Xarelto to Plavix and Xarelto which we will continue at least until May 2023.  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  He does have some aortic insufficiency which we will continue to follow.  He will be due for repeat CT again next year.  He denies any chest pain.  EKG shows sinus bradycardia with some PVCs today.  Blood pressure is well controlled.  Lipids are at goal as of May showing total cholesterol 104, HDL 37, triglycerides 50 and LDL 57.  PMHx:  Past Medical History:  Diagnosis Date   Aortic root enlargement (HCC)    CAD (coronary artery disease)    Complication of anesthesia    Hypotensive with general anesth.   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    Ventricular septal defect     Past Surgical History:  Procedure Laterality Date   ATRIAL FIBRILLATION ABLATION N/A 07/20/2020   Procedure: ATRIAL FIBRILLATION ABLATION;  Surgeon: [name], MD;  Location: [place] [facility];  Service: Cardiovascular;  Laterality: N/A;   CARDIAC CATHETERIZATION     CARDIOVERSION N/A 02/29/2020   Procedure: CARDIOVERSION;  Surgeon: [name], MD;  Location: [place];  Service: Cardiovascular;  Laterality: N/A;   CARDIOVERSION N/A 06/28/2020   Procedure: CARDIOVERSION;  Surgeon: Thayer Headings, MD;  Location: [facility] ENDOSCOPY;  Service: Cardiovascular;  Laterality: N/A;   CORONARY STENT INTERVENTION N/A 03/18/2021   Procedure: CORONARY STENT INTERVENTION;  Surgeon: Burnell Blanks, MD;  Location: [place] CV LAB;  Service: Cardiovascular;  Laterality: N/A;   DISTAL INTERPHALANGEAL JOINT FUSION Left 01/19/2019   Procedure: HALLUX INTERPHANGEAL JOINT FUSION LEFT FOOT;  Surgeon: Trula Slade, DPM;  Location: [place];  Service: Podiatry;  Laterality: Left;  LEFT BLOCK   hipospedious     LEFT HEART CATH AND CORONARY ANGIOGRAPHY N/A 03/18/2021  Procedure: LEFT HEART CATH  AND CORONARY ANGIOGRAPHY;  Surgeon: Burnell Blanks, MD;  Location: [place] CV [facility];  Service: Cardiovascular;  Laterality: N/A;   LEFT HEART CATHETERIZATION WITH CORONARY ANGIOGRAM N/A 08/14/2014   Procedure: LEFT HEART CATHETERIZATION WITH CORONARY ANGIOGRAM;  Surgeon: Birdie Riddle, MD;  Location: [place] CATH LAB;  Service: Cardiovascular;  Laterality: N/A;   MASS EXCISION Left 01/19/2019   Procedure: EXCISION BENIGN LESION 2.OCM LEFT FOOT;  Surgeon: Trula Slade, DPM;  Location: [place];  Service: Podiatry;  Laterality: Left;   SHOULDER ARTHROSCOPY WITH SUBACROMIAL DECOMPRESSION Left 05/03/2020   Procedure: SHOULDER ARTHROSCOPY DEBRIDEMENT PARTIAL ROTATOR CUFF REPAIR, SUBACROMIAL DECOMPRESSION, BICEPS TENOTOMY;  Surgeon: Tania Ade, MD;  Location: [facility];  Service: Orthopedics;  Laterality: Left;   SHOULDER SURGERY     TONSILLECTOMY  1953    FAMHx:  Family History  Problem Relation Age of Onset   Pancreatic cancer Mother    Aortic aneurysm Maternal Grandmother    Heart attack Maternal Grandfather    Diabetes Paternal Grandfather    CAD Paternal Grandfather     SOCHx:   reports that he has never smoked. He has never used smokeless tobacco. He reports that he does not drink alcohol and does not use drugs.  ALLERGIES:  Allergies  Allergen Reactions   Ciprofloxacin     Other reaction(s): bulging aorta   Codeine Nausea Only    Can tolerate with benadryl   Diltiazem Cd [Diltiazem Hcl]     Other reaction(s): Bradycardia   Mupirocin Itching   Neosporin [Bacitracin-Polymyxin B]     Other reaction(s): rash   Other     Other reaction(s): rash   Tramadol Hcl     Other reaction(s): rash   Hydrocodone Rash    Can tolerate with benadryl   Neosporin [Neomycin-Bacitracin Zn-Polymyx] Rash   Oxycodone Rash    Can tolerate with benadryl    ROS: Pertinent items noted in HPI and remainder of comprehensive ROS otherwise negative.  HOME MEDS: Current Outpatient  Medications  Medication Sig Dispense Refill   acetaminophen (TYLENOL) 500 MG tablet Take 1,000 mg by mouth every 6 (six) hours as needed for mild pain or headache.     albuterol (VENTOLIN HFA) 108 (90 Base) MCG/ACT inhaler Inhale 2 puffs into the lungs every 4 (four) hours.     alendronate (FOSAMAX) 70 MG tablet Take 70 mg by mouth every Wednesday. Take with a full glass of water on an empty stomach.     allopurinol (ZYLOPRIM) 100 MG tablet Take 200 mg by mouth daily.   0   atorvastatin (LIPITOR) 80 MG tablet TAKE 1 TABLET BY MOUTH DAILY AT 6 PM (Patient taking differently: Take 80 mg by mouth every evening.) 90 tablet 3   benzonatate (TESSALON) 200 MG capsule Take 200 mg by mouth 3 (three) times daily as needed.     clopidogrel (PLAVIX) 75 MG tablet Take 1 tablet (75 mg total) by mouth daily with breakfast. 90 tablet 3   COLCRYS 0.6 MG tablet Take 0.6 mg by mouth daily as needed (FOR GOUT).      ferrous sulfate 325 (65 FE) MG tablet 1 tablet     guaiFENesin-codeine 100-10 MG/5ML syrup Take 10 mLs by mouth every 4 (four) hours as needed.     ketotifen (ZADITOR) 0.025 % ophthalmic solution Place 1 drop into both eyes as needed for allergies.     Light Mineral Oil-Mineral Oil (RETAINE MGD) 0.5-0.5 % EMUL Place 1 drop into  both eyes in the morning, at noon, and at bedtime.     loratadine (CLARITIN) 10 MG tablet Take 10 mg by mouth daily as needed for allergies.     Multiple Vitamin (MULTIVITAMIN WITH MINERALS) TABS tablet Take 1 tablet by mouth daily. Centrum Silver     nitroGLYCERIN (NITROSTAT) 0.4 MG SL tablet PLACE 1 TAB UNDER TONGUE EVERY 5 MINS AS NEEDED FOR CHEST PAIN,MAX 3 DOSES 25 tablet 5   Omega-3 Fat Ac-Cholecalciferol (DRY EYE OMEGA BENEFITS/VIT D-3 PO) Take 1 capsule by mouth 3 (three) times daily.     pantoprazole (PROTONIX) 40 MG tablet Take 1 tablet (40 mg total) by mouth daily. 30 tablet 12   PARoxetine (PAXIL) 20 MG tablet Take 20 mg by mouth every other day. Take 1/2 tablet every  other day.     predniSONE (DELTASONE) 20 MG tablet Take 40 mg by mouth daily.     rivaroxaban (XARELTO) 20 MG TABS tablet Take 1 tablet (20 mg total) by mouth daily with supper. 90 tablet 1   sildenafil (REVATIO) 20 MG tablet Take 1 tablet by mouth as needed for erectile dysfunction.     testosterone cypionate (DEPOTESTOSTERONE CYPIONATE) 200 MG/ML injection Inject 200 mg into the muscle See admin instructions. Every other week     valsartan (DIOVAN) 160 MG tablet TAKE 1 TABLET BY MOUTH EVERY DAY (Patient taking differently: Take 80 mg by mouth every evening.) 90 tablet 2   vitamin B-12 (CYANOCOBALAMIN) 500 MCG tablet 1 tablet     No current facility-administered medications for this visit.    LABS/IMAGING: No results found for this or any previous visit (from the past 48 hour(s)). No results found.  VITALS: BP 132/72   Pulse (!) 56   Ht '6\' 1"'$  (1.854 m)   Wt 226 lb (102.5 kg)   SpO2 97%   BMI 29.82 kg/m   EXAM: General appearance: alert and no distress Neck: no carotid bruit, no JVD and thyroid not enlarged, symmetric, no tenderness/mass/nodules Lungs: clear to auscultation bilaterally Heart: irregularly irregular rhythm and tachycardic Abdomen: soft, non-tender; bowel sounds normal; no masses,  no organomegaly Extremities: extremities normal, atraumatic, no cyanosis or edema Pulses: 2+ and symmetric Skin: Skin color, texture, turgor normal. No rashes or lesions Neurologic: Grossly normal Psych: Pleasant  EKG: Sinus bradycardia with PVCs of 56, nonspecific IVCD-personally reviewed  ASSESSMENT: A. 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   Jose Stone seems to be doing well without any recurrent A. fib.  He denies any chest pain.  He will be due to follow-up with his ectatic aorta and dilated aortic root by CT which we will do next year.  Follow-up with me in May and will consider possibly coming off the clopidogrel at that time.  [name], MD, [facility], [place] Director of the [facility] Lipid Disorders &  Cardiovascular Risk Reduction [facility] Attending Cardiologist  Direct Dial: [phone]  Fax: [phone]  Website:  [email or web address]  Nadean Corwin Rinnah Peppel 07/19/2021, 1:35 PM

## 2021-07-25 ENCOUNTER — Other Ambulatory Visit: Payer: Self-pay

## 2021-07-25 ENCOUNTER — Ambulatory Visit (INDEPENDENT_AMBULATORY_CARE_PROVIDER_SITE_OTHER): Payer: Medicare Other | Admitting: Podiatry

## 2021-07-25 DIAGNOSIS — D689 Coagulation defect, unspecified: Secondary | ICD-10-CM

## 2021-07-25 DIAGNOSIS — L84 Corns and callosities: Secondary | ICD-10-CM

## 2021-07-31 NOTE — Progress Notes (Signed)
Subjective: 75 year old male presents the office today for follow-up evaluation of a pre-ulcerative callus on the left foot.  He states that he has had some blood underneath the area.  No open sores or swelling or redness or any drainage.  He has no other concerns today.  No fever or chills.    He is currently on Plavix as well as Xarelto and had a recent cardiac stent placed.  Objective: AAO x3, NAD DP/PT pulses palpable bilaterally, CRT less than 3 seconds Hyperkeratotic tissue with evidence of dried blood.  Upon debridement there is no underlying ulceration drainage or any signs of infection.  It is preulcerative.  There is no surrounding erythema, ascending cellulitis but there is no fluctuation crepitation but there is no malodor.  No signs of infection. No pain with calf compression, swelling, warmth, erythema  Assessment: Preulcerative callus left hallux IPJ; anticoagulation  Plan: -All treatment options discussed with the patient including all alternatives, risks, complications.  -Debrided the hyperkeratotic tissue with any complications or bleeding.  Recommend moisturizer daily.  Continue offloading.  Monitor for skin breakdown or any recurrence of infection.  Recommended not to try to trim the callus himself. -Continue inserts that he has well prevent pronation.  Return in about 6 weeks (around 09/05/2021) for pre-ulcerative callus.  Trula Slade DPM

## 2021-07-31 NOTE — Progress Notes (Signed)
Cardiology Office Note Date:  08/05/2021  Patient ID:  Jose Stone, Jose Stone 17-Aug-1946, MRN [id number] PCP:  Kathyrn Lass, MD  Cardiologist:  Dr. [name] Electrophysiologist: Dr. Rayann Heman    Chief Complaint: planned EP follow up  History of Present Illness: Jose Stone is a 75 y.o. male with history of congenital heart disease with restrictive VSD (with no clinic sequale), CAD (most recently, PCI to OM May 2022), HTN, HLD, Afib/flutter (s/p PVI/CTI ablation Sept 2021), vagal syncope  He comes today to be seen for dr.Allred, last seen by hoim March 2022, was doing well post ablation, maintained on xarelto.  Mentions off Paxil without recurrent syncope., recommended lifestyle modifications for weight management  He had a hospital stay May 2022 2/2 CP, LHC was done PCI to OM, planned for Triple therapy for 30 days then stop ASA >> for plavix and Xarelto  More recently he saw Dr. [name] 07/19/21, was doing OK, planned to follow Ao root dilation with CTs, planned to continue Plavix at least a year post PCI  TODAY He is doing well. No CP, palpitations or cardiac awareness. He is part of a DM management/support team and they monitor his activity, getting about 6,000 steps a day, remains active around the house, yard work etc without exertional intolerances or changes.  Since his ablation he thinks he has had only about an hour of AF No bleeding or signs of bleeding  NO dizzy spells, near syncope or syncope.   Past Medical History:  Diagnosis Date   Aortic root enlargement (HCC)    CAD (coronary artery disease)    Complication of anesthesia    Hypotensive with general anesth.   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    Ventricular septal defect     Past Surgical History:   Procedure Laterality Date   ATRIAL FIBRILLATION ABLATION N/A 07/20/2020   Procedure: ATRIAL FIBRILLATION ABLATION;  Surgeon: [name], MD;  Location: [facility];  Service: Cardiovascular;  Laterality: N/A;   CARDIAC CATHETERIZATION     CARDIOVERSION N/A 02/29/2020   Procedure: CARDIOVERSION;  Surgeon: [name], MD;  Location: [facility];  Service: Cardiovascular;  Laterality: N/A;   CARDIOVERSION N/A 06/28/2020   Procedure: CARDIOVERSION;  Surgeon: Thayer Headings, MD;  Location: [facility];  Service: Cardiovascular;  Laterality: N/A;   CORONARY STENT INTERVENTION N/A 03/18/2021   Procedure: CORONARY STENT INTERVENTION;  Surgeon: Burnell Blanks, MD;  Location: [facility];  Service: Cardiovascular;  Laterality: N/A;   DISTAL INTERPHALANGEAL JOINT FUSION Left 01/19/2019   Procedure: HALLUX INTERPHANGEAL JOINT FUSION LEFT FOOT;  Surgeon: Trula Slade, DPM;  Location: [facility];  Service: Podiatry;  Laterality: Left;  LEFT BLOCK   hipospedious     LEFT HEART CATH AND CORONARY ANGIOGRAPHY N/A 03/18/2021   Procedure: LEFT HEART CATH AND CORONARY ANGIOGRAPHY;  Surgeon: Burnell Blanks, MD;  Location: [facility];  Service: Cardiovascular;  Laterality: N/A;   LEFT HEART CATHETERIZATION WITH CORONARY ANGIOGRAM N/A 08/14/2014   Procedure: LEFT HEART CATHETERIZATION WITH CORONARY ANGIOGRAM;  Surgeon: Birdie Riddle, MD;  Location: [facility];  Service: Cardiovascular;  Laterality: N/A;   MASS EXCISION Left 01/19/2019   Procedure: EXCISION BENIGN LESION 2.OCM LEFT  FOOT;  Surgeon: Trula Slade, DPM;  Location: [facility];  Service: Podiatry;  Laterality: Left;   SHOULDER ARTHROSCOPY WITH SUBACROMIAL DECOMPRESSION Left 05/03/2020   Procedure: SHOULDER ARTHROSCOPY DEBRIDEMENT PARTIAL ROTATOR CUFF REPAIR, SUBACROMIAL DECOMPRESSION, BICEPS TENOTOMY;  Surgeon: Tania Ade, MD;  Location: [facility];  Service: Orthopedics;  Laterality: Left;   SHOULDER  SURGERY     TONSILLECTOMY  1955    Current Outpatient Medications  Medication Sig Dispense Refill   acetaminophen (TYLENOL) 500 MG tablet Take 1,000 mg by mouth every 6 (six) hours as needed for mild pain or headache.     albuterol (VENTOLIN HFA) 108 (90 Base) MCG/ACT inhaler Inhale 2 puffs into the lungs every 4 (four) hours.     alendronate (FOSAMAX) 70 MG tablet Take 70 mg by mouth every Wednesday. Take with a full glass of water on an empty stomach.     allopurinol (ZYLOPRIM) 100 MG tablet Take 200 mg by mouth daily.   0   atorvastatin (LIPITOR) 80 MG tablet TAKE 1 TABLET BY MOUTH DAILY AT 6 PM 90 tablet 3   benzonatate (TESSALON) 200 MG capsule Take 200 mg by mouth 3 (three) times daily as needed.     clopidogrel (PLAVIX) 75 MG tablet Take 1 tablet (75 mg total) by mouth daily with breakfast. 90 tablet 3   COLCRYS 0.6 MG tablet Take 0.6 mg by mouth daily as needed (FOR GOUT).      ferrous sulfate 325 (65 FE) MG tablet 1 tablet     guaiFENesin-codeine 100-10 MG/5ML syrup Take 10 mLs by mouth every 4 (four) hours as needed.     ketotifen (ZADITOR) 0.025 % ophthalmic solution Place 1 drop into both eyes as needed for allergies.     Light Mineral Oil-Mineral Oil (RETAINE MGD) 0.5-0.5 % EMUL Place 1 drop into both eyes in the morning, at noon, and at bedtime.     loratadine (CLARITIN) 10 MG tablet Take 10 mg by mouth daily as needed for allergies.     Multiple Vitamin (MULTIVITAMIN WITH MINERALS) TABS tablet Take 1 tablet by mouth daily. Centrum Silver     nitroGLYCERIN (NITROSTAT) 0.4 MG SL tablet PLACE 1 TAB UNDER TONGUE EVERY 5 MINS AS NEEDED FOR CHEST PAIN,MAX 3 DOSES 25 tablet 5   Omega-3 Fat Ac-Cholecalciferol (DRY EYE OMEGA BENEFITS/VIT D-3 PO) Take 1 capsule by mouth 3 (three) times daily.     pantoprazole (PROTONIX) 40 MG tablet Take 1 tablet (40 mg total) by mouth daily. 30 tablet 12   predniSONE (DELTASONE) 20 MG tablet Take 40 mg by mouth daily.     rivaroxaban (XARELTO) 20 MG  TABS tablet Take 1 tablet (20 mg total) by mouth daily with supper. 90 tablet 1   sildenafil (REVATIO) 20 MG tablet Take 1 tablet by mouth as needed for erectile dysfunction.     testosterone cypionate (DEPOTESTOSTERONE CYPIONATE) 200 MG/ML injection Inject 200 mg into the muscle See admin instructions. Every other week     valsartan (DIOVAN) 160 MG tablet TAKE 1 TABLET BY MOUTH EVERY DAY (Patient taking differently: Take 80 mg by mouth every evening.) 90 tablet 2   vitamin B-12 (CYANOCOBALAMIN) 500 MCG tablet 1 tablet     No current facility-administered medications for this visit.    Allergies:   Ciprofloxacin, Codeine, Diltiazem cd [diltiazem hcl], Mupirocin, Neosporin [bacitracin-polymyxin b], Other, Tramadol hcl, Hydrocodone, Neosporin [neomycin-bacitracin zn-polymyx], and Oxycodone   Social History:  The patient  reports that he has never smoked. He has  never used smokeless tobacco. He reports that he does not drink alcohol and does not use drugs.   Family History:  The patient's family history includes Aortic aneurysm in his maternal grandmother; CAD in his paternal grandfather; Diabetes in his paternal grandfather; Heart attack in his maternal grandfather; Pancreatic cancer in his mother.  ROS:  Please see the history of present illness.    All other systems are reviewed and otherwise negative.   PHYSICAL EXAM:  VS:  BP 134/82   Pulse 66   Ht 6\' 1"  (1.854 m)   Wt 222 lb (100.7 kg)   SpO2 96%   BMI 29.29 kg/m  BMI: Body mass index is 29.29 kg/m. Well nourished, well developed, in no acute distress HEENT: normocephalic, atraumatic Neck: no JVD, carotid bruits or masses Cardiac:  RRR; 1-2/6SM, no rubs, or gallops Lungs:  CTA b/l, no wheezing, rhonchi or rales Abd: soft, nontender MS: no deformity or atrophy Ext: no edema Skin: warm and dry, no rash Neuro:  No gross deficits appreciated Psych: euthymic mood, full affect     EKG:  not done today  Left heart cath  03/18/21: Prox RCA lesion is 60% stenosed. 1st Mrg-1 lesion is 95% stenosed. 1st Mrg-2 lesion is 10% stenosed. A drug-eluting stent was successfully placed using a SYNERGY XD 2.75X12. Post intervention, there is a 0% residual stenosis. LPAV lesion is 60% stenosed. Prox Cx to Mid Cx lesion is 50% stenosed. Prox LAD to Mid LAD lesion is 60% stenosed. Mid LAD lesion is 60% stenosed.   1. Moderate disease in the mid LAD that does not appear to be changed from last cath. These lesions do not appear to be flow limiting 2. Severe stenosis proximal segment of the first obtuse marginal branch just proximal to the old stent. Moderate mid and distal Circumflex stenosis.  3. Moderate disease in the small non-dominant RCA 4. Successful PTCA/DES x 1 obtuse marginal branch.    Recommendations: He will need DAPT with ASA and Plavix for one month then would stop ASA and continue Plavix along with the Xarelto.      Echo 03/17/21: 1. 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  Left ventricular ejection fraction, by estimation, is 55 to 60%. The left  ventricle has normal function. The left ventricle has no regional wall  motion abnormalities. There is mild  left ventricular hypertrophy. Left ventricular diastolic parameters were  normal.   2. RV-RA gradient 36 mmHg suggesting at least mildly increased RVSP.  Right ventricular systolic function is normal. The right ventricular size  is normal.   3. Left atrial size was severely dilated.   4. The mitral valve is grossly normal. Trivial mitral valve  regurgitation.   5. The aortic valve is tricuspid. Aortic valve regurgitation is trivial.  Mild aortic valve sclerosis is present, with no evidence of aortic valve  stenosis.   6. Aortic dilatation noted. There is moderate dilatation of the aortic  root, measuring 44 mm.   7. Unable to estimate CVP.    07/20/20: EPS/Ablation CONCLUSIONS: 1. Sinus rhythm upon presentation.   2.  Intracardiac echo reveals a normal sized left atrium.  The coronary sinus was large as previously noted on CT. 3. Successful electrical isolation and anatomical encircling of all four pulmonary veins with radiofrequency current. 4. Additional mapping and ablation within the left atrium due to persistence of atrial fibrillation with a posterior wall box demonstrated  5. Atrial fibrillation successfully cardioverted to sinus rhythm.  6. Cavo-tricuspid isthmus ablation performed with complete bidirectional isthmus block achieved 7. No inducible arrhythmias following ablation 8. No early apparent complications.   Recent Labs: 03/17/2021: ALT 23; Magnesium 1.8; TSH 5.176 03/19/2021: BUN 14; Creatinine, Ser 1.14; Hemoglobin 11.4; Platelets 219; Potassium 4.0; Sodium 139  03/18/2021: Cholesterol 104; HDL 37; LDL Cholesterol 57; Total CHOL/HDL Ratio 2.8; Triglycerides 50; VLDL 10   CrCl cannot be calculated (Patient's most recent lab result is older than the maximum 21 days allowed.).   Wt Readings from Last 3 Encounters:  08/05/21 222 lb (100.7 kg)  07/19/21 226 lb (102.5 kg)  04/04/21 223 lb 6.4 oz (101.3 kg)     Other studies reviewed: Additional studies/records reviewed today include: summarized above  ASSESSMENT AND PLAN:  Persistent AFib, typical Aflutter CHA2DS2Vasc is 3, on Xarelto, appropriately dosed S/p PVI/CTI ablation Sept 2021 No symptoms of his AF  He has heard that Dr. Rayann Heman will no longer be practicing soon, and at this juncture feels most comfortable following with Dr. [name] primarily and Dr. Claiborne Billings for his OSA and should he need EP in the future have referral to another EP MD in the group with PRN EP follow up otherwise  2. CAD No anginal symptoms S/p PCI may 2022, plavix/Xarelto On plavix, statin There has been mention of some degree of bradycardia by Dr. [name], likely why not on a BB Continue with Dr. [name] and team  3. Known perimembranous VSD Described as small  and without clinical significance  C/w Dr. [name]  4. HTN Looks ok   Disposition: F/u with EP PRN, and Dr. Gerri Lins team in 23mo, sooner if needed  Current medicines are reviewed at length with the patient today.  The patient did not have any concerns regarding medicines.  Venetia Night, PA-C 08/05/2021 10:24 AM     [facility] [address] [place]  [address] [phone] (office)  [phone] (fax)

## 2021-08-01 ENCOUNTER — Other Ambulatory Visit: Payer: Self-pay | Admitting: Internal Medicine

## 2021-08-05 ENCOUNTER — Encounter: Payer: Self-pay | Admitting: Physician Assistant

## 2021-08-05 ENCOUNTER — Ambulatory Visit (INDEPENDENT_AMBULATORY_CARE_PROVIDER_SITE_OTHER): Payer: Medicare Other | Admitting: Physician Assistant

## 2021-08-05 ENCOUNTER — Other Ambulatory Visit: Payer: Self-pay

## 2021-08-05 VITALS — BP 134/82 | HR 66 | Ht 73.0 in | Wt 222.0 lb

## 2021-08-05 DIAGNOSIS — I10 Essential (primary) hypertension: Secondary | ICD-10-CM | POA: Diagnosis not present

## 2021-08-05 DIAGNOSIS — I2510 Atherosclerotic heart disease of native coronary artery without angina pectoris: Secondary | ICD-10-CM

## 2021-08-05 DIAGNOSIS — I4819 Other persistent atrial fibrillation: Secondary | ICD-10-CM

## 2021-08-05 NOTE — Patient Instructions (Signed)
Medication Instructions:   Your physician recommends that you continue on your current medications as directed. Please refer to the Current Medication list given to you today.  *If you need a refill on your cardiac medications before your next appointment, please call your pharmacy*   Lab Work: [place]    If you have labs (blood work) drawn today and your tests are completely normal, you will receive your results only by: [place] (if you have MyChart) OR A paper copy in the mail If you have any lab test that is abnormal or we need to change your treatment, we will call you to review the results.   Testing/Procedures: NONE ORDERED  TODAY     Follow-Up: At [facility], you and your health needs are our priority.  As part of our continuing mission to provide you with exceptional heart care, we have created designated Provider Care Teams.  These Care Teams include your primary Cardiologist (physician) and Advanced Practice Providers (APPs -  Physician Assistants and Nurse Practitioners) who all work together to provide you with the care you need, when you need it.  We recommend signing up for the patient portal called "MyChart".  Sign up information is provided on this After Visit Summary.  MyChart is used to connect with patients for Virtual Visits (Telemedicine).  Patients are able to view lab/test results, encounter notes, upcoming appointments, etc.  Non-urgent messages can be sent to your provider as well.   To learn more about what you can do with MyChart, go to [email or web address].    Your next appointment:    IN ELECTROPHYSIOLOGY DEPARTMENT CONTACT [facility] [phone] AS NEEDED FOR  ANY CARDIAC RELATED SYMPTOMS   6 month(s)  The format for your next appointment:   In Person  Provider:   K. Mali Hilty, MD   Other Instructions

## 2021-08-07 DIAGNOSIS — M1A9XX0 Chronic gout, unspecified, without tophus (tophi): Secondary | ICD-10-CM | POA: Diagnosis not present

## 2021-08-07 DIAGNOSIS — K219 Gastro-esophageal reflux disease without esophagitis: Secondary | ICD-10-CM | POA: Diagnosis not present

## 2021-08-07 DIAGNOSIS — R55 Syncope and collapse: Secondary | ICD-10-CM | POA: Diagnosis not present

## 2021-08-07 DIAGNOSIS — R7303 Prediabetes: Secondary | ICD-10-CM | POA: Diagnosis not present

## 2021-08-07 DIAGNOSIS — S22000A Wedge compression fracture of unspecified thoracic vertebra, initial encounter for closed fracture: Secondary | ICD-10-CM | POA: Diagnosis not present

## 2021-08-07 DIAGNOSIS — Z23 Encounter for immunization: Secondary | ICD-10-CM | POA: Diagnosis not present

## 2021-08-09 ENCOUNTER — Other Ambulatory Visit: Payer: Self-pay | Admitting: Internal Medicine

## 2021-08-13 ENCOUNTER — Ambulatory Visit: Payer: Medicare Other | Admitting: Podiatry

## 2021-08-19 ENCOUNTER — Encounter: Payer: Medicare Other | Attending: Family Medicine | Admitting: Dietician

## 2021-08-19 ENCOUNTER — Other Ambulatory Visit: Payer: Self-pay

## 2021-08-19 DIAGNOSIS — U071 COVID-19: Secondary | ICD-10-CM | POA: Diagnosis not present

## 2021-08-19 DIAGNOSIS — R7303 Prediabetes: Secondary | ICD-10-CM | POA: Diagnosis not present

## 2021-08-19 NOTE — Progress Notes (Signed)
On 08/19/2021 patient completed a post core session of the Diabetes Prevention Program course virtually with [facility]. By the end of this session patients are able to complete the following objectives:   Virtual Visit via Video Note  I connected with Jose Stone, [birth year] by a video enabled application and verified that I am speaking with the correct person using two identifiers.  Location: Patient: Virtual Provider: Office  Learning Objectives: Identify different types of stressors List effects of stress on health and healthy lifestyle choices Discuss how stress affects lifestyle choices  Name healthy ways to manage and avoid stressors   Goals:  Record weight taken outside of class.  Track foods and beverages eaten each day in the "Food and Activity Tracker," including calories and fat grams for each item.   Track activity type, minutes you were active, and distance you reached each day in the "Food and Activity Tracker."   Follow-Up Plan: Attend next session.  Email completed "Food and Activity Trackers" before next session to be reviewed by Lifestyle Coach.

## 2021-08-26 DIAGNOSIS — H524 Presbyopia: Secondary | ICD-10-CM | POA: Diagnosis not present

## 2021-08-26 DIAGNOSIS — H26103 Unspecified traumatic cataract, bilateral: Secondary | ICD-10-CM | POA: Diagnosis not present

## 2021-09-05 ENCOUNTER — Other Ambulatory Visit: Payer: Self-pay

## 2021-09-05 ENCOUNTER — Ambulatory Visit (INDEPENDENT_AMBULATORY_CARE_PROVIDER_SITE_OTHER): Payer: Medicare Other | Admitting: Podiatry

## 2021-09-05 ENCOUNTER — Ambulatory Visit: Payer: Medicare Other | Admitting: Podiatry

## 2021-09-05 DIAGNOSIS — I2510 Atherosclerotic heart disease of native coronary artery without angina pectoris: Secondary | ICD-10-CM | POA: Diagnosis not present

## 2021-09-05 DIAGNOSIS — L84 Corns and callosities: Secondary | ICD-10-CM | POA: Diagnosis not present

## 2021-09-05 DIAGNOSIS — L601 Onycholysis: Secondary | ICD-10-CM

## 2021-09-08 NOTE — Progress Notes (Signed)
Subjective: 75 year old male presents the office today for follow-up evaluation of a pre-ulcerative callus on the left foot.  Also states of the nail on the right big toe is come off.  He has not seen any open sores, swelling or redness or any drainage.  There is a dark and black spot of a callus on the left foot but not seeing any active draining or bleeding.  Denies any fevers or chills.  Has no other concerns.    He is currently on Plavix as well as Xarelto and had a recent cardiac stent placed.  Objective: AAO x3, NAD DP/PT pulses palpable bilaterally, CRT less than 3 seconds Hyperkeratotic tissue with evidence of dried blood.  Upon debridement there is no underlying ulceration drainage or any signs of infection.  It is still preulcerative.  There is no surrounding erythema, ascending cellulitis but there is no fluctuation crepitation but there is no malodor.  No signs of infection. On the right hallux the nails, but there is a new nail starting to come in.  There was a couple rough edges which I debrided the nail today there is no edema, erythema, drainage or pus or any obvious signs of infection. No pain with calf compression, swelling, warmth, erythema  Assessment: Preulcerative callus left hallux IPJ; anticoagulation; onycholysis right hallux  Plan: -All treatment options discussed with the patient including all alternatives, risks, complications.  -Debrided the hyperkeratotic tissue with any complications or bleeding.  Recommend moisturizer daily.  Continue offloading.  Monitor for skin breakdown or any recurrence of infection.  Recommended not to try to trim the callus himself. -Debrided some loose nail today of the right hallux without any complications or bleeding.  No signs of infection will monitor closely for any.  Return in about 6 weeks   Trula Slade DPM

## 2021-09-23 ENCOUNTER — Other Ambulatory Visit: Payer: Self-pay

## 2021-09-23 ENCOUNTER — Encounter: Payer: Medicare Other | Attending: Family Medicine | Admitting: Dietician

## 2021-09-23 DIAGNOSIS — R7303 Prediabetes: Secondary | ICD-10-CM

## 2021-09-23 NOTE — Progress Notes (Signed)
On 09/23/2021 patient completed a post core session of the Diabetes Prevention Program course virtually with [facility]. By the end of this session patients are able to complete the following objectives:   Virtual Visit via Video Note  I connected with Jose Stone, 23-Nov-1945 by a video enabled application and verified that I am speaking with the correct person using two identifiers.  Location: Patient: Virtual Provider: Office  Learning Objectives: List 9 ways to to stay on track during special events/occasions. Create a plan to avoid a food problem at an upcoming special event/occasion Describe ways to make time for healthy behaviors during special events/occasions   Goals:  Record weight taken outside of class.  Track foods and beverages eaten each day in the "Food and Activity Tracker," including calories and fat grams for each item.   Track activity type, minutes you were active, and distance you reached each day in the "Food and Activity Tracker."   Follow-Up Plan: Attend next session.  Email completed "Food and Activity Trackers" before next session to be reviewed by Lifestyle Coach.

## 2021-10-10 DIAGNOSIS — E785 Hyperlipidemia, unspecified: Secondary | ICD-10-CM | POA: Diagnosis not present

## 2021-10-10 DIAGNOSIS — I10 Essential (primary) hypertension: Secondary | ICD-10-CM | POA: Diagnosis not present

## 2021-10-10 DIAGNOSIS — I2510 Atherosclerotic heart disease of native coronary artery without angina pectoris: Secondary | ICD-10-CM | POA: Diagnosis not present

## 2021-10-10 DIAGNOSIS — I4891 Unspecified atrial fibrillation: Secondary | ICD-10-CM | POA: Diagnosis not present

## 2021-10-10 DIAGNOSIS — K219 Gastro-esophageal reflux disease without esophagitis: Secondary | ICD-10-CM | POA: Diagnosis not present

## 2021-10-17 ENCOUNTER — Ambulatory Visit (INDEPENDENT_AMBULATORY_CARE_PROVIDER_SITE_OTHER): Payer: Medicare Other | Admitting: Podiatry

## 2021-10-17 ENCOUNTER — Other Ambulatory Visit: Payer: Self-pay

## 2021-10-17 DIAGNOSIS — L601 Onycholysis: Secondary | ICD-10-CM | POA: Diagnosis not present

## 2021-10-17 DIAGNOSIS — L97501 Non-pressure chronic ulcer of other part of unspecified foot limited to breakdown of skin: Secondary | ICD-10-CM | POA: Diagnosis not present

## 2021-10-17 DIAGNOSIS — I2510 Atherosclerotic heart disease of native coronary artery without angina pectoris: Secondary | ICD-10-CM | POA: Diagnosis not present

## 2021-10-17 MED ORDER — CEPHALEXIN 500 MG PO CAPS: 500.0000 mg | ORAL_CAPSULE | Freq: Three times a day (TID) | ORAL | 0 refills | Status: DC

## 2021-10-18 NOTE — Progress Notes (Signed)
Subjective: 75 year old male presents the office today for follow-up evaluation of a pre-ulcerative callus on the left foot.  States he is having some dried blood around the callus but denies any drainage or pus or any swelling or redness.  No significant pain.  States he does not wear the orthotics all the time.  No other concerns today.  He is currently on Plavix as well as Xarelto due to cardiac stent placed.  Objective: AAO x3, NAD DP/PT pulses palpable bilaterally, CRT less than 3 seconds Hyperkeratotic tissue with evidence of dried blood.  Upon debridement today there was superficial area of breakdown of the skin without any probing, or tunneling.  There is no surrounding erythema.  Some bloody drainage expressed there is no purulence.  No fluctuance or crepitation.  There is no malodor. Right hallux toenail is growing out without any pain, swelling redness or any drainage. Decreased medial arch upon weightbearing. No pain with calf compression, swelling, warmth, erythema  Assessment: Preulcerative callus left hallux IPJ; anticoagulation; onycholysis right hallux  Plan: -All treatment options discussed with the patient including all alternatives, risks, complications.  -Debrided the hyperkeratotic tissue with any complications to reveal the underlying superficial skin breakdown.  I cleansed the area.  Silvadene was applied followed by dressing.  Continue with daily dressing changes, offloading.  Prescribed cephalexin.  Monitor for any signs or symptoms of infection.  -Brian Little, orthotist, helped offload with surgical shoe.  -Continue monitor right hallux toenail as it grows out.   Return in about 2 weeks (around 10/31/2021).  Trula Slade DPM

## 2021-10-20 NOTE — Progress Notes (Signed)
Cardiology Office Note   Date:  10/21/2021   ID:  Jose Stone, DOB Mar 17, 1946, MRN [id number]  PCP:  Kathyrn Lass, MD  Cardiologist:  Dr. [name]  EP: Dr. Rayann Heman CC: Follow Up CPAP    History of Present Illness: Jose Stone is a 75 y.o. male who presents for ongoing assessment and management of congenital heart disease with restrictive VSD, CAD PCI to OM in May 2022, hypertension, hyperlipidemia, A. fib/flutter (status post PVI/CTI ablation in September 2021), he remains on Xarelto and clopidogrel.  With vagal syncope.  Other history includes OSA on CPAP.  He was last seen by Tommye Standard, PA.,  On 08/05/2021.  He was doing well from EP standpoint at that time, and requested that he can continue his cardiac follow-ups with Dr. [name] primarily and Dr. Claiborne Billings for OSA.  He denied any cardiac symptoms such as chest pain, dyspnea on exertion, rapid heart rhythm, or palpitations.  No medications were changed or testing planned.  Dr. [name] saw him last on 07/19/2021 at which time the patient was asymptomatic as well.  He did note that he had an ectatic aorta with an ascending aorta measuring 3.8 cm but was noted to have up to 4.4 cm by echo with sinus of Valsalva.  This will be followed closely.  He was to follow-up in May 2023 for decision to stop taking Plavix.  He also comes today for follow-up concerning CPAP and OSA.  The patient has been compliant with CPAP, his compliance report is reviewed there were only 2 days where he wore less than 4 hours majority of the time 97%.  Minimum pressure 10 cm's of H2O, maximum pressure 20 cm's H2O, EPR full-time, EPR level 3 response soft.  Therapy: Pressure cms/ H2O median 10.797 percentile 13.5 maximum 14.7 leaks per liter per minute median 1.9 95th percentile 12.2 maximum 21.8.  Events per hours AI 1.0, HI 1.6, AHI 2.6 apnea index central 0.1 obstructive 0.9, R ERA index 0.9.  Cheyne-Stokes respiration averages 0%.  Epworth sleepiness scale was 4.  1 for  sitting and reading, 1 for watching TV, 2 for lying down to rest in the afternoon when circumstances permit, otherwise 0 on all other questions. thank   Past Medical History:  Diagnosis Date   Aortic root enlargement (HCC)    CAD (coronary artery disease)    Complication of anesthesia    Hypotensive with general anesth.   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    Ventricular septal defect     Past Surgical History:  Procedure Laterality Date   ATRIAL FIBRILLATION ABLATION N/A 07/20/2020   Procedure: ATRIAL FIBRILLATION ABLATION;  Surgeon: [name], MD;  Location: [place] [facility];  Service: Cardiovascular;  Laterality: N/A;   CARDIAC CATHETERIZATION     CARDIOVERSION N/A 02/29/2020   Procedure: CARDIOVERSION;  Surgeon: [name], MD;  Location: [place];  Service: Cardiovascular;  Laterality: N/A;   CARDIOVERSION N/A 06/28/2020   Procedure: CARDIOVERSION;  Surgeon: Thayer Headings, MD;  Location[facility];  Service: Cardiovascular;  Laterality: N/A;   CORONARY STENT INTERVENTION N/A 03/18/2021   Procedure: CORONARY STENT INTERVENTION;  Surgeon: Burnell Blanks, MD;  Location: [place] [facility];  Service: Cardiovascular;  Laterality: N/A;  DISTAL INTERPHALANGEAL JOINT FUSION Left 01/19/2019   Procedure: HALLUX INTERPHANGEAL JOINT FUSION LEFT FOOT;  Surgeon: Trula Slade, DPM;  Location: [place];  Service: Podiatry;  Laterality: Left;  LEFT BLOCK   hipospedious     LEFT HEART CATH AND CORONARY ANGIOGRAPHY N/A 03/18/2021   Procedure: LEFT HEART CATH AND CORONARY ANGIOGRAPHY;  Surgeon: Burnell Blanks, MD;  Location: [place] [facility];  Service: Cardiovascular;  Laterality: N/A;   LEFT HEART CATHETERIZATION WITH CORONARY ANGIOGRAM N/A  08/14/2014   Procedure: LEFT HEART CATHETERIZATION WITH CORONARY ANGIOGRAM;  Surgeon: Birdie Riddle, MD;  Location: [place] [facility];  Service: Cardiovascular;  Laterality: N/A;   MASS EXCISION Left 01/19/2019   Procedure: EXCISION BENIGN LESION 2.OCM LEFT FOOT;  Surgeon: Trula Slade, DPM;  Location: [place];  Service: Podiatry;  Laterality: Left;   SHOULDER ARTHROSCOPY WITH SUBACROMIAL DECOMPRESSION Left 05/03/2020   Procedure: SHOULDER ARTHROSCOPY DEBRIDEMENT PARTIAL ROTATOR CUFF REPAIR, SUBACROMIAL DECOMPRESSION, BICEPS TENOTOMY;  Surgeon: Tania Ade, MD;  Location: [facility];  Service: Orthopedics;  Laterality: Left;   SHOULDER SURGERY     TONSILLECTOMY  1955     Current Outpatient Medications  Medication Sig Dispense Refill   acetaminophen (TYLENOL) 500 MG tablet Take 1,000 mg by mouth every 6 (six) hours as needed for mild pain or headache.     albuterol (VENTOLIN HFA) 108 (90 Base) MCG/ACT inhaler Inhale 2 puffs into the lungs every 4 (four) hours.     alendronate (FOSAMAX) 70 MG tablet Take 70 mg by mouth every Wednesday. Take with a full glass of water on an empty stomach.     allopurinol (ZYLOPRIM) 100 MG tablet Take 200 mg by mouth daily.   0   atorvastatin (LIPITOR) 80 MG tablet TAKE 1 TABLET BY MOUTH DAILY AT 6 PM 90 tablet 3   benzonatate (TESSALON) 200 MG capsule Take 200 mg by mouth 3 (three) times daily as needed.     cephALEXin (KEFLEX) 500 MG capsule Take 1 capsule (500 mg total) by mouth 3 (three) times daily. 30 capsule 0   COLCRYS 0.6 MG tablet Take 0.6 mg by mouth daily as needed (FOR GOUT).      ferrous sulfate 325 (65 FE) MG tablet 1 tablet     guaiFENesin-codeine 100-10 MG/5ML syrup Take 10 mLs by mouth every 4 (four) hours as needed.     ketotifen (ZADITOR) 0.025 % ophthalmic solution Place 1 drop into both eyes as needed for allergies.     LAGEVRIO 200 MG CAPS capsule Take by mouth.     Light Mineral Oil-Mineral Oil (RETAINE MGD) 0.5-0.5 % EMUL Place 1 drop into  both eyes in the morning, at noon, and at bedtime.     loratadine (CLARITIN) 10 MG tablet Take 10 mg by mouth daily as needed for allergies.     Multiple Vitamin (MULTIVITAMIN WITH MINERALS) TABS tablet Take 1 tablet by mouth daily. Centrum Silver     nitroGLYCERIN (NITROSTAT) 0.4 MG SL tablet PLACE 1 TAB UNDER TONGUE EVERY 5 MINS AS NEEDED FOR CHEST PAIN,MAX 3 DOSES 25 tablet 5   Omega-3 Fat Ac-Cholecalciferol (DRY EYE OMEGA BENEFITS/VIT D-3 PO) Take 1 capsule by mouth 3 (three) times daily.     pantoprazole (PROTONIX) 40 MG tablet 1 tablet     predniSONE (DELTASONE) 20 MG tablet Take 40 mg by mouth daily.     rivaroxaban (XARELTO) 20 MG TABS tablet Take 1 tablet (20 mg total) by mouth daily with supper. 90 tablet  1   sildenafil (REVATIO) 20 MG tablet Take 1 tablet by mouth as needed for erectile dysfunction.     testosterone cypionate (DEPOTESTOSTERONE CYPIONATE) 200 MG/ML injection Inject 200 mg into the muscle See admin instructions. Every other week     valsartan (DIOVAN) 160 MG tablet TAKE 1 TABLET BY MOUTH EVERY DAY 90 tablet 2   vitamin B-12 (CYANOCOBALAMIN) 500 MCG tablet 1 tablet     No current facility-administered medications for this visit.    Allergies:   Ciprofloxacin, Codeine, Diltiazem cd [diltiazem hcl], Mupirocin, Neosporin [bacitracin-polymyxin b], Other, Tramadol hcl, Hydrocodone, Neosporin [neomycin-bacitracin zn-polymyx], and Oxycodone    Social History:  The patient  reports that he has never smoked. He has never used smokeless tobacco. He reports that he does not drink alcohol and does not use drugs.   Family History:  The patient's family history includes Aortic aneurysm in his maternal grandmother; CAD in his paternal grandfather; Diabetes in his paternal grandfather; Heart attack in his maternal grandfather; Pancreatic cancer in his mother.    ROS: All other systems are reviewed and negative. Unless otherwise mentioned in H&P    PHYSICAL EXAM: VS:  BP 130/76     Pulse 66    Ht 6\' 1"  (1.854 m)    Wt 227 lb 3.2 oz (103.1 kg)    SpO2 99%    BMI 29.98 kg/m  , BMI Body mass index is 29.98 kg/m. GEN: Well nourished, well developed, in no acute distress HEENT: normal Neck: no JVD, carotid bruits, or masses Cardiac: RRR; 2/6 systolic murmurs, heard at the left sternal border, apex,, right sternal border, and upper abdomen without harsh bruit., rubs, or gallops,no edema  Respiratory:  Clear to auscultation bilaterally, normal work of breathing GI: soft, nontender, nondistended, + BS MS: no deformity or atrophy, walking cast on left foot status post callus removal Skin: warm and dry, no rash Neuro:  Strength and sensation are intact Psych: euthymic mood, full affect   EKG:  EKG is not ordered today.    Recent Labs: 03/17/2021: ALT 23; Magnesium 1.8; TSH 5.176 03/19/2021: BUN 14; Creatinine, Ser 1.14; Hemoglobin 11.4; Platelets 219; Potassium 4.0; Sodium 139    Lipid Panel    Component Value Date/Time   CHOL 104 03/18/2021 0605   CHOL 139 09/20/2019 0933   CHOL 131 01/22/2015 0812   TRIG 50 03/18/2021 0605   TRIG 93 01/22/2015 0812   HDL 37 (L) 03/18/2021 0605   HDL 52 09/20/2019 0933   HDL 49 01/22/2015 0812   CHOLHDL 2.8 03/18/2021 0605   VLDL 10 03/18/2021 0605   LDLCALC 57 03/18/2021 0605   LDLCALC 74 09/20/2019 0933   LDLCALC 63 01/22/2015 0812      Wt Readings from Last 3 Encounters:  10/21/21 227 lb 3.2 oz (103.1 kg)  08/05/21 222 lb (100.7 kg)  07/19/21 226 lb (102.5 kg)      Other studies Reviewed: Left heart cath 03/18/21: Prox RCA lesion is 60% stenosed. 1st Mrg-1 lesion is 95% stenosed. 1st Mrg-2 lesion is 10% stenosed. A drug-eluting stent was successfully placed using a SYNERGY XD 2.75X12. Post intervention, there is a 0% residual stenosis. LPAV lesion is 60% stenosed. Prox Cx to Mid Cx lesion is 50% stenosed. Prox LAD to Mid LAD lesion is 60% stenosed. Mid LAD lesion is 60% stenosed.   1. Moderate disease in the  mid LAD that does not appear to be changed from last cath. These lesions do not appear to be flow  limiting 2. Severe stenosis proximal segment of the first obtuse marginal branch just proximal to the old stent. Moderate mid and distal Circumflex stenosis.  3. Moderate disease in the small non-dominant RCA 4. Successful PTCA/DES x 1 obtuse marginal branch.    Recommendations: He will need DAPT with ASA and Plavix for one month then would stop ASA and continue Plavix along with the Xarelto.   Echo 03/17/21: 1. 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  Left ventricular ejection fraction, by estimation, is 55 to 60%. The left  ventricle has normal function. The left ventricle has no regional wall  motion abnormalities. There is mild  left ventricular hypertrophy. Left ventricular diastolic parameters were  normal.   2. RV-RA gradient 36 mmHg suggesting at least mildly increased RVSP.  Right ventricular systolic function is normal. The right ventricular size  is normal.   3. Left atrial size was severely dilated.   4. The mitral valve is grossly normal. Trivial mitral valve  regurgitation.   5. The aortic valve is tricuspid. Aortic valve regurgitation is trivial.  Mild aortic valve sclerosis is present, with no evidence of aortic valve  stenosis.   6. Aortic dilatation noted. There is moderate dilatation of the aortic  root, measuring 44 mm.   7. Unable to estimate CVP.   07/20/20: EPS/Ablation CONCLUSIONS: 1. Sinus rhythm upon presentation.   2. Intracardiac echo reveals a normal sized left atrium.  The coronary sinus was large as previously noted on CT. 3. Successful electrical isolation and anatomical encircling of all four pulmonary veins with radiofrequency current. 4. Additional mapping and ablation within the left atrium due to persistence of atrial fibrillation with a posterior wall box demonstrated  5. Atrial fibrillation successfully cardioverted to  sinus rhythm. 6. Cavo-tricuspid isthmus ablation performed with complete bidirectional isthmus block achieved 7. No inducible arrhythmias following ablation 8. No early apparent complications.    ASSESSMENT AND PLAN:  1.  OSA on CPAP: Parameters are reviewed along with compliance report which is very good.  Epworth sleep index was 4.  The patient has no complaints at this time.  He does have periods of insomnia where he stays up and watches television but it is rare.  He will follow-up with Dr. Claiborne Billings in 1 year, and with Allegra Lai, CMA for adjustment in CPAP should this become an issue for him.  2.  Coronary artery disease: Status post stent to the OM on May 2022 denies any complaints of chest pain, dyspnea on exertion, significant fatigue.  He is medically compliant.  Continue secondary prevention strategies.  3.  Hypercholesterolemia: Remains on atorvastatin 80 mg daily followed by Dr. [name] in the lipid clinic.  Most recent labs 03/18/2021 total cholesterol 104, LDL 57, HDL 37.  4.  Hypertension: Blood pressure is well controlled today.  I would not make any changes in his medications.  Continue valsartan 160 mg daily.  5.  Persistent atrial fibrillation: Remains on Xarelto 20 mg daily.  He is status post ablation in September 2021.  No active or new bleeding.  6.  Dilated aortic root: Follow-up CT angiogram of the chest and aorta on May 1 is scheduled for 2023.  Previous measurement revealed ascending aorta 3.8 cm but noted to have up to 4.4 cm by echo with sinus Valsalva.  Aortic valve is tricuspid.  Annual CT angiograms.  7.  VSD: Asymptomatic, followed by echocardiogram.  Current medicines are reviewed at length with the patient today.  I have spent 30 min's  dedicated to the care of this patient on the date of this encounter to include pre-visit review of records, assessment, management and diagnostic testing,with shared decision making.  Labs/ tests ordered today include: None   Phill Myron. West Pugh, ANP, AACC   10/21/2021 10:32 AM    [facility] [place] [address] Office [phone] Fax [phone]  Notice: This dictation was prepared with Dragon dictation along with smaller phrase technology. Any transcriptional errors that result from this process are unintentional and may not be corrected upon review.

## 2021-10-21 ENCOUNTER — Other Ambulatory Visit: Payer: Self-pay

## 2021-10-21 ENCOUNTER — Encounter: Payer: Self-pay | Admitting: Adult Health

## 2021-10-21 ENCOUNTER — Ambulatory Visit (INDEPENDENT_AMBULATORY_CARE_PROVIDER_SITE_OTHER): Payer: Medicare Other | Admitting: Adult Health

## 2021-10-21 ENCOUNTER — Encounter: Payer: Medicare Other | Attending: Family Medicine | Admitting: Dietician

## 2021-10-21 VITALS — BP 130/76 | HR 66 | Ht 73.0 in | Wt 227.2 lb

## 2021-10-21 DIAGNOSIS — Q210 Ventricular septal defect: Secondary | ICD-10-CM

## 2021-10-21 DIAGNOSIS — I482 Chronic atrial fibrillation, unspecified: Secondary | ICD-10-CM

## 2021-10-21 DIAGNOSIS — I77810 Thoracic aortic ectasia: Secondary | ICD-10-CM | POA: Diagnosis not present

## 2021-10-21 DIAGNOSIS — I10 Essential (primary) hypertension: Secondary | ICD-10-CM | POA: Diagnosis not present

## 2021-10-21 DIAGNOSIS — R7303 Prediabetes: Secondary | ICD-10-CM

## 2021-10-21 DIAGNOSIS — Z9989 Dependence on other enabling machines and devices: Secondary | ICD-10-CM | POA: Diagnosis not present

## 2021-10-21 DIAGNOSIS — I2510 Atherosclerotic heart disease of native coronary artery without angina pectoris: Secondary | ICD-10-CM

## 2021-10-21 DIAGNOSIS — E7800 Pure hypercholesterolemia, unspecified: Secondary | ICD-10-CM | POA: Diagnosis not present

## 2021-10-21 DIAGNOSIS — G4733 Obstructive sleep apnea (adult) (pediatric): Secondary | ICD-10-CM

## 2021-10-21 NOTE — Patient Instructions (Signed)
Medication Instructions:  No Changes *If you need a refill on your cardiac medications before your next appointment, please call your pharmacy*   Lab Work: No Labs If you have labs (blood work) drawn today and your tests are completely normal, you will receive your results only by: [place] (if you have MyChart) OR A paper copy in the mail If you have any lab test that is abnormal or we need to change your treatment, we will call you to review the results.   Testing/Procedures: No Testing   Follow-Up: At [facility], you and your health needs are our priority.  As part of our continuing mission to provide you with exceptional heart care, we have created designated Provider Care Teams.  These Care Teams include your primary Cardiologist (physician) and Advanced Practice Providers (APPs -  Physician Assistants and Nurse Practitioners) who all work together to provide you with the care you need, when you need it.  We recommend signing up for the patient portal called "MyChart".  Sign up information is provided on this After Visit Summary.  MyChart is used to connect with patients for Virtual Visits (Telemedicine).  Patients are able to view lab/test results, encounter notes, upcoming appointments, etc.  Non-urgent messages can be sent to your provider as well.   To learn more about what you can do with MyChart, go to [email or web address].    Your next appointment:   6 month(s)  The format for your next appointment:   In Person  Provider:   [name], MD   1 year with Shelva Majestic, MD

## 2021-10-21 NOTE — Progress Notes (Signed)
On 10/21/2021 patient completed a post core session of the Diabetes Prevention Program course virtually with [facility]. By the end of this session patients are able to complete the following objectives:   Virtual Visit via Video Note  I connected with Hilaria Ota, Apr 27, 1946 by a video enabled application and verified that I am speaking with the correct person using two identifiers.  Location: Patient: Virtual Provider: Office  Learning Objectives: Counter self-defeating thoughts with positive self-statements Define assertiveness.  List examples of ways to practice assertiveness.   Goals:  Record weight taken outside of class.  Track foods and beverages eaten each day in the "Food and Activity Tracker," including calories and fat grams for each item.   Track activity type, minutes you were active, and distance you reached each day in the "Food and Activity Tracker."   Follow-Up Plan: Attend next session.  Email completed "Food and Activity Trackers" before next session to be reviewed by [facility].

## 2021-10-30 ENCOUNTER — Other Ambulatory Visit: Payer: Self-pay | Admitting: General Practice

## 2021-10-30 NOTE — Telephone Encounter (Signed)
Prescription refill request for Xarelto received.  Indication:Afib  Last office visit:10/21/21 Jose Stone)  AXENMM:768.0SU Age: 75 Scr: 1.14 (03/19/21)  CrCl: 84.20ml/min  Appropriate dose and refill sent to requested pharmacy.

## 2021-10-31 ENCOUNTER — Ambulatory Visit: Payer: Medicare Other | Admitting: Podiatry

## 2021-11-01 DIAGNOSIS — J209 Acute bronchitis, unspecified: Secondary | ICD-10-CM | POA: Diagnosis not present

## 2021-11-01 DIAGNOSIS — J069 Acute upper respiratory infection, unspecified: Secondary | ICD-10-CM | POA: Diagnosis not present

## 2021-11-18 ENCOUNTER — Encounter: Payer: Medicare Other | Attending: Family Medicine | Admitting: Dietician

## 2021-11-18 ENCOUNTER — Other Ambulatory Visit: Payer: Self-pay

## 2021-11-18 DIAGNOSIS — R7303 Prediabetes: Secondary | ICD-10-CM | POA: Diagnosis not present

## 2021-11-18 NOTE — Progress Notes (Signed)
On 11/18/2021 pt completed a post core session of the Diabetes Prevention Program course virtually with [facility]. By the end of this session patients are able to complete the following objectives:   Virtual Visit via Video Note  I connected with Hilaria Ota, 01-03-1946 by a video enabled application and verified that I am speaking with the correct person using two identifiers.  Location: Patient: Virtual Provider: Office  Learning Objectives: Reflect on lifestyle changes they have made since starting the DPP.  Set long-term goals to promote continued maintenance of lifestyle changes made during the program.   Goals:  Work toward reaching new long-term goals set during class.   Follow-Up Plan: Contact [facility] with questions/concerns PRN.

## 2021-11-21 ENCOUNTER — Other Ambulatory Visit: Payer: Self-pay

## 2021-11-21 ENCOUNTER — Ambulatory Visit (INDEPENDENT_AMBULATORY_CARE_PROVIDER_SITE_OTHER): Payer: Medicare Other | Admitting: Podiatry

## 2021-11-21 DIAGNOSIS — L97501 Non-pressure chronic ulcer of other part of unspecified foot limited to breakdown of skin: Secondary | ICD-10-CM | POA: Diagnosis not present

## 2021-11-21 MED ORDER — CEPHALEXIN 500 MG PO CAPS: 500.0000 mg | ORAL_CAPSULE | Freq: Three times a day (TID) | ORAL | 0 refills | Status: DC

## 2021-11-24 NOTE — Progress Notes (Signed)
Subjective: 76 year old male presents the office today for follow-up evaluation of a wound on his right big toe.  He states that the blood has dried up again on the toe.  He also thinks he may have gout he has some pain and swelling to his ankle.  Denies any red streaks.  Denies any fevers or chills.  He has no other concerns today.   He is currently on Plavix as well as Xarelto due to cardiac stent placed.  Objective: AAO x3, NAD DP/PT pulses palpable bilaterally, CRT less than 3 seconds Hyperkeratotic tissue with evidence of dried blood on the hallux again today.  Upon debridement there is still superficial area skin breakdown.  There is no probing, undermining or tunneling.  There is a rim of erythema but no ascending cellulitis noted today.  No fluctuation or crepitation.  He does have slight swelling to his ankle but no significant cellulitis at this time.  He thinks that he has gout. Decreased medial arch upon weightbearing. Right hallux toenail is growing out.  No pain, swelling or redness or any signs of infection. No pain with calf compression, swelling, warmth, erythema  Assessment: Preulcerative callus left hallux IPJ; anticoagulation  Plan: -All treatment options discussed with the patient including all alternatives, risks, complications.  -Akintayo Sharp debrided the hyperkeratotic lesion left hallux today to reveal underlying ulceration.  I debrided nonviable tissue utilizing #312 scalpel.  This appeared to be superficial.  I am still concerned about infection and when to do another round of antibiotics, cephalexin.  Also for concern of gout he is already taking colchicine.  This could also be infection.  There is no significant swelling to the foot and the swelling that he is having is mostly to the medial aspect just inferior to the ankle. -Monitor for any clinical signs or symptoms of infection and directed to call the office immediately should any occur or go to the  ER.  Return in about 1 week (around 11/28/2021).  X-ray next appointment.  Order blood work next appointment if ulcer is still present.  Trula Slade DPM

## 2021-11-27 DIAGNOSIS — I10 Essential (primary) hypertension: Secondary | ICD-10-CM | POA: Diagnosis not present

## 2021-11-27 DIAGNOSIS — I2510 Atherosclerotic heart disease of native coronary artery without angina pectoris: Secondary | ICD-10-CM | POA: Diagnosis not present

## 2021-11-27 DIAGNOSIS — I4891 Unspecified atrial fibrillation: Secondary | ICD-10-CM | POA: Diagnosis not present

## 2021-11-27 DIAGNOSIS — K219 Gastro-esophageal reflux disease without esophagitis: Secondary | ICD-10-CM | POA: Diagnosis not present

## 2021-11-27 DIAGNOSIS — E785 Hyperlipidemia, unspecified: Secondary | ICD-10-CM | POA: Diagnosis not present

## 2021-11-28 ENCOUNTER — Other Ambulatory Visit: Payer: Self-pay

## 2021-11-28 ENCOUNTER — Ambulatory Visit (INDEPENDENT_AMBULATORY_CARE_PROVIDER_SITE_OTHER): Payer: Medicare Other | Admitting: Podiatry

## 2021-11-28 DIAGNOSIS — L84 Corns and callosities: Secondary | ICD-10-CM

## 2021-12-01 NOTE — Progress Notes (Signed)
Subjective: 76 year old male presents the office today for follow-up evaluation of a wound on his right big toe.  He states he is doing much better.  The wound is healed.  Not see any drainage or pus.  No increased swelling or redness.  The swelling the redness he had has resolved.  Denies any fevers or chills.  He is still in surgical shoe asking if he can get back to his normal walking of 2 miles per day.   He is currently on Plavix as well as Xarelto due to cardiac stent placed.  Objective: AAO x3, NAD DP/PT pulses palpable bilaterally, CRT less than 3 seconds Preulcerative area noted on the medial aspect of the hallux IPJ.  There is no significant hyperkeratotic tissue.  There are some dried blood under the skin however there is no definitive skin breakdown today.  There is no surrounding erythema, ascending cellulitis.  No fluctuation or crepitation.  No malodor.  No obvious signs of infection are noted today. Decreased medial arch upon weightbearing. Right hallux toenail is growing out.  No pain, swelling or redness or any signs of infection. No pain with calf compression, swelling, warmth, erythema  Assessment: Preulcerative callus left hallux IPJ; anticoagulation  Plan: -All treatment options discussed with the patient including all alternatives, risks, complications.  -Overall doing much better.  The wound is healed but still preulcerative.  No obvious signs of infection today so held off on blood work or repeat x-rays but will get this if needed.  Discussed with him gradual transition back to his regular shoe appropriate prior to this I like for him to follow-up with our orthotist, Aaron Edelman to see if there is any modifications that we can do to his custom inserts.  I will have him follow-up with Aaron Edelman next week. -Monitor for any clinical signs or symptoms of infection and directed to call the office immediately should any occur or go to the ER.  Trula Slade DPM

## 2021-12-06 ENCOUNTER — Other Ambulatory Visit: Payer: Medicare Other

## 2021-12-09 ENCOUNTER — Other Ambulatory Visit: Payer: Self-pay

## 2021-12-09 ENCOUNTER — Ambulatory Visit: Payer: Medicare Other

## 2021-12-09 DIAGNOSIS — L97501 Non-pressure chronic ulcer of other part of unspecified foot limited to breakdown of skin: Secondary | ICD-10-CM

## 2021-12-09 DIAGNOSIS — S92402K Displaced unspecified fracture of left great toe, subsequent encounter for fracture with nonunion: Secondary | ICD-10-CM

## 2021-12-09 NOTE — Progress Notes (Signed)
SITUATION Reason for Consult: Follow-up with right custom foot orthosis Patient / Caregiver Report: Patient reports Jose Stone would like additional offloding  OBJECTIVE DATA History / Diagnosis:    ICD-10-CM   1. Skin ulcer of toe, limited to breakdown of skin, unspecified laterality ([place])  L97.501     2. 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 Added 3/16" PPT pad to toe area to reduce pressure as patient has packed P-Cell topcoat flat. Device(s) intact and fit is excellent. All questions answered and concerns addressed.  PLAN Follow-up as needed (PRN). Plan of care discussed with and agreed upon by patient / caregiver.

## 2021-12-20 ENCOUNTER — Other Ambulatory Visit: Payer: Self-pay

## 2021-12-20 ENCOUNTER — Ambulatory Visit (INDEPENDENT_AMBULATORY_CARE_PROVIDER_SITE_OTHER): Payer: Medicare Other | Admitting: Podiatry

## 2021-12-20 DIAGNOSIS — L84 Corns and callosities: Secondary | ICD-10-CM

## 2021-12-20 DIAGNOSIS — L97501 Non-pressure chronic ulcer of other part of unspecified foot limited to breakdown of skin: Secondary | ICD-10-CM | POA: Diagnosis not present

## 2021-12-21 NOTE — Progress Notes (Signed)
Subjective: 76 year old male presents the office today for follow-up evaluation of a wound on his right big toe.  He states has been doing better.  He has been increasing his walking.  He did follow-up for orthotic modifications.  He has not seen any swelling or redness or any drainage.  No fevers or chills.  No other concerns.   He is currently on Plavix as well as Xarelto due to cardiac stent placed.  Objective: AAO x3, NAD DP/PT pulses palpable bilaterally, CRT less than 3 seconds Preulcerative area noted on the medial aspect of the hallux IPJ.  There is some dried blood present but upon debridement there is no underlying ulceration drainage or any signs of infection.  There is no surrounding erythema, ascending cellulitis.  No fluctuation or crepitation.  There is no malodor. No pain with calf compression, swelling, warmth, erythema     Assessment: Preulcerative callus left hallux IPJ  Plan: -All treatment options discussed with the patient including all alternatives, risks, complications.  -Sharply debrided the hyperkeratotic lesion without any complications or bleeding.  Continue moisturizer and offloading.  Continue orthotics.  Discussed monitoring his foot daily for any signs or symptoms of infection.  Return in about 9 weeks (around 02/21/2022).  Trula Slade DPM

## 2021-12-26 DIAGNOSIS — N3000 Acute cystitis without hematuria: Secondary | ICD-10-CM | POA: Diagnosis not present

## 2022-01-06 DIAGNOSIS — N3000 Acute cystitis without hematuria: Secondary | ICD-10-CM | POA: Diagnosis not present

## 2022-01-10 DIAGNOSIS — R948 Abnormal results of function studies of other organs and systems: Secondary | ICD-10-CM | POA: Diagnosis not present

## 2022-01-10 DIAGNOSIS — E291 Testicular hypofunction: Secondary | ICD-10-CM | POA: Diagnosis not present

## 2022-01-16 DIAGNOSIS — Z85820 Personal history of malignant melanoma of skin: Secondary | ICD-10-CM | POA: Diagnosis not present

## 2022-01-16 DIAGNOSIS — D1801 Hemangioma of skin and subcutaneous tissue: Secondary | ICD-10-CM | POA: Diagnosis not present

## 2022-01-16 DIAGNOSIS — D3611 Benign neoplasm of peripheral nerves and autonomic nervous system of face, head, and neck: Secondary | ICD-10-CM | POA: Diagnosis not present

## 2022-01-16 DIAGNOSIS — D485 Neoplasm of uncertain behavior of skin: Secondary | ICD-10-CM | POA: Diagnosis not present

## 2022-01-16 DIAGNOSIS — L820 Inflamed seborrheic keratosis: Secondary | ICD-10-CM | POA: Diagnosis not present

## 2022-01-16 DIAGNOSIS — D2262 Melanocytic nevi of left upper limb, including shoulder: Secondary | ICD-10-CM | POA: Diagnosis not present

## 2022-02-04 DIAGNOSIS — M1A9XX0 Chronic gout, unspecified, without tophus (tophi): Secondary | ICD-10-CM | POA: Diagnosis not present

## 2022-02-04 DIAGNOSIS — M810 Age-related osteoporosis without current pathological fracture: Secondary | ICD-10-CM | POA: Diagnosis not present

## 2022-02-04 DIAGNOSIS — Z23 Encounter for immunization: Secondary | ICD-10-CM | POA: Diagnosis not present

## 2022-02-04 DIAGNOSIS — E6609 Other obesity due to excess calories: Secondary | ICD-10-CM | POA: Diagnosis not present

## 2022-02-04 DIAGNOSIS — N3000 Acute cystitis without hematuria: Secondary | ICD-10-CM | POA: Diagnosis not present

## 2022-02-04 DIAGNOSIS — R972 Elevated prostate specific antigen [PSA]: Secondary | ICD-10-CM | POA: Diagnosis not present

## 2022-02-04 DIAGNOSIS — E291 Testicular hypofunction: Secondary | ICD-10-CM | POA: Diagnosis not present

## 2022-02-04 DIAGNOSIS — N35010 Post-traumatic urethral stricture, male, meatal: Secondary | ICD-10-CM | POA: Diagnosis not present

## 2022-02-04 DIAGNOSIS — R030 Elevated blood-pressure reading, without diagnosis of hypertension: Secondary | ICD-10-CM | POA: Diagnosis not present

## 2022-02-24 DIAGNOSIS — I77810 Thoracic aortic ectasia: Secondary | ICD-10-CM | POA: Diagnosis not present

## 2022-02-24 DIAGNOSIS — I480 Paroxysmal atrial fibrillation: Secondary | ICD-10-CM | POA: Diagnosis not present

## 2022-02-24 LAB — BASIC METABOLIC PANEL
BUN/Creatinine Ratio: 21 (ref 10–24)
BUN: 19 mg/dL (ref 8–27)
CO2: 23 mmol/L (ref 20–29)
Calcium: 9.4 mg/dL (ref 8.6–10.2)
Chloride: 104 mmol/L (ref 96–106)
Creatinine, Ser: 0.91 mg/dL (ref 0.76–1.27)
Glucose: 92 mg/dL (ref 70–99)
Potassium: 4.6 mmol/L (ref 3.5–5.2)
Sodium: 141 mmol/L (ref 134–144)
eGFR: 88 mL/min/{1.73_m2} (ref >59)

## 2022-03-03 ENCOUNTER — Ambulatory Visit
Admission: RE | Admit: 2022-03-03 | Discharge: 2022-03-03 | Disposition: A | Discharge status: Home / Self Care | Payer: Medicare Other | Source: Ambulatory Visit | Attending: Internal Medicine | Admitting: Internal Medicine

## 2022-03-03 ENCOUNTER — Other Ambulatory Visit: Payer: Self-pay | Admitting: *Deleted

## 2022-03-03 DIAGNOSIS — I77810 Thoracic aortic ectasia: Secondary | ICD-10-CM

## 2022-03-03 MED ORDER — IOPAMIDOL (ISOVUE-370) INJECTION 76%
75.0000 mL | Freq: Once | INTRAVENOUS | Status: AC | PRN
  Administered 2022-03-03: 75 mL via INTRAVENOUS

## 2022-03-07 DIAGNOSIS — R972 Elevated prostate specific antigen [PSA]: Secondary | ICD-10-CM | POA: Diagnosis not present

## 2022-03-10 ENCOUNTER — Encounter: Payer: Self-pay | Admitting: Internal Medicine

## 2022-03-17 ENCOUNTER — Other Ambulatory Visit: Payer: Self-pay | Admitting: General Practice

## 2022-03-21 ENCOUNTER — Ambulatory Visit (INDEPENDENT_AMBULATORY_CARE_PROVIDER_SITE_OTHER): Payer: Medicare Other | Admitting: Podiatry

## 2022-03-21 ENCOUNTER — Encounter: Payer: Self-pay | Admitting: Podiatry

## 2022-03-21 DIAGNOSIS — D689 Coagulation defect, unspecified: Secondary | ICD-10-CM

## 2022-03-21 DIAGNOSIS — L97522 Non-pressure chronic ulcer of other part of left foot with fat layer exposed: Secondary | ICD-10-CM | POA: Diagnosis not present

## 2022-03-21 NOTE — Progress Notes (Signed)
  Subjective:  [name], male    DOB: 29-Feb-1946,   MRN: [id number]  Chief Complaint  Patient presents with   Callouses      L callus, great toe, bloody    76 y.o. male presents for concern of left great toe pre-ulcerative area. Concerned that over the past weeks it has worsened.   Has been in the care of Dr. Jacqualyn Posey and most recently area had remained healed at last visit in February.   Denies any other pedal complaints. Denies n/v/f/c.   Past Medical History:  Diagnosis Date   Aortic root enlargement (HCC)    CAD (coronary artery disease)    Complication of anesthesia    Hypotensive with general anesth.   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 CFT <3 seconds. Normal hair growth on digits. No edema.  Skin. No lacerations or abrasions bilateral feet. Left plantar hallux with hyperkeratosis upon debridement 0.7 cm x0.9 cm x 0.2 cm wound noted with granular base. No surrounding erythema edema or purulence noted. No probe to bone.  Musculoskeletal: MMT 5/5 bilateral lower extremities in DF, PF, Inversion and Eversion. Deceased ROM in DF of ankle joint.  Neurological: Sensation intact to light touch.   Assessment:   1. Skin ulcer of great toe with fat layer exposed, left ([place])   2. Coagulation defect ([place])      Plan:  Patient was evaluated and treated and all questions answered. Ulcer left plantar hallux with fat layer exposed  -Debridement as below. -Dressed with silvadene, DSD. -Off-loading with surgical shoe. -No abx indicated.  -Discussed glucose control and proper protein-rich diet.  -Discussed if any worsening redness, pain, fever or chills to  call or may need to report to the emergency room. Patient expressed understanding.   Procedure: Excisional Debridement of Wound Rationale: Removal of non-viable soft tissue from the wound to promote healing.  Anesthesia: none Pre-Debridement Wound Measurements: Overylying hyperkeratosis  Post-Debridement Wound Measurements: 0.7 cm x 0.9 cm x 0.2 cm  Type of Debridement: Sharp Excisional Tissue Removed: Non-viable soft tissue Depth of Debridement: subcutaneous tissue. Technique: Sharp excisional debridement to bleeding, viable wound base.  Dressing: Dry, sterile, compression dressing. Disposition: Patient tolerated procedure well. Patient to return in 2 week for follow-up.  Return in about 2 weeks (around 04/04/2022).   Lorenda Peck, DPM

## 2022-04-04 ENCOUNTER — Ambulatory Visit (INDEPENDENT_AMBULATORY_CARE_PROVIDER_SITE_OTHER): Payer: Medicare Other | Admitting: Podiatry

## 2022-04-04 DIAGNOSIS — L84 Corns and callosities: Secondary | ICD-10-CM

## 2022-04-07 NOTE — Progress Notes (Signed)
Subjective: 76 year old male presents the office today for follow-up evaluation of a wound on his right big toe.  Since the last saw him he did follow-up with Dr. Blenda Mounts as the area became very thick and had a wound.  He states he is doing better.  He feels that using a pumice stone makes the callus buildup more so he stopped using this.  No drainage or pus.  No swelling or redness.  No fevers or chills.  No other concerns.   He is currently on Plavix as well as Xarelto due to cardiac stent placed.  Objective: AAO x3, NAD DP/PT pulses palpable bilaterally, CRT less than 3 seconds Preulcerative area noted on the medial aspect of the hallux IPJ.  There is dried blood present.  Upon debridement the area is preulcerative but no significant skin breakdown.  No edema no erythema.  No probing, undermining or tunneling.  No fluctuation or crepitation.  No malodor.  No pain with calf compression, swelling, warmth, erythema   Assessment: Preulcerative callus left hallux IPJ  Plan: -All treatment options discussed with the patient including all alternatives, risks, complications.  -Sharply debrided the hyperkeratotic lesion without any complications or bleeding.  Continue with daily dressing changes.  Continue offloading.  Monitor for any signs or symptoms of infection or any worsening of the wound.    Return in about 4 weeks (around 05/02/2022).   Trula Slade DPM

## 2022-04-16 ENCOUNTER — Ambulatory Visit: Payer: Medicare Other | Admitting: Internal Medicine

## 2022-04-22 ENCOUNTER — Ambulatory Visit (INDEPENDENT_AMBULATORY_CARE_PROVIDER_SITE_OTHER): Payer: Medicare Other | Admitting: Internal Medicine

## 2022-04-22 ENCOUNTER — Encounter (HOSPITAL_BASED_OUTPATIENT_CLINIC_OR_DEPARTMENT_OTHER): Payer: Self-pay | Admitting: Internal Medicine

## 2022-04-22 VITALS — BP 126/80 | HR 57 | Ht 73.0 in | Wt 236.4 lb

## 2022-04-22 DIAGNOSIS — Z8679 Personal history of other diseases of the circulatory system: Secondary | ICD-10-CM

## 2022-04-22 DIAGNOSIS — E785 Hyperlipidemia, unspecified: Secondary | ICD-10-CM | POA: Diagnosis not present

## 2022-04-22 DIAGNOSIS — Q210 Ventricular septal defect: Secondary | ICD-10-CM

## 2022-04-22 DIAGNOSIS — I480 Paroxysmal atrial fibrillation: Secondary | ICD-10-CM

## 2022-04-22 DIAGNOSIS — Z9889 Other specified postprocedural states: Secondary | ICD-10-CM

## 2022-04-22 DIAGNOSIS — I2510 Atherosclerotic heart disease of native coronary artery without angina pectoris: Secondary | ICD-10-CM | POA: Diagnosis not present

## 2022-04-22 NOTE — Progress Notes (Signed)
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 This is never caused issues in fact she's had right and left heart catheterizations dating back to the 1970s. 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 Other coronary risk factors include hypertension, dyslipidemia and family history of heart disease. He was sent for cardiac catheterization in 2006 for follow-up of VSD, at which time he had an echocardiogram which showed mild aortic regurgitation, a small membranous VSD and left atrial enlargement with an EF of 70%. He had a stress test at that time which showed findings concerning for him for a septal and inferoapical ischemia. He therefore was referred to left heart catheterization with strep demonstrated a 50-70% mid LAD stenosis and mild disease of the mid circumflex and first obtuse marginal branches. He's been managed medically since that time and previously saw Dr. Daneen Schick.  Recently he's been having some symptoms of sweating, palpitations and dizziness and eventually was found to be in atrial fibrillation when he presented to the hospital. At that time his troponin was elevated. He is placed on diltiazem which slowed his heart rate and he converted back to sinus spontaneously. Based on his elevated troponin, he was referred for left heart catheterization by Dr. Doylene Canard. This demonstrated a 60-70% mid LAD stenosis and a 90% obtuse marginal stenosis. Subsequently he underwent coronary intervention with placement of a 2.5 x 15 mm Xience Alpine stent to the mid OM. After this he became asymptomatic. He was going to have stress testing to determine the significance of his mid LAD lesion however it appears to be not significantly changed since his  heart catheterization 2006. He reports no further symptoms of atrial fibrillation but is concerned about bradycardia. He was not referred for cardiac rehabilitation due to the thought that he may need additional intervention to the LAD.  Mr. [name] returns today for follow-up. She's done quite well in cardiac rehabilitation. At his last office visit we discontinued his diltiazem for bradycardia. He still has a low heart rate which has some degree of chronotropic incompetence. He reports no recurrent atrial fibrillation that were aware of. He continues on dual antiplatelet therapy. He had a stress test prior to enrolling in cardiac redilatation which was negative for ischemia.  I saw Mr. Want back in the office today. He is accompanied by his wife who was a cardiac nurse at the [employer] in [place] for a number years in the 1970s. They brought in pronounced of his heart rate monitor and had several questions about his heart rate and arrhythmias. My interpretation of the monitor indicates that he had some episodes of short bursts of atrial tachycardia but no evidence for A. fib. He also has PACs. Heart rate however was a low and remained that way despite stopping his calcium channel blocker. He was recently in the emergency room and was noted to have some paroxysmal tachycardia in the ER with a right bundle-branch pattern. This is what led to monitor placement. He reports fatigue which could be certainly related to his lower heart rate. He also says that he's developed gynecomastia which he relates to [place]. I've done a search on the medicine and do not see that however he reports  in the product labeling.  03/21/2016  Mr. Schwegler was seen today in the office for an acute visit. He noted this morning shortly after waking up his heart rate was elevated in the low 100s. It improved somewhat and then around 9:00 it increased up to about 130. The heart rate is been stable since then and he called in for a new acute  triage visit. He reports feeling somewhat fatigued and noting that his heart rate is elevated. He denies any worsening shortness of breath or chest pain. EKG in the office shows atrial flutter with a regular rhythm at 131 bpm. As per recent notes he was previous on aspirin and clopidogrel but the caput ago was stopped. He was not on triple therapy. Currently he is only on low-dose aspirin. 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 I'm concerned about up titrating his medications because she's had history of slow ventricular rates in the past. We discussed a number of options but ultimately the best option is for him to undergo cardioversion to get back into normal rhythm.  04/17/2016  Mr. [name] was seen back in the office today for follow-up. At his last office visit he was noted to be in atrial flutter with a rate of 131. He was referred directly to the ER and underwent cardioversion after being started on Xarelto. He is remained on Xarelto since then without any bleeding problems. I'm happy to say he is maintaining sinus rhythm today. He does report significant fatigue however. He is mostly compliant with CPAP however he had a sleep study a number of years ago and he is set possibly to low of an airway setting. He's had weight gain since then. He also has been having some bradycardia with heart rates in the 40s and 50s. He is on low-dose beta blocker but I been hesitant to stop that because of his RVR. He does report heart rate increases with exercise.   06/20/2016  Mr. Forget returns today for follow-up. He underwent a sleep study on 06/05/2016. He did have a diagnosis of sleep apnea however that study indicated a very low AHI of 2.5 events per hour. He had actually about 25 events per hour in rem sleep, but overall the REM sleep latency was only about 8%. Ultimately he was diagnosis not having any significant apnea. This is led to some confusion as he previously was  wearing [place] CPAP device. He reports no change in his symptoms of fatigue which we thought could be related to sleep apnea. It also may be related to bradycardia and his beta blocker.  09/23/2016  Mr. Brandl comes back today and is doing well. His fatigue has improved somewhat with discontinuing his B-Blocker. Heartrate is increased. I spoke with Dr. Claiborne Billings about the possibility of OSA and he is not felt to have this. Dr. Claiborne Billings noted that RLS was present. He does not desire treatment for this. Blood pressure was somewhat elevated today. EKG shows sinus with PAC's. I reviewed a spreadsheet of his blood pressures today.  03/31/2017  Mr. Beauchaine was seen today in follow-up. Overall he is doing very well. He denies any chest pain worsening shortness of breath. Blood pressure is well-controlled. He brought in a graft indicating excellent control blood pressure and heart rate. He said no breakthrough A. fib. His recent lab work was provided by his PCP. Total cholesterol 134, trig Richards 85, HDL-C 49, LDL-C 68, non-HDL of 85. This represents excellent control. He is  planning a trip out [place] is been provided some Diamox to use as needed. He denies any bleeding problems on Xarelto.  09/03/2017  Mr. Bogie returns today for follow-up.  He underwent a recent stress test after I saw him in the emergency department for chest pain.  At the time he took some nitroglycerin which resulted in improvement in his symptoms however ruled out for MI by negative troponins.  His stress test demonstrated a small area of reversible ischemia in the inferolateral wall.  It was interpreted as intermediate risk.  Since then he has had no further chest pain.  I discussed the findings with him today and we talked about various options including medical therapy and/or repeat cardiac catheterization.  As he remains asymptomatic, I do not feel that medication adjustments necessary at this time.  Finally, he notes that he has had some  morning hypertension which is more significant than his daytime readings.  10/15/2017  Bradycardia returns today for follow-up.  As previously noted he had a mildly abnormal stress test and was having some chest discomfort.  That is completely resolved.  We have moved around his medications a little bit but he notes that his blood pressure still remains elevated at times during the day.  Echo was performed on 10/01/2017 which showed LVEF that was higher at 60-65%, stable dilated aortic root at 4.4 cm and the ascending aorta measured 4.0 cm.  No regional wall motion abnormalities were noted.  01/15/2018  Mr. Silvestro returns today for follow-up of his blood pressure.  He reports after adjusting his medicines that in general his blood pressures been well controlled.  He denies any recurrent episodes of dizziness or lightheadedness.  Recently had some testing of testosterone.  He has had a number of urologic issues and possible damage to blood flow to 1 of the testicles.  That coupled with age I suspect his lead to low testosterone.  Recently as low as the 200s.  His urologist Dr. Junious Silk, is recommending supplemental testosterone.  He is asking for my cardiac opinion whether this is safe to use.  07/19/2018  Mr. Sellman returns today for hospital follow-up.  Fortunately was seen in the ER for acute onset A. fib with RVR.  This is in the setting of what appears to be marked dehydration.  He was working outside in the heat and developed acute renal failure.  Potassium was up to 6.1.  He responded to hydration and repeat renal profile last week showed that his creatinine returned to baseline.  Potassium was actually slightly low at 3.3.  He is not on repletion.  I stopped his chlorthalidone and reduced his ARB to 160 mg valsartan daily.  He denies any recurrent A. Fib.  11/23/2018  Mr. Macleod seen today for labile blood pressures.  He recently messaged the office and was noted to have elevated blood pressures.   Previously had been on higher dose blood pressure medication however this was reduced including stopping chlorthalidone and reducing his ARB because of hyperkalemia.  The decrease in ARB probably was the reason why his blood pressures have remained elevated.  His wife is also concerned about a wider pulse pressure which at times can be close to 100 mmHg.  Despite this diastolics have not been below 50.  EKG shows sinus rhythm with sinus arrhythmia incomplete right bundle branch today at 66.  02/22/2020  Mr. Eick seen today in follow-up.  Overall he seems to be doing well.  He had some  recent surgery including a brow/lid lift.  He is also had a toe fusion.  Of note, he was noted to be tachycardic today and he seemed to be unaware of this.  EKG was performed which showed A. fib with RVR.  He has had this in the past and is spontaneously converted, in fact once we set him up for cardioversion.  He is on Xarelto and low-dose diltiazem.  He is scheduled to have an MRI tomorrow of his shoulder with a plan for upcoming shoulder surgery.  05/10/2020  Mr. Moen seen today in follow-up.  EKG shows he is maintaining sinus rhythm.  He underwent successful left shoulder surgery.  He is slow to recover from this and still has some pain.  Blood pressure is elevated today.  He was off of Xarelto for short period of time but then restarted it.  I also readjusted his medications including increasing his diltiazem and decreasing the valsartan.  If possible we may need to go back up on the valsartan however I would like for him to monitor his blood pressure.  07/19/2021  Mr. Himebaugh is seen today in follow-up.  He seems to be doing well still after PCI in May of this year.  He is transition from aspirin Plavix and Xarelto to Plavix and Xarelto which we will continue at least until May 2023.  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  He does have some aortic insufficiency which we will continue to follow.  He will be due for repeat CT again next year.  He denies any chest pain.  EKG shows sinus bradycardia with some PVCs today.  Blood pressure is well controlled.  Lipids are at goal as of May showing total cholesterol 104, HDL 37, triglycerides 50 and LDL 57.  04/22/2022  Mr. Fleek returns today for follow-up.  Overall he is doing well.  He had a repeat CT scan in May of this year which showed an ectatic aorta measuring 4 cm at the root and the ascending aorta only measured 3.8 cm.  Therefore no evidence of any significant aneurysm.  He denies chest pain or shortness of breath.  EKG today is stable with a sinus bradycardia.  Blood pressure is well controlled.  He is due for repeat lipids which were last assessed in May 2022.  PMHx:  Past Medical History:  Diagnosis Date   Aortic root enlargement (HCC)    CAD (coronary artery disease)    Complication of anesthesia    Hypotensive with general anesth.   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    Ventricular septal defect     Past Surgical History:  Procedure Laterality Date   ATRIAL FIBRILLATION ABLATION N/A 07/20/2020   Procedure: ATRIAL FIBRILLATION ABLATION;  Surgeon: [name], MD;  Location: [facility];  Service: Cardiovascular;  Laterality: N/A;   CARDIAC CATHETERIZATION     CARDIOVERSION N/A 02/29/2020   Procedure: CARDIOVERSION;  Surgeon: [name], MD;  Location: [facility];  Service: Cardiovascular;  Laterality: N/A;   CARDIOVERSION N/A 06/28/2020   Procedure: CARDIOVERSION;  Surgeon: Thayer Headings, MD;  Location:  [facility];  Service: Cardiovascular;  Laterality: N/A;   CORONARY STENT INTERVENTION N/A 03/18/2021    Procedure: CORONARY STENT INTERVENTION;  Surgeon: Burnell Blanks, MD;  Location: [facility];  Service: Cardiovascular;  Laterality: N/A;   DISTAL INTERPHALANGEAL JOINT FUSION Left 01/19/2019   Procedure: HALLUX INTERPHANGEAL JOINT FUSION LEFT FOOT;  Surgeon: Trula Slade, DPM;  Location: [facility];  Service: Podiatry;  Laterality: Left;  LEFT BLOCK   hipospedious     LEFT HEART CATH AND CORONARY ANGIOGRAPHY N/A 03/18/2021   Procedure: LEFT HEART CATH AND CORONARY ANGIOGRAPHY;  Surgeon: Burnell Blanks, MD;  Location: [facility];  Service: Cardiovascular;  Laterality: N/A;   LEFT HEART CATHETERIZATION WITH CORONARY ANGIOGRAM N/A 08/14/2014   Procedure: LEFT HEART CATHETERIZATION WITH CORONARY ANGIOGRAM;  Surgeon: Birdie Riddle, MD;  Location: [facility];  Service: Cardiovascular;  Laterality: N/A;   MASS EXCISION Left 01/19/2019   Procedure: EXCISION BENIGN LESION 2.OCM LEFT FOOT;  Surgeon: Trula Slade, DPM;  Location: [facility];  Service: Podiatry;  Laterality: Left;   SHOULDER ARTHROSCOPY WITH SUBACROMIAL DECOMPRESSION Left 05/03/2020   Procedure: SHOULDER ARTHROSCOPY DEBRIDEMENT PARTIAL ROTATOR CUFF REPAIR, SUBACROMIAL DECOMPRESSION, BICEPS TENOTOMY;  Surgeon: Tania Ade, MD;  Location: [facility];  Service: Orthopedics;  Laterality: Left;   SHOULDER SURGERY     TONSILLECTOMY  1955    FAMHx:  Family History  Problem Relation Age of Onset   Pancreatic cancer Mother    Aortic aneurysm Maternal Grandmother    Heart attack Maternal Grandfather    Diabetes Paternal Grandfather    CAD Paternal Grandfather     SOCHx:   reports that he has never smoked. He has never used smokeless tobacco. He reports that he does not drink alcohol and does not use drugs.  ALLERGIES:  Allergies  Allergen Reactions   Ciprofloxacin     Other reaction(s): bulging aorta   Codeine Nausea Only    Can tolerate with benadryl   Diltiazem Cd [Diltiazem Hcl]     Other  reaction(s): Bradycardia   Mupirocin Itching   Neosporin [Bacitracin-Polymyxin B]     Other reaction(s): rash   Other     Other reaction(s): rash   Tramadol Hcl     Other reaction(s): rash   Hydrocodone Rash    Can tolerate with benadryl   Neosporin [Neomycin-Bacitracin Zn-Polymyx] Rash   Oxycodone Rash    Can tolerate with benadryl    ROS: Pertinent items noted in HPI and remainder of comprehensive ROS otherwise negative.  HOME MEDS: Current Outpatient Medications  Medication Sig Dispense Refill   acetaminophen (TYLENOL) 500 MG tablet Take 1,000 mg by mouth every 6 (six) hours as needed for mild pain or headache.     albuterol (VENTOLIN HFA) 108 (90 Base) MCG/ACT inhaler 2 puffs as needed     alendronate (FOSAMAX) 70 MG tablet Take 70 mg by mouth every Wednesday. Take with a full glass of water on an empty stomach.     allopurinol (ZYLOPRIM) 100 MG tablet Take 200 mg by mouth daily.   0   atorvastatin (LIPITOR) 80 MG tablet TAKE 1 TABLET BY MOUTH DAILY AT 6 PM 90 tablet 3   COLCRYS 0.6 MG tablet Take 0.6 mg by mouth daily as needed (FOR GOUT).      ferrous sulfate 325 (65 FE) MG tablet 1 tablet     ketotifen (ZADITOR) 0.025 % ophthalmic solution Place 1 drop into both eyes as needed for allergies.  Light Mineral Oil-Mineral Oil (RETAINE MGD) 0.5-0.5 % EMUL Place 1 drop into both eyes in the morning, at noon, and at bedtime.     loratadine (CLARITIN) 10 MG tablet Take 10 mg by mouth daily as needed for allergies.     Multiple Vitamin (MULTIVITAMIN WITH MINERALS) TABS tablet Take 1 tablet by mouth daily. Centrum Silver     nitroGLYCERIN (NITROSTAT) 0.4 MG SL tablet PLACE 1 TAB UNDER TONGUE EVERY 5 MINS AS NEEDED FOR CHEST PAIN,MAX 3 DOSES 25 tablet 5   Omega-3 Fat Ac-Cholecalciferol (DRY EYE OMEGA BENEFITS/VIT D-3 PO) Take 1 capsule by mouth 3 (three) times daily.     pantoprazole (PROTONIX) 40 MG tablet TAKE 1 TABLET BY MOUTH EVERY DAY 90 tablet 4   sildenafil (REVATIO) 20 MG  tablet Take 1 tablet by mouth as needed for erectile dysfunction.     testosterone cypionate (DEPOTESTOSTERONE CYPIONATE) 200 MG/ML injection Inject 200 mg into the muscle See admin instructions. Every other week     valsartan (DIOVAN) 160 MG tablet TAKE 1 TABLET BY MOUTH EVERY DAY 90 tablet 2   vitamin B-12 (CYANOCOBALAMIN) 500 MCG tablet 1 tablet     XARELTO 20 MG TABS tablet TAKE 1 TABLET BY MOUTH DAILY WITH SUPPER. 90 tablet 1   No current facility-administered medications for this visit.    LABS/IMAGING: No results found for this or any previous visit (from the past 48 hour(s)). No results found.  VITALS: BP 126/80   Pulse (!) 57   Ht '6\' 1"'$  (1.854 m)   Wt 236 lb 6.4 oz (107.2 kg)   SpO2 98%   BMI 31.19 kg/m   EXAM: General appearance: alert and no distress Neck: no carotid bruit, no JVD and thyroid not enlarged, symmetric, no tenderness/mass/nodules Lungs: clear to auscultation bilaterally Heart: irregularly irregular rhythm and tachycardic Abdomen: soft, non-tender; bowel sounds normal; no masses,  no organomegaly Extremities: extremities normal, atraumatic, no cyanosis or edema Pulses: 2+ and symmetric Skin: Skin color, texture, turgor normal. No rashes or lesions Neurologic: Grossly normal Psych: Pleasant  EKG: Sinus bradycardia with arrhythmia and incomplete right bundle branch block at 57-personally reviewed  ASSESSMENT: A. 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   Mr. Hudgins seems to be doing well without any chest pain or worsening shortness of breath.  He reported 120-minute episode of possible A-fib.  Otherwise he has done well.  His recent CT showed stable aortic ectasia without aneurysm.  Blood pressure is well controlled.  He is due for repeat lipid.  His VSD murmur is stable.  No changes to his medications.  Follow-up with me annually or sooner as necessary.  [name], MD, [facility] [place] Director of the Advanced Lipid Disorders &  Cardiovascular Risk Reduction Clinic Attending Cardiologist  Direct Dial: [phone]  Fax: [phone]  Website:  [email or web address]  Nadean Corwin Rodneisha Bonnet 04/22/2022, 3:50 PM

## 2022-04-22 NOTE — Patient Instructions (Signed)
Medication Instructions:  Your physician recommends that you continue on your current medications as directed. Please refer to the Current Medication list given to you today.  *If you need a refill on your cardiac medications before your next appointment, please call your pharmacy*   Lab Work: FASTING lab work to check cholesterol   If you have labs (blood work) drawn today and your tests are completely normal, you will receive your results only by: [place] (if you have MyChart) OR A paper copy in the mail If you have any lab test that is abnormal or we need to change your treatment, we will call you to review the results.   Testing/Procedures: NONE   Follow-Up: At [facility], you and your health needs are our priority.  As part of our continuing mission to provide you with exceptional heart care, we have created designated Provider Care Teams.  These Care Teams include your primary Cardiologist (physician) and Advanced Practice Providers (APPs -  Physician Assistants and Nurse Practitioners) who all work together to provide you with the care you need, when you need it.  We recommend signing up for the patient portal called "MyChart".  Sign up information is provided on this After Visit Summary.  MyChart is used to connect with patients for Virtual Visits (Telemedicine).  Patients are able to view lab/test results, encounter notes, upcoming appointments, etc.  Non-urgent messages can be sent to your provider as well.   To learn more about what you can do with MyChart, go to [email or web address].    Your next appointment:   1 year with Dr. [name]

## 2022-04-26 ENCOUNTER — Encounter: Payer: Self-pay | Admitting: Podiatry

## 2022-04-26 ENCOUNTER — Ambulatory Visit (INDEPENDENT_AMBULATORY_CARE_PROVIDER_SITE_OTHER): Payer: Medicare Other | Admitting: Podiatry

## 2022-04-26 DIAGNOSIS — N390 Urinary tract infection, site not specified: Secondary | ICD-10-CM | POA: Insufficient documentation

## 2022-04-26 DIAGNOSIS — R7301 Impaired fasting glucose: Secondary | ICD-10-CM | POA: Insufficient documentation

## 2022-04-26 DIAGNOSIS — E6609 Other obesity due to excess calories: Secondary | ICD-10-CM | POA: Insufficient documentation

## 2022-04-26 DIAGNOSIS — L97522 Non-pressure chronic ulcer of other part of left foot with fat layer exposed: Secondary | ICD-10-CM | POA: Diagnosis not present

## 2022-04-26 DIAGNOSIS — R55 Syncope and collapse: Secondary | ICD-10-CM | POA: Insufficient documentation

## 2022-04-26 DIAGNOSIS — Q549 Hypospadias, unspecified: Secondary | ICD-10-CM | POA: Insufficient documentation

## 2022-04-26 DIAGNOSIS — Z860100 Personal history of colon polyps, unspecified: Secondary | ICD-10-CM | POA: Insufficient documentation

## 2022-04-26 DIAGNOSIS — Z955 Presence of coronary angioplasty implant and graft: Secondary | ICD-10-CM | POA: Insufficient documentation

## 2022-04-26 DIAGNOSIS — R7303 Prediabetes: Secondary | ICD-10-CM | POA: Insufficient documentation

## 2022-04-26 DIAGNOSIS — E785 Hyperlipidemia, unspecified: Secondary | ICD-10-CM | POA: Insufficient documentation

## 2022-04-26 DIAGNOSIS — S22000A Wedge compression fracture of unspecified thoracic vertebra, initial encounter for closed fracture: Secondary | ICD-10-CM | POA: Insufficient documentation

## 2022-04-26 DIAGNOSIS — M1A00X0 Idiopathic chronic gout, unspecified site, without tophus (tophi): Secondary | ICD-10-CM | POA: Insufficient documentation

## 2022-04-26 DIAGNOSIS — R946 Abnormal results of thyroid function studies: Secondary | ICD-10-CM | POA: Insufficient documentation

## 2022-04-26 DIAGNOSIS — F900 Attention-deficit hyperactivity disorder, predominantly inattentive type: Secondary | ICD-10-CM | POA: Insufficient documentation

## 2022-04-26 DIAGNOSIS — D689 Coagulation defect, unspecified: Secondary | ICD-10-CM

## 2022-04-28 ENCOUNTER — Ambulatory Visit: Payer: Medicare Other | Admitting: Internal Medicine

## 2022-05-05 ENCOUNTER — Other Ambulatory Visit: Payer: Self-pay | Admitting: Internal Medicine

## 2022-05-05 NOTE — Telephone Encounter (Signed)
Prescription refill request for Xarelto received.  Indication: PAF Last office visit: 04/22/22  Alma Friendly MD Weight: 107.2kg Age: 76 Scr: 0.91 on 02/24/22 CrCl: 107.99  Based on above findings Xarelto '20mg'$  daily is the appropriate dose.  Refill approved.

## 2022-05-15 ENCOUNTER — Ambulatory Visit (INDEPENDENT_AMBULATORY_CARE_PROVIDER_SITE_OTHER): Payer: Medicare Other | Admitting: Podiatry

## 2022-05-15 ENCOUNTER — Encounter: Payer: Self-pay | Admitting: Podiatry

## 2022-05-15 DIAGNOSIS — D689 Coagulation defect, unspecified: Secondary | ICD-10-CM

## 2022-05-15 DIAGNOSIS — L97522 Non-pressure chronic ulcer of other part of left foot with fat layer exposed: Secondary | ICD-10-CM

## 2022-05-15 NOTE — Progress Notes (Signed)
  Subjective:  [name], male    DOB: 1946/07/17,   MRN: [id number]  Chief Complaint  Patient presents with   Foot Ulcer     Left great toe ulcer    76 y.o. male presents for follow-up of left hallux wound. Relates it is doing better and has been dressing as instructed. Has a history of ulceration in this area.  Denies any other pedal complaints. Denies n/v/f/c.   Past Medical History:  Diagnosis Date   Aortic root enlargement (HCC)    CAD (coronary artery disease)    Complication of anesthesia    Hypotensive with general anesth.   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 CFT <3 seconds. Normal hair growth on digits. No edema.  Skin. No lacerations or abrasions bilateral feet. Ulcer to plantar and medial left hallux with granular base measures about 1 x 0.3 x 0.2 cm. No erythema or edema noted. No purulence noted.  Musculoskeletal: MMT 5/5 bilateral lower extremities in DF, PF, Inversion and Eversion. Deceased ROM in DF of ankle joint.  Neurological: Sensation intact to light touch.   Assessment:   1. Skin ulcer of great toe with fat layer exposed, left ([place])   2. Coagulation defect ([place])      Plan:  Patient was evaluated and treated and all questions answered. Ulcer left hallux with fat layer exposed  -Debridement as below. -Dressed with silvadene, DSD. -Off-loading with surgical shoe. -No abx indicated.  -Discussed glucose control and proper protein-rich diet.  -Discussed if any worsening redness, pain, fever or chills to call or may need to report to the emergency room. Patient expressed understanding.   Procedure: Excisional Debridement of  Wound Rationale: Removal of non-viable soft tissue from the wound to promote healing.  Anesthesia: none Pre-Debridement Wound Measurements:Overlying callus and skin slough Post-Debridement Wound Measurements: 1 cm x 0.3 cm x 0.2 cm  Type of Debridement: Sharp Excisional Tissue Removed: Non-viable soft tissue Depth of Debridement: subcutaneous tissue. Technique: Sharp excisional debridement to bleeding, viable wound base.  Dressing: Dry, sterile, compression dressing. Disposition: Patient tolerated procedure well. Patient to return in 2 week for follow-up.  Return in about 2 weeks (around 05/29/2022) for wound check.   Lorenda Peck, DPM

## 2022-05-22 ENCOUNTER — Other Ambulatory Visit: Payer: Self-pay | Admitting: Internal Medicine

## 2022-06-10 DIAGNOSIS — R972 Elevated prostate specific antigen [PSA]: Secondary | ICD-10-CM | POA: Diagnosis not present

## 2022-06-12 ENCOUNTER — Encounter: Payer: Self-pay | Admitting: Podiatry

## 2022-06-12 ENCOUNTER — Ambulatory Visit (INDEPENDENT_AMBULATORY_CARE_PROVIDER_SITE_OTHER): Payer: Medicare Other | Admitting: Podiatry

## 2022-06-12 DIAGNOSIS — L97522 Non-pressure chronic ulcer of other part of left foot with fat layer exposed: Secondary | ICD-10-CM

## 2022-06-12 DIAGNOSIS — D689 Coagulation defect, unspecified: Secondary | ICD-10-CM | POA: Diagnosis not present

## 2022-06-12 NOTE — Progress Notes (Signed)
  Subjective:  Patient ID: LAW CORSINO, male    DOB: [birth year],   MRN: [id number]  Chief Complaint  Patient presents with   Wound Check    2 WK WOUND F/U    76 y.o. male presents for follow-up of left hallux wound. Relates it has worsened. Has been at least four weeks since follow-up. Relates he started to develop a blood blister over the top of this ulcerated area. Currently on Plavix.   Denies any other pedal complaints. Denies n/v/f/c.   Past Medical History:  Diagnosis Date   Aortic root enlargement (HCC)    CAD (coronary artery disease)    Complication of anesthesia    Hypotensive with general anesth.   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 CFT <3 seconds. Normal hair growth on digits. No edema.  Skin. No lacerations or abrasions bilateral feet. Ulcer to plantar and medial left hallux with granular base measures about 1 x 0.5 x 0.2 cm Additional blood blister noted traching dorsally today with underlying skin well healed.  No erythema or edema noted. No purulence noted.  Musculoskeletal: MMT 5/5 bilateral lower extremities in DF, PF, Inversion and Eversion. Deceased ROM in DF of ankle joint.  Neurological: Sensation intact to light touch.   Assessment:   1. Skin ulcer of great toe with fat layer exposed, left ([place])   2. Coagulation defect ([place])       Plan:  Patient was evaluated and treated and all questions answered. Ulcer left hallux with fat layer exposed  -Debridement as below. -Dressed with silvadene, DSD. -Off-loading with surgical shoe. -No abx indicated.  -Discussed glucose control and proper protein-rich diet.  -Discussed if any worsening  redness, pain, fever or chills to call or may need to report to the emergency room. Patient expressed understanding.   Procedure: Excisional Debridement of Wound Rationale: Removal of non-viable soft tissue from the wound to promote healing.  Anesthesia: none Pre-Debridement Wound Measurements:Overlying callus and skin slough and blood blister Post-Debridement Wound Measurements: 1 cm x 0.5 cm x 0.2 cm  Type of Debridement: Sharp Excisional Tissue Removed: Non-viable soft tissue Depth of Debridement: subcutaneous tissue. Technique: Sharp excisional debridement to bleeding, viable wound base.  Dressing: Dry, sterile, compression dressing. Disposition: Patient tolerated procedure well. Patient to return in 2 week for follow-up.  Return in about 2 weeks (around 06/26/2022) for wound check.   Lorenda Peck, DPM

## 2022-06-27 ENCOUNTER — Encounter: Payer: Self-pay | Admitting: Podiatry

## 2022-06-27 ENCOUNTER — Ambulatory Visit (INDEPENDENT_AMBULATORY_CARE_PROVIDER_SITE_OTHER): Payer: Medicare Other | Admitting: Podiatry

## 2022-06-27 DIAGNOSIS — L97522 Non-pressure chronic ulcer of other part of left foot with fat layer exposed: Secondary | ICD-10-CM

## 2022-06-27 DIAGNOSIS — D689 Coagulation defect, unspecified: Secondary | ICD-10-CM

## 2022-06-27 NOTE — Progress Notes (Signed)
  Subjective:  [name], male    DOB: [birth year],   MRN: [id number]  No chief complaint on file.    76 y.o. male presents for follow-up of left hallux wound. Relates doing better. Has been dressing as instructed. Here today with his wife. Currently on Plavix.   Denies any other pedal complaints. Denies n/v/f/c.   Past Medical History:  Diagnosis Date   Aortic root enlargement (HCC)    CAD (coronary artery disease)    Complication of anesthesia    Hypotensive with general anesth.   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 CFT <3 seconds. Normal hair growth on digits. No edema.  Skin. No lacerations or abrasions bilateral feet. Ulcer to plantar and medial left hallux with granular base measures about 0.2 x 0.1 x 0.2 cm  No erythema or edema noted. No purulence noted.  Musculoskeletal: MMT 5/5 bilateral lower extremities in DF, PF, Inversion and Eversion. Deceased ROM in DF of ankle joint.  Neurological: Sensation intact to light touch.   Assessment:   1. Skin ulcer of great toe with fat layer exposed, left ([place])   2. Coagulation defect ([place])        Plan:  Patient was evaluated and treated and all questions answered. Ulcer left hallux with fat layer exposed  -Debridement as below. -Dressed with silvadene, DSD. -Off-loading with surgical shoe. -No abx indicated.  -Discussed glucose control and proper protein-rich diet.  -Discussed if any worsening redness, pain, fever or chills to call or may need to report to the emergency room. Patient expressed understanding.   Procedure: Excisional Debridement of Wound Rationale: Removal of non-viable soft tissue  from the wound to promote healing.  Anesthesia: none Pre-Debridement Wound Measurements:Overlying callus  Post-Debridement Wound Measurements: 0.2 cm x 0.1 cm x 0.2 cm  Type of Debridement: Sharp Excisional Tissue Removed: Non-viable soft tissue Depth of Debridement: subcutaneous tissue. Technique: Sharp excisional debridement to bleeding, viable wound base.  Dressing: Dry, sterile, compression dressing. Disposition: Patient tolerated procedure well. Patient to return in 2 week for follow-up.  Return in about 2 weeks (around 07/11/2022) for wound check.   Lorenda Peck, DPM

## 2022-06-30 DIAGNOSIS — R972 Elevated prostate specific antigen [PSA]: Secondary | ICD-10-CM | POA: Diagnosis not present

## 2022-06-30 DIAGNOSIS — R310 Gross hematuria: Secondary | ICD-10-CM | POA: Diagnosis not present

## 2022-07-02 ENCOUNTER — Other Ambulatory Visit: Payer: Self-pay | Admitting: Urology

## 2022-07-02 DIAGNOSIS — R972 Elevated prostate specific antigen [PSA]: Secondary | ICD-10-CM

## 2022-07-03 ENCOUNTER — Other Ambulatory Visit: Payer: Self-pay | Admitting: Urology

## 2022-07-03 DIAGNOSIS — R972 Elevated prostate specific antigen [PSA]: Secondary | ICD-10-CM

## 2022-07-09 DIAGNOSIS — R310 Gross hematuria: Secondary | ICD-10-CM | POA: Diagnosis not present

## 2022-07-10 DIAGNOSIS — Z1389 Encounter for screening for other disorder: Secondary | ICD-10-CM | POA: Diagnosis not present

## 2022-07-10 DIAGNOSIS — Z0000 Encounter for general adult medical examination without abnormal findings: Secondary | ICD-10-CM | POA: Diagnosis not present

## 2022-07-10 DIAGNOSIS — Z23 Encounter for immunization: Secondary | ICD-10-CM | POA: Diagnosis not present

## 2022-07-11 ENCOUNTER — Encounter: Payer: Self-pay | Admitting: Podiatry

## 2022-07-11 ENCOUNTER — Ambulatory Visit (INDEPENDENT_AMBULATORY_CARE_PROVIDER_SITE_OTHER): Payer: Medicare Other | Admitting: Podiatry

## 2022-07-11 DIAGNOSIS — L84 Corns and callosities: Secondary | ICD-10-CM | POA: Diagnosis not present

## 2022-07-11 DIAGNOSIS — D689 Coagulation defect, unspecified: Secondary | ICD-10-CM | POA: Diagnosis not present

## 2022-07-11 NOTE — Progress Notes (Signed)
  Subjective:  [name], male    DOB: May 28, 1946,   MRN: [id number]  Chief Complaint  Patient presents with   Foot Ulcer    2 wek follow up left great toe     76 y.o. male presents for follow-up of left hallux wound. Relates doing better. Has been dressing as instructed.  Currently on Plavix.   Denies any other pedal complaints. Denies n/v/f/c.   Past Medical History:  Diagnosis Date   Aortic root enlargement (HCC)    CAD (coronary artery disease)    Complication of anesthesia    Hypotensive with general anesth.   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 CFT <3 seconds. Normal hair growth on digits. No edema.  Skin. No lacerations or abrasions bilateral feet. Ulcer to plantar and medial left hallux  healed today.   No erythema or edema noted. No purulence noted.  Musculoskeletal: MMT 5/5 bilateral lower extremities in DF, PF, Inversion and Eversion. Deceased ROM in DF of ankle joint.  Neurological: Sensation intact to light touch.   Assessment:   1. Pre-ulcerative calluses   2. Coagulation defect [facility])        Plan:  Patient was evaluated and treated and all questions answered. Ulcer left hallux -healed.  -Debridement  of hyperkeratotic tissue with chisel without incident.  -Offloading with corn pads.  -No abx indicated.  -Discussed glucose control and proper protein-rich diet.  -Discussed if any worsening redness, pain, fever or chills to call or may need to report to the emergency room. Patient expressed understanding.  -Did discuss now that we are healed potential surgical options to reduce the pressure on the great toe. -Return in four weeks  for ulcer recheck and to discuss surgery.   Return in about 4 weeks (around 08/08/2022) for wound check.   Lorenda Peck, DPM

## 2022-07-20 ENCOUNTER — Ambulatory Visit
Admission: RE | Admit: 2022-07-20 | Discharge: 2022-07-20 | Disposition: A | Discharge status: Home / Self Care | Payer: Medicare Other | Source: Ambulatory Visit | Attending: Urology | Admitting: Urology

## 2022-07-20 DIAGNOSIS — R972 Elevated prostate specific antigen [PSA]: Secondary | ICD-10-CM

## 2022-07-20 MED ORDER — GADOBENATE DIMEGLUMINE 529 MG/ML IV SOLN
20.0000 mL | Freq: Once | INTRAVENOUS | Status: AC | PRN
  Administered 2022-07-20: 20 mL via INTRAVENOUS

## 2022-07-26 ENCOUNTER — Other Ambulatory Visit: Payer: Self-pay | Admitting: Internal Medicine

## 2022-08-05 DIAGNOSIS — E6609 Other obesity due to excess calories: Secondary | ICD-10-CM | POA: Diagnosis not present

## 2022-08-05 DIAGNOSIS — Z6830 Body mass index (BMI) 30.0-30.9, adult: Secondary | ICD-10-CM | POA: Diagnosis not present

## 2022-08-05 DIAGNOSIS — M1A0720 Idiopathic chronic gout, left ankle and foot, without tophus (tophi): Secondary | ICD-10-CM | POA: Diagnosis not present

## 2022-08-05 DIAGNOSIS — R7303 Prediabetes: Secondary | ICD-10-CM | POA: Diagnosis not present

## 2022-08-05 DIAGNOSIS — S22000A Wedge compression fracture of unspecified thoracic vertebra, initial encounter for closed fracture: Secondary | ICD-10-CM | POA: Diagnosis not present

## 2022-08-05 DIAGNOSIS — K219 Gastro-esophageal reflux disease without esophagitis: Secondary | ICD-10-CM | POA: Diagnosis not present

## 2022-08-19 ENCOUNTER — Telehealth: Payer: Self-pay | Admitting: Cardiovascular Disease

## 2022-08-19 ENCOUNTER — Encounter (HOSPITAL_BASED_OUTPATIENT_CLINIC_OR_DEPARTMENT_OTHER): Payer: Self-pay | Admitting: Internal Medicine

## 2022-08-19 NOTE — Telephone Encounter (Signed)
This has been sent to [facility] for recommendations

## 2022-08-19 NOTE — Telephone Encounter (Signed)
   Pre-operative Risk Assessment    Patient Name: Jose Stone  DOB: 1946/03/07 MRN: [id number]      Request for Surgical Clearance    Procedure:   MR prostate biopsy perfusion   Date of Surgery:  Clearance 08/22/22                                 Surgeon:  Dr. Festus Aloe  Surgeon's Group or Practice Name:  [facility]  Phone number:  [phone] Fax number:  [phone]   Type of Clearance Requested:   - Medical  - Pharmacy:  Hold Rivaroxaban (Xarelto)     Type of Anesthesia:  None    Additional requests/questions:      SignedMilbert Coulter   08/19/2022, 12:10 PM

## 2022-08-19 NOTE — Telephone Encounter (Signed)
Star with [facility] is following up regarding clearance. She states it was initially sent in September and has been sent to the office twice. They are needing to confirm whether or not the patient can hold his medication ASAP.

## 2022-08-19 NOTE — Telephone Encounter (Signed)
Tammy is calling back stating she needs to know about the patient holding his Xarelto by the end of the day to avoid having to reschedule his procedure. Tammy's ext was left for callback. Please advise.

## 2022-08-19 NOTE — Telephone Encounter (Signed)
Patient with diagnosis of afib on Xarelto for anticoagulation.    Procedure:  MR prostate biopsy perfusion  Date of procedure: 08/22/22   CHA2DS2-VASc Score = 3   This indicates a 3.2% annual risk of stroke. The patient's score is based upon: CHF History: 0 HTN History: 1 Diabetes History: 0 Stroke History: 0 Vascular Disease History: 1 Age Score: 1 Gender Score: 0      CrCl 91 ml/min  Per office protocol, patient can hold Xarelto for 3 days prior to procedure.    **This guidance is not considered finalized until pre-operative APP has relayed final recommendations.**

## 2022-08-19 NOTE — Telephone Encounter (Signed)
   Name: Jose Stone  DOB: 1946/02/19  MRN: [id number]   Primary Cardiologist: [name], MD  Chart reviewed as part of pre-operative protocol coverage. Patient was contacted 08/19/2022 in reference to pre-operative risk assessment for pending surgery as outlined below.  Jose Stone was last seen on 04/22/2022 by Dr [name].  Since that day, Jose Stone has done well from a cardiac standpoint.  He is any new symptoms or concerns.  He is able to complete greater than 4 METS without difficulty.  Therefore, based on ACC/AHA guidelines, the patient would be at acceptable risk for the planned procedure without further cardiovascular testing.   The patient was advised that if he develops new symptoms prior to surgery to contact our office to arrange for a follow-up visit, and he verbalized understanding.  Per office protocol, patient can hold Xarelto for 3 days prior to procedure. Please resume Xarelto as soon as possible postprocedure, at the discretion of the surgeon.   I will route this recommendation to the requesting party via Epic fax function and remove from pre-op pool. Please call with questions.  Lenna Sciara, NP 08/19/2022, 5:07 PM

## 2022-08-20 ENCOUNTER — Telehealth: Payer: Self-pay | Admitting: Internal Medicine

## 2022-08-20 NOTE — Telephone Encounter (Signed)
Surgical Clearance notes have been faxed.Marland KitchenMarland Kitchen

## 2022-08-20 NOTE — Telephone Encounter (Signed)
Tammy from [facility] is calling in regards to medical clearance for patient

## 2022-08-22 DIAGNOSIS — R972 Elevated prostate specific antigen [PSA]: Secondary | ICD-10-CM | POA: Diagnosis not present

## 2022-08-22 DIAGNOSIS — D075 Carcinoma in situ of prostate: Secondary | ICD-10-CM | POA: Diagnosis not present

## 2022-08-28 DIAGNOSIS — C61 Malignant neoplasm of prostate: Secondary | ICD-10-CM | POA: Diagnosis not present

## 2022-08-28 DIAGNOSIS — N35011 Post-traumatic bulbous urethral stricture: Secondary | ICD-10-CM | POA: Diagnosis not present

## 2022-08-28 DIAGNOSIS — R310 Gross hematuria: Secondary | ICD-10-CM | POA: Diagnosis not present

## 2022-08-29 DIAGNOSIS — H5203 Hypermetropia, bilateral: Secondary | ICD-10-CM | POA: Diagnosis not present

## 2022-08-29 DIAGNOSIS — H2513 Age-related nuclear cataract, bilateral: Secondary | ICD-10-CM | POA: Diagnosis not present

## 2022-09-04 ENCOUNTER — Ambulatory Visit (INDEPENDENT_AMBULATORY_CARE_PROVIDER_SITE_OTHER): Payer: Medicare Other | Admitting: Podiatry

## 2022-09-04 DIAGNOSIS — L84 Corns and callosities: Secondary | ICD-10-CM | POA: Diagnosis not present

## 2022-09-04 DIAGNOSIS — M216X2 Other acquired deformities of left foot: Secondary | ICD-10-CM | POA: Diagnosis not present

## 2022-09-04 DIAGNOSIS — C61 Malignant neoplasm of prostate: Secondary | ICD-10-CM | POA: Diagnosis not present

## 2022-09-04 DIAGNOSIS — L97521 Non-pressure chronic ulcer of other part of left foot limited to breakdown of skin: Secondary | ICD-10-CM | POA: Diagnosis not present

## 2022-09-04 NOTE — Progress Notes (Signed)
Subjective:  [name], male    DOB: [birth year],  MRN: [id number]  Chief Complaint  Patient presents with   Foot Problem    L GREAT TOE -BLOOD BLISTER FROM A CALLOUS. Onset was about 5 years ago. Patient has surgery on his left foot but it grows back. Patient is not diabetic.     76 y.o. male presents with concern for chronic callus and blood blister present at the left great toe.  He states he has had this for a long time.  He has previously seen other providers.  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  He has tried different offloading methods including gel toe caps and gel spacers as well as prior custom-made orthotics which have now worn out.  Past Medical History:  Diagnosis Date   Aortic root enlargement (HCC)    CAD (coronary artery disease)    Complication of anesthesia    Hypotensive with general anesth.   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    Ventricular septal defect     Allergies  Allergen Reactions   Ciprofloxacin     Other reaction(s): bulging aorta   Codeine Nausea Only    Can tolerate with benadryl   Diltiazem Cd [Diltiazem Hcl]     Other reaction(s): Bradycardia   Mupirocin Itching   Neosporin [Bacitracin-Polymyxin B]     Other reaction(s): rash   Other     Other reaction(s): rash   Tramadol Hcl     Other reaction(s): rash   Hydrocodone Rash    Can tolerate with benadryl   Neosporin [Neomycin-Bacitracin Zn-Polymyx] Rash   Oxycodone Rash    Can tolerate with benadryl    ROS: Negative except as per HPI above  Objective:  General: AAO x3, NAD  Dermatological: Tension to the left hallux IPJ there is no to be a dark hyperkeratotic lesion present with dried blood present underlying but no significant open wound  erythema drainage.  No sign of active infection.  Vascular:  Dorsalis Pedis artery and Posterior Tibial artery pedal pulses are 2/4 bilateral.  Capillary fill time < 3 sec to all digits.   Neruologic: Grossly intact via light touch bilateral. Protective threshold intact to all sites bilateral.   Musculoskeletal: No active or passive range of motion available at the left hallux interphalangeal joint which is fused.  Gait: Unassisted, Nonantalgic.      Assessment:   1. Skin ulcer of toe of left foot, limited to breakdown of skin ([place])   2. Pre-ulcerative calluses   3. Pronation deformity of left foot      Plan:  Patient was evaluated and treated and all questions answered.  #Hyperkeratotic lesion representing a preulcerative callus present at the plantar medial aspect of the left hallux IPJ All symptomatic hyperkeratoses were safely debrided with a sterile #15 blade to patient's level of comfort without incident. We discussed preventative and palliative care of these lesions including supportive and accommodative shoegear, padding, prefabricated and custom molded accommodative orthoses, use of a pumice stone and lotions/creams daily.  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 -Patient is agreeable and we will proceed with fabricating a new pair of custom-made orthotics with a offloading area including a reverse Morton's extension to take pressure off the left hallux interphalangeal joint. -Patient was casted for custom-made orthotics at this visit.   Return in about 8 weeks (around 10/30/2022) for follow up orthotics / left hallux callus.          Everitt Amber, DPM [facility]

## 2022-09-09 DIAGNOSIS — N3000 Acute cystitis without hematuria: Secondary | ICD-10-CM | POA: Diagnosis not present

## 2022-09-09 DIAGNOSIS — N35011 Post-traumatic bulbous urethral stricture: Secondary | ICD-10-CM | POA: Diagnosis not present

## 2022-09-24 ENCOUNTER — Other Ambulatory Visit: Payer: Self-pay

## 2022-09-24 ENCOUNTER — Emergency Department (HOSPITAL_COMMUNITY): Payer: Medicare Other

## 2022-09-24 ENCOUNTER — Observation Stay (HOSPITAL_COMMUNITY): Payer: Medicare Other

## 2022-09-24 ENCOUNTER — Encounter (HOSPITAL_COMMUNITY): Payer: Self-pay | Admitting: Internal Medicine

## 2022-09-24 ENCOUNTER — Observation Stay (HOSPITAL_COMMUNITY)
Admission: EM | Admit: 2022-09-24 | Discharge: 2022-09-25 | Disposition: A | Discharge status: Home / Self Care | Payer: Medicare Other | Attending: Internal Medicine | Admitting: Internal Medicine

## 2022-09-24 DIAGNOSIS — K219 Gastro-esophageal reflux disease without esophagitis: Secondary | ICD-10-CM | POA: Diagnosis present

## 2022-09-24 DIAGNOSIS — E785 Hyperlipidemia, unspecified: Secondary | ICD-10-CM | POA: Diagnosis present

## 2022-09-24 DIAGNOSIS — R079 Chest pain, unspecified: Secondary | ICD-10-CM | POA: Diagnosis not present

## 2022-09-24 DIAGNOSIS — M109 Gout, unspecified: Secondary | ICD-10-CM | POA: Diagnosis present

## 2022-09-24 DIAGNOSIS — I482 Chronic atrial fibrillation, unspecified: Secondary | ICD-10-CM | POA: Insufficient documentation

## 2022-09-24 DIAGNOSIS — E6609 Other obesity due to excess calories: Secondary | ICD-10-CM | POA: Diagnosis not present

## 2022-09-24 DIAGNOSIS — Z8546 Personal history of malignant neoplasm of prostate: Secondary | ICD-10-CM | POA: Diagnosis not present

## 2022-09-24 DIAGNOSIS — R0602 Shortness of breath: Secondary | ICD-10-CM | POA: Diagnosis not present

## 2022-09-24 DIAGNOSIS — Z7901 Long term (current) use of anticoagulants: Secondary | ICD-10-CM | POA: Diagnosis not present

## 2022-09-24 DIAGNOSIS — Z79899 Other long term (current) drug therapy: Secondary | ICD-10-CM | POA: Diagnosis not present

## 2022-09-24 DIAGNOSIS — J9811 Atelectasis: Secondary | ICD-10-CM | POA: Diagnosis not present

## 2022-09-24 DIAGNOSIS — J939 Pneumothorax, unspecified: Secondary | ICD-10-CM | POA: Diagnosis not present

## 2022-09-24 DIAGNOSIS — E7800 Pure hypercholesterolemia, unspecified: Secondary | ICD-10-CM | POA: Diagnosis not present

## 2022-09-24 DIAGNOSIS — I480 Paroxysmal atrial fibrillation: Secondary | ICD-10-CM | POA: Diagnosis not present

## 2022-09-24 DIAGNOSIS — Q210 Ventricular septal defect: Secondary | ICD-10-CM | POA: Diagnosis not present

## 2022-09-24 DIAGNOSIS — R0789 Other chest pain: Secondary | ICD-10-CM | POA: Diagnosis not present

## 2022-09-24 DIAGNOSIS — S2231XA Fracture of one rib, right side, initial encounter for closed fracture: Secondary | ICD-10-CM | POA: Diagnosis not present

## 2022-09-24 DIAGNOSIS — I2510 Atherosclerotic heart disease of native coronary artery without angina pectoris: Secondary | ICD-10-CM | POA: Diagnosis present

## 2022-09-24 DIAGNOSIS — Z955 Presence of coronary angioplasty implant and graft: Secondary | ICD-10-CM | POA: Diagnosis not present

## 2022-09-24 DIAGNOSIS — I10 Essential (primary) hypertension: Secondary | ICD-10-CM | POA: Diagnosis present

## 2022-09-24 DIAGNOSIS — R0689 Other abnormalities of breathing: Secondary | ICD-10-CM | POA: Diagnosis not present

## 2022-09-24 LAB — CBC WITH DIFFERENTIAL/PLATELET
Abs Immature Granulocytes: 0.01 10*3/uL (ref 0.00–0.07)
Basophils Absolute: 0 10*3/uL (ref 0.0–0.1)
Basophils Relative: 0 %
Eosinophils Absolute: 0.4 10*3/uL (ref 0.0–0.5)
Eosinophils Relative: 5 %
HCT: 45.1 % (ref 39.0–52.0)
Hemoglobin: 15.1 g/dL (ref 13.0–17.0)
Immature Granulocytes: 0 %
Lymphocytes Relative: 27 %
Lymphs Abs: 2.1 10*3/uL (ref 0.7–4.0)
MCH: 29.8 pg (ref 26.0–34.0)
MCHC: 33.5 g/dL (ref 30.0–36.0)
MCV: 89.1 fL (ref 80.0–100.0)
Monocytes Absolute: 0.6 10*3/uL (ref 0.1–1.0)
Monocytes Relative: 7 %
Neutro Abs: 4.8 10*3/uL (ref 1.7–7.7)
Neutrophils Relative %: 61 %
Platelets: 197 10*3/uL (ref 150–400)
RBC: 5.06 MIL/uL (ref 4.22–5.81)
RDW: 14.7 % (ref 11.5–15.5)
WBC: 7.9 10*3/uL (ref 4.0–10.5)
nRBC: 0 % (ref 0.0–0.2)

## 2022-09-24 LAB — LIPASE, BLOOD: Lipase: 44 U/L (ref 11–51)

## 2022-09-24 LAB — COMPREHENSIVE METABOLIC PANEL
ALT: 57 U/L — ABNORMAL HIGH (ref 0–44)
AST: 41 U/L (ref 15–41)
Albumin: 3.4 g/dL — ABNORMAL LOW (ref 3.5–5.0)
Alkaline Phosphatase: 84 U/L (ref 38–126)
Anion gap: 13 (ref 5–15)
BUN: 23 mg/dL (ref 8–23)
CO2: 24 mmol/L (ref 22–32)
Calcium: 9.4 mg/dL (ref 8.9–10.3)
Chloride: 103 mmol/L (ref 98–111)
Creatinine, Ser: 0.9 mg/dL (ref 0.61–1.24)
GFR, Estimated: >60 mL/min (ref >60)
Glucose, Bld: 143 mg/dL — ABNORMAL HIGH (ref 70–99)
Potassium: 4.3 mmol/L (ref 3.5–5.1)
Sodium: 140 mmol/L (ref 135–145)
Total Bilirubin: 0.7 mg/dL (ref 0.3–1.2)
Total Protein: 6.3 g/dL — ABNORMAL LOW (ref 6.5–8.1)

## 2022-09-24 LAB — TROPONIN I (HIGH SENSITIVITY)
Troponin I (High Sensitivity): 9 ng/L (ref <18)
Troponin I (High Sensitivity): 9 ng/L (ref <18)

## 2022-09-24 MED ORDER — POLYETHYLENE GLYCOL 3350 17 G PO PACK: 17.0000 g | PACK | Freq: Every day | ORAL | Status: DC | PRN

## 2022-09-24 MED ORDER — PANTOPRAZOLE SODIUM 40 MG PO TBEC
40.0000 mg | DELAYED_RELEASE_TABLET | Freq: Every day | ORAL | Status: DC
  Administered 2022-09-25: 40 mg via ORAL
  Filled 2022-09-24: qty 1

## 2022-09-24 MED ORDER — RIVAROXABAN 10 MG PO TABS: 20.0000 mg | ORAL_TABLET | Freq: Every day | ORAL | Status: DC

## 2022-09-24 MED ORDER — ACETAMINOPHEN 650 MG RE SUPP: 650.0000 mg | Freq: Four times a day (QID) | RECTAL | Status: DC | PRN

## 2022-09-24 MED ORDER — ATORVASTATIN CALCIUM 80 MG PO TABS
80.0000 mg | ORAL_TABLET | Freq: Every day | ORAL | Status: DC
Start: 2022-09-24 — End: 2022-09-25
  Administered 2022-09-24 – 2022-09-25 (×2): 80 mg via ORAL
  Filled 2022-09-24: qty 1
  Filled 2022-09-24: qty 2

## 2022-09-24 MED ORDER — RIVAROXABAN 20 MG PO TABS
20.0000 mg | ORAL_TABLET | Freq: Every day | ORAL | Status: DC
  Administered 2022-09-24: 20 mg via ORAL
  Filled 2022-09-24: qty 2

## 2022-09-24 MED ORDER — SODIUM CHLORIDE 0.9% FLUSH: 3.0000 mL | Freq: Two times a day (BID) | INTRAVENOUS | Status: DC

## 2022-09-24 MED ORDER — ACETAMINOPHEN 325 MG PO TABS: 650.0000 mg | ORAL_TABLET | Freq: Four times a day (QID) | ORAL | Status: DC | PRN

## 2022-09-24 MED ORDER — ALLOPURINOL 100 MG PO TABS
200.0000 mg | ORAL_TABLET | Freq: Every day | ORAL | Status: DC
  Administered 2022-09-25: 200 mg via ORAL
  Filled 2022-09-24: qty 2

## 2022-09-24 NOTE — ED Notes (Signed)
The  pt has been rejected from 6n because he is on a non-rebreather

## 2022-09-24 NOTE — ED Notes (Signed)
The pt returned from xray  no pain

## 2022-09-24 NOTE — ED Provider Notes (Signed)
[facility] EMERGENCY DEPARTMENT Provider Note   CSN: [id number] Arrival date & time: 09/24/22  1511     History  Chief Complaint  Patient presents with   Chest Pain    Jose Stone is a 76 y.o. male.  The history is provided by the patient and medical records. No language interpreter was used.  Chest Pain Pain location:  Substernal area and L chest Pain quality: aching, crushing, dull and pressure   Pain radiates to:  Does not radiate Pain severity:  Moderate Onset quality:  Gradual Duration:  20 minutes Timing:  Constant Progression:  Resolved Chronicity:  New Relieved by:  Nitroglycerin Worsened by:  Nothing Ineffective treatments:  None tried Associated symptoms: shortness of breath   Associated symptoms: no abdominal pain, no altered mental status, no back pain, no cough, no diaphoresis, no fatigue, no fever, no headache, no heartburn, no lower extremity edema, no nausea, no near-syncope, no numbness, no palpitations, no vomiting and no weakness   Risk factors: aortic disease and coronary artery disease        Home Medications Prior to Admission medications   Medication Sig Start Date End Date Taking? Authorizing Provider  acetaminophen (TYLENOL) 500 MG tablet Take 1,000 mg by mouth every 6 (six) hours as needed for mild pain or headache.    [provider]  albuterol (VENTOLIN HFA) 108 (90 Base) MCG/ACT inhaler 2 puffs as needed 11/01/21   [provider]  alendronate (FOSAMAX) 70 MG tablet Take 70 mg by mouth every Wednesday. Take with a full glass of water on an empty stomach.    [provider]  allopurinol (ZYLOPRIM) 100 MG tablet Take 200 mg by mouth daily.  03/26/18   [provider]  atorvastatin (LIPITOR) 80 MG tablet TAKE 1 TABLET BY MOUTH DAILY AT 6 PM 07/28/22   Hilty, Nadean Corwin, MD  COLCRYS 0.6 MG tablet Take 0.6 mg by mouth daily as needed (FOR GOUT).  05/26/14   [provider]  ferrous  sulfate 325 (65 FE) MG tablet 1 tablet 03/26/21   [provider]  ketotifen (ZADITOR) 0.025 % ophthalmic solution Place 1 drop into both eyes as needed for allergies.    [provider]  Light Mineral Oil-Mineral Oil (RETAINE MGD) 0.5-0.5 % EMUL Place 1 drop into both eyes in the morning, at noon, and at bedtime.    [provider]  loratadine (CLARITIN) 10 MG tablet Take 10 mg by mouth daily as needed for allergies.    [provider]  Multiple Vitamin (MULTIVITAMIN WITH MINERALS) TABS tablet Take 1 tablet by mouth daily. Centrum Silver    [provider]  nitroGLYCERIN (NITROSTAT) 0.4 MG SL tablet PLACE 1 TAB UNDER TONGUE EVERY 5 MINS AS NEEDED FOR CHEST PAIN,MAX 3 DOSES 03/19/21   Hilty, Nadean Corwin, MD  Omega-3 Fat Ac-Cholecalciferol (DRY EYE OMEGA BENEFITS/VIT D-3 PO) Take 1 capsule by mouth 3 (three) times daily.    [provider]  pantoprazole (PROTONIX) 40 MG tablet TAKE 1 TABLET BY MOUTH EVERY DAY 03/17/22   Hilty, Nadean Corwin, MD  sildenafil (REVATIO) 20 MG tablet Take 1 tablet by mouth as needed for erectile dysfunction.    [provider]  testosterone cypionate (DEPOTESTOSTERONE CYPIONATE) 200 MG/ML injection Inject 200 mg into the muscle See admin instructions. Every other week 01/29/21   [provider]  valsartan (DIOVAN) 160 MG tablet TAKE 1 TABLET BY MOUTH EVERY DAY 05/22/22   Hilty, Nadean Corwin,  MD  vitamin B-12 (CYANOCOBALAMIN) 500 MCG tablet 1 tablet 03/26/21   [provider]  XARELTO 20 MG TABS tablet TAKE 1 TABLET BY MOUTH DAILY WITH SUPPER 05/05/22   Hilty, Nadean Corwin, MD      Allergies    Ciprofloxacin, Codeine, Diltiazem cd [diltiazem hcl], Mupirocin, Neosporin [bacitracin-polymyxin b], Other, Tramadol hcl, Hydrocodone, Neosporin [neomycin-bacitracin zn-polymyx], and Oxycodone    Review of Systems   Review of Systems  Constitutional:  Negative for chills, diaphoresis, fatigue and fever.  HENT:   Negative for congestion.   Respiratory:  Positive for shortness of breath. Negative for cough, chest tightness and wheezing.   Cardiovascular:  Positive for chest pain. Negative for palpitations, leg swelling and near-syncope.  Gastrointestinal:  Negative for abdominal pain, constipation, diarrhea, heartburn, nausea and vomiting.  Genitourinary:  Negative for flank pain and frequency.  Musculoskeletal:  Negative for back pain, neck pain and neck stiffness.  Skin:  Negative for rash and wound.  Neurological:  Negative for seizures, weakness, light-headedness, numbness and headaches.  Psychiatric/Behavioral:  Negative for agitation and confusion.   All other systems reviewed and are negative.   Physical Exam Updated Vital Signs BP 138/88   Pulse 71   Temp 98.3 F (36.8 C)   Resp 18   Ht '6\' 1"'$  (1.854 m)   Wt 107.2 kg   SpO2 95%   BMI 31.18 kg/m  Physical Exam Vitals and nursing note reviewed.  Constitutional:      General: He is not in acute distress.    Appearance: He is well-developed. He is not ill-appearing, toxic-appearing or diaphoretic.  HENT:     Head: Normocephalic and atraumatic.  Eyes:     Extraocular Movements: Extraocular movements intact.     Conjunctiva/sclera: Conjunctivae normal.     Pupils: Pupils are equal, round, and reactive to light.  Cardiovascular:     Rate and Rhythm: Normal rate and regular rhythm.     Heart sounds: Murmur heard.  Pulmonary:     Effort: Pulmonary effort is normal. No respiratory distress.     Breath sounds: Normal breath sounds. No wheezing, rhonchi or rales.  Chest:     Chest wall: No tenderness.  Abdominal:     Palpations: Abdomen is soft.     Tenderness: There is no abdominal tenderness.  Musculoskeletal:        General: No swelling.     Cervical back: Neck supple.     Right lower leg: No tenderness. No edema.     Left lower leg: No tenderness. No edema.  Skin:    General: Skin is warm and dry.     Capillary Refill:  Capillary refill takes less than 2 seconds.  Neurological:     Mental Status: He is alert.  Psychiatric:        Mood and Affect: Mood normal.     ED Results / Procedures / Treatments   Labs (all labs ordered are listed, but only abnormal results are displayed) Labs Reviewed  COMPREHENSIVE METABOLIC PANEL - Abnormal; Notable for the following components:      Result Value   Glucose, Bld 143 (*)    Total Protein 6.3 (*)    Albumin 3.4 (*)    ALT 57 (*)    All other components within normal limits  CBC WITH DIFFERENTIAL/PLATELET  LIPASE, BLOOD  COMPREHENSIVE METABOLIC PANEL  CBC  TROPONIN I (HIGH SENSITIVITY)  TROPONIN I (HIGH SENSITIVITY)    EKG EKG Interpretation  Date/Time:  Wednesday September 24 2022 15:15:19 EST Ventricular Rate:  68 PR Interval:  66 QRS Duration: 122 QT Interval:  412 QTC Calculation: 439 R Axis:   -28 Text Interpretation: Unknown rhythm, irregular rate Short PR interval IVCD, consider atypical RBBB when compared to prior, similar appearance with more artifact. No STEMI Confirmed by Antony Blackbird [id number]) on 09/24/2022 3:27:44 PM  Radiology DG Chest 2 View  Result Date: 09/24/2022 CLINICAL DATA:  Chest pain EXAM: CHEST - 2 VIEW COMPARISON:  CT 03/03/2022 FINDINGS: Small volume RIGHT pneumothorax is noted. Pleural edge measures 20 mm from the RIGHT apical chest wall. Potential sub pulmonic component of the pneumothorax on the RIGHT. No midline shift. Mild basilar atelectasis. IMPRESSION: Small volume RIGHT pneumothorax.  No mediastinal shift. Electronically Signed   By: Suzy Bouchard M.D.   On: 09/24/2022 16:57    Procedures Procedures    Medications Ordered in ED Medications  allopurinol (ZYLOPRIM) tablet 200 mg (has no administration in time range)  atorvastatin (LIPITOR) tablet 80 mg (has no administration in time range)  pantoprazole (PROTONIX) EC tablet 40 mg (has no administration in time range)  rivaroxaban (XARELTO) tablet 20 mg (has  no administration in time range)  sodium chloride flush (NS) 0.9 % injection 3 mL (has no administration in time range)  acetaminophen (TYLENOL) tablet 650 mg (has no administration in time range)    Or  acetaminophen (TYLENOL) suppository 650 mg (has no administration in time range)  polyethylene glycol (MIRALAX / GLYCOLAX) packet 17 g (has no administration in time range)    ED Course/ Medical Decision Making/ A&P                           Medical Decision Making Amount and/or Complexity of Data Reviewed Labs: ordered. Radiology: ordered.  Risk Decision regarding hospitalization.    Jose Stone is a 76 y.o. male with a complex past medical history including hypertension, hyperlipidemia, GERD, CAD with PCI, VSD with persistent murmur, dilated aortic root, paroxysmal atrial fibrillation on Xarelto, and recent diagnosis of prostate cancer who presents with chest pain.  According to patient, he was sitting relaxing in a lazy boy chair when he started having crushing chest pain across his chest.  He reports it did not go to his back and was not sharp.  He reports he had some shortness of breath and diaphoresis that ended up resolving with 2 nitroglycerin tablets.  He took 4 baby aspirin in route with EMS and his symptoms are complete resolved.  He denies recent fevers, chills, congestion, cough, nausea, vomiting, constipation, diarrhea, or urinary changes.  Denies any history of DVT or PE and is taking his blood thinners as directed.  He denies any leg pain or leg swelling and denies any discomfort this time.  Denies any abdominal pain whatsoever, just a pressure-like aching pain across his chest.  Of note, patient does report he went to the gym for the first time in "a long time" yesterday and thinks this could be musculoskeletal pains.  However given his history he wants to rule out cardiac causes.  He denies any recent spicy foods and reports this is not feel like GERD.  He also says this  did not feel like his previous MI requiring stenting.  He reports the pain lasted total about 20 minutes and was like not exertional or pleuritic.  He could not reproduce his discomfort with palpation and denied any other complaints on arrival.  On exam,  lungs were clear.  Chest was nontender he has a loud murmur which she reports is chronic.  Abdomen nontender.  Back and flanks nontender.  Good pulses in all extremities.  Legs are nontender and nonedematous.  Vital signs reassuring on my evaluation without significant tachycardia, tachypnea, hypoxia, or elevated blood pressures.  Patient resting comfortably now without any acute distress.  EKG has some artifact so difficult to see if it is a sinus or A-fib rhythm but is not tachycardic and there is no STEMI seen.  Appears similar to prior overall.  With his cardiac history, we will get troponins and other labs and will also get chest x-ray.  As the pain was not sharp and do not go to the back and his blood pressure is normal, have low suspicion this is an acute aortic etiology.  As he denies any leg pain or leg swelling and it was not pleuritic or with no tachycardia, tachypnea, or hypoxia have low suspicion for a thromboembolic etiology of symptoms as he is on blood thinners.  Primarily suspect musculoskeletal pains after his recent gym use however, we will get work-up and then likely touch base with cardiology to discuss disposition.   5:34 PM Workup began to return. Initial troponin is normal.  Lipase normal.  CBC and CMP overall reassuring.  X-ray shows a small pneumothorax on the right side without any evidence of tension or mediastinal shift.  I spoke to pulmonary/critical care who looked at the images and agree this seems like a very small pneumothorax.  They feel he is appropriate to not do a chest tube and instead put a 100% NRB and admit to medicine.  They recommended repeat chest x-ray around midnight and then likely 1 in the morning as well.   They would be on standby to be consulted if things were to worsen and he would need a chest tube.  Patient agrees this plan and will call for admission.         Final Clinical Impression(s) / ED Diagnoses Final diagnoses:  Pneumothorax on right    Clinical Impression: 1. Pneumothorax on right     Disposition: Admit  This note was prepared with assistance of Dragon voice recognition software. Occasional wrong-word or sound-a-like substitutions may have occurred due to the inherent limitations of voice recognition software.     Carey Johndrow, Gwenyth Allegra, MD 09/24/22 [phone]

## 2022-09-24 NOTE — H&P (Signed)
History and Physical   CALDEN DORSEY RDE:[id number] DOB: 1946/08/07 DOA: 09/24/2022  PCP: Kathyrn Lass, MD   Patient coming from: Home  Chief Complaint: Chest pain  HPI: Jose Stone is a 76 y.o. male with medical history significant of hypertension, hyperlipidemia, GERD, atrial fibrillation, CAD, of ventricular septal due to, OSA, aortic regurgitation, anemia, obesity, gout, prostate cancer presenting with chest pain.  Patient reports that he was sitting in recliner relaxing earlier today when he had sudden onset of crushing chest pain.  Chest pain was nonradiating and was associate with some shortness of breath and diaphoresis.  Improved with nitroglycerin as well as aspirin.  EMS transported patient to the ED.  He denies fevers, chills, abdominal pain, constipation, diarrhea, nausea, vomiting. He reports chronic cough for the past several weeks.  Also reports that he went to the gym for the first time in a long time yesterday and thought some of his pain may be due to musculoskeletal.  ED Course: Vital signs in ED significant for heart rate in the 50s to 60s, blood pressure in the 631S to 970 systolic.  Lab workup included CMP with glucose 143, protein 6.3, albumin 3.4, ALT 57.  CBC within normal limits.  Troponin normal with repeat pending.  Lipase normal.  Chest x-ray showed small right-sided pneumothorax.  Pulmonology was consulted and stated patient would not need chest tube due to the size pneumothorax and would be okay on a nonrebreather with chest x-rays trending overnight.  They are not following and will be available if needed for further consultation.  Review of Systems: As per HPI otherwise all other systems reviewed and are negative.  Past Medical History:  Diagnosis Date   Aortic root enlargement (HCC)    Atrial fibrillation with RVR ([place]) 07/13/2018   CAD (coronary artery disease)    Complication of anesthesia    Hypotensive with general anesth.   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    Ventricular septal defect     Past Surgical History:  Procedure Laterality Date   ATRIAL FIBRILLATION ABLATION N/A 07/20/2020   Procedure: ATRIAL FIBRILLATION ABLATION;  Surgeon: [name], MD;  Location: [place] [facility];  Service: Cardiovascular;  Laterality: N/A;   CARDIAC CATHETERIZATION     CARDIOVERSION N/A 02/29/2020   Procedure: CARDIOVERSION;  Surgeon: [name], MD;  Location: [facility];  Service: Cardiovascular;  Laterality: N/A;   CARDIOVERSION N/A 06/28/2020   Procedure: CARDIOVERSION;  Surgeon: Thayer Headings, MD;  Location: [facility];  Service: Cardiovascular;  Laterality: N/A;   CORONARY STENT INTERVENTION N/A 03/18/2021   Procedure: CORONARY STENT INTERVENTION;  Surgeon: Burnell Blanks, MD;  Location: [place] [facility];  Service: Cardiovascular;  Laterality: N/A;   DISTAL INTERPHALANGEAL JOINT FUSION Left 01/19/2019   Procedure: HALLUX INTERPHANGEAL JOINT FUSION LEFT FOOT;  Surgeon: Trula Slade, DPM;  Location: [facility];  Service: Podiatry;  Laterality: Left;  LEFT BLOCK   hipospedious     LEFT HEART CATH AND CORONARY ANGIOGRAPHY N/A 03/18/2021   Procedure: LEFT HEART CATH AND CORONARY ANGIOGRAPHY;  Surgeon: Burnell Blanks, MD;  Location: [place] [facility];  Service: Cardiovascular;  Laterality: N/A;   LEFT HEART CATHETERIZATION WITH CORONARY ANGIOGRAM N/A 08/14/2014  Procedure: LEFT HEART CATHETERIZATION WITH CORONARY ANGIOGRAM;  Surgeon: Birdie Riddle, MD;  Location: [facility] CATH LAB;  Service: Cardiovascular;  Laterality: N/A;   MASS EXCISION Left 01/19/2019   Procedure: EXCISION BENIGN LESION 2.OCM LEFT FOOT;  Surgeon: Trula Slade, DPM;  Location: [place];  Service: Podiatry;  Laterality: Left;    SHOULDER ARTHROSCOPY WITH SUBACROMIAL DECOMPRESSION Left 05/03/2020   Procedure: SHOULDER ARTHROSCOPY DEBRIDEMENT PARTIAL ROTATOR CUFF REPAIR, SUBACROMIAL DECOMPRESSION, BICEPS TENOTOMY;  Surgeon: Tania Ade, MD;  Location: [facility];  Service: Orthopedics;  Laterality: Left;   [place]    Social History  reports that he has never smoked. He has never used smokeless tobacco. He reports that he does not drink alcohol and does not use drugs.  Allergies  Allergen Reactions   Ciprofloxacin Other (See Comments)    Bulging aorta   Codeine Nausea Only and Other (See Comments)    Can tolerate with benadryl   Diltiazem Cd [Diltiazem Hcl] Other (See Comments)    Bradycardia   Mupirocin Itching   Tramadol Hcl     Other reaction(s): rash   Bacitra-Neomycin-Polymyxin-Hc Rash   Hydrocodone Rash and Other (See Comments)    Can tolerate with benadryl   Neosporin [Bacitracin-Polymyxin B] Rash   Neosporin [Neomycin-Bacitracin Zn-Polymyx] Rash   Oxycodone Rash and Other (See Comments)    Can tolerate with benadryl    Family History  Problem Relation Age of Onset   Pancreatic cancer Mother    Aortic aneurysm Maternal Grandmother    Heart attack Maternal Grandfather    Diabetes Paternal Grandfather    CAD Paternal Grandfather   Reviewed on admission  Prior to Admission medications   Medication Sig Start Date End Date Taking? Authorizing Provider  acetaminophen (TYLENOL) 500 MG tablet Take 1,000 mg by mouth every 6 (six) hours as needed for mild pain or headache.    [provider]  albuterol (VENTOLIN HFA) 108 (90 Base) MCG/ACT inhaler 2 puffs as needed 11/01/21   [provider]  alendronate (FOSAMAX) 70 MG tablet Take 70 mg by mouth every Wednesday. Take with a full glass of water on an empty stomach.    [provider]  allopurinol (ZYLOPRIM) 100 MG tablet Take 200 mg by mouth daily.  03/26/18   [provider]   atorvastatin (LIPITOR) 80 MG tablet TAKE 1 TABLET BY MOUTH DAILY AT 6 PM 07/28/22   Hilty, Nadean Corwin, MD  COLCRYS 0.6 MG tablet Take 0.6 mg by mouth daily as needed (FOR GOUT).  05/26/14   [provider]  ferrous sulfate 325 (65 FE) MG tablet 1 tablet 03/26/21   [provider]  ketotifen (ZADITOR) 0.025 % ophthalmic solution Place 1 drop into both eyes as needed for allergies.    [provider]  Light Mineral Oil-Mineral Oil (RETAINE MGD) 0.5-0.5 % EMUL Place 1 drop into both eyes in the morning, at noon, and at bedtime.    [provider]  loratadine (CLARITIN) 10 MG tablet Take 10 mg by mouth daily as needed for allergies.    [provider]  Multiple Vitamin (MULTIVITAMIN WITH MINERALS) TABS tablet Take 1 tablet by mouth daily. Centrum Silver    [provider]  nitroGLYCERIN (NITROSTAT) 0.4 MG SL tablet PLACE 1 TAB UNDER TONGUE EVERY 5 MINS AS NEEDED FOR CHEST PAIN,MAX 3 DOSES 03/19/21   Hilty, Nadean Corwin, MD  Omega-3 Fat Ac-Cholecalciferol (DRY EYE OMEGA BENEFITS/VIT D-3 PO) Take 1  capsule by mouth 3 (three) times daily.    [provider]  pantoprazole (PROTONIX) 40 MG tablet TAKE 1 TABLET BY MOUTH EVERY DAY 03/17/22   Hilty, Nadean Corwin, MD  sildenafil (REVATIO) 20 MG tablet Take 1 tablet by mouth as needed for erectile dysfunction.    [provider]  testosterone cypionate (DEPOTESTOSTERONE CYPIONATE) 200 MG/ML injection Inject 200 mg into the muscle See admin instructions. Every other week 01/29/21   [provider]  valsartan (DIOVAN) 160 MG tablet TAKE 1 TABLET BY MOUTH EVERY DAY 05/22/22   Hilty, Nadean Corwin, MD  vitamin B-12 (CYANOCOBALAMIN) 500 MCG tablet 1 tablet 03/26/21   [provider]  XARELTO 20 MG TABS tablet TAKE 1 TABLET BY MOUTH DAILY WITH SUPPER 05/05/22   [name], MD    Physical Exam: Vitals:   09/24/22 1524 09/24/22 1530 09/24/22 1600 09/24/22 1748  BP:  (!) 124/56 122/74    Pulse:  65 (!) 57 60  Resp:  '17 20 20  '$ Temp: 98.3 F (36.8 C)     SpO2:  95% 94% 99%  Weight:      Height:        Physical Exam Constitutional:      General: He is not in acute distress.    Appearance: Normal appearance.  HENT:     Head: Normocephalic and atraumatic.     Mouth/Throat:     Mouth: Mucous membranes are moist.     Pharynx: Oropharynx is clear.  Eyes:     Extraocular Movements: Extraocular movements intact.     Pupils: Pupils are equal, round, and reactive to light.  Cardiovascular:     Rate and Rhythm: Normal rate and regular rhythm.     Pulses: Normal pulses.     Heart sounds: Murmur heard.  Pulmonary:     Effort: Pulmonary effort is normal. No respiratory distress.     Breath sounds: Normal breath sounds.  Abdominal:     General: Bowel sounds are normal. There is no distension.     Palpations: Abdomen is soft.     Tenderness: There is no abdominal tenderness.  Musculoskeletal:        General: No swelling or deformity.  Skin:    General: Skin is warm and dry.  Neurological:     General: No focal deficit present.     Mental Status: Mental status is at baseline.    Labs on Admission: I have personally reviewed following labs and imaging studies  CBC: Recent Labs  Lab 09/24/22 1526  WBC 7.9  NEUTROABS 4.8  HGB 15.1  HCT 45.1  MCV 89.1  PLT 147    Basic Metabolic Panel: Recent Labs  Lab 09/24/22 1526  NA 140  K 4.3  CL 103  CO2 24  GLUCOSE 143*  BUN 23  CREATININE 0.90  CALCIUM 9.4    GFR: Estimated Creatinine Clearance: 92.5 mL/min (by C-G formula based on SCr of 0.9 mg/dL).  Liver Function Tests: Recent Labs  Lab 09/24/22 1526  AST 41  ALT 57*  ALKPHOS 84  BILITOT 0.7  PROT 6.3*  ALBUMIN 3.4*    Urine analysis: No results found for: "COLORURINE", "APPEARANCEUR", "LABSPEC", "PHURINE", "GLUCOSEU", "HGBUR", "BILIRUBINUR", "KETONESUR", "PROTEINUR", "UROBILINOGEN", "NITRITE", "LEUKOCYTESUR"  Radiological Exams on  Admission: DG Chest 2 View  Result Date: 09/24/2022 CLINICAL DATA:  Chest pain EXAM: CHEST - 2 VIEW COMPARISON:  CT 03/03/2022 FINDINGS: Small volume RIGHT pneumothorax is noted. Pleural edge measures 20 mm from the RIGHT  apical chest wall. Potential sub pulmonic component of the pneumothorax on the RIGHT. No midline shift. Mild basilar atelectasis. IMPRESSION: Small volume RIGHT pneumothorax.  No mediastinal shift. Electronically Signed   By: Suzy Bouchard M.D.   On: 09/24/2022 16:57    EKG: Independently reviewed.  Atrial fibrillation with rate of 68 bpm.  Nonspecific intraventricular conduction delay with QRS 122.  Assessment/Plan Principal Problem:   Pneumothorax Active Problems:   Essential hypertension   Hyperlipidemia   GERD (gastroesophageal reflux disease)   VSD (ventricular septal defect)   Coronary artery disease involving native coronary artery of native heart without angina pectoris   PAF (paroxysmal atrial fibrillation) (HCC)   Gout   Obesity due to excess calories   Pneumothorax > Patient presenting with sudden onset chest pain that improved with nitroglycerin and aspirin earlier today. > Chest x-ray shows small volume right-sided pneumothorax. > Otherwise workup benign. > Did workout for the first time in a long time yesterday at the gym. > Pulmonology consulted and state due to small size of pneumothorax chest tube not needed at this time.  Recommended nonrebreather and trending chest x-rays overnight.  Not following but are available for reconsult if needed. - Monitor telemetry - Continue supplemental oxygen - Trend checks x-rays at midnight and in the morning - Continue to trend troponin, initial negative  Hyperlipidemia CAD - Continue home atorvastatin, valsartan - Is on Xarelto for A-fib  Atrial fibrillation > Does have history of AVR, DCCV, ablation - Continue home Xarelto  Hypertension - Continue home  losartan  GERD - Continue PPI  VSD with  known murmur - Noted  Gout - Continue home allopurinol  Obesity - Noted   Prostate cancer > Newly diagnosed with workup ongoing through [facility] - Noted  DVT prophylaxis: Xarelto Code Status:   Full Family Communication:  None on admission  Disposition Plan:   Patient is from:  Home  Anticipated DC to:  Home  Anticipated DC date:  1 to 2 days  Anticipated DC barriers: None  Consults called:  None (EDP spoke with pulmonology, but not formally consulting) Admission status:  Observation, telemetry  Severity of Illness: The appropriate patient status for this patient is OBSERVATION. Observation status is judged to be reasonable and necessary in order to provide the required intensity of service to ensure the patient's safety. The patient's presenting symptoms, physical exam findings, and initial radiographic and laboratory data in the context of their medical condition is felt to place them at decreased risk for further clinical deterioration. Furthermore, it is anticipated that the patient will be medically stable for discharge from the hospital within 2 midnights of admission.    Marcelyn Bruins MD [facility]  How to contact the [facility] Attending or Consulting provider [place] or covering provider during after hours [place], for this patient?   Check the care team in [facility] and look for a) attending/consulting [facility] provider listed and b) the [facility] team listed Log into [email or web address] and use [place]'s universal password to access. If you do not have the password, please contact the hospital operator. Locate the [facility] provider you are looking for under [facility] and page to a number that you can be directly reached. If you still have difficulty reaching the provider, please page the [facility] (Director on Call) for the Hospitalists listed on amion for assistance.  09/24/2022, 6:22 PM

## 2022-09-24 NOTE — ED Triage Notes (Signed)
The pt arrived by [facility] from home/  the pt has had  chest pain for one hour no sob  no nausea   no pain on arrival  he had taken  nitro x 2 and '324mg'$   before ems arribed.  A and o x4   hx of af cardiac  stents

## 2022-09-25 ENCOUNTER — Observation Stay (HOSPITAL_COMMUNITY): Payer: Medicare Other

## 2022-09-25 DIAGNOSIS — J9311 Primary spontaneous pneumothorax: Secondary | ICD-10-CM

## 2022-09-25 DIAGNOSIS — R059 Cough, unspecified: Secondary | ICD-10-CM | POA: Diagnosis not present

## 2022-09-25 DIAGNOSIS — J939 Pneumothorax, unspecified: Secondary | ICD-10-CM | POA: Diagnosis not present

## 2022-09-25 DIAGNOSIS — R079 Chest pain, unspecified: Secondary | ICD-10-CM | POA: Diagnosis not present

## 2022-09-25 LAB — COMPREHENSIVE METABOLIC PANEL
ALT: 56 U/L — ABNORMAL HIGH (ref 0–44)
AST: 37 U/L (ref 15–41)
Albumin: 3.2 g/dL — ABNORMAL LOW (ref 3.5–5.0)
Alkaline Phosphatase: 75 U/L (ref 38–126)
Anion gap: 7 (ref 5–15)
BUN: 21 mg/dL (ref 8–23)
CO2: 27 mmol/L (ref 22–32)
Calcium: 8.9 mg/dL (ref 8.9–10.3)
Chloride: 103 mmol/L (ref 98–111)
Creatinine, Ser: 0.94 mg/dL (ref 0.61–1.24)
GFR, Estimated: >60 mL/min (ref >60)
Glucose, Bld: 104 mg/dL — ABNORMAL HIGH (ref 70–99)
Potassium: 3.7 mmol/L (ref 3.5–5.1)
Sodium: 137 mmol/L (ref 135–145)
Total Bilirubin: 0.5 mg/dL (ref 0.3–1.2)
Total Protein: 5.8 g/dL — ABNORMAL LOW (ref 6.5–8.1)

## 2022-09-25 LAB — CBC
HCT: 42.1 % (ref 39.0–52.0)
Hemoglobin: 14.2 g/dL (ref 13.0–17.0)
MCH: 29.8 pg (ref 26.0–34.0)
MCHC: 33.7 g/dL (ref 30.0–36.0)
MCV: 88.4 fL (ref 80.0–100.0)
Platelets: 174 10*3/uL (ref 150–400)
RBC: 4.76 MIL/uL (ref 4.22–5.81)
RDW: 14.6 % (ref 11.5–15.5)
WBC: 7.7 10*3/uL (ref 4.0–10.5)
nRBC: 0 % (ref 0.0–0.2)

## 2022-09-25 MED ORDER — IRBESARTAN 150 MG PO TABS
150.0000 mg | ORAL_TABLET | Freq: Every day | ORAL | Status: DC
  Administered 2022-09-25: 150 mg via ORAL
  Filled 2022-09-25: qty 1

## 2022-09-25 MED ORDER — ACETAMINOPHEN 325 MG PO TABS: 650.0000 mg | ORAL_TABLET | Freq: Four times a day (QID) | ORAL | Status: DC | PRN

## 2022-09-25 NOTE — Progress Notes (Signed)
Patient ambulated approximately 923f in the hallway on room air and tolerated well.  Will continue to monitor.

## 2022-09-25 NOTE — Discharge Summary (Signed)
DISCHARGE SUMMARY  Jose Stone  MR#: [id number]  DOB:1946-09-04  Date of Admission: 09/24/2022 Date of Discharge: 09/25/2022  Attending Physician:Jose Markham Hennie Duos, MD  Patient's KNL:[id number], Jose Haw, MD  Consults: none  Disposition: D/C home   Follow-up Appts:  Follow-up Information     Jose Lass, MD Follow up.   Specialty: Family Medicine Why: As needed Contact information: [place] [address] [phone]                 Discharge Diagnoses: Spontaneous small right pneumothorax CAD Hyperlipidemia Chronic atrial fibrillation HTN VSD Gout Obesity - Body mass index is 31.18 kg/m. Prostate cancer  Initial presentation: 76 year old with a history of HTN, HLD, GERD, atrial fibrillation, CAD, OSA, aortic valve regurgitation, obesity, gout, and prostate cancer who presented to the ER with the sudden onset of nonradiating crushing chest pain associated with shortness of breath and diaphoresis.  In the ER a CXR noted a small right-sided pneumothorax.  Pulmonary evaluated the patient in the ER, and did not feel he needed a chest tube.   Hospital Course:  Spontaneous small right pneumothorax No clear inciting event, though patient did "workout" the morning of the occurrence, and had also been coughing frequently in the week prior due to a suspected viral URI  -spontaneously resolved on follow-up CXR 09/25/22 with no persisting pneumothorax evident -educated extensively on warning signs of recurrence and reasons to summon EMS/return to the ER -patient able to ambulate in the hallway without any difficulty.   CAD Asymptomatic   Hyperlipidemia Continue usual home medical therapy   Chronic atrial fibrillation With a history of DCCV and ablation -continue Xarelto -no changes in medical therapy during this hospital stay   HTN Blood pressure controlled   VSD Known chronic murmur   Gout Continue usual allopurinol dose   Obesity -  Body mass index is 31.18 kg/m.   Prostate cancer Recently diagnosed with ongoing work-up through [facility] as of 09/25/2022       Reactions   Ciprofloxacin Other (See Comments)   Bulging aorta   Codeine Nausea Only, Other (See Comments)   Can tolerate with benadryl   Diltiazem Cd [diltiazem Hcl] Other (See Comments)   Bradycardia   Mupirocin Itching   Tramadol Hcl    Other reaction(s): rash   Bacitra-neomycin-polymyxin-hc Rash   Hydrocodone Rash, Other (See Comments)   Can tolerate with benadryl   Neosporin [bacitracin-polymyxin B] Rash   Neosporin [neomycin-bacitracin Zn-polymyx] Rash   Oxycodone Rash, Other (See Comments)   Can tolerate with benadryl        Medication List     TAKE these medications    acetaminophen 500 MG tablet Commonly known as: TYLENOL Take 1,000 mg by mouth every 6 (six) hours as needed for mild pain or headache.   albuterol 108 (90 Base) MCG/ACT inhaler Commonly known as: VENTOLIN HFA Inhale 2 puffs into the lungs every 6 (six) hours as needed for wheezing or shortness of breath.   alendronate 70 MG tablet Commonly known as: FOSAMAX Take 70 mg by mouth every Wednesday. Take with a full glass of water on an empty stomach.   allopurinol 100 MG tablet Commonly known as: ZYLOPRIM Take 200 mg by mouth in the morning.   atorvastatin 80 MG tablet Commonly known as: LIPITOR TAKE 1 TABLET BY MOUTH DAILY AT 6 PM What changed: when to take this   Centrum Silver 50+Men Tabs Take 1 tablet by mouth daily with  breakfast.   Colcrys 0.6 MG tablet Generic drug: colchicine Take 0.6 mg by mouth daily as needed (FOR GOUT FLARES).   DRY EYE OMEGA BENEFITS/VIT D-3 PO Take 1 capsule by mouth 3 (three) times daily.   ferrous sulfate 325 (65 FE) MG tablet Take 325 mg by mouth every evening.   ketotifen 0.025 % ophthalmic solution Commonly known as: ZADITOR Place 1 drop into both eyes 2 (two) times daily as needed for allergies.    loratadine 10 MG tablet Commonly known as: CLARITIN Take 10 mg by mouth daily as needed for allergies.   nitroGLYCERIN 0.4 MG SL tablet Commonly known as: NITROSTAT PLACE 1 TAB UNDER TONGUE EVERY 5 MINS AS NEEDED FOR CHEST PAIN,MAX 3 DOSES What changed: See the new instructions.   pantoprazole 40 MG tablet Commonly known as: PROTONIX TAKE 1 TABLET BY MOUTH EVERY DAY What changed: when to take this   Retaine MGD 0.5-0.5 % Emul Generic drug: Light Mineral Oil-Mineral Oil Place 1 drop into both eyes in the morning, at noon, and at bedtime.   sildenafil 20 MG tablet Commonly known as: REVATIO Take 20 mg by mouth as needed for erectile dysfunction.   valsartan 160 MG tablet Commonly known as: DIOVAN TAKE 1 TABLET BY MOUTH EVERY DAY What changed: when to take this   vitamin B-12 500 MCG tablet Commonly known as: CYANOCOBALAMIN Take 500 mcg by mouth every evening.   Xarelto 20 MG Tabs tablet Generic drug: rivaroxaban TAKE 1 TABLET BY MOUTH DAILY WITH SUPPER What changed: how much to take        Day of Discharge BP 121/69 (BP Location: Right Arm)   Pulse (!) 55   Temp 97.8 F (36.6 C) (Oral)   Resp 15   Ht '6\' 1"'$  (1.854 m)   Wt 107.2 kg   SpO2 98%   BMI 31.18 kg/m   Physical Exam: General: No acute respiratory distress Lungs: Clear to auscultation bilaterally without wheezes or crackles Cardiovascular: Regular rate without murmur gallop or rub normal S1 and S2 Abdomen: Nontender, nondistended, soft, bowel sounds positive, no rebound, no ascites, no appreciable mass Extremities: No significant cyanosis, clubbing, or edema bilateral lower extremities  Basic Metabolic Panel: Recent Labs  Lab 09/24/22 1526 09/25/22 0044  NA 140 137  K 4.3 3.7  CL 103 103  CO2 24 27  GLUCOSE 143* 104*  BUN 23 21  CREATININE 0.90 0.94  CALCIUM 9.4 8.9   CBC: Recent Labs  Lab 09/24/22 1526 09/25/22 0044  WBC 7.9 7.7  NEUTROABS 4.8  --   HGB 15.1 14.2  HCT 45.1 42.1   MCV 89.1 88.4  PLT 197 174   Time spent in discharge (includes decision making & examination of pt): 30 minutes  09/25/2022, 1:34 PM   Cherene Altes, MD [facility] Office  [phone]

## 2022-09-25 NOTE — Plan of Care (Signed)
DISCHARGE NOTE HOME Jose Stone to be discharged home per MD order. Discussed follow up appointments with the patient. Medication list explained, patient verbalized understanding.  Skin clean, dry and intact without evidence of skin break down, no evidence of skin tears noted. IV catheter discontinued intact. Site without signs and symptoms of complications. Dressing and pressure applied. Pt denies pain at the site currently. No complaints noted.  Patient free of lines, drains, and wounds.   An After Visit Summary (AVS) was printed and given to the patient. Patient escorted via wheelchair, and discharged home via private auto.  Stephan Minister, RN

## 2022-09-25 NOTE — Discharge Instructions (Signed)
DO NOT USE YOUR CPAP until 10/02/2022 - You may resume routine nightly use of your CPAP on 09/3022, as we discussed.

## 2022-10-06 ENCOUNTER — Ambulatory Visit: Payer: Medicare Other | Admitting: *Deleted

## 2022-10-06 DIAGNOSIS — M216X2 Other acquired deformities of left foot: Secondary | ICD-10-CM

## 2022-10-06 NOTE — Progress Notes (Signed)
Patient presents today to pick up custom molded foot orthotics recommended by Dr. Loel Lofty.   Orthotics were dispensed and fit was satisfactory. Reviewed instructions for break-in and wear. Written instructions given to patient.  Patient will follow up as needed.   [facility] - order # [id number]

## 2022-10-08 DIAGNOSIS — C61 Malignant neoplasm of prostate: Secondary | ICD-10-CM | POA: Diagnosis not present

## 2022-10-10 DIAGNOSIS — Z7952 Long term (current) use of systemic steroids: Secondary | ICD-10-CM | POA: Diagnosis not present

## 2022-10-10 DIAGNOSIS — Z7902 Long term (current) use of antithrombotics/antiplatelets: Secondary | ICD-10-CM | POA: Diagnosis not present

## 2022-10-10 DIAGNOSIS — C61 Malignant neoplasm of prostate: Secondary | ICD-10-CM | POA: Insufficient documentation

## 2022-10-10 DIAGNOSIS — Z7901 Long term (current) use of anticoagulants: Secondary | ICD-10-CM | POA: Diagnosis not present

## 2022-10-10 DIAGNOSIS — Z79899 Other long term (current) drug therapy: Secondary | ICD-10-CM | POA: Diagnosis not present

## 2022-10-14 DIAGNOSIS — Z6829 Body mass index (BMI) 29.0-29.9, adult: Secondary | ICD-10-CM | POA: Diagnosis not present

## 2022-10-14 DIAGNOSIS — C61 Malignant neoplasm of prostate: Secondary | ICD-10-CM | POA: Diagnosis not present

## 2022-10-14 DIAGNOSIS — Z8709 Personal history of other diseases of the respiratory system: Secondary | ICD-10-CM | POA: Diagnosis not present

## 2022-10-15 DIAGNOSIS — N4231 Prostatic intraepithelial neoplasia: Secondary | ICD-10-CM | POA: Diagnosis not present

## 2022-10-15 DIAGNOSIS — C61 Malignant neoplasm of prostate: Secondary | ICD-10-CM | POA: Diagnosis not present

## 2022-10-20 ENCOUNTER — Ambulatory Visit (INDEPENDENT_AMBULATORY_CARE_PROVIDER_SITE_OTHER): Payer: Medicare Other

## 2022-10-20 ENCOUNTER — Ambulatory Visit (INDEPENDENT_AMBULATORY_CARE_PROVIDER_SITE_OTHER): Payer: Medicare Other | Admitting: Podiatry

## 2022-10-20 DIAGNOSIS — L03032 Cellulitis of left toe: Secondary | ICD-10-CM | POA: Diagnosis not present

## 2022-10-20 DIAGNOSIS — M9689 Other intraoperative and postprocedural complications and disorders of the musculoskeletal system: Secondary | ICD-10-CM | POA: Diagnosis not present

## 2022-10-20 DIAGNOSIS — L84 Corns and callosities: Secondary | ICD-10-CM

## 2022-10-20 DIAGNOSIS — L02612 Cutaneous abscess of left foot: Secondary | ICD-10-CM | POA: Diagnosis not present

## 2022-10-20 DIAGNOSIS — L97522 Non-pressure chronic ulcer of other part of left foot with fat layer exposed: Secondary | ICD-10-CM

## 2022-10-20 MED ORDER — AMOXICILLIN-POT CLAVULANATE 875-125 MG PO TABS: 1.0000 | ORAL_TABLET | Freq: Two times a day (BID) | ORAL | 0 refills | Status: DC

## 2022-10-20 NOTE — Progress Notes (Signed)
Subjective:  [name], male    DOB: 09/18/1946,  MRN: [id number]  Chief Complaint  Patient presents with   Toe Pain    Left GREAT TOE -BLOOD BLISTER infected and swollen    76 y.o. male presents with concern for infected blood blister on the left great toe.  He also has noticed some increased redness and swelling of the left great toe.  Says that there was redness in the foot yesterday but has improved slightly.  He has not been on any antibiotics yet.  He has previously had a preulcerative lesion at the plantar medial aspect of the left great toe which at last visit approximately 6 weeks ago was noted to be nearly completely healed and looking good with no sign of infection.  He reports he recently got a new pair of custom-made orthotics which could have contributed to increased callus formation and pressure on the area.  He also has a history of having attempted fusion across the interphalangeal joint of the left hallux with a screw placed but it did not fully heal he had to use a bone stimulator.  Past Medical History:  Diagnosis Date   Aortic root enlargement (HCC)    Atrial fibrillation with RVR ([place]) 07/13/2018   CAD (coronary artery disease)    Complication of anesthesia    Hypotensive with general anesth.   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    Ventricular septal defect     Allergies  Allergen Reactions   Ciprofloxacin Other (See Comments)    Bulging aorta   Codeine Nausea Only and Other (See Comments)    Can tolerate with benadryl   Diltiazem Cd [Diltiazem Hcl] Other (See Comments)    Bradycardia   Mupirocin Itching   Tramadol Hcl     Other reaction(s): rash   Bacitra-Neomycin-Polymyxin-Hc Rash   Hydrocodone Rash and Other (See Comments)     Can tolerate with benadryl   Neosporin [Bacitracin-Polymyxin B] Rash   Neosporin [Neomycin-Bacitracin Zn-Polymyx] Rash   Oxycodone Rash and Other (See Comments)    Can tolerate with benadryl    ROS: Negative except as per HPI above  Objective:  General: AAO x3, NAD  Dermatological: Attention directed to the left hallux predebridement there is noted to be a hyperkeratotic lesion with purple-black discoloration underlying consistent with blood blister/hematoma.  Upon debridement there is noted to be a shallow ulceration to the level of subcutaneous fat tissue with healthy granular tissue at the wound base and hyperkeratotic tissue surrounding.  There is no purulence no malodor no deep probing sinus tract.  There is mild erythema of the dorsal aspect of the interphalangeal joint and edema of the left hallux  Vascular:  Dorsalis Pedis artery and Posterior Tibial artery pedal pulses are 2/4 bilateral.  Capillary fill time < 3 sec to all digits.   Neruologic: Grossly intact via light touch bilateral. Protective threshold intact to all sites bilateral.   Musculoskeletal: No gross boney pedal deformities bilateral. No pain, crepitus, or limitation noted with foot and ankle range of motion bilateral. Muscular strength 5/5 in all groups tested bilateral.  Gait: Unassisted, Nonantalgic.     Radiographs:  Date: 10/20/2022 XR left foot weightbearing AP/Lateral/Oblique   Findings: Attention directed to  the left hallux there is noted to be a single surgical screw placed across the interphalangeal joint from distal proximal with the head completely buried in the distal phalanx.  There is nonunion of the interphalangeal joint.  There is osseous spur present at the medial aspect of the distal phalanx.  There is questionable sclerosis and very mild osteolysis at the medial aspect of the interphalangeal joint. Assessment:   1. Skin ulcer of great toe with fat layer exposed, left ([place])   2. Nonunion of  osteotomy site   3. Cellulitis and abscess of toe, left   4. Pre-ulcerative calluses      Plan:  Patient was evaluated and treated and all questions answered.  Ulcer plantar medial aspect of the left hallux to the level of subcutaneous fat tissue with mild superficial infection -We discussed the etiology and factors that are a part of the wound healing process.  We also discussed the risk of infection both soft tissue and osteomyelitis from open ulceration.  Discussed the risk of limb loss if this happens or worsens. -No obvious evidence of deep infection or osteomyelitis at this time however cannot rule out based on the chronicity of the ulceration in the clinical appearance of the left hallux.  Further diagnostic workup will also be challenging related to the surgical screw limiting usefulness of MRI.  The retained surgical screw and nonunion also complicates the diagnostic picture. -Debridement as below. -Dressed with Betadine, DSD. -Continue home dressing changes daily with Betadine and gauze -Continue off-loading with postoperative shoe. -Vascular testing deferred patient strong pedal pulses -HgbA1c: 5.8 -Last antibiotics: Augmentin 875-125 mg BID x 10 days E Rx sent to pharmacy for concern for superficial infection of this ulceration. -Imaging: x-ray reviewed, shows possible osteolysis at the medial aspect of the hallux IPJ however not definitive.  Will defer MRI imaging for now related to retained surgical hardware present across the IPJ. -If the wound fails to improve with local wound care and offloading we will need to consider ongoing investigation for osteomyelitis and removal of hardware present at the hallux IPJ.  Procedure: Excisional Debridement of Wound Rationale: Removal of non-viable soft tissue from the wound to promote healing.  Anesthesia: none Post-Debridement Wound Measurements: 2 cm x 1 cm x 0.4 cm  Type of Debridement: Sharp Excisional Tissue Removed: Non-viable  soft tissue Depth of Debridement: subcutaneous tissue. Technique: Sharp excisional debridement to bleeding, viable wound base.  Dressing: Dry, sterile, compression dressing. Disposition: Patient tolerated procedure well.   Return in about 10 days (around 10/30/2022) for Follow up Left hallux ulcer.            Everitt Amber, DPM [facility]

## 2022-10-20 NOTE — Addendum Note (Signed)
Addended by: Ames Coupe F on: 10/20/2022 12:22 PM   Modules accepted: Level of Service

## 2022-10-21 ENCOUNTER — Encounter (HOSPITAL_BASED_OUTPATIENT_CLINIC_OR_DEPARTMENT_OTHER): Payer: Self-pay | Admitting: Internal Medicine

## 2022-10-21 NOTE — Telephone Encounter (Signed)
Sent for review

## 2022-10-22 ENCOUNTER — Encounter: Payer: Self-pay | Admitting: Cardiovascular Disease

## 2022-10-22 ENCOUNTER — Ambulatory Visit: Payer: Medicare Other | Attending: Cardiovascular Disease | Admitting: Cardiovascular Disease

## 2022-10-22 VITALS — BP 122/64 | HR 66 | Ht 73.0 in | Wt 229.8 lb

## 2022-10-22 DIAGNOSIS — C61 Malignant neoplasm of prostate: Secondary | ICD-10-CM | POA: Diagnosis not present

## 2022-10-22 DIAGNOSIS — Q210 Ventricular septal defect: Secondary | ICD-10-CM

## 2022-10-22 DIAGNOSIS — I480 Paroxysmal atrial fibrillation: Secondary | ICD-10-CM

## 2022-10-22 DIAGNOSIS — I10 Essential (primary) hypertension: Secondary | ICD-10-CM

## 2022-10-22 DIAGNOSIS — Z8679 Personal history of other diseases of the circulatory system: Secondary | ICD-10-CM

## 2022-10-22 DIAGNOSIS — I2510 Atherosclerotic heart disease of native coronary artery without angina pectoris: Secondary | ICD-10-CM | POA: Diagnosis not present

## 2022-10-22 DIAGNOSIS — G4733 Obstructive sleep apnea (adult) (pediatric): Secondary | ICD-10-CM | POA: Diagnosis not present

## 2022-10-22 DIAGNOSIS — Z9889 Other specified postprocedural states: Secondary | ICD-10-CM | POA: Diagnosis not present

## 2022-10-22 NOTE — Patient Instructions (Signed)
Medication Instructions:  The current medical regimen is effective;  continue present plan and medications as directed. Please refer to the Current Medication list given to you today.  *If you need a refill on your cardiac medications before your next appointment, please call your pharmacy*   Lab Work: none If you have labs (blood work) drawn today and your tests are completely normal, you will receive your results only by: [place] (if you have MyChart) OR A paper copy in the mail If you have any lab test that is abnormal or we need to change your treatment, we will call you to review the results.   Testing/Procedures: none   Follow-Up: At [facility], you and your health needs are our priority.  As part of our continuing mission to provide you with exceptional heart care, we have created designated Provider Care Teams.  These Care Teams include your primary Cardiologist (physician) and Advanced Practice Providers (APPs -  Physician Assistants and Nurse Practitioners) who all work together to provide you with the care you need, when you need it.  We recommend signing up for the patient portal called "MyChart".  Sign up information is provided on this After Visit Summary.  MyChart is used to connect with patients for Virtual Visits (Telemedicine).  Patients are able to view lab/test results, encounter notes, upcoming appointments, etc.  Non-urgent messages can be sent to your provider as well.   To learn more about what you can do with MyChart, go to [email or web address].    Your next appointment:   6 month(s)  The format for your next appointment:   In Person  Provider:   DR Charlcie Cradle   Other Instructions

## 2022-10-22 NOTE — Progress Notes (Signed)
Cardiology Office Note    Date:  10/25/2022   ID:  Jose Stone 19-Feb-1946, MRN [id number]  PCP:  Kathyrn Lass, MD  Cardiologist:  Shelva Majestic, MD (sleep); Dr. [name]  2 year F/U sleep evaluation  History of Present Illness:  Jose Stone is a 76 y.o. male who presents to the office for 2 year follow-up evaluation.  Jose Stone is followed by Dr. [name] for his primary cardiology care.  He has a history of previous documentation of sleep apnea over 10 years ago and apparently was on CPAP therapy for some time.  In August 2017, he underwent a reassessment of his sleep apnea with a sleep study and apparently on this study he was found to have increased upper airway resistance (U ARS) with an AHI of 2.5/h overall; however, there was moderate sleep apnea with REM sleep with an AHI of 26.7/h.  There was mild oxygen desaturation to a nadir of 86% and he snored with moderate snoring volume.  He also had increased periodic limb movement of sleep.  Apparently, because of his low AHI, he was not approved to resume CPAP therapy.  He is followed by Dr. [name] and has a history of a small VSD for which she has undergone periodic echocardiographic assessment.  A CT scan showed an ectatic root measuring 3.8 cm.  He also had a history of atrial fibrillation with RVR with spontaneously converted and has a history of hypertension and dyslipidemia.  He was referred for repeat sleep study due to worsening snoring witnessed by his wife.  His sleep study was done on April 29, 2019 and during that evaluation he met split-night criteria.  Other diagnostic portion of the study, AHI was 7.7 and his RDI was 14.5/h.  CPAP was implemented and was titrated up to an optimal pressure at 12 cm of water.  He had loud snoring volume during the diagnostic portion of the study.  Oxygen saturation nadir was 90%.  Jose Stone was set up with CPAP on June 23, 2019 with [facility] as his DME company.  A download was  obtained in the office  from August 22, 2019 through September 20, 2019 which confirms 100% usage.  He is averaging 6 hours and 17 minutes of CPAP use per night.  He states he typically goes to bed around 11 PM and oftentimes wakes up between 7 or 7:30 AM.  At a 12 cm water pressure, AHI is 5.0.  There are some nights with no leak but some nights with mild leak.  He has recently been trying the new ResMed F 30i mask which he believes is very comfortable and his leak is improved.  He states that at the beginning of the night, he does not feel like he is getting enough pressure.  He feels improved since reinitiating CPAP therapy with resolution of prior snoring.  He denies any daytime sleepiness.  An Epworth Sleepiness Scale was calculated in the office today and endorsed at 6 as shown below:  Epworth Sleepiness Scale: Situation   Chance of Dozing/Sleeping (0 = never , 1 = slight chance , 2 = moderate chance , 3 = high chance )   sitting and reading 1   watching TV 2   sitting inactive in a public place 0   being a passenger in a motor vehicle for an hour or more 0   lying down in the afternoon 3   sitting and  talking to someone 0   sitting quietly after lunch (no alcohol) 0   while stopped for a few minutes in traffic as the driver 0   Total Score  6   He was unaware of any bruxism, he denies painful restless legs, he denies any hypnagogic hallucinations or cataplectic events.  At his September 23, 2019 compliance evaluation he met compliance but in the early portion of the night he did not feel that he was getting adequate pressure.  At that time I discontinued his ramp time which was set at 45 minutes and changed him from a set pressure to an auto mode with a range of 10 to potentially 20 cm of water.  He was tolerating the new ResMed F 30i mask.  I last saw him on October 18, 2020.  Over the prior year he had undergone rotator cuff surgery on his left shoulder and also had  atrial  fibrillation/atrial flutter ablation in September 2021 by Dr. Rayann Heman.  He has continued to use CPAP with excellent compliance.  A new download from November 15 through October 16, 2020 shows 100% use with average use at 7 hours and 33 minutes.  His 95th percentile pressure is 12 with a maximum average pressure of 13.2.  AHI is excellent at 1.6.  He does not have any mask leak.  He is unaware of breakthrough snoring, bruxism, restless legs.  He denies daytime sleepiness.   Since I last saw him, he had undergone stenting to his left circumflex coronary artery in May 2022.  He also has had issues with an infected left great toe currently on Augmentin.  He has had difficulty walking as result of his left toe infection.  He also was diagnosed with prostate CA and is to start radiation therapy at [facility].  Around Thanksgiving, he had developed a pneumothorax with partial collapse of his lung and had significant coughing and allergies.  He required supplemental O2.  As result, recent compliance with CPAP was suboptimal until December 6 when usage became more frequent.  His most recent download from November 23 to October 21, 2022 shows average use at 6 hours and 27 minutes usage was only 12 of 30 days.  His pressure is set at a 10 to 20 cm range and AHI is 4.9 with his 95th percentile pressure at 14.8 and maximum average pressure at 16.3.  He presents for evaluation.   Past Medical History:  Diagnosis Date   Aortic root enlargement (HCC)    Atrial fibrillation with RVR ([place]) 07/13/2018   CAD (coronary artery disease)    Complication of anesthesia    Hypotensive with general anesth.   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    Ventricular septal defect     Past Surgical History:  Procedure  Laterality Date   ATRIAL FIBRILLATION ABLATION N/A 07/20/2020   Procedure: ATRIAL FIBRILLATION ABLATION;  Surgeon: [name], MD;  Location: [facility];  Service: Cardiovascular;  Laterality: N/A;   CARDIAC CATHETERIZATION     CARDIOVERSION N/A 02/29/2020   Procedure: CARDIOVERSION;  Surgeon: [name], MD;  Location: [facility];  Service: Cardiovascular;  Laterality: N/A;   CARDIOVERSION N/A 06/28/2020   Procedure: CARDIOVERSION;  Surgeon: Thayer Headings, MD;  Location: [facility];  Service: Cardiovascular;  Laterality: N/A;   CORONARY STENT INTERVENTION N/A 03/18/2021   Procedure: CORONARY STENT INTERVENTION;  Surgeon: Burnell Blanks, MD;  Location: [facility];  Service: Cardiovascular;  Laterality: N/A;   DISTAL INTERPHALANGEAL JOINT FUSION Left 01/19/2019   Procedure: HALLUX INTERPHANGEAL JOINT FUSION LEFT FOOT;  Surgeon: Trula Slade, DPM;  Location: [facility];  Service: Podiatry;  Laterality: Left;  LEFT BLOCK   hipospedious     LEFT HEART CATH AND CORONARY ANGIOGRAPHY N/A 03/18/2021   Procedure: LEFT HEART CATH AND CORONARY ANGIOGRAPHY;  Surgeon: Burnell Blanks, MD;  Location: [facility];  Service: Cardiovascular;  Laterality: N/A;   LEFT HEART CATHETERIZATION WITH CORONARY ANGIOGRAM N/A 08/14/2014   Procedure: LEFT HEART CATHETERIZATION WITH CORONARY ANGIOGRAM;  Surgeon: Birdie Riddle, MD;  Location: [facility];  Service: Cardiovascular;  Laterality: N/A;   MASS EXCISION Left 01/19/2019   Procedure: EXCISION BENIGN LESION 2.OCM LEFT FOOT;  Surgeon: Trula Slade, DPM;  Location: [facility];  Service: Podiatry;  Laterality: Left;   SHOULDER ARTHROSCOPY WITH SUBACROMIAL DECOMPRESSION Left 05/03/2020   Procedure: SHOULDER ARTHROSCOPY DEBRIDEMENT PARTIAL ROTATOR CUFF REPAIR, SUBACROMIAL DECOMPRESSION, BICEPS TENOTOMY;  Surgeon: Tania Ade, MD;  Location: [facility];  Service: Orthopedics;  Laterality: Left;   SHOULDER SURGERY      TONSILLECTOMY  1955    Current Medications: Outpatient Medications Prior to Visit  Medication Sig Dispense Refill   acetaminophen (TYLENOL) 500 MG tablet Take 1,000 mg by mouth every 6 (six) hours as needed for mild pain or headache.     albuterol (VENTOLIN HFA) 108 (90 Base) MCG/ACT inhaler Inhale 2 puffs into the lungs every 6 (six) hours as needed for wheezing or shortness of breath.     alendronate (FOSAMAX) 70 MG tablet Take 70 mg by mouth every Wednesday. Take with a full glass of water on an empty stomach.     allopurinol (ZYLOPRIM) 100 MG tablet Take 200 mg by mouth in the morning.  0   amoxicillin-clavulanate (AUGMENTIN) 875-125 MG tablet Take 1 tablet by mouth 2 (two) times daily. 20 tablet 0   atorvastatin (LIPITOR) 80 MG tablet TAKE 1 TABLET BY MOUTH DAILY AT 6 PM (Patient taking differently: Take 80 mg by mouth every evening.) 90 tablet 3   COLCRYS 0.6 MG tablet Take 0.6 mg by mouth daily as needed (FOR GOUT FLARES).     ferrous sulfate 325 (65 FE) MG tablet Take 325 mg by mouth every evening.     ketotifen (ZADITOR) 0.025 % ophthalmic solution Place 1 drop into both eyes 2 (two) times daily as needed for allergies.     Light Mineral Oil-Mineral Oil (RETAINE MGD) 0.5-0.5 % EMUL Place 1 drop into both eyes in the morning, at noon, and at bedtime.     loratadine (CLARITIN) 10 MG tablet Take 10 mg by mouth daily as needed for allergies.     Multiple Vitamins-Minerals (CENTRUM SILVER 50+MEN) TABS Take 1 tablet by mouth daily with breakfast.     nitroGLYCERIN (NITROSTAT) 0.4 MG SL tablet PLACE 1 TAB UNDER TONGUE EVERY 5 MINS AS NEEDED FOR CHEST PAIN,MAX 3 DOSES (Patient taking differently: Place 0.4 mg under the tongue every 5 (five) minutes x 3 doses as needed for chest pain.) 25 tablet 5   Omega-3 Fat Ac-Cholecalciferol (DRY EYE OMEGA BENEFITS/VIT D-3 PO) Take 1 capsule by mouth 3 (three) times daily.     pantoprazole (PROTONIX) 40 MG tablet TAKE 1  TABLET BY MOUTH EVERY DAY (Patient  taking differently: Take 40 mg by mouth daily before breakfast.) 90 tablet 4   sildenafil (REVATIO) 20 MG tablet Take 20 mg by mouth as needed for erectile dysfunction.     valsartan (DIOVAN) 160 MG tablet TAKE 1 TABLET BY MOUTH EVERY DAY (Patient taking differently: Take 160 mg by mouth in the morning.) 90 tablet 2   vitamin B-12 (CYANOCOBALAMIN) 500 MCG tablet Take 500 mcg by mouth every evening.     XARELTO 20 MG TABS tablet TAKE 1 TABLET BY MOUTH DAILY WITH SUPPER (Patient taking differently: Take 20 mg by mouth daily with supper.) 90 tablet 1   No facility-administered medications prior to visit.     Allergies:   Ciprofloxacin, Codeine, Diltiazem cd [diltiazem hcl], Mupirocin, Tramadol hcl, Bacitra-neomycin-polymyxin-hc, Hydrocodone, Neosporin [bacitracin-polymyxin b], Neosporin [neomycin-bacitracin zn-polymyx], and Oxycodone   Social History   Socioeconomic History   Marital status: Married    Spouse name: Not on file   Number of children: 2   Years of education: master's   Highest education level: Not on file  Occupational History   Occupation: Civil engineer, contracting    Comment: retired  Tobacco Use   Smoking status: Never   Smokeless tobacco: Never  Scientific laboratory technician Use: Never used  Substance and Sexual Activity   Alcohol use: No   Drug use: No   Sexual activity: Yes    Birth control/protection: None  Other Topics Concern   Not on file  Social History Narrative   Lives in [place] with spouse.   Retired Civil engineer, contracting   Social Determinants of Radio broadcast assistant Strain: Not on [employer] Insecurity: Not on file  Transportation Needs: Not on file  Physical Activity: Not on file  Stress: Not on file  Social Connections: Not on file    Socially he is married for 50 years.  He is a retired Civil engineer, contracting and retired at age 74.  He has 2 children ages 14 and 25 and grandchildren.  Family History:  The patient's family history includes Aortic aneurysm in his  maternal grandmother; CAD in his paternal grandfather; Diabetes in his paternal grandfather; Heart attack in his maternal grandfather; Pancreatic cancer in his mother.   His father died at age 52 with "old age. "His mother died at 75 and had pancreatic cancer.  He has 2 sisters.  ROS General: Negative; No fevers, chills, or night sweats;  HEENT: Negative; No changes in vision or hearing, sinus congestion, difficulty swallowing Pulmonary: Negative; No cough, wheezing, shortness of breath, hemoptysis Cardiovascular: History of small perimembranous VSD; PAF; hypertension, hyperlipidemia; status post AF/flutter ablation GI: Negative; No nausea, vomiting, diarrhea, or abdominal pain GU: Negative; No dysuria, hematuria, or difficulty voiding Musculoskeletal: Negative; no myalgias, joint pain, or weakness Hematologic/Oncology: Negative; no easy bruising, bleeding Endocrine: Negative; no heat/cold intolerance; no diabetes Neuro: Negative; no changes in balance, headaches Skin: Negative; No rashes or skin lesions Psychiatric: Negative; No behavioral problems, depression Sleep: See HPI Other comprehensive 14 point system review is negative.   PHYSICAL EXAM:   VS:  BP 122/64   Pulse 66   Ht _0  (1.854 m)   Wt 229 lb 12.8 oz (104.2 kg)   SpO2 94%   BMI 30.32 kg/m     Repeat blood pressure by me was 130/70.  Wt Readings from Last 3 Encounters:  10/22/22 229 lb 12.8 oz (104.2 kg)  09/24/22 236 lb 5.3 oz (107.2 kg)  04/22/22 236 lb 6.4 oz (107.2 kg)    General: Alert, oriented, no distress.  Skin: normal turgor, no rashes, warm and dry HEENT: Normocephalic, atraumatic. Pupils equal round and reactive to light; sclera anicteric; extraocular muscles intact;  Nose without nasal septal hypertrophy Mouth/Parynx benign; Mallinpatti scale 4 Neck: No JVD, no carotid bruits; normal carotid upstroke Lungs: clear to ausculatation and percussion; no wheezing or rales Chest wall: without tenderness  to palpitation Heart: PMI not displaced, RRR, s1 s2 normal, 2/6 systolic murmur secondary to VSD, no diastolic murmur, no rubs, gallops, thrills, or heaves Abdomen: soft, nontender; no hepatosplenomehaly, BS+; abdominal aorta nontender and not dilated by palpation. Back: no CVA tenderness Pulses 2+ Musculoskeletal: full range of motion, normal strength, no joint deformities Extremities: no clubbing cyanosis or edema, Homan's sign negative  Neurologic: grossly nonfocal; Cranial nerves grossly wnl Psychologic: Normal mood and affect    Studies/Labs Reviewed:   October 18, 2020  ECG (independently read by me): Sinus bradycardia at 52 bpm, PAC, incomplete right bundle branch block  September 23, 2019 ECG (independently read by me): Normal sinus rhythm with PAC mild RV conduction delay.  Normal intervals  Recent Labs:    Latest Ref Rng & Units 09/25/2022   12:44 AM 09/24/2022    3:26 PM 02/24/2022    9:25 AM  BMP  Glucose 70 - 99 mg/dL 104  143  92   BUN 8 - 23 mg/dL _0 Creatinine 0.61 - 1.24 mg/dL 0.94  0.90  0.91   BUN/Creat Ratio 10 - 24   21   Sodium 135 - 145 mmol/L 137  140  141   Potassium 3.5 - 5.1 mmol/L 3.7  4.3  4.6   Chloride 98 - 111 mmol/L 103  103  104   CO2 22 - 32 mmol/L _1 Calcium 8.9 - 10.3 mg/dL 8.9  9.4  9.4         Latest Ref Rng & Units 09/25/2022   12:44 AM 09/24/2022    3:26 PM 03/17/2021   12:10 AM  Hepatic Function  Total Protein 6.5 - 8.1 g/dL 5.8  6.3  5.8   Albumin 3.5 - 5.0 g/dL 3.2  3.4  3.4   AST 15 - 41 U/L 37  41  24   ALT 0 - 44 U/L 56  57  23   Alk Phosphatase 38 - 126 U/L 75  84  66   Total Bilirubin 0.3 - 1.2 mg/dL 0.5  0.7  0.8        Latest Ref Rng & Units 09/25/2022   12:44 AM 09/24/2022    3:26 PM 03/19/2021    2:11 AM  CBC  WBC 4.0 - 10.5 K/uL 7.7  7.9  8.2   Hemoglobin 13.0 - 17.0 g/dL 14.2  15.1  11.4   Hematocrit 39.0 - 52.0 % 42.1  45.1  37.3   Platelets 150 - 400 K/uL 174  197  219    Lab Results   Component Value Date   MCV 88.4 09/25/2022   MCV 89.1 09/24/2022   MCV 77.1 (L) 03/19/2021   Lab Results  Component Value Date   TSH 5.176 (H) 03/17/2021   Lab Results  Component Value Date   HGBA1C 5.8 (H) 03/17/2021     BNP No results found for: "BNP"  ProBNP    Component Value Date/Time   PROBNP 193.9 (H) 08/12/2014 1614  Lipid Panel     Component Value Date/Time   CHOL 104 03/18/2021 0605   CHOL 139 09/20/2019 0933   CHOL 131 01/22/2015 0812   TRIG 50 03/18/2021 0605   TRIG 93 01/22/2015 0812   HDL 37 (L) 03/18/2021 0605   HDL 52 09/20/2019 0933   HDL 49 01/22/2015 0812   CHOLHDL 2.8 03/18/2021 0605   VLDL 10 03/18/2021 0605   LDLCALC 57 03/18/2021 0605   LDLCALC 74 09/20/2019 0933   LDLCALC 63 01/22/2015 0812   LABVLDL 13 09/20/2019 0933     RADIOLOGY: No results found.    Additional studies/ records that were reviewed today include:  I reviewed the records of Dr. [name], the patient's 2017 sleep study, 2020 sleep study, and obtained a download from October 19 through September 20, 2019.  New download was obtained from September 17, 2020 through October 16, 2020. Records of Dr. Rayann Heman were reviewed  New download was obtained from November 20 through October 21, 2022.  ASSESSMENT:    1. OSA (obstructive sleep apnea)   2. PAF (paroxysmal atrial fibrillation) ([place])   3. S/P ablation of atrial fibrillation   4. Essential hypertension   5. VSD (ventricular septal defect)   6. 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 [name] and has a history of a small perimembranous VSD, hypertension, CAD, PAF on anticoagulation therapy, hyperlipidemia, and mild ectatic dilated aortic root.  Remotely he was diagnosed with sleep apnea over 11 years ago and for period of time was on CPAP therapy.  On his sleep study in 2017 he was found to have moderate sleep apnea during REM sleep but overall his  AHI was only 2.5/h and he was not approved to reinstitute CPAP therapy.  A repeat evaluation in 2020 again confirmed obstructive sleep apnea.  CPAP therapy was resumed in and when seen for initial compliance evaluation in November 2020 he was doing well.  However, his ramp time was prolonged and he was not feeling that he was getting adequate air in the early portion of the night.  As result I discontinued his ramp time.  I changed him to an auto mode.  His download from September 17, 2020 through October 16, 2020 showed excellent compliance.  With his pressure range from 10 up to 20 cm, AHI is excellent at 1.6.  His 95th percentile pressure is 12.0 and maximum average pressure 13.2.  He denies any residual daytime sleepiness or breakthrough snoring.  He is sleeping well.Marland Kitchen  He underwent successful rotator cuff surgery of his left shoulder and also underwent atrial fibrillation/flutter ablation in September by Dr. Rayann Heman.  He has a documented VSD and also has left-sided varicose veins.  Blood pressure today is stable.  He has a 2/6 systolic murmur secondary to his VSD.  Around Thanksgiving he developed a pneumothorax as result of excessive coughing with partial collapse of his lung and allergy issues.  As result he did not use CPAP for over a week.  Over the past week, he has been using CPAP on a daily basis.  His current pressure is set at a range of 10 to 20 cm of water and AHI is 4.9 with his 95th percentile pressure at 14.8 with maximum average pressure at 16.3.  I discussed optimal sleep duration of at least 7 hours per night if at all possible.  He continues to be on atorvastatin 80 mg  for hyperlipidemia.  He is on valsartan 160 daily for hypertension.  He currently has an infected left great toe on Augmentin.  He will be undergoing radiation treatment at [facility] for prostate CA.  I will see him in 6 months for reevaluation or sooner as needed.    Medication Adjustments/Labs and Tests Ordered: Current  medicines are reviewed at length with the patient today.  Concerns regarding medicines are outlined above.  Medication changes, Labs and Tests ordered today are listed in the Patient Instructions below. Patient Instructions  Medication Instructions:  The current medical regimen is effective;  continue present plan and medications as directed. Please refer to the Current Medication list given to you today.  *If you need a refill on your cardiac medications before your next appointment, please call your pharmacy*   Lab Work: none If you have labs (blood work) drawn today and your tests are completely normal, you will receive your results only by: [place] (if you have MyChart) OR A paper copy in the mail If you have any lab test that is abnormal or we need to change your treatment, we will call you to review the results.   Testing/Procedures: none   Follow-Up: At [facility], you and your health needs are our priority.  As part of our continuing mission to provide you with exceptional heart care, we have created designated Provider Care Teams.  These Care Teams include your primary Cardiologist (physician) and Advanced Practice Providers (APPs -  Physician Assistants and Nurse Practitioners) who all work together to provide you with the care you need, when you need it.  We recommend signing up for the patient portal called "MyChart".  Sign up information is provided on this After Visit Summary.  MyChart is used to connect with patients for Virtual Visits (Telemedicine).  Patients are able to view lab/test results, encounter notes, upcoming appointments, etc.  Non-urgent messages can be sent to your provider as well.   To learn more about what you can do with MyChart, go to [email or web address].    Your next appointment:   6 month(s)  The format for your next appointment:   In Person  Provider:   DR Charlcie Cradle   Other Instructions      Signed, Shelva Majestic,  MD,FACC, ABSM Diplomate, American Board of Sleep Medicine  10/25/2022 3:50 PM    [place] [address] [place], [place], [place]  [address] Phone: [phone]

## 2022-10-23 ENCOUNTER — Telehealth: Payer: Self-pay | Admitting: Internal Medicine

## 2022-10-23 ENCOUNTER — Encounter: Payer: Self-pay | Admitting: Cardiovascular Disease

## 2022-10-23 NOTE — Telephone Encounter (Signed)
Preoperative team, please contact [name] and have him contact the requesting surgeon/office so that they may send [place] a formal preoperative clearance request form.  Once we know the details surrounding his procedure/surgery we will be able to provide recommendations from a cardiac standpoint.  Thank you for your help.  Jose Stone. Jose Malson NP-C     10/23/2022, 2:01 PM [address] Office [phone] Fax [phone]

## 2022-10-23 NOTE — Telephone Encounter (Signed)
Spoke with pt and advised him to contact his surgeon's office and have them send [place] over a Surgical Clearance form over so we can take care of it.  Pt verbalized understanding.

## 2022-10-23 NOTE — Telephone Encounter (Signed)
See mychart message 10/21/22. Patient calling in regards to his mychart message requesting to speak with someone about his clearance. He states he was told he would need a clearance request sent to the office, but then received an answer from Dr. [name]. He would like to know why he can't just use that advise from Dr. [name].

## 2022-10-25 ENCOUNTER — Encounter: Payer: Self-pay | Admitting: Cardiovascular Disease

## 2022-10-30 ENCOUNTER — Ambulatory Visit (INDEPENDENT_AMBULATORY_CARE_PROVIDER_SITE_OTHER): Payer: Medicare Other | Admitting: Podiatry

## 2022-10-30 ENCOUNTER — Ambulatory Visit: Payer: Medicare Other | Admitting: Podiatry

## 2022-10-30 DIAGNOSIS — L02612 Cutaneous abscess of left foot: Secondary | ICD-10-CM

## 2022-10-30 DIAGNOSIS — M9689 Other intraoperative and postprocedural complications and disorders of the musculoskeletal system: Secondary | ICD-10-CM

## 2022-10-30 DIAGNOSIS — L97522 Non-pressure chronic ulcer of other part of left foot with fat layer exposed: Secondary | ICD-10-CM | POA: Diagnosis not present

## 2022-10-30 DIAGNOSIS — L03032 Cellulitis of left toe: Secondary | ICD-10-CM

## 2022-10-30 NOTE — Progress Notes (Signed)
Subjective:  [name], male    DOB: 09/24/1946,  MRN: [id number]  Chief Complaint  Patient presents with   Foot Problem    LEFT GREAT TOE -BLOOD BLISTER FROM A CALLOUS    76 y.o. male presents for follow-up on left hallux ulcer.  He says it is doing much better since last visit.  He is finishing his course of antibiotics tomorrow.  Denies any nausea vomiting fever chills.  Says drainage is decreased.  Has been putting Betadine and a Band-Aid on the area.  Redness and swelling have decreased in the left great toe.  Past Medical History:  Diagnosis Date   Aortic root enlargement (HCC)    Atrial fibrillation with RVR ([place]) 07/13/2018   CAD (coronary artery disease)    Complication of anesthesia    Hypotensive with general anesth.   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    Ventricular septal defect     Allergies  Allergen Reactions   Ciprofloxacin Other (See Comments)    Bulging aorta   Codeine Nausea Only and Other (See Comments)    Can tolerate with benadryl   Diltiazem Cd [Diltiazem Hcl] Other (See Comments)    Bradycardia   Mupirocin Itching   Tramadol Hcl     Other reaction(s): rash   Bacitra-Neomycin-Polymyxin-Hc Rash   Hydrocodone Rash and Other (See Comments)    Can tolerate with benadryl   Neosporin [Bacitracin-Polymyxin B] Rash   Neosporin [Neomycin-Bacitracin Zn-Polymyx] Rash   Oxycodone Rash and Other (See Comments)    Can tolerate with benadryl    ROS: Negative except as per HPI above  Objective:  General: AAO x3, NAD  Dermatological: Attention directed to the left hallux predebridement there is noted to be a hyperkeratotic lesion with healthy skin at site of prior ulceration.  There is still an area of eschar at the central  aspect with this very superficial underlying ulceration to subcutaneous fat.  Decreased erythema and edema of the left hallux.    Vascular:  Dorsalis Pedis artery and Posterior Tibial artery pedal pulses are 2/4 bilateral.  Capillary fill time < 3 sec to all digits.   Neruologic: Grossly intact via light touch bilateral. Protective threshold intact to all sites bilateral.   Musculoskeletal: No gross boney pedal deformities bilateral. No pain, crepitus, or limitation noted with foot and ankle range of motion bilateral. Muscular strength 5/5 in all groups tested bilateral.  Gait: Unassisted, Nonantalgic.    Radiographs:  Date: 10/20/2022 XR left foot weightbearing AP/Lateral/Oblique   Findings: Attention directed to the left hallux there is noted to be a single surgical screw placed across the interphalangeal joint from distal proximal with the head completely buried in the distal phalanx.  There is nonunion of the interphalangeal joint.  There is osseous spur present at the medial aspect of the distal phalanx.  There is questionable sclerosis and very mild osteolysis at the medial aspect of the interphalangeal joint. Assessment:   1. Skin ulcer of great toe with fat layer exposed, left ([place])   2. Nonunion of osteotomy site   3. Cellulitis and abscess of toe, left       Plan:  Patient was evaluated and treated and all questions answered.  Ulcer plantar medial  aspect of the left hallux to the level of subcutaneous fat tissue, improved after debridement and antibiotics -We discussed the etiology and factors that are a part of the wound healing process.  We also discussed the risk of infection both soft tissue and osteomyelitis from open ulceration.  Discussed the risk of limb loss if this happens or worsens. -Will defer on further imaging at this time as the wound has improved healing very nicely since last visit. -Debridement as below. -Dressed with Betadine, DSD. -Continue home  dressing changes daily with Betadine and gauze -Continue off-loading with postoperative shoe. -Vascular testing deferred patient strong pedal pulses -HgbA1c: 5.8 -Last antibiotics: Status post Augmentin x 10 days will defer further antibiotics as no localized signs of infection at this time -Imaging: No imaging taken at this visit -Will continue with local wound care at this time.  Procedure: Excisional Debridement of Wound Rationale: Removal of non-viable soft tissue from the wound to promote healing.  Anesthesia: none Post-Debridement Wound Measurements: 0.8 cm x 0.5 cm x 0.2 cm  Type of Debridement: Sharp Excisional Tissue Removed: Non-viable soft tissue Depth of Debridement: subcutaneous tissue. Technique: Sharp excisional debridement to bleeding, viable wound base.  Dressing: Dry, sterile, compression dressing. Disposition: Patient tolerated procedure well.   Return in about 3 weeks (around 11/20/2022) for F/u L hallux ulcer.            Everitt Amber, DPM [facility]

## 2022-11-12 DIAGNOSIS — K219 Gastro-esophageal reflux disease without esophagitis: Secondary | ICD-10-CM | POA: Diagnosis not present

## 2022-11-12 DIAGNOSIS — I10 Essential (primary) hypertension: Secondary | ICD-10-CM | POA: Diagnosis not present

## 2022-11-12 DIAGNOSIS — I2510 Atherosclerotic heart disease of native coronary artery without angina pectoris: Secondary | ICD-10-CM | POA: Diagnosis not present

## 2022-11-12 DIAGNOSIS — I4891 Unspecified atrial fibrillation: Secondary | ICD-10-CM | POA: Diagnosis not present

## 2022-11-12 DIAGNOSIS — E785 Hyperlipidemia, unspecified: Secondary | ICD-10-CM | POA: Diagnosis not present

## 2022-11-13 DIAGNOSIS — C61 Malignant neoplasm of prostate: Secondary | ICD-10-CM | POA: Diagnosis not present

## 2022-11-17 ENCOUNTER — Other Ambulatory Visit: Payer: Self-pay | Admitting: Internal Medicine

## 2022-11-18 NOTE — Telephone Encounter (Signed)
Prescription refill request for Xarelto received.  Indication:afib Last office visit:12/23 Weight:104.2  kg Age:77 Scr:0.9 CrCl:106.13  ml/min  Prescription refilled

## 2022-11-20 ENCOUNTER — Ambulatory Visit (INDEPENDENT_AMBULATORY_CARE_PROVIDER_SITE_OTHER): Payer: Medicare Other | Admitting: Podiatry

## 2022-11-20 DIAGNOSIS — M216X2 Other acquired deformities of left foot: Secondary | ICD-10-CM | POA: Diagnosis not present

## 2022-11-20 DIAGNOSIS — L84 Corns and callosities: Secondary | ICD-10-CM

## 2022-11-20 DIAGNOSIS — L97522 Non-pressure chronic ulcer of other part of left foot with fat layer exposed: Secondary | ICD-10-CM

## 2022-11-20 DIAGNOSIS — L02612 Cutaneous abscess of left foot: Secondary | ICD-10-CM

## 2022-11-20 DIAGNOSIS — D689 Coagulation defect, unspecified: Secondary | ICD-10-CM

## 2022-11-20 DIAGNOSIS — L03032 Cellulitis of left toe: Secondary | ICD-10-CM | POA: Diagnosis not present

## 2022-11-20 DIAGNOSIS — M7752 Other enthesopathy of left foot: Secondary | ICD-10-CM | POA: Diagnosis not present

## 2022-11-20 DIAGNOSIS — M9689 Other intraoperative and postprocedural complications and disorders of the musculoskeletal system: Secondary | ICD-10-CM

## 2022-11-20 MED ORDER — AMOXICILLIN-POT CLAVULANATE 875-125 MG PO TABS: 1.0000 | ORAL_TABLET | Freq: Two times a day (BID) | ORAL | 0 refills | Status: DC

## 2022-11-20 NOTE — Progress Notes (Signed)
Subjective:  [name], male    DOB: [birth year],  MRN: [id number]  Chief Complaint  Patient presents with   Follow-up    Patient is here for left foot ulcer follow-up.    77 y.o. male presents for follow-up on left hallux ulcer.  He states the callus has recurred as well as the underlying discoloration of the skin.  Denies any drainage does have some redness and swelling in the left foot.  Has been having chronic issues with this toe in terms of the callus and underlying wound since March 2020 when he had the surgery done.  Denies any nausea vomiting fever chills.  Does have neuropathy and decreased sensation in the foot but denies not have a history of diabetes.  Past Medical History:  Diagnosis Date   Aortic root enlargement (HCC)    Atrial fibrillation with RVR ([place]) 07/13/2018   CAD (coronary artery disease)    Complication of anesthesia    Hypotensive with general anesth.   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    Ventricular septal defect     Allergies  Allergen Reactions   Ciprofloxacin Other (See Comments)    Bulging aorta   Codeine Nausea Only and Other (See Comments)    Can tolerate with benadryl   Diltiazem Cd [Diltiazem Hcl] Other (See Comments)    Bradycardia   Mupirocin Itching   Tramadol Hcl     Other reaction(s): rash   Bacitra-Neomycin-Polymyxin-Hc Rash   Hydrocodone Rash and Other (See Comments)    Can tolerate with benadryl   Neosporin [Bacitracin-Polymyxin B] Rash   Neosporin [Neomycin-Bacitracin Zn-Polymyx] Rash   Oxycodone Rash and Other (See Comments)    Can tolerate with benadryl    ROS: Negative except as per HPI above  Objective:  General: AAO x3, NAD  Dermatological: Attention directed to the left hallux  predebridement there is noted to be a hyperkeratotic lesion with underlying ulceration. Post debridement measures approx 1.5x0.7 x0.4 cm. Bloody drainage and maceration in the wound base. Mild erythema and edema about the IPJ.     Vascular:  Dorsalis Pedis artery and Posterior Tibial artery pedal pulses are 2/4 bilateral.  Capillary fill time < 3 sec to all digits.   Neruologic: Grossly absent via light touch left forefoot and left hallux.   Musculoskeletal: Edema and mild erythema about the left hallux IPJ.   Gait: Unassisted, Nonantalgic.    Radiographs:  Date: 10/20/2022 XR left foot weightbearing AP/Lateral/Oblique   Findings: Attention directed to the left hallux there is noted to be a single surgical screw placed across the interphalangeal joint from distal proximal with the head completely buried in the distal phalanx.  There is nonunion of the interphalangeal joint.  There is osseous spur present at the medial aspect of the distal phalanx.  There is questionable sclerosis and very mild osteolysis at the medial aspect of the interphalangeal joint. Assessment:   1. Skin ulcer of great toe with fat layer exposed, left ([place])   2. Bone spur of toe of left foot   3. Nonunion of osteotomy site   4. Cellulitis and abscess of toe, left   5. Pre-ulcerative calluses   6. Pronation deformity of left foot   7. Coagulation defect (  [place])        Plan:  Patient was evaluated and treated and all questions answered.  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 -Discussed with the patient that I am concerned about the osseous spur at the medial aspect of the hallux distal phalanx that is near the area of the ulceration.  This could be infected or be causing pressure on the ulceration site. -Commend surgical  management to include resection of this spur as well as surrounding bone as well as sending this bone for bone biopsy -Would also consider resection of the distal aspect of the proximal phalanx. -Also did test the possibility of removal of the hallux interphalangeal joint screw however this would be very difficult given the very nature of the screw though would potentially attempt it at the same time as the other surgery. -Patient is full we discussed the risk benefits alternatives and possible complications including nonunion osteomyelitis need for further surgery and possible amputation of the left hallux.  He understands wishes to proceed with this for step in trying to get the callus and wound to heal.  Ulcer plantar medial aspect of the left hallux to the level of subcutaneous fat tissue, worse from prior wound persisting and callus returning very quickly -We discussed the etiology and factors that are a part of the wound healing process.  We also discussed the risk of infection both soft tissue and osteomyelitis from open ulceration.  Discussed the risk of limb loss if this happens or worsens. -Debridement as below. -Dressed with Betadine, DSD. -Continue home dressing changes daily with Betadine and gauze -Continue off-loading with postoperative shoe. -Vascular testing deferred patient strong pedal pulses -HgbA1c: 5.8 -Last antibiotics: Recommend a second course of Augmentin 875-125 mg x 10 days for mild cellulitis of the left hallux -Imaging: No imaging taken at this visit -Continue with local wound care for this ulceration  Procedure: Excisional Debridement of Wound Rationale: Removal of non-viable soft tissue from the wound to promote healing.  Anesthesia: none Post-Debridement Wound Measurements: 1 cm x 0.7 cm x 0.4 cm  Type of Debridement: Sharp Excisional Tissue Removed: Non-viable soft tissue Depth of Debridement: subcutaneous tissue. Technique: Sharp excisional debridement to  bleeding, viable wound base.  Dressing: Dry, sterile, compression dressing. Disposition: Patient tolerated procedure well.   No follow-ups on file.            Everitt Amber, DPM [facility]

## 2022-11-24 ENCOUNTER — Telehealth: Payer: Self-pay | Admitting: Podiatry

## 2022-11-24 NOTE — Telephone Encounter (Addendum)
DOS: 12/24/2022  Medicare Part A & B BCBS Medicare Supplement Effective 11/03/2022  Exostectomy Hallux Lt (22633) Bone Biopsy Lt (20900) Poss. Removal Fixation Hardware Lt (20680)  Medicare Deductible: $240 with $0 met BCBS Deductible: $0 Out-of-Pocket: $0   Prior authorization is not required for either of patients insurances.  Prior authorization for [employer] Supplement per [place].  Call Reference #: [id number]

## 2022-11-26 DIAGNOSIS — C61 Malignant neoplasm of prostate: Secondary | ICD-10-CM | POA: Diagnosis not present

## 2022-12-01 DIAGNOSIS — C61 Malignant neoplasm of prostate: Secondary | ICD-10-CM | POA: Diagnosis not present

## 2022-12-03 ENCOUNTER — Telehealth: Payer: Self-pay

## 2022-12-03 ENCOUNTER — Telehealth: Payer: Self-pay | Admitting: Internal Medicine

## 2022-12-03 DIAGNOSIS — C61 Malignant neoplasm of prostate: Secondary | ICD-10-CM | POA: Diagnosis not present

## 2022-12-03 NOTE — Telephone Encounter (Signed)
Patient with diagnosis of afib on Xarelto for anticoagulation.    Procedure: MINOR HARDWARE REMOVAL, EXOSTECTECTOMY TOE, & BONE BIOPSY   Date of procedure: 12/24/22  CHA2DS2-VASc Score = 3  This indicates a 3.2% annual risk of stroke. The patient's score is based upon: CHF History: 0 HTN History: 1 Diabetes History: 0 Stroke History: 0 Vascular Disease History: 1 Age Score: 1 Gender Score: 0  CrCl 28m/min using adjusted body weight Platelet count 175K  Of note, CHADS2VASc score will increase to 4 at the time of procedure as pt turns 77 in a week.  Per office protocol, patient can hold Xarelto for 2 days prior to procedure.    **This guidance is not considered finalized until pre-operative APP has relayed final recommendations.**

## 2022-12-03 NOTE — Telephone Encounter (Signed)
Primary Cardiologist:Kenneth C Hilty, MD   Preoperative team, please contact this patient and set up a phone call appointment for further preoperative risk assessment. Please obtain consent and complete medication review. Thank you for your help.   I confirm that guidance regarding antiplatelet and oral anticoagulation therapy has been completed and, if necessary, noted below.  Emmaline Life, NP-C  12/03/2022, 10:37 AM [address] Office [phone] Fax [phone]

## 2022-12-03 NOTE — Telephone Encounter (Signed)
  Patient Consent for Virtual Visit        Jose Stone has provided verbal consent on 12/03/2022 for a virtual visit (video or telephone).   CONSENT FOR VIRTUAL VISIT FOR:  Jose Stone  By participating in this virtual visit I agree to the following:  I hereby voluntarily request, consent and authorize [facility] and its employed or contracted physicians, physician assistants, nurse practitioners or other licensed health care professionals (the Practitioner), to provide me with telemedicine health care services (the "Services") as deemed necessary by the treating Practitioner. I acknowledge and consent to receive the Services by the Practitioner via telemedicine. I understand that the telemedicine visit will involve communicating with the Practitioner through live audiovisual communication technology and the disclosure of certain medical information by electronic transmission. I acknowledge that I have been given the opportunity to request an in-person assessment or other available alternative prior to the telemedicine visit and am voluntarily participating in the telemedicine visit.  I understand that I have the right to withhold or withdraw my consent to the use of telemedicine in the course of my care at any time, without affecting my right to future care or treatment, and that the Practitioner or I may terminate the telemedicine visit at any time. I understand that I have the right to inspect all information obtained and/or recorded in the course of the telemedicine visit and may receive copies of available information for a reasonable fee.  I understand that some of the potential risks of receiving the Services via telemedicine include:  Delay or interruption in medical evaluation due to technological equipment failure or disruption; Information transmitted may not be sufficient (e.g. poor resolution of images) to allow for appropriate medical decision making by the Practitioner;  and/or  In rare instances, security protocols could fail, causing a breach of personal health information.  Furthermore, I acknowledge that it is my responsibility to provide information about my medical history, conditions and care that is complete and accurate to the best of my ability. I acknowledge that Practitioner's advice, recommendations, and/or decision may be based on factors not within their control, such as incomplete or inaccurate data provided by me or distortions of diagnostic images or specimens that may result from electronic transmissions. I understand that the practice of medicine is not an exact science and that Practitioner makes no warranties or guarantees regarding treatment outcomes. I acknowledge that a copy of this consent can be made available to me via my patient portal ([facility]), or I can request a printed copy by calling the office of [facility].    I understand that my insurance will be billed for this visit.   I have read or had this consent read to me. I understand the contents of this consent, which adequately explains the benefits and risks of the Services being provided via telemedicine.  I have been provided ample opportunity to ask questions regarding this consent and the Services and have had my questions answered to my satisfaction. I give my informed consent for the services to be provided through the use of telemedicine in my medical care

## 2022-12-03 NOTE — Telephone Encounter (Signed)
Spoke with patient who is agreeable to do a tele visit on 2/7 at 2 pm. Med rec and consent done.

## 2022-12-03 NOTE — Progress Notes (Signed)
Spoke with shelly at [facility]. Requested preop orders for surgery

## 2022-12-03 NOTE — Telephone Encounter (Signed)
   Pre-operative Risk Assessment    Patient Name: Jose Stone  DOB: [birth year] MRN: [id number]      Request for Surgical Clearance    Procedure:  MINOR HARDWARE REMOVAL, EXOSTECTECTOMY TOE, & BONE BIOPSY   Date of Surgery:  Clearance 12/24/22                                 Surgeon:  Nena Alexander, Standiford DPM Surgeon's Group or Practice Name:  [facility]  Phone number:  [phone] Fax number:  [phone]   Type of Clearance Requested:   - Medical  - Pharmacy:  Hold TBD  by Cardiology   Type of Anesthesia:   Anesthesiologists choice    Additional requests/questions:    Crist Infante   12/03/2022, 8:52 AM

## 2022-12-05 DIAGNOSIS — C61 Malignant neoplasm of prostate: Secondary | ICD-10-CM | POA: Diagnosis not present

## 2022-12-08 DIAGNOSIS — C61 Malignant neoplasm of prostate: Secondary | ICD-10-CM | POA: Diagnosis not present

## 2022-12-08 NOTE — Progress Notes (Addendum)
Anesthesia Review:  PCP: Kathyrn Lass LOV 12/09/22 on chart.   Cardiologist   DR [name]  12/10/22- Televisit  Chest x-ray : 09/29/22- 2 view  Ct Angio- chest- 03/03/22  EKG  10/22/22  Echo  03/17/21  Stress test:- 2018  Cardiac Cath :  03/18/21  Afib ablation - 07/23/20  CT Card- 07/16/20  Activity level:  Sleep Study/ CPAP : 10/20/21- Sleep study  Fasting Blood Sugar :      / Checks Blood Sugar -- times a day:   Blood Thinner/ Instructions /Last Dose: ASA / Instructions/ Last Dose :    H and P on chart - PT just seen by  DR Kathyrn Lass on 12/09/22.    Xarelto - pt has phone apapt at 2pm on 12/10/22 with cardiology .  Informed pt he should received preop instrucitons from cardiology.  Pt voiced understanding.   AT appt on 12/09/22 with DR Kathyrn Lass pt reports he has UTI.  Placed on antibiotics.  Will call [phone] to find out name. Placed in meds.   PT currently received radiation tx for porstate.  Last treatment on 12/10/22 per pt.  Informed pt to inform DR Standiford he has uti prior to surgery.  PT voiced understanding.    Prediabetes- does not check glucose at home.  Hgba1c- 12/09/22-  5.5    PT called to PST on 12/12/22 and asked about preop instructions regarding supplements.  PT instructed by preop nurse to call DR [place]. Office.  PT voiced understanding.

## 2022-12-08 NOTE — Patient Instructions (Signed)
SURGICAL WAITING ROOM VISITATION  Patients having surgery or a procedure may have no more than 2 support people in the waiting area - these visitors may rotate.    Children under the age of 49 must have an adult with them who is not the patient.  Due to an increase in RSV and influenza rates and associated hospitalizations, children ages 30 and under may not visit patients in [place].  If the patient needs to stay at the hospital during part of their recovery, the visitor guidelines for inpatient rooms apply. Pre-op nurse will coordinate an appropriate time for 1 support person to accompany patient in pre-op.  This support person may not rotate.    Please refer to the [facility] website for the visitor guidelines for Inpatients (after your surgery is over and you are in a regular room).       Your procedure is scheduled on:  12/24/22    Report to [facility] Main Entrance    Report to admitting at  1115 AM   Call this number if you have problems the morning of surgery [phone]   Do not eat food or drink liquids  :After Midnight.                            If you have questions, please contact your surgeon's office.      Oral Hygiene is also important to reduce your risk of infection.                                    Remember - BRUSH YOUR TEETH THE MORNING OF SURGERY WITH YOUR REGULAR TOOTHPASTE  DENTURES WILL BE REMOVED PRIOR TO SURGERY PLEASE DO NOT APPLY "Poly grip" OR ADHESIVES!!!   Do NOT smoke after Midnight   Take these medicines the morning of surgery with A SIP OF WATER:  inhalers as usual and bring, allopurinol, eye drops as usual , protonix   DO NOT TAKE ANY ORAL DIABETIC MEDICATIONS DAY OF YOUR SURGERY  Bring CPAP mask and tubing day of surgery.                              You may not have any metal on your body including hair pins, jewelry, and body piercing             Do not wear make-up, lotions, powders, perfumes/cologne,  or deodorant  Do not wear nail polish including gel and S&S, artificial/acrylic nails, or any other type of covering on natural nails including finger and toenails. If you have artificial nails, gel coating, etc. that needs to be removed by a nail salon please have this removed prior to surgery or surgery may need to be canceled/ delayed if the surgeon/ anesthesia feels like they are unable to be safely monitored.   Do not shave  48 hours prior to surgery.               Men may shave face and neck.   Do not bring valuables to the hospital. [place].   Contacts, glasses, dentures or bridgework may not be worn into surgery.   Bring small overnight bag day of surgery.   DO  NOT [place]. PHARMACY WILL DISPENSE MEDICATIONS LISTED ON YOUR MEDICATION LIST TO YOU DURING YOUR ADMISSION [place]!    Patients discharged on the day of surgery will not be allowed to drive home.  Someone NEEDS to stay with you for the first 24 hours after anesthesia.   Special Instructions: Bring a copy of your healthcare power of attorney and living will documents the day of surgery if you haven't scanned them before.              Please read over the following fact sheets you were given: IF [place] [phone]   If you received a COVID test during your pre-op visit  it is requested that you wear a mask when out in public, stay away from anyone that may not be feeling well and notify your surgeon if you develop symptoms. If you test positive for Covid or have been in contact with anyone that has tested positive in the last 10 days please notify you surgeon.    [place] - Preparing for Surgery Before surgery, you can play an important role.  Because skin is not sterile, your skin needs to be as free of germs as possible.  You can reduce the number of germs on your skin by  washing with CHG (chlorahexidine gluconate) soap before surgery.  CHG is an antiseptic cleaner which kills germs and bonds with the skin to continue killing germs even after washing. Please DO NOT use if you have an allergy to CHG or antibacterial soaps.  If your skin becomes reddened/irritated stop using the CHG and inform your nurse when you arrive at Short Stay. Do not shave (including legs and underarms) for at least 48 hours prior to the first CHG shower.  You may shave your face/neck. Please follow these instructions carefully:  1.  Shower with CHG Soap the night before surgery and the  morning of Surgery.  2.  If you choose to wash your hair, wash your hair first as usual with your  normal  shampoo.  3.  After you shampoo, rinse your hair and body thoroughly to remove the  shampoo.                           4.  Use CHG as you would any other liquid soap.  You can apply chg directly  to the skin and wash                       Gently with a scrungie or clean washcloth.  5.  Apply the CHG Soap to your body ONLY FROM THE NECK DOWN.   Do not use on face/ open                           Wound or open sores. Avoid contact with eyes, ears mouth and genitals (private parts).                       Wash face,  Genitals (private parts) with your normal soap.             6.  Wash thoroughly, paying special attention to the area where your surgery  will be performed.  7.  Thoroughly rinse your body with warm water from the neck down.  8.  DO NOT shower/wash with your normal soap after using and rinsing off  the CHG Soap.                9.  Pat yourself dry with a clean towel.            10.  Wear clean pajamas.            11.  Place clean sheets on your bed the night of your first shower and do not  sleep with pets. Day of Surgery : Do not apply any lotions/deodorants the morning of surgery.  Please wear clean clothes to the hospital/surgery center.  FAILURE TO FOLLOW THESE INSTRUCTIONS MAY RESULT IN THE  CANCELLATION OF YOUR SURGERY PATIENT SIGNATURE_________________________________  NURSE SIGNATURE__________________________________  ________________________________________________________________________

## 2022-12-09 ENCOUNTER — Encounter (HOSPITAL_COMMUNITY)
Admission: RE | Admit: 2022-12-09 | Discharge: 2022-12-09 | Disposition: A | Discharge status: Home / Self Care | Payer: Medicare Other | Source: Ambulatory Visit | Attending: Podiatry | Admitting: Podiatry

## 2022-12-09 ENCOUNTER — Other Ambulatory Visit: Payer: Self-pay

## 2022-12-09 ENCOUNTER — Encounter (HOSPITAL_COMMUNITY): Payer: Self-pay

## 2022-12-09 VITALS — BP 146/71 | HR 74 | Temp 98.4°F | Resp 16 | Ht 72.0 in | Wt 231.0 lb

## 2022-12-09 DIAGNOSIS — Z01818 Encounter for other preprocedural examination: Secondary | ICD-10-CM | POA: Insufficient documentation

## 2022-12-09 DIAGNOSIS — M79675 Pain in left toe(s): Secondary | ICD-10-CM | POA: Diagnosis not present

## 2022-12-09 DIAGNOSIS — R350 Frequency of micturition: Secondary | ICD-10-CM | POA: Diagnosis not present

## 2022-12-09 DIAGNOSIS — E139 Other specified diabetes mellitus without complications: Secondary | ICD-10-CM | POA: Insufficient documentation

## 2022-12-09 DIAGNOSIS — R7303 Prediabetes: Secondary | ICD-10-CM | POA: Diagnosis not present

## 2022-12-09 DIAGNOSIS — C61 Malignant neoplasm of prostate: Secondary | ICD-10-CM | POA: Diagnosis not present

## 2022-12-09 HISTORY — DX: Sleep apnea, unspecified: G47.30

## 2022-12-09 HISTORY — DX: Acute myocardial infarction, unspecified: I21.9

## 2022-12-09 HISTORY — DX: Prediabetes: R73.03

## 2022-12-09 HISTORY — DX: Ventricular septal defect: Q21.0

## 2022-12-09 HISTORY — DX: Anemia, unspecified: D64.9

## 2022-12-09 LAB — HEMOGLOBIN A1C
Hgb A1c MFr Bld: 5.5 % (ref 4.8–5.6)
Mean Plasma Glucose: 111.15 mg/dL

## 2022-12-09 LAB — CBC
HCT: 44.7 % (ref 39.0–52.0)
Hemoglobin: 14.5 g/dL (ref 13.0–17.0)
MCH: 29.8 pg (ref 26.0–34.0)
MCHC: 32.4 g/dL (ref 30.0–36.0)
MCV: 92 fL (ref 80.0–100.0)
Platelets: 178 10*3/uL (ref 150–400)
RBC: 4.86 MIL/uL (ref 4.22–5.81)
RDW: 13.9 % (ref 11.5–15.5)
WBC: 6.5 10*3/uL (ref 4.0–10.5)
nRBC: 0 % (ref 0.0–0.2)

## 2022-12-09 LAB — BASIC METABOLIC PANEL
Anion gap: 7 (ref 5–15)
BUN: 21 mg/dL (ref 8–23)
CO2: 27 mmol/L (ref 22–32)
Calcium: 8.9 mg/dL (ref 8.9–10.3)
Chloride: 104 mmol/L (ref 98–111)
Creatinine, Ser: 0.99 mg/dL (ref 0.61–1.24)
GFR, Estimated: >60 mL/min (ref >60)
Glucose, Bld: 131 mg/dL — ABNORMAL HIGH (ref 70–99)
Potassium: 4.3 mmol/L (ref 3.5–5.1)
Sodium: 138 mmol/L (ref 135–145)

## 2022-12-09 LAB — GLUCOSE, CAPILLARY: Glucose-Capillary: 138 mg/dL — ABNORMAL HIGH (ref 70–99)

## 2022-12-10 ENCOUNTER — Encounter (HOSPITAL_COMMUNITY)
Admission: RE | Admit: 2022-12-10 | Discharge: 2022-12-10 | Disposition: A | Discharge status: Home / Self Care | Payer: Medicare Other | Source: Ambulatory Visit | Attending: Podiatry | Admitting: Podiatry

## 2022-12-10 ENCOUNTER — Ambulatory Visit (INDEPENDENT_AMBULATORY_CARE_PROVIDER_SITE_OTHER): Payer: Medicare Other | Admitting: Nurse Practitioner

## 2022-12-10 DIAGNOSIS — Z01810 Encounter for preprocedural cardiovascular examination: Secondary | ICD-10-CM

## 2022-12-10 DIAGNOSIS — C61 Malignant neoplasm of prostate: Secondary | ICD-10-CM | POA: Diagnosis not present

## 2022-12-10 NOTE — Progress Notes (Signed)
Virtual Visit via Telephone Note   Because of Jose Stone's co-morbid illnesses, he is at least at moderate risk for complications without adequate follow up.  This format is felt to be most appropriate for this patient at this time.  The patient did not have access to video technology/had technical difficulties with video requiring transitioning to audio format only (telephone).  All issues noted in this document were discussed and addressed.  No physical exam could be performed with this format.  Please refer to the patient's chart for his consent to telehealth for Jose Stone.  Evaluation Performed:  Preoperative cardiovascular risk assessment _____________   Date:  12/10/2022   Patient ID:  Jose Stone, DOB May 22, 1946, MRN [id number] Patient Location:  Home Provider location:   Office  Primary Care Provider:  Kathyrn Lass, MD Primary Cardiologist:  [name], MD  Chief Complaint / Patient Profile   77 y.o. y/o male with a h/o CAD s/p DES-OM, persistent atrial fibrillation/atrial flutter s/p A-fib ablation, congenital heart disease with restrictive VSD, ectatic aorta, AI, hypertension, dyslipidemia, and prostate cancer who is pending minor hardware removal, exostectomy toe, and bone biopsy on 12/24/2022 with Dr. Yevonne Pax of [facility] and presents today for telephonic preoperative cardiovascular risk assessment.  History of Present Illness    Jose Stone is a 77 y.o. male who presents via audio/video conferencing for a telehealth visit today.  Pt was last seen in cardiology clinic on 04/22/2022 by Dr. [name].  He was seen by Dr. Claiborne Billings on 10/22/2022 for follow-up sleep evaluation. At that time Jose Stone was doing well. The patient is now pending procedure as outlined above. Since his last visit, he has done well from a cardiac standpoint.   He denies chest pain, palpitations, dyspnea, pnd, orthopnea, n, v, dizziness, syncope, edema, weight  gain, or early satiety. All other systems reviewed and are otherwise negative except as noted above.  Past Medical History    Past Medical History:  Diagnosis Date   Anemia    Aortic root enlargement (HCC)    Atrial fibrillation with RVR ([place]) 07/13/2018   CAD (coronary artery disease)    Complication of anesthesia    Hypotensive with general anesth.   GERD (gastroesophageal reflux disease)    Hyperlipidemia    Hypertension    Melanoma ([place])    prostate cancer also - rad tx completes on 12/10/22   Myocardial infarction ([place])    2015   Persistent atrial fibrillation (HCC)    Afib,Aflutter   Pre-diabetes    Sleep apnea    cpap   Typical atrial flutter ([place])    Vasc compress esophag aberr right subclav artry aris from desc aorta    Ventricular septal defect    VSD (ventricular septal defect)    Past Surgical History:  Procedure Laterality Date   ATRIAL FIBRILLATION ABLATION N/A 07/20/2020   Procedure: ATRIAL FIBRILLATION ABLATION;  Surgeon: [name], MD;  Location: [place] CV LAB;  Service: Cardiovascular;  Laterality: N/A;   CARDIAC CATHETERIZATION     CARDIOVERSION N/A 02/29/2020   Procedure: CARDIOVERSION;  Surgeon: [name], MD;  Location: [place];  Service: Cardiovascular;  Laterality: N/A;   CARDIOVERSION N/A 06/28/2020   Procedure: CARDIOVERSION;  Surgeon: Thayer Headings, MD;  Location: [facility] ENDOSCOPY;  Service: Cardiovascular;  Laterality: N/A;   CORONARY STENT INTERVENTION N/A 03/18/2021   Procedure: CORONARY STENT INTERVENTION;  Surgeon: Burnell Blanks, MD;  Location:  [place] INVASIVE CV LAB;  Service: Cardiovascular;  Laterality: N/A;   DISTAL INTERPHALANGEAL JOINT FUSION Left 01/19/2019   Procedure: HALLUX INTERPHANGEAL JOINT FUSION LEFT FOOT;  Surgeon: Trula Slade, DPM;  Location: [place];  Service: Podiatry;  Laterality: Left;  LEFT BLOCK   hipospedious     LEFT HEART CATH AND CORONARY ANGIOGRAPHY N/A 03/18/2021   Procedure: LEFT HEART  CATH AND CORONARY ANGIOGRAPHY;  Surgeon: Burnell Blanks, MD;  Location: [place] CV LAB;  Service: Cardiovascular;  Laterality: N/A;   LEFT HEART CATHETERIZATION WITH CORONARY ANGIOGRAM N/A 08/14/2014   Procedure: LEFT HEART CATHETERIZATION WITH CORONARY ANGIOGRAM;  Surgeon: Birdie Riddle, MD;  Location: [place] CATH LAB;  Service: Cardiovascular;  Laterality: N/A;   MASS EXCISION Left 01/19/2019   Procedure: EXCISION BENIGN LESION 2.OCM LEFT FOOT;  Surgeon: Trula Slade, DPM;  Location: [place];  Service: Podiatry;  Laterality: Left;   SHOULDER ARTHROSCOPY WITH SUBACROMIAL DECOMPRESSION Left 05/03/2020   Procedure: SHOULDER ARTHROSCOPY DEBRIDEMENT PARTIAL ROTATOR CUFF REPAIR, SUBACROMIAL DECOMPRESSION, BICEPS TENOTOMY;  Surgeon: Tania Ade, MD;  Location: [facility];  Service: Orthopedics;  Laterality: Left;   SHOULDER SURGERY     TONSILLECTOMY  1955    Allergies  Allergies  Allergen Reactions   Ciprofloxacin Other (See Comments)    Aggravates Bulging aorta   Codeine Nausea Only and Other (See Comments)    Can tolerate with benadryl   Diltiazem Cd [Diltiazem Hcl] Other (See Comments)    Bradycardia   Mupirocin Itching   Bacitra-Neomycin-Polymyxin-Hc Rash   Hydrocodone Rash and Other (See Comments)    Can tolerate with benadryl   Neosporin [Bacitracin-Polymyxin B] Rash   Neosporin [Neomycin-Bacitracin Zn-Polymyx] Rash   Oxycodone Rash and Other (See Comments)    Can tolerate with benadryl   Tramadol Hcl Rash    Home Medications    Prior to Admission medications   Medication Sig Start Date End Date Taking? Authorizing Provider  acetaminophen (TYLENOL) 500 MG tablet Take 500-1,000 mg by mouth every 6 (six) hours as needed for mild pain or headache.    [provider]  albuterol (VENTOLIN HFA) 108 (90 Base) MCG/ACT inhaler Inhale 2 puffs into the lungs every 6 (six) hours as needed for wheezing or shortness of breath. 11/01/21   [provider]   alendronate (FOSAMAX) 70 MG tablet Take 70 mg by mouth every Wednesday. Take with a full glass of water on an empty stomach.    [provider]  allopurinol (ZYLOPRIM) 100 MG tablet Take 200 mg by mouth in the morning. 03/26/18   [provider]  amoxicillin-clavulanate (AUGMENTIN) 875-125 MG tablet Take 1 tablet by mouth 2 (two) times daily. Patient not taking: Reported on 12/03/2022 10/20/22   Standiford, Nena Alexander, DPM  amoxicillin-clavulanate (AUGMENTIN) 875-125 MG tablet Take 1 tablet by mouth 2 (two) times daily. Patient not taking: Reported on 12/03/2022 11/20/22   Standiford, Nena Alexander, DPM  atorvastatin (LIPITOR) 80 MG tablet TAKE 1 TABLET BY MOUTH DAILY AT 6 PM Patient taking differently: Take 80 mg by mouth every evening. 07/28/22   Hilty, Nadean Corwin, MD  COLCRYS 0.6 MG tablet Take 0.6 mg by mouth daily as needed (FOR GOUT FLARES). 05/26/14   [provider]  ferrous sulfate 325 (65 FE) MG tablet Take 325 mg by mouth every evening. 03/26/21   [provider]  ketotifen (ZADITOR) 0.025 % ophthalmic solution Place 1 drop into both eyes 2 (two) times daily.    [provider]  Light Mineral Oil-Mineral Oil (RETAINE MGD) 0.5-0.5 % EMUL Place 1 drop into both eyes in the morning, at noon, and at bedtime.    [provider]  loratadine (CLARITIN) 10 MG tablet Take 10 mg by mouth daily as needed for allergies.    [provider]  Multiple Vitamins-Minerals (CENTRUM SILVER 50+MEN) TABS Take 1 tablet by mouth daily with breakfast.    [provider]  nitrofurantoin, macrocrystal-monohydrate, (MACROBID) 100 MG capsule Take 100 mg by mouth 2 (two) times daily. Start on 12/09/22    [provider]  nitroGLYCERIN (NITROSTAT) 0.4 MG SL tablet PLACE 1 TAB UNDER TONGUE EVERY 5 MINS AS NEEDED FOR CHEST PAIN,MAX 3 DOSES 03/19/21   Hilty, Nadean Corwin, MD  Omega-3 Fatty Acids (OMEGA 3 PO) Take 3 capsules by mouth daily.    [provider]  pantoprazole (PROTONIX) 40 MG tablet TAKE 1 TABLET BY MOUTH EVERY DAY Patient taking differently: Take 40 mg by mouth daily before breakfast. 03/17/22   Hilty, Nadean Corwin, MD  sildenafil (REVATIO) 20 MG tablet Take 20 mg by mouth as needed for erectile dysfunction.    [provider]  tamsulosin (FLOMAX) 0.4 MG CAPS capsule Take 0.4 mg by mouth.  2 times daily    [provider]  valsartan (DIOVAN) 160 MG tablet TAKE 1 TABLET BY MOUTH EVERY DAY Patient taking differently: Take 160 mg by mouth in the morning. 05/22/22   Hilty, Nadean Corwin, MD  vitamin B-12 (CYANOCOBALAMIN) 500 MCG tablet Take 500 mcg by mouth every evening. 03/26/21   [provider]  XARELTO 20 MG TABS tablet TAKE 1 TABLET BY MOUTH DAILY WITH SUPPER 11/18/22   Hilty, Nadean Corwin, MD    Physical Exam    Vital Signs:  Jose Stone does not have vital signs available for review today.  Given telephonic nature of communication, physical exam is limited. AAOx3. NAD. Normal affect.  Speech and respirations are unlabored.  Accessory Clinical Findings    None  Assessment & Plan    1.  Preoperative Cardiovascular Risk Assessment:  According to the Revised Cardiac Risk Index (RCRI), his Perioperative Risk of Major Cardiac Event is (%): 0.9. His Functional Capacity in METs is: 8.33 according to the Duke Activity Status Index (DASI). Therefore, based on ACC/AHA guidelines, patient would be at acceptable risk for the planned procedure without further cardiovascular testing.  The patient was advised that if he develops new symptoms prior to surgery to contact our office to arrange for a follow-up visit, and he verbalized understanding.  Per office protocol, patient can hold Xarelto for 2 days prior to procedure.  Please resume Xarelto as soon as possible postprocedure, at the discretion of the surgeon.    A copy of this note will be routed to requesting surgeon.  Time:    Today, I have spent  7 minutes with the patient with telehealth technology discussing medical history, symptoms, and management plan.     Lenna Sciara, NP  12/10/2022, 2:09 PM

## 2022-12-11 ENCOUNTER — Encounter (HOSPITAL_COMMUNITY): Payer: Self-pay | Admitting: *Deleted

## 2022-12-11 NOTE — Progress Notes (Signed)
Anesthesia Chart Review   Case: [id number] Date/Time: 12/24/22 1300   Procedures:      HARDWARE REMOVAL (Left)     EXOSTECTECTOMY TOE (Left)     BONE BIOPSY (Left)   Anesthesia type: Choice   Pre-op diagnosis:      Painful Hardware     Skin ulcer of great toe     Bone spur of toe of left foot   [facility] ROOM 06 / [facility]   Surgeons: Yevonne Pax, DPM       DISCUSSION:77 y.o. never smoker with h/o HTN, CAD (DES), atrial fibrillation, sleep apnea, painful hardware, bone spur to left foot scheduled for above procedure 12/24/22 with Ames Coupe, DPM.   Pt last seen by cardiology 06/10/2023. Per OV note, "According to the Revised Cardiac Risk Index (RCRI), his Perioperative Risk of Major Cardiac Event is (%): 0.9. His Functional Capacity in METs is: 8.33 according to the Duke Activity Status Index (DASI). Therefore, based on ACC/AHA guidelines, patient would be at acceptable risk for the planned procedure without further cardiovascular testing.   The patient was advised that if he develops new symptoms prior to surgery to contact our office to arrange for a follow-up visit, and he verbalized understanding.   Per office protocol, patient can hold Xarelto for 2 days prior to procedure.  Please resume Xarelto as soon as possible postprocedure, at the discretion of the surgeon."  H&P on chart.  VS: There were no vitals taken for this visit.  PROVIDERS: Kathyrn Lass, MD is PCP   Primary Cardiologist:  [name], MD  LABS: Labs reviewed: Acceptable for surgery. (all labs ordered are listed, but only abnormal results are displayed)  Labs Reviewed - No data to display   IMAGES:   EKG:   CV: Cardiac Cath 03/18/2021 Prox RCA lesion is 60% stenosed. 1st Mrg-1 lesion is 95% stenosed. 1st Mrg-2 lesion is 10% stenosed. A drug-eluting stent was successfully placed using a SYNERGY XD 2.75X12. Post intervention, there is a 0% residual stenosis. LPAV lesion is 60%  stenosed. Prox Cx to Mid Cx lesion is 50% stenosed. Prox LAD to Mid LAD lesion is 60% stenosed. Mid LAD lesion is 60% stenosed.   1. Moderate disease in the mid LAD that does not appear to be changed from last cath. These lesions do not appear to be flow limiting 2. Severe stenosis proximal segment of the first obtuse marginal branch just proximal to the old stent. Moderate mid and distal Circumflex stenosis.  3. Moderate disease in the small non-dominant RCA 4. Successful PTCA/DES x 1 obtuse marginal branch.    Recommendations: He will need DAPT with ASA and Plavix for one month then would stop ASA and continue Plavix along with the Xarelto.   Echo 03/17/2021 1. 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  Left ventricular ejection fraction, by estimation, is 55 to 60%. The left  ventricle has normal function. The left ventricle has no regional wall  motion abnormalities. There is mild  left ventricular hypertrophy. Left ventricular diastolic parameters were  normal.   2. RV-RA gradient 36 mmHg suggesting at least mildly increased RVSP.  Right ventricular systolic function is normal. The right ventricular size  is normal.   3. Left atrial size was severely dilated.   4. The mitral valve is grossly normal. Trivial mitral valve  regurgitation.   5. The aortic valve is tricuspid. Aortic valve regurgitation is trivial.  Mild aortic valve sclerosis is  present, with no evidence of aortic valve  stenosis.   6. Aortic dilatation noted. There is moderate dilatation of the aortic  root, measuring 44 mm.   7. Unable to estimate CVP.   Past Medical History:  Diagnosis Date   Anemia    Aortic root enlargement (HCC)    Atrial fibrillation with RVR ([place]) 07/13/2018   CAD (coronary artery disease)    Complication of anesthesia    Hypotensive with general anesth.   GERD (gastroesophageal reflux disease)    Hyperlipidemia    Hypertension    Melanoma ([place])     prostate cancer also - rad tx completes on 12/10/22   Myocardial infarction ([place])    2015   Persistent atrial fibrillation (HCC)    Afib,Aflutter   Pre-diabetes    Sleep apnea    cpap   Typical atrial flutter ([place])    Vasc compress esophag aberr right subclav artry aris from desc aorta    Ventricular septal defect    VSD (ventricular septal defect)     Past Surgical History:  Procedure Laterality Date   ATRIAL FIBRILLATION ABLATION N/A 07/20/2020   Procedure: ATRIAL FIBRILLATION ABLATION;  Surgeon: [name], MD;  Location: [place] CV LAB;  Service: Cardiovascular;  Laterality: N/A;   CARDIAC CATHETERIZATION     CARDIOVERSION N/A 02/29/2020   Procedure: CARDIOVERSION;  Surgeon: [name], MD;  Location: [place];  Service: Cardiovascular;  Laterality: N/A;   CARDIOVERSION N/A 06/28/2020   Procedure: CARDIOVERSION;  Surgeon: Thayer Headings, MD;  Location: [facility] ENDOSCOPY;  Service: Cardiovascular;  Laterality: N/A;   CORONARY STENT INTERVENTION N/A 03/18/2021   Procedure: CORONARY STENT INTERVENTION;  Surgeon: Burnell Blanks, MD;  Location: [place] CV LAB;  Service: Cardiovascular;  Laterality: N/A;   DISTAL INTERPHALANGEAL JOINT FUSION Left 01/19/2019   Procedure: HALLUX INTERPHANGEAL JOINT FUSION LEFT FOOT;  Surgeon: Trula Slade, DPM;  Location: [place];  Service: Podiatry;  Laterality: Left;  LEFT BLOCK   hipospedious     LEFT HEART CATH AND CORONARY ANGIOGRAPHY N/A 03/18/2021   Procedure: LEFT HEART CATH AND CORONARY ANGIOGRAPHY;  Surgeon: Burnell Blanks, MD;  Location: [place] CV LAB;  Service: Cardiovascular;  Laterality: N/A;   LEFT HEART CATHETERIZATION WITH CORONARY ANGIOGRAM N/A 08/14/2014   Procedure: LEFT HEART CATHETERIZATION WITH CORONARY ANGIOGRAM;  Surgeon: Birdie Riddle, MD;  Location: [place] CATH LAB;  Service: Cardiovascular;  Laterality: N/A;   MASS EXCISION Left 01/19/2019   Procedure: EXCISION BENIGN LESION 2.OCM LEFT FOOT;   Surgeon: Trula Slade, DPM;  Location: [place];  Service: Podiatry;  Laterality: Left;   SHOULDER ARTHROSCOPY WITH SUBACROMIAL DECOMPRESSION Left 05/03/2020   Procedure: SHOULDER ARTHROSCOPY DEBRIDEMENT PARTIAL ROTATOR CUFF REPAIR, SUBACROMIAL DECOMPRESSION, BICEPS TENOTOMY;  Surgeon: Tania Ade, MD;  [facility];  Service: Orthopedics;  Laterality: Left;   SHOULDER SURGERY     TONSILLECTOMY  1955    MEDICATIONS: No current facility-administered medications for this encounter.    acetaminophen (TYLENOL) 500 MG tablet   albuterol (VENTOLIN HFA) 108 (90 Base) MCG/ACT inhaler   alendronate (FOSAMAX) 70 MG tablet   allopurinol (ZYLOPRIM) 100 MG tablet   atorvastatin (LIPITOR) 80 MG tablet   COLCRYS 0.6 MG tablet   ferrous sulfate 325 (65 FE) MG tablet   ketotifen (ZADITOR) 0.025 % ophthalmic solution   Light Mineral Oil-Mineral Oil (RETAINE MGD) 0.5-0.5 % EMUL   loratadine (CLARITIN) 10 MG tablet   Multiple Vitamins-Minerals (CENTRUM SILVER 50+MEN) TABS  nitroGLYCERIN (NITROSTAT) 0.4 MG SL tablet   Omega-3 Fatty Acids (OMEGA 3 PO)   pantoprazole (PROTONIX) 40 MG tablet   sildenafil (REVATIO) 20 MG tablet   valsartan (DIOVAN) 160 MG tablet   vitamin B-12 (CYANOCOBALAMIN) 500 MCG tablet   XARELTO 20 MG TABS tablet   amoxicillin-clavulanate (AUGMENTIN) 875-125 MG tablet   amoxicillin-clavulanate (AUGMENTIN) 875-125 MG tablet   nitrofurantoin, macrocrystal-monohydrate, (MACROBID) 100 MG capsule   tamsulosin (FLOMAX) 0.4 MG CAPS capsule     Konrad Felix Ward, PA-C [facility] [phone]

## 2022-12-23 ENCOUNTER — Other Ambulatory Visit (INDEPENDENT_AMBULATORY_CARE_PROVIDER_SITE_OTHER): Payer: Medicare Other | Admitting: Podiatry

## 2022-12-23 ENCOUNTER — Encounter (HOSPITAL_COMMUNITY): Payer: Self-pay | Admitting: Podiatry

## 2022-12-23 DIAGNOSIS — L97522 Non-pressure chronic ulcer of other part of left foot with fat layer exposed: Secondary | ICD-10-CM

## 2022-12-23 DIAGNOSIS — M9689 Other intraoperative and postprocedural complications and disorders of the musculoskeletal system: Secondary | ICD-10-CM

## 2022-12-23 DIAGNOSIS — Z9889 Other specified postprocedural states: Secondary | ICD-10-CM

## 2022-12-23 DIAGNOSIS — M7752 Other enthesopathy of left foot: Secondary | ICD-10-CM

## 2022-12-23 MED ORDER — ONDANSETRON HCL 4 MG PO TABS: 4.0000 mg | ORAL_TABLET | Freq: Three times a day (TID) | ORAL | 0 refills | Status: DC | PRN

## 2022-12-23 MED ORDER — OXYCODONE-ACETAMINOPHEN 5-325 MG PO TABS: 1.0000 | ORAL_TABLET | ORAL | 0 refills | Status: AC | PRN

## 2022-12-23 MED ORDER — AMOXICILLIN-POT CLAVULANATE 875-125 MG PO TABS: 1.0000 | ORAL_TABLET | Freq: Two times a day (BID) | ORAL | 0 refills | Status: AC

## 2022-12-23 NOTE — Progress Notes (Signed)
Postop medications sent 

## 2022-12-24 ENCOUNTER — Encounter: Payer: Self-pay | Admitting: Podiatry

## 2022-12-24 ENCOUNTER — Ambulatory Visit (HOSPITAL_COMMUNITY)
Admission: RE | Admit: 2022-12-24 | Discharge: 2022-12-24 | Disposition: A | Discharge status: Home / Self Care | Payer: Medicare Other | Source: Ambulatory Visit | Attending: Podiatry | Admitting: Podiatry

## 2022-12-24 ENCOUNTER — Other Ambulatory Visit: Payer: Self-pay

## 2022-12-24 ENCOUNTER — Encounter (HOSPITAL_COMMUNITY): Payer: Self-pay | Admitting: Podiatry

## 2022-12-24 ENCOUNTER — Ambulatory Visit (HOSPITAL_COMMUNITY): Payer: Medicare Other | Admitting: Physician Assistant

## 2022-12-24 ENCOUNTER — Encounter (HOSPITAL_COMMUNITY)
Admission: RE | Disposition: A | Discharge status: Home / Self Care | Payer: Self-pay | Source: Ambulatory Visit | Attending: Podiatry

## 2022-12-24 ENCOUNTER — Ambulatory Visit (HOSPITAL_COMMUNITY): Payer: Medicare Other

## 2022-12-24 DIAGNOSIS — M7752 Other enthesopathy of left foot: Secondary | ICD-10-CM | POA: Insufficient documentation

## 2022-12-24 DIAGNOSIS — M1990 Unspecified osteoarthritis, unspecified site: Secondary | ICD-10-CM | POA: Insufficient documentation

## 2022-12-24 DIAGNOSIS — K219 Gastro-esophageal reflux disease without esophagitis: Secondary | ICD-10-CM | POA: Insufficient documentation

## 2022-12-24 DIAGNOSIS — G4730 Sleep apnea, unspecified: Secondary | ICD-10-CM

## 2022-12-24 DIAGNOSIS — M9689 Other intraoperative and postprocedural complications and disorders of the musculoskeletal system: Secondary | ICD-10-CM | POA: Diagnosis not present

## 2022-12-24 DIAGNOSIS — M879 Osteonecrosis, unspecified: Secondary | ICD-10-CM | POA: Diagnosis not present

## 2022-12-24 DIAGNOSIS — I4891 Unspecified atrial fibrillation: Secondary | ICD-10-CM | POA: Diagnosis not present

## 2022-12-24 DIAGNOSIS — T847XXA Infection and inflammatory reaction due to other internal orthopedic prosthetic devices, implants and grafts, initial encounter: Secondary | ICD-10-CM | POA: Insufficient documentation

## 2022-12-24 DIAGNOSIS — M778 Other enthesopathies, not elsewhere classified: Secondary | ICD-10-CM | POA: Diagnosis not present

## 2022-12-24 DIAGNOSIS — M87875 Other osteonecrosis, left foot: Secondary | ICD-10-CM | POA: Diagnosis not present

## 2022-12-24 DIAGNOSIS — L97529 Non-pressure chronic ulcer of other part of left foot with unspecified severity: Secondary | ICD-10-CM

## 2022-12-24 DIAGNOSIS — Y798 Miscellaneous orthopedic devices associated with adverse incidents, not elsewhere classified: Secondary | ICD-10-CM | POA: Insufficient documentation

## 2022-12-24 DIAGNOSIS — Z472 Encounter for removal of internal fixation device: Secondary | ICD-10-CM | POA: Diagnosis not present

## 2022-12-24 DIAGNOSIS — Z9889 Other specified postprocedural states: Secondary | ICD-10-CM | POA: Diagnosis not present

## 2022-12-24 DIAGNOSIS — T8484XA Pain due to internal orthopedic prosthetic devices, implants and grafts, initial encounter: Secondary | ICD-10-CM | POA: Diagnosis not present

## 2022-12-24 DIAGNOSIS — I2510 Atherosclerotic heart disease of native coronary artery without angina pectoris: Secondary | ICD-10-CM | POA: Diagnosis not present

## 2022-12-24 DIAGNOSIS — Z01818 Encounter for other preprocedural examination: Secondary | ICD-10-CM

## 2022-12-24 DIAGNOSIS — Z955 Presence of coronary angioplasty implant and graft: Secondary | ICD-10-CM | POA: Diagnosis not present

## 2022-12-24 DIAGNOSIS — T84629A Infection and inflammatory reaction due to internal fixation device of unspecified bone of leg, initial encounter: Secondary | ICD-10-CM | POA: Diagnosis not present

## 2022-12-24 DIAGNOSIS — Z4889 Encounter for other specified surgical aftercare: Secondary | ICD-10-CM | POA: Diagnosis not present

## 2022-12-24 DIAGNOSIS — I10 Essential (primary) hypertension: Secondary | ICD-10-CM | POA: Insufficient documentation

## 2022-12-24 DIAGNOSIS — M25775 Osteophyte, left foot: Secondary | ICD-10-CM | POA: Diagnosis not present

## 2022-12-24 DIAGNOSIS — L97522 Non-pressure chronic ulcer of other part of left foot with fat layer exposed: Secondary | ICD-10-CM | POA: Diagnosis not present

## 2022-12-24 HISTORY — PX: BONE BIOPSY: SHX375

## 2022-12-24 HISTORY — PX: EXOSTECTECTOMY TOE: SHX6618

## 2022-12-24 HISTORY — PX: HARDWARE REMOVAL: SHX979

## 2022-12-24 SURGERY — REMOVAL, HARDWARE
Anesthesia: Monitor Anesthesia Care | Laterality: Left

## 2022-12-24 MED ORDER — PROPOFOL 10 MG/ML IV BOLUS
INTRAVENOUS | Status: AC
  Filled 2022-12-24: qty 20

## 2022-12-24 MED ORDER — PROPOFOL 500 MG/50ML IV EMUL
INTRAVENOUS | Status: AC
  Filled 2022-12-24: qty 50

## 2022-12-24 MED ORDER — ACETAMINOPHEN 500 MG PO TABS: 1000.0000 mg | ORAL_TABLET | Freq: Once | ORAL | Status: DC

## 2022-12-24 MED ORDER — LACTATED RINGERS IV SOLN: INTRAVENOUS | Status: DC

## 2022-12-24 MED ORDER — LIDOCAINE HCL (CARDIAC) PF 100 MG/5ML IV SOSY
PREFILLED_SYRINGE | INTRAVENOUS | Status: DC | PRN
  Administered 2022-12-24: 50 mg via INTRAVENOUS

## 2022-12-24 MED ORDER — CHLORHEXIDINE GLUCONATE 0.12 % MT SOLN
15.0000 mL | Freq: Once | OROMUCOSAL | Status: AC
  Administered 2022-12-24: 15 mL via OROMUCOSAL

## 2022-12-24 MED ORDER — CEFAZOLIN SODIUM-DEXTROSE 2-4 GM/100ML-% IV SOLN
2.0000 g | Freq: Once | INTRAVENOUS | Status: AC
  Administered 2022-12-24: 2 g via INTRAVENOUS
  Filled 2022-12-24: qty 100

## 2022-12-24 MED ORDER — BUPIVACAINE HCL (PF) 0.5 % IJ SOLN
INTRAMUSCULAR | Status: AC
  Filled 2022-12-24: qty 30

## 2022-12-24 MED ORDER — CHLORHEXIDINE GLUCONATE 0.12 % MT SOLN
15.0000 mL | Freq: Once | OROMUCOSAL | Status: DC
  Administered 2022-12-24: 15 mL via OROMUCOSAL

## 2022-12-24 MED ORDER — LIDOCAINE HCL (PF) 1 % IJ SOLN
INTRAMUSCULAR | Status: AC
  Filled 2022-12-24: qty 30

## 2022-12-24 MED ORDER — BUPIVACAINE HCL (PF) 0.5 % IJ SOLN
INTRAMUSCULAR | Status: DC | PRN
  Administered 2022-12-24: 5 mL

## 2022-12-24 MED ORDER — LIDOCAINE HCL (PF) 2 % IJ SOLN
INTRAMUSCULAR | Status: AC
  Filled 2022-12-24: qty 15

## 2022-12-24 MED ORDER — PROPOFOL 1000 MG/100ML IV EMUL
INTRAVENOUS | Status: AC
  Filled 2022-12-24: qty 100

## 2022-12-24 MED ORDER — LIDOCAINE HCL (PF) 1 % IJ SOLN
INTRAMUSCULAR | Status: DC | PRN
  Administered 2022-12-24: 5 mL

## 2022-12-24 MED ORDER — CEFAZOLIN SODIUM-DEXTROSE 2-4 GM/100ML-% IV SOLN: 2.0000 g | Freq: Once | INTRAVENOUS | Status: DC

## 2022-12-24 MED ORDER — FENTANYL CITRATE PF 50 MCG/ML IJ SOSY: 25.0000 ug | PREFILLED_SYRINGE | INTRAMUSCULAR | Status: DC | PRN

## 2022-12-24 MED ORDER — FENTANYL CITRATE (PF) 100 MCG/2ML IJ SOLN
INTRAMUSCULAR | Status: DC | PRN
  Administered 2022-12-24: 25 ug via INTRAVENOUS

## 2022-12-24 MED ORDER — FENTANYL CITRATE (PF) 100 MCG/2ML IJ SOLN
INTRAMUSCULAR | Status: AC
  Filled 2022-12-24: qty 2

## 2022-12-24 MED ORDER — DEXAMETHASONE SODIUM PHOSPHATE 10 MG/ML IJ SOLN
INTRAMUSCULAR | Status: AC
  Filled 2022-12-24: qty 3

## 2022-12-24 MED ORDER — ONDANSETRON HCL 4 MG/2ML IJ SOLN
INTRAMUSCULAR | Status: AC
  Filled 2022-12-24: qty 6

## 2022-12-24 MED ORDER — ORAL CARE MOUTH RINSE: 15.0000 mL | Freq: Once | OROMUCOSAL | Status: DC

## 2022-12-24 MED ORDER — 0.9 % SODIUM CHLORIDE (POUR BTL) OPTIME
TOPICAL | Status: DC | PRN
  Administered 2022-12-24: 1000 mL

## 2022-12-24 MED ORDER — LIDOCAINE HCL 2 % IJ SOLN
INTRAMUSCULAR | Status: AC
  Filled 2022-12-24: qty 20

## 2022-12-24 MED ORDER — PROPOFOL 500 MG/50ML IV EMUL
INTRAVENOUS | Status: DC | PRN
  Administered 2022-12-24: 100 ug/kg/min via INTRAVENOUS
  Administered 2022-12-24: 20 mg via INTRAVENOUS

## 2022-12-24 MED ORDER — ORAL CARE MOUTH RINSE: 15.0000 mL | Freq: Once | OROMUCOSAL | Status: AC

## 2022-12-24 SURGICAL SUPPLY — 75 items
APL PRP STRL LF DISP 70% ISPRP (MISCELLANEOUS) ×1
APL SKNCLS STERI-STRIP NONHPOA (GAUZE/BANDAGES/DRESSINGS)
BAG COUNTER SPONGE SURGICOUNT (BAG) ×2 IMPLANT
BAG SPNG CNTER NS LX DISP (BAG) ×1
BENZOIN TINCTURE PRP APPL 2/3 (GAUZE/BANDAGES/DRESSINGS) ×2 IMPLANT
BLADE AVERAGE 25X9 (BLADE) IMPLANT
BLADE SURG 15 STRL LF DISP TIS (BLADE) ×4 IMPLANT
BLADE SURG 15 STRL SS (BLADE) ×1
BNDG CMPR 75X21 PLY HI ABS (MISCELLANEOUS)
BNDG CMPR 82X61 PLY HI ABS (GAUZE/BANDAGES/DRESSINGS)
BNDG CMPR 9X4 STRL LF SNTH (GAUZE/BANDAGES/DRESSINGS) ×1
BNDG CMPR STD VLCR NS LF 5.8X4 (GAUZE/BANDAGES/DRESSINGS) ×1
BNDG CONFORM 6X.82 1P STRL (GAUZE/BANDAGES/DRESSINGS) ×2 IMPLANT
BNDG ELASTIC 3X5.8 VLCR STR LF (GAUZE/BANDAGES/DRESSINGS) ×2 IMPLANT
BNDG ELASTIC 4X5.8 VLCR NS LF (GAUZE/BANDAGES/DRESSINGS) IMPLANT
BNDG ELASTIC 4X5.8 VLCR STR LF (GAUZE/BANDAGES/DRESSINGS) ×4 IMPLANT
BNDG ESMARK 4X9 LF (GAUZE/BANDAGES/DRESSINGS) ×2 IMPLANT
BNDG GAUZE DERMACEA FLUFF 4 (GAUZE/BANDAGES/DRESSINGS) ×2 IMPLANT
BNDG GZE DERMACEA 4 6PLY (GAUZE/BANDAGES/DRESSINGS) ×1
BUR OVAL CARBIDE 4.0 (BURR) IMPLANT
CHLORAPREP W/TINT 26 (MISCELLANEOUS) ×2 IMPLANT
CLOTH BEACON ORANGE TIMEOUT ST (SAFETY) ×2 IMPLANT
COVER BACK TABLE 60X90IN (DRAPES) ×2 IMPLANT
COVER SURGICAL LIGHT HANDLE (MISCELLANEOUS) ×4 IMPLANT
CUFF TOURN SGL QUICK 18X4 (TOURNIQUET CUFF) ×2 IMPLANT
DRAPE 3/4 80X56 (DRAPES) ×2 IMPLANT
DRAPE EXTREMITY T 121X128X90 (DISPOSABLE) ×2 IMPLANT
DRAPE OEC MINIVIEW 54X84 (DRAPES) ×2 IMPLANT
DRSG ADAPTIC 3X8 NADH LF (GAUZE/BANDAGES/DRESSINGS) ×2 IMPLANT
DRSG EMULSION OIL 3X3 NADH (GAUZE/BANDAGES/DRESSINGS) ×2 IMPLANT
DURAPREP 26ML APPLICATOR (WOUND CARE) ×2 IMPLANT
ELECT REM PT RETURN 15FT ADLT (MISCELLANEOUS) ×2 IMPLANT
GAUZE 4X4 16PLY ~~LOC~~+RFID DBL (SPONGE) ×2 IMPLANT
GAUZE SPONGE 4X4 12PLY STRL (GAUZE/BANDAGES/DRESSINGS) ×2 IMPLANT
GAUZE STRETCH 2X75IN STRL (MISCELLANEOUS) ×2 IMPLANT
GLOVE BIO SURGEON STRL SZ7.5 (GLOVE) ×2 IMPLANT
GLOVE BIO SURGEON STRL SZ8 (GLOVE) ×2 IMPLANT
GLOVE BIOGEL PI IND STRL 8 (GLOVE) ×2 IMPLANT
GOWN STRL REUS W/ TWL LRG LVL3 (GOWN DISPOSABLE) ×2 IMPLANT
GOWN STRL REUS W/ TWL XL LVL3 (GOWN DISPOSABLE) ×2 IMPLANT
GOWN STRL REUS W/TWL LRG LVL3 (GOWN DISPOSABLE) ×1
GOWN STRL REUS W/TWL XL LVL3 (GOWN DISPOSABLE) ×1
K-WIRE SURGICAL 1.6X102 (WIRE) IMPLANT
KIT BASIN OR (CUSTOM PROCEDURE TRAY) ×2 IMPLANT
KIT TURNOVER KIT A (KITS) IMPLANT
MANIFOLD NEPTUNE II (INSTRUMENTS) ×2 IMPLANT
NDL HYPO 25X1 1.5 SAFETY (NEEDLE) ×6 IMPLANT
NDL SAFETY ECLIP 18X1.5 (MISCELLANEOUS) IMPLANT
NEEDLE HYPO 25X1 1.5 SAFETY (NEEDLE) ×1 IMPLANT
NS IRRIG 1000ML POUR BTL (IV SOLUTION) ×2 IMPLANT
PACK ORTHO EXTREMITY (CUSTOM PROCEDURE TRAY) ×2 IMPLANT
PAD ARMBOARD 7.5X6 YLW CONV (MISCELLANEOUS) ×2 IMPLANT
PADDING CAST ABS COTTON 4X4 ST (CAST SUPPLIES) ×2 IMPLANT
PENCIL SMOKE EVACUATOR (MISCELLANEOUS) IMPLANT
PIN CAPS ORTHO GREEN .062 (PIN) IMPLANT
SPIKE FLUID TRANSFER (MISCELLANEOUS) ×4 IMPLANT
STOCKINETTE 8 INCH (MISCELLANEOUS) ×2 IMPLANT
STRIP CLOSURE SKIN 1/2X4 (GAUZE/BANDAGES/DRESSINGS) ×2 IMPLANT
SUT MERSILENE 4 0 P 3 (SUTURE) IMPLANT
SUT MNCRL AB 3-0 PS2 18 (SUTURE) IMPLANT
SUT MNCRL AB 4-0 PS2 18 (SUTURE) IMPLANT
SUT MON AB 5-0 PS2 18 (SUTURE) IMPLANT
SUT PROLENE 3 0 PS 1 (SUTURE) IMPLANT
SUT PROLENE 4 0 PS 2 18 (SUTURE) IMPLANT
SUT VIC AB 2-0 CT2 27 (SUTURE) ×2 IMPLANT
SUT VIC AB 3-0 SH 27 (SUTURE) ×1
SUT VIC AB 3-0 SH 27X BRD (SUTURE) ×2 IMPLANT
SUT VICRYL AB 3-0 FS1 BRD 27IN (SUTURE) ×2 IMPLANT
SWAB COLLECTION DEVICE MRSA (MISCELLANEOUS) IMPLANT
SWAB CULTURE ESWAB REG 1ML (MISCELLANEOUS) IMPLANT
SYR 10ML LL (SYRINGE) IMPLANT
SYR BULB EAR ULCER 3OZ GRN STR (SYRINGE) ×2 IMPLANT
SYR CONTROL 10ML LL (SYRINGE) ×4 IMPLANT
UNDERPAD 30X36 HEAVY ABSORB (UNDERPADS AND DIAPERS) ×2 IMPLANT
YANKAUER SUCT BULB TIP 10FT TU (MISCELLANEOUS) ×2 IMPLANT

## 2022-12-24 NOTE — Brief Op Note (Signed)
12/24/2022  1:50 PM  PATIENT:  Jose Stone  77 y.o. male  PRE-OPERATIVE DIAGNOSIS:  Painful Hardware Skin ulcer of great toe Bone spur of toe of left foot  POST-OPERATIVE DIAGNOSIS:  Painful HardwareSkin ulcer of great toeBone spur of toe of left foot  PROCEDURE:  Procedure(s): HARDWARE REMOVAL (Left) EXOSTECTECTOMY TOE, ARTHROPLASTY OF IPJ (Left) BONE BIOPSY (Left)  SURGEON:  Surgeon(s) and Role:    * Camilia Caywood, Nena Kaelin Holford, DPM - Primary  PHYSICIAN ASSISTANT:   ASSISTANTS: none   ANESTHESIA:   local and MAC  EBL:  Minimal  BLOOD ADMINISTERED:none  DRAINS: none   LOCAL MEDICATIONS USED:  MARCAINE    and LIDOCAINE   SPECIMEN:  Source of Specimen:  Left halux bone for pathology and culture, screw for culture, ulcer for pathology  DISPOSITION OF SPECIMEN:  PATHOLOGY  COUNTS:  YES  TOURNIQUET:   Total Tourniquet Time Documented: Ankle (Left) - 56 minutes Total: Ankle (Left) - 56 minutes   DICTATION: .Note written in [place]: Discharge to home after PACU  PATIENT DISPOSITION:  PACU - hemodynamically stable.   Delay start of Pharmacological VTE agent (>24hrs) due to surgical blood loss or risk of bleeding: no

## 2022-12-24 NOTE — Anesthesia Postprocedure Evaluation (Signed)
Anesthesia Post Note  Patient: Jose Stone  Procedure(s) Performed: HARDWARE REMOVAL (Left) EXOSTECTECTOMY TOE, ARTHROPLASTY OF IPJ (Left) BONE BIOPSY (Left)     Patient location during evaluation: PACU Anesthesia Type: MAC Level of consciousness: awake and alert Pain management: pain level controlled Vital Signs Assessment: post-procedure vital signs reviewed and stable Respiratory status: spontaneous breathing, nonlabored ventilation and respiratory function stable Cardiovascular status: stable and blood pressure returned to baseline Postop Assessment: no apparent nausea or vomiting Anesthetic complications: no  No notable events documented.  Last Vitals:  Vitals:   12/24/22 1430 12/24/22 1454  BP: (!) 152/74 (!) 166/96  Pulse: (!) 57 (!) 57  Resp: 16 14  Temp:    SpO2: 98% 99%    Last Pain:  Vitals:   12/24/22 1454  TempSrc:   PainSc: 0-No pain                 Carnella Fryman,W. EDMOND

## 2022-12-24 NOTE — Op Note (Signed)
Full Operative Report  Date of Operation: 5:59 PM, 12/24/2022   Patient: Jose Stone - 77 y.o. male  Surgeon: Yevonne Pax, DPM   Assistant: None  Diagnosis: 1. Infected hardware, left hallux 2. Ulcer of left hallux to level of subcutaneous fat tissue 3. Bone spur of toe of left foot  Procedure:  1. Removal of deep retained screw hallux, left foot 2. Bone biopsy from distal phalanx of hallux, left foot 3. Condylectomy of the medial and lateral condyles of hallux proximal and distal phalanx, left foot 5. Partial phalangectomy of proximal phalanx hallux, left foot    Anesthesia: Monitor Anesthesia Care  No responsible provider has been recorded for the case.  Anesthesiologist: Roderic Palau, MD CRNA: Victoriano Lain, CRNA; Key, Kristopher, CRNA   Estimated Blood Loss: Minimal   Hemostasis: 1) Anatomical dissection, mechanical compression, electrocautery 2) Ankle tourniquet inflated to 250 mm Hg, 55 min  Implants: 2 x 0.062 k wires  Materials: prolene  Injectables: 1) Pre-operatively: 10 cc of 1:1 mix 1% lidocaine plain with 0.5% marcaine plain 2) Post-operatively: None  Specimens: Left hallux distal phalanx for pathology, left hallux bone for culture, retained screw for pathology, ulcer left hallux for pathology   Antibiotics: 2 gm Ancef Pre op  Drains: None  Complications: Patient tolerated the procedure well without complication.   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 Pt has had this for years. 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 Wound has not gotten better after IPJ fusion attempt. He is now seeking revision surgery to try and help heal the wound. Concern for possible osteomyelitis vs  infected nonunion, vs abnormal pressure form medial osseous spurs causing wound. The patient has failed conservative treatments of various modalities. At this time the patient has elected to proceed with surgical correction. All alternatives, risks, and complications of the procedures were thoroughly explained to the patient. He is aware of possible need for further surgery in the future should the wound fail to heal following this procedure. Patient exhibits appropriate understanding of all discussion points and informed consent was signed and obtained in the chart with no guarantees to surgical outcome given or implied.  Description of Procedure: Patient was brought to the operating room and placed on the operative table in the supine  position. Patient was secured to the table with safety belt, a contralateral SCD was placed, and all bony prominences were well padded. A surgical timeout was performed and all members of the operating room, the procedure, and the surgical site were identified. Mac with local anesthesia occurred. Local anesthetic as previously described was then injected about the operative field in a local infiltrative block.   A well padded pneumatic tourniquet was placed to the operative limb. The left lower extremity was then prepped and draped in the usual sterile manner. The pneumatic tourniquet was inflated. The following procedure then began.  Attention was directed to the left foot were the previous surgical incision was noted over the hallux IPJ. Using the previous incision, a #15 blade was used to incise through skin. As the screw head was underlying the nail plate, and incision would need to be extended to this area, decision was made to remove the left hallux nail in total. Incision was carried  into the left hallux nail bed to distal phalanx bone. This was deepened through subcutaneous tissues with sharp and blunt dissection and care was taken to protect all vital structures.  Scar tissue was noted throughout. A combination of sharp and dull dissection was used to free periosteal and fibrotic tissues from the bone. As the screw head was buried, a sagittal saw was then used to make a transverse osteotomy just distal to the screw head. The screw head was then visible and able to be grasped with hemostat. It was then removed and noted to be loose removed easily. Screw was sent for pathology. Fluoroscopy was used to verify complete removal. The area was then flushed with copious amounts of sterile saline.   Next bone biopsy was performed.  A sagittal saw was used to resect the medial aspect of the hallux distal phalanx where the spurs were prominent and communicating with the plantar medial wound on the left hallux.  The fragment of bone that was removed was freed from the soft tissues with Valora Corporal and 15 blade and then passed to the back table for pathology.  A separate piece of bone was sent for culture from the same area.  This bone was noted to be slightly devascularized but not obviously necrotic or soft.  Next further condylectomy of the proximal and distal phalanx around the IPJ was performed.  A sagittal saw as well as a high-speed bur were used to resect prominent bone on the medial and plantar aspect of the hallux distal phalanx and head of the proximal phalanx to remove any sharp edges.  Next a hallux IPJ arthroplasty was completed.  A sagittal saw was used to resect a portion of the head of the proximal phalanx at the osteotomy site.  A rectangular piece of bone was removed from the head of the proximal phalanx so as to allow some motion at the IP former IPJ arthrodesis site.  Next two 0.062 K wires were placed in crossing fashion across the osteotomy and the distal phalanx so as to fixate that osteotomy and allow healing of the bone at that location.  Fluoroscopy was used to verify correct position of the wires and there is noted to be no gap at the osteotomy site following  placement of these wires.  The wires would not advance across the hallux IPJ so as to allow some motion of the area.  The area was thoroughly irrigated with sterile saline.  Then using the suture materials previously described, the site was closed in anatomic layers and the skin was well approximated under minimal tension.  The surgical site was then dressed with betadine adaptic 4x4 kerlix and ace wrap. . The tourniquet was deflated after 55 minutes with immediate vascular return noted to the operative foot. The patient tolerated both the procedure and anesthesia well with vital signs stable throughout. The patient was transferred from the OR to recovery under the discretion of anesthesia.  Condition: Patient was taken to PACU in good condition and all vital signs stable and neurovascular status intact to the operative limb.  Discharge: Patient will be discharged home with all postop instructions and medications.   Jordie Schreur, Jose Michaelle Bottomley, DPM will follow the patient throughout the entire post-operative course and the patient is aware of all post-operative protocols in place. The patient will follow the protocol of rest, ice, and elevation. The patient will be weightbearing to heel in a post op shoe to the operative limb until further instructed. The dressing is to  remain clean, dry, and intact.   Ames Coupe, DPM

## 2022-12-24 NOTE — Discharge Instructions (Signed)
Foot & Ankle Surgery Patient Discharge Instructions:   Arrange to have an adult drive you home after surgery. If you had general anesthesia, it may take a day or more to fully recover. So, for at least the next 24 hours: Do not drive or use machinery or power tools; do not drink alcohol; and do not make any major decisions.   Bandages: Keep your dressings clean, dry, and intact. Do not remove or change. When bathing/showering, cover the bandage or cast with a plastic bag to keep it dry (still keep the area away from the water) and use a chair, do not stand. Don't remove your bandage until your doctor tells you to. If your bandage gets wet or dirty, check with your doctor. You can likely replace it with a clean, dry one.  Activity: Weightbearing to heel in post op shoe to left foot Sit or lie down when possible. Put a pillow under your heel to raise your foot above the level of your heart. Place ice bag or pack behind knee on surgical side for 20 minutes every hour that you are awake for the first 24-48 hours. You can drive again when instructed by your doctor. Wear your surgical shoe at all times unless told otherwise by your health care provider. Use crutches or a cane as directed. Follow your doctor's instructions about putting weight on your foot.  Diet: Start with liquids and light foods (such as dry toast, bananas, and applesauce). As you feel up to it, slowly return to your normal diet. Drink at least six to eight glasses of water or other nonalcoholic fluids a day. To avoid nausea, eat before taking narcotic pain medications.  Medications: Take all medications as instructed. Take pain medications on time. Do not wait until the pain is bad before taking your medications. Avoid alcohol while on pain medications.  Call your doctor if: Continuous bleeding through dressings. If your dressings get wet. Fevers over 100.4 degrees Fahrenheit (38 degrees Celsius). Chest pains or  difficulty breathing.

## 2022-12-24 NOTE — Progress Notes (Signed)
Orthopedic Tech Progress Note Patient Details:  Jose Stone 1946/02/02 [id number]  [name], male   DOB: [birth year], 77 y.o.   MRN: [id number]  Jose Stone 12/24/2022, 2:28 PM Left post op shoe applied in pacu

## 2022-12-24 NOTE — Transfer of Care (Signed)
Immediate Anesthesia Transfer of Care Note  Patient: Jose Stone  Procedure(s) Performed: HARDWARE REMOVAL (Left) EXOSTECTECTOMY TOE, ARTHROPLASTY OF IPJ (Left) BONE BIOPSY (Left)  Patient Location: PACU  Anesthesia Type:MAC  Level of Consciousness: awake, alert , oriented, and patient cooperative  Airway & Oxygen Therapy: Patient Spontanous Breathing and Patient connected to face mask oxygen  Post-op Assessment: Report given to RN, Post -op Vital signs reviewed and stable, and Patient moving all extremities  Post vital signs: Reviewed and stable  Last Vitals:  Vitals Value Taken Time  BP 118/56 12/24/22 1354  Temp    Pulse 66 12/24/22 1356  Resp 13 12/24/22 1356  SpO2 100 % 12/24/22 1356  Vitals shown include unvalidated device data.  Last Pain:  Vitals:   12/24/22 1105  TempSrc: Oral  PainSc:          Complications: No notable events documented.

## 2022-12-24 NOTE — H&P (Signed)
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  The various methods of treatment have been discussed with the patient and family. After consideration of risks, benefits and other options for treatment, the patient has consented to   Procedure(s): HARDWARE REMOVAL (Left) EXOSTECTECTOMY TOE (Left) BONE BIOPSY (Left) as a surgical intervention.  The patient's history has been reviewed, patient examined, no change in status, stable for surgery.  I have reviewed the patient's chart and labs.  Questions were answered to the patient's satisfaction.     Jose Stone

## 2022-12-24 NOTE — Anesthesia Preprocedure Evaluation (Addendum)
Anesthesia Evaluation  Patient identified by MRN, date of birth, ID band Patient awake    Reviewed: Allergy & Precautions, H&P , NPO status , Patient's Chart, lab work & pertinent test results  Airway Mallampati: II  TM Distance: >3 FB Neck ROM: Full    Dental no notable dental hx. (+) Teeth Intact, Dental Advisory Given   Pulmonary sleep apnea    Pulmonary exam normal breath sounds clear to auscultation       Cardiovascular hypertension, Pt. on medications + CAD and + Cardiac Stents  + dysrhythmias Atrial Fibrillation  Rhythm:Regular Rate:Normal     Neuro/Psych negative neurological ROS  negative psych ROS   GI/Hepatic Neg liver ROS,GERD  Medicated,,  Endo/Other  negative endocrine ROS    Renal/GU negative Renal ROS  negative genitourinary   Musculoskeletal  (+) Arthritis , Osteoarthritis,    Abdominal   Peds  Hematology  (+) Blood dyscrasia, anemia   Anesthesia Other Findings   Reproductive/Obstetrics negative OB ROS                             Anesthesia Physical Anesthesia Plan  ASA: 3  Anesthesia Plan: MAC   Post-op Pain Management: Minimal or no pain anticipated and Ofirmev IV (intra-op)*   Induction: Intravenous  PONV Risk Score and Plan: 1 and Propofol infusion and Ondansetron  Airway Management Planned: Natural Airway and Simple Face Mask  Additional Equipment:   Intra-op Plan:   Post-operative Plan:   Informed Consent: I have reviewed the patients History and Physical, chart, labs and discussed the procedure including the risks, benefits and alternatives for the proposed anesthesia with the patient or authorized representative who has indicated his/her understanding and acceptance.     Dental advisory given  Plan Discussed with: CRNA  Anesthesia Plan Comments:        Anesthesia Quick Evaluation

## 2022-12-25 ENCOUNTER — Encounter (HOSPITAL_COMMUNITY): Payer: Self-pay | Admitting: Podiatry

## 2022-12-29 LAB — AEROBIC/ANAEROBIC CULTURE W GRAM STAIN (SURGICAL/DEEP WOUND): Gram Stain: NONE SEEN

## 2023-01-01 ENCOUNTER — Ambulatory Visit (INDEPENDENT_AMBULATORY_CARE_PROVIDER_SITE_OTHER): Payer: Medicare Other

## 2023-01-01 ENCOUNTER — Ambulatory Visit (INDEPENDENT_AMBULATORY_CARE_PROVIDER_SITE_OTHER): Payer: Medicare Other | Admitting: Podiatry

## 2023-01-01 DIAGNOSIS — Z9889 Other specified postprocedural states: Secondary | ICD-10-CM | POA: Diagnosis not present

## 2023-01-01 DIAGNOSIS — M25775 Osteophyte, left foot: Secondary | ICD-10-CM

## 2023-01-01 NOTE — Progress Notes (Signed)
Subjective:  [name], male    DOB: 1946-02-07,  MRN: [id number]  Chief Complaint  Patient presents with   Routine Post Op    Patient states very little pain, He states that he has been sleeping ok. No f/c/n/v    DOS: 12/24/22 Procedure: Removal of deep retained screw hallux left foot, bone biopsy distal phalanx of hallux left foot, condylectomy of the medial lateral condyles of the hallux proximal distal phalanx, partial phalangectomy of proximal phalanx  77 y.o. male returns for post-op check.  Patient reports he has been doing well since surgery.  He has had minimal pain.  He has not needed much of the Percocet he stopped taking it since he was not having pain.  He has kept the dressing clean dry and intact there was some bleeding that occurred which seeped through the Ace wrap.  He is kept it intact since surgery has been walking minimally in a postoperative shoe with weight to the heel only as instructed.  Denies any nausea vomiting fever or chills.  Review of Systems: Negative except as noted in the HPI. Denies N/V/F/Ch.   Objective:  There were no vitals filed for this visit. There is no height or weight on file to calculate BMI. Constitutional Well developed. Well nourished.  Vascular Foot warm and well perfused. Capillary refill normal to all digits.  Calf is soft and supple, no posterior calf or knee pain, negative Homans' sign  Neurologic Normal speech. Oriented to person, place, and time. Epicritic sensation to light touch grossly present bilaterally.  Dermatologic Dorsal hallux with some bruising but the incision is well coapted with no dehiscence no maceration no erythema of the left hallux.  The wound on the medial plantar aspect of the hallux IPJ is very much improved with near complete healing there is a small eschar present the site of prior wound however it is not large and appears much better than preoperatively.  Edema is noted of the hallux.   Orthopedic: Edema of the left hallux.  2 K wires are intact and protruding through the end of the hallux.   Multiple view plain film radiographs: 01/01/2023 XR 3 views AP lateral oblique of the left foot.  Attention directed to the left hallux there is noted to be osteotomy through the midportion of the distal phalanx with K wires crossing across the osteotomy with no displacement no gapping alignment is well-maintained.  There is noted to be arthroplasty with partial phalangectomy of the distal aspect of the proximal phalanx.  Additionally there has been condylectomy of the medial dorsal plantar proximal phalanx and distal phalanx about the IPJ.  Decreased osseous spurring on the medial aspect of the hallux IPJ.  No fracture or osseous erosions identified Assessment:   1. Post-operative state    Plan:  Patient was evaluated and treated and all questions answered.  S/p foot surgery left hallux hardware removal bone biopsy condylectomy and partial phalangectomy -Progressing as expected post-operatively. -Encouraged that the medial plantar ulceration at the left hallux is healing since the surgery.  This is encouraging. -Discussed the pathology report with the patient I will follow-up with the pathologist to determine if there was evidence of osteomyelitis versus just osteonecrosis as they documented -Currently I do not recommend further antibiotics therapy after what he got postoperatively as there is no evidence of infection at this time and the surgical site is healing -XR: As above no acute osseous complication -WB Status: Weightbearing as tolerated in postop  shoe -Sutures: To remain intact until next visit. -Medications: Tylenol as needed for pain control -Foot redressed with Betadine wet-to-dry dressing.  Patient should leave this on and intact until next visit -I again discussed that we will continue to monitor for healing of both the osteotomy and the phalanx as well as the wound.   Hopefully if there is no further wound healing complications and the dorsal incision heals well nothing else will need to be done.  However if the pathologist does confirm the  suspicion for osteomyelitis we will have to discuss hallux amputation if the wounds do not heal or he has problems down the road.  Return in about 1 week (around 01/08/2023).         Everitt Amber, DPM [facility]

## 2023-01-05 LAB — SURGICAL PATHOLOGY

## 2023-01-08 ENCOUNTER — Ambulatory Visit (INDEPENDENT_AMBULATORY_CARE_PROVIDER_SITE_OTHER): Payer: Medicare Other | Admitting: Podiatry

## 2023-01-08 DIAGNOSIS — Z9889 Other specified postprocedural states: Secondary | ICD-10-CM

## 2023-01-08 DIAGNOSIS — L97522 Non-pressure chronic ulcer of other part of left foot with fat layer exposed: Secondary | ICD-10-CM

## 2023-01-08 DIAGNOSIS — M7752 Other enthesopathy of left foot: Secondary | ICD-10-CM

## 2023-01-08 DIAGNOSIS — M9689 Other intraoperative and postprocedural complications and disorders of the musculoskeletal system: Secondary | ICD-10-CM

## 2023-01-08 NOTE — Progress Notes (Signed)
  Subjective:  [name], male    DOB: 09/09/1946,  MRN: [id number]  Chief Complaint  Patient presents with   Routine Post Op    POV #2 DOS 12/24/22 -- LEFT HALLUX BONE SPUR RESECTION, BONE BIOPSY, POSSIBLE HARDWARE REMOVAL    DOS: 12/24/22 Procedure: Removal of deep retained screw hallux left foot, bone biopsy distal phalanx of hallux left foot, condylectomy of the medial lateral condyles of the hallux proximal distal phalanx, partial phalangectomy of proximal phalanx  77 y.o. male returns for second post-op check.  Patient reports he has been doing well since last appointment he denies pain.  Dressing has remained intact since last appointment.  Walking in postoperative shoe.  Denies any nausea vomiting fever or chills.  Review of Systems: Negative except as noted in the HPI. Denies N/V/F/Ch.   Objective:  There were no vitals filed for this visit. There is no height or weight on file to calculate BMI. Constitutional Well developed. Well nourished.  Vascular Foot warm and well perfused. Capillary refill normal to all digits.  Calf is soft and supple, no posterior calf or knee pain, negative Homans' sign  Neurologic Normal speech. Oriented to person, place, and time. Epicritic sensation to light touch grossly present bilaterally.  Dermatologic Dorsal hallux with decreased edema and ecchymosis.  THe incision is well coapted with no dehiscence no maceration no erythema of the left hallux.  The wound on the medial plantar aspect of the hallux IPJ is fully healed with some mild eschar that was debrided lightly today to demonstrate healthy underlying pink epithelium.  Orthopedic: Edema of the left hallux.  2 K wires are intact and protruding through the end of the hallux.   Multiple view plain film radiographs: Deferred at this visit Assessment:   1. Post-operative state   2. Skin ulcer of great toe with fat layer exposed, left ([place])   3. Bone spur of toe of left foot    4. Nonunion of osteotomy site     Plan:  Patient was evaluated and treated and all questions answered.  S/p foot surgery left hallux hardware removal bone biopsy condylectomy and partial phalangectomy -Progressing as expected post-operatively.  Continuing to improve the wound on the medial aspect of the hallux has fully healed at this time -Reviewed the pathology report with the pathologist as well as the patient.  Addendum stated there was no osteomyelitis noted on exam of the bone that was resected from the medial aspect of the hallux IPJ -Continue to monitor off antibiotics -WB Status: Weightbearing as tolerated in postop shoe -Sutures: To remain intact until next visit. -Medications: Tylenol as needed for pain control -Foot redressed with Betadine wet-to-dry dressing.  Patient should leave this on and intact until next visit -I again discussed that we will continue to monitor for healing of both the osteotomy and the phalanx as well as the wound, currently appears to be improving and healing well  Return in about 1 week (around 01/15/2023) for Third POV left hallux surgery.         Everitt Amber, DPM [facility]

## 2023-01-15 ENCOUNTER — Ambulatory Visit (INDEPENDENT_AMBULATORY_CARE_PROVIDER_SITE_OTHER): Payer: Medicare Other | Admitting: Podiatry

## 2023-01-15 DIAGNOSIS — M7752 Other enthesopathy of left foot: Secondary | ICD-10-CM

## 2023-01-15 DIAGNOSIS — Z9889 Other specified postprocedural states: Secondary | ICD-10-CM

## 2023-01-15 DIAGNOSIS — L97522 Non-pressure chronic ulcer of other part of left foot with fat layer exposed: Secondary | ICD-10-CM

## 2023-01-15 NOTE — Progress Notes (Signed)
  Subjective:  [name], male    DOB: 1946/03/16,  MRN: [id number]  Chief Complaint  Patient presents with   Routine Post Op    POV 3.  Patient states that he doing ok.    DOS: 12/24/22 Procedure: Removal of deep retained screw hallux left foot, bone biopsy distal phalanx of hallux left foot, condylectomy of the medial lateral condyles of the hallux proximal distal phalanx, partial phalangectomy of proximal phalanx  77 y.o. male returns for second post-op check.  Patient reports he has been doing well since last appointment he denies pain.  Dressing has remained intact since last appointment.  Walking in postoperative shoe.  Denies any nausea vomiting fever or chills.  Review of Systems: Negative except as noted in the HPI. Denies N/V/F/Ch.   Objective:  There were no vitals filed for this visit. There is no height or weight on file to calculate BMI. Constitutional Well developed. Well nourished.  Vascular Foot warm and well perfused. Capillary refill normal to all digits.  Calf is soft and supple, no posterior calf or knee pain, negative Homans' sign  Neurologic Normal speech. Oriented to person, place, and time. Epicritic sensation to light touch grossly present bilaterally.  Dermatologic Dorsal hallux with decreased edema and ecchymosis.  THe incision is well coapted with no dehiscence no maceration no erythema of the left hallux.  The wound on the medial plantar aspect of the hallux IPJ is fully healed without evidence of recurrence of eschar.  The dorsal incision is healing well drying up no evidence of erythema or drainage.  Orthopedic: Edema of the left hallux somewhat improved.  2 K wires are intact and protruding through the end of the hallux.   Multiple view plain film radiographs: 01/15/2023 XR 3 views left foot AP lateral oblique weightbearing.  Findings: Attention directed left hallux there is no to be surgical hardware intact clearly 2 K wires crossing and  osteotomy in cross fashion at the midportion of the distal phalanx.  There is no evidence of osseous infill or bridging across the osteotomy site at this time.  However there is no significant displacement noted.  Previous arthroplasty at the hallux interphalangeal joint with resection of bone at the medial aspect of the joint. Assessment:   1. Post-operative state   2. Skin ulcer of great toe with fat layer exposed, left ([place])   3. Bone spur of toe of left foot     Plan:  Patient was evaluated and treated and all questions answered.  S/p foot surgery left hallux hardware removal bone biopsy condylectomy and partial phalangectomy -Continuing to improve postoperatively.  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 -Continue to monitor off antibiotics -WB Status: Weightbearing as tolerated in postop shoe -Sutures: Removed in total this visit -Medications: None needed at this time -Foot redressed with Betadine wet-to-dry dressing.  Patient should begin every third day Betadine dressing changes to the left hallux -I again discussed that we will continue to monitor for healing of both the osteotomy and the phalanx as well as the wound, currently appears to be improving and healing well  No follow-ups on file.         Everitt Amber, DPM [facility] / [facility]

## 2023-01-20 DIAGNOSIS — L905 Scar conditions and fibrosis of skin: Secondary | ICD-10-CM | POA: Diagnosis not present

## 2023-01-20 DIAGNOSIS — L821 Other seborrheic keratosis: Secondary | ICD-10-CM | POA: Diagnosis not present

## 2023-01-20 DIAGNOSIS — D225 Melanocytic nevi of trunk: Secondary | ICD-10-CM | POA: Diagnosis not present

## 2023-01-20 DIAGNOSIS — D2271 Melanocytic nevi of right lower limb, including hip: Secondary | ICD-10-CM | POA: Diagnosis not present

## 2023-01-20 DIAGNOSIS — D1801 Hemangioma of skin and subcutaneous tissue: Secondary | ICD-10-CM | POA: Diagnosis not present

## 2023-01-20 DIAGNOSIS — Z85820 Personal history of malignant melanoma of skin: Secondary | ICD-10-CM | POA: Diagnosis not present

## 2023-01-20 DIAGNOSIS — D2272 Melanocytic nevi of left lower limb, including hip: Secondary | ICD-10-CM | POA: Diagnosis not present

## 2023-01-20 DIAGNOSIS — L918 Other hypertrophic disorders of the skin: Secondary | ICD-10-CM | POA: Diagnosis not present

## 2023-01-29 ENCOUNTER — Ambulatory Visit (INDEPENDENT_AMBULATORY_CARE_PROVIDER_SITE_OTHER): Payer: Medicare Other | Admitting: Podiatry

## 2023-01-29 ENCOUNTER — Ambulatory Visit (INDEPENDENT_AMBULATORY_CARE_PROVIDER_SITE_OTHER): Payer: Medicare Other

## 2023-01-29 ENCOUNTER — Encounter: Payer: Self-pay | Admitting: Podiatry

## 2023-01-29 ENCOUNTER — Other Ambulatory Visit: Payer: Self-pay | Admitting: Podiatry

## 2023-01-29 DIAGNOSIS — Z9889 Other specified postprocedural states: Secondary | ICD-10-CM

## 2023-01-29 DIAGNOSIS — M7752 Other enthesopathy of left foot: Secondary | ICD-10-CM

## 2023-01-29 DIAGNOSIS — M9689 Other intraoperative and postprocedural complications and disorders of the musculoskeletal system: Secondary | ICD-10-CM

## 2023-01-29 NOTE — Progress Notes (Signed)
Subjective:  [name], male    DOB: [birth year],  MRN: [id number]  Chief Complaint  Patient presents with   Routine Post Op    DOS 12/24/22 -- LEFT HALLUX BONE SPUR RESECTION, BONE BIOPSY, POSSIBLE HARDWARE REMOVAL   "It seems to be doing okay"    DOS: 12/24/22 Procedure: Removal of deep retained screw hallux left foot, bone biopsy distal phalanx of hallux left foot, condylectomy of the medial lateral condyles of the hallux proximal distal phalanx, partial phalangectomy of proximal phalanx  77 y.o. male returns for postop check.  Patient reports he has been doing well since last visit.  He has been putting Betadine on the top of the toe and redressing with gauze and Coban wrap on a daily basis.  Denies much drainage.  Denies any pain in the left great toe.  Has not seen return of the wound or callus on the outside of the toe that he was previously having trouble with.  K wires are still intact.  Review of Systems: Negative except as noted in the HPI. Denies N/V/F/Ch.   Objective:  There were no vitals filed for this visit. There is no height or weight on file to calculate BMI. Constitutional Well developed. Well nourished.  Vascular Foot warm and well perfused. Capillary refill normal to all digits.  Calf is soft and supple, no posterior calf or knee pain, negative Homans' sign  Neurologic Normal speech. Oriented to person, place, and time. Epicritic sensation to light touch grossly present bilaterally.  Dermatologic Dorsal hallux with decreased edema and ecchymosis.  THe incision is healing well and improved from prior with decreased maceration however there is a central aspect of superficial ulceration after debridement of some eschar present.  The wound on the medial plantar aspect of the hallux IPJ is fully healed without evidence of recurrence of eschar.    Orthopedic: Edema of the left hallux improved.  2 K wires are intact and protruding through the end of the  hallux.   Multiple view plain film radiographs: 01/29/23 XR 3 views left foot AP lateral oblique weightbearing.  Findings: Attention directed left hallux there is no to be surgical hardware intact clearly 2 K wires crossing and osteotomy in cross fashion at the midportion of the distal phalanx.  There is no evidence of osseous infill or bridging across the osteotomy site at this time.  However there is no significant displacement noted.  Previous arthroplasty at the hallux interphalangeal joint with resection of bone at the medial aspect of the joint. Assessment:   1. Post-operative state   2. Bone spur of toe of left foot   3. Status post left foot surgery   4. Nonunion of osteotomy site     Plan:  Patient was evaluated and treated and all questions answered.  S/p foot surgery left hallux hardware removal bone biopsy condylectomy and partial phalangectomy -Continuing to improve postoperatively.  Wound continues to remain healed on the medial aspect of the left hallux -Unfortunately do not feel that the osteotomy of the distal phalanx is likely to heal.  Therefore decision was made to remove the K wires at this visit -After prep with Betadine and needle driver was used to remove the K wires from the first distal phalanx without issue. -As patient has neuropathy does not have any pain in the toe and I believe he will continue to heal the osteotomy site versus it being an asymptomatic nonunion -Continue to monitor off antibiotics -WB Status:  Weightbearing as tolerated in postop shoe -Foot redressed with Betadine wet-to-dry dressing for the dorsal incision.  Patient should continue every third day Betadine dressing changes to the left hallux  Return in about 5 weeks (around 03/05/2023).         Everitt Amber, DPM [facility] / [facility]

## 2023-02-05 ENCOUNTER — Ambulatory Visit: Payer: Medicare Other | Admitting: Podiatry

## 2023-02-09 ENCOUNTER — Other Ambulatory Visit: Payer: Self-pay | Admitting: Family Medicine

## 2023-02-09 ENCOUNTER — Other Ambulatory Visit: Payer: Self-pay | Admitting: Internal Medicine

## 2023-02-09 DIAGNOSIS — M1A9XX0 Chronic gout, unspecified, without tophus (tophi): Secondary | ICD-10-CM | POA: Diagnosis not present

## 2023-02-09 DIAGNOSIS — Z8546 Personal history of malignant neoplasm of prostate: Secondary | ICD-10-CM | POA: Diagnosis not present

## 2023-02-09 DIAGNOSIS — Z6831 Body mass index (BMI) 31.0-31.9, adult: Secondary | ICD-10-CM | POA: Diagnosis not present

## 2023-02-09 DIAGNOSIS — I10 Essential (primary) hypertension: Secondary | ICD-10-CM | POA: Diagnosis not present

## 2023-02-09 DIAGNOSIS — M810 Age-related osteoporosis without current pathological fracture: Secondary | ICD-10-CM | POA: Diagnosis not present

## 2023-02-09 DIAGNOSIS — N529 Male erectile dysfunction, unspecified: Secondary | ICD-10-CM | POA: Diagnosis not present

## 2023-02-09 DIAGNOSIS — K219 Gastro-esophageal reflux disease without esophagitis: Secondary | ICD-10-CM | POA: Diagnosis not present

## 2023-02-09 DIAGNOSIS — R7303 Prediabetes: Secondary | ICD-10-CM | POA: Diagnosis not present

## 2023-02-10 ENCOUNTER — Encounter: Payer: Self-pay | Admitting: Podiatry

## 2023-02-11 ENCOUNTER — Other Ambulatory Visit (INDEPENDENT_AMBULATORY_CARE_PROVIDER_SITE_OTHER): Payer: Medicare Other | Admitting: Podiatry

## 2023-02-11 DIAGNOSIS — Z9889 Other specified postprocedural states: Secondary | ICD-10-CM

## 2023-02-11 MED ORDER — DOXYCYCLINE HYCLATE 100 MG PO TABS: 100.0000 mg | ORAL_TABLET | Freq: Two times a day (BID) | ORAL | 0 refills | Status: AC

## 2023-02-11 NOTE — Progress Notes (Signed)
Abx sent

## 2023-02-16 ENCOUNTER — Telehealth: Payer: Self-pay | Admitting: *Deleted

## 2023-02-16 NOTE — Telephone Encounter (Signed)
Spoke with patient and he did receive antibiotic,was given recommendations per physician if foot  gets worse, said that it seems to be getting better.

## 2023-02-17 ENCOUNTER — Ambulatory Visit
Admission: RE | Admit: 2023-02-17 | Discharge: 2023-02-17 | Disposition: A | Discharge status: Home / Self Care | Payer: Medicare Other | Source: Ambulatory Visit | Attending: Internal Medicine | Admitting: Internal Medicine

## 2023-02-17 DIAGNOSIS — I77810 Thoracic aortic ectasia: Secondary | ICD-10-CM | POA: Diagnosis not present

## 2023-02-17 MED ORDER — IOPAMIDOL (ISOVUE-370) INJECTION 76%
75.0000 mL | Freq: Once | INTRAVENOUS | Status: AC | PRN
  Administered 2023-02-17: 75 mL via INTRAVENOUS

## 2023-03-03 DIAGNOSIS — H35372 Puckering of macula, left eye: Secondary | ICD-10-CM | POA: Diagnosis not present

## 2023-03-03 DIAGNOSIS — H2513 Age-related nuclear cataract, bilateral: Secondary | ICD-10-CM | POA: Diagnosis not present

## 2023-03-04 ENCOUNTER — Encounter (HOSPITAL_BASED_OUTPATIENT_CLINIC_OR_DEPARTMENT_OTHER): Payer: Self-pay

## 2023-03-04 ENCOUNTER — Emergency Department (HOSPITAL_BASED_OUTPATIENT_CLINIC_OR_DEPARTMENT_OTHER)
Admission: EM | Admit: 2023-03-04 | Discharge: 2023-03-04 | Disposition: A | Discharge status: Home / Self Care | Payer: Medicare Other | Attending: Emergency Medicine | Admitting: Emergency Medicine

## 2023-03-04 ENCOUNTER — Telehealth: Payer: Self-pay | Admitting: Internal Medicine

## 2023-03-04 ENCOUNTER — Other Ambulatory Visit: Payer: Self-pay

## 2023-03-04 ENCOUNTER — Emergency Department (HOSPITAL_BASED_OUTPATIENT_CLINIC_OR_DEPARTMENT_OTHER): Payer: Medicare Other | Admitting: Radiology

## 2023-03-04 DIAGNOSIS — R002 Palpitations: Secondary | ICD-10-CM

## 2023-03-04 DIAGNOSIS — I2510 Atherosclerotic heart disease of native coronary artery without angina pectoris: Secondary | ICD-10-CM | POA: Insufficient documentation

## 2023-03-04 DIAGNOSIS — Z85828 Personal history of other malignant neoplasm of skin: Secondary | ICD-10-CM | POA: Diagnosis not present

## 2023-03-04 DIAGNOSIS — R079 Chest pain, unspecified: Secondary | ICD-10-CM | POA: Diagnosis not present

## 2023-03-04 DIAGNOSIS — I10 Essential (primary) hypertension: Secondary | ICD-10-CM | POA: Insufficient documentation

## 2023-03-04 LAB — CBC
HCT: 44.4 % (ref 39.0–52.0)
Hemoglobin: 15.1 g/dL (ref 13.0–17.0)
MCH: 29.8 pg (ref 26.0–34.0)
MCHC: 34 g/dL (ref 30.0–36.0)
MCV: 87.6 fL (ref 80.0–100.0)
Platelets: 189 10*3/uL (ref 150–400)
RBC: 5.07 MIL/uL (ref 4.22–5.81)
RDW: 14.5 % (ref 11.5–15.5)
WBC: 6.4 10*3/uL (ref 4.0–10.5)
nRBC: 0 % (ref 0.0–0.2)

## 2023-03-04 LAB — TSH: TSH: 5.64 u[IU]/mL — ABNORMAL HIGH (ref 0.350–4.500)

## 2023-03-04 LAB — BASIC METABOLIC PANEL
Anion gap: 9 (ref 5–15)
BUN: 28 mg/dL — ABNORMAL HIGH (ref 8–23)
CO2: 24 mmol/L (ref 22–32)
Calcium: 9.2 mg/dL (ref 8.9–10.3)
Chloride: 106 mmol/L (ref 98–111)
Creatinine, Ser: 0.91 mg/dL (ref 0.61–1.24)
GFR, Estimated: >60 mL/min (ref >60)
Glucose, Bld: 110 mg/dL — ABNORMAL HIGH (ref 70–99)
Potassium: 4.1 mmol/L (ref 3.5–5.1)
Sodium: 139 mmol/L (ref 135–145)

## 2023-03-04 LAB — MAGNESIUM: Magnesium: 1.9 mg/dL (ref 1.7–2.4)

## 2023-03-04 MED ORDER — SODIUM CHLORIDE 0.9 % IV BOLUS
500.0000 mL | Freq: Once | INTRAVENOUS | Status: AC
  Administered 2023-03-04: 500 mL via INTRAVENOUS

## 2023-03-04 NOTE — ED Notes (Signed)
Discharge instructions provided by edp were reinforced to pt with s/o at bedside. Pt verbalized understanding with no additional questions at this time.

## 2023-03-04 NOTE — ED Triage Notes (Addendum)
Patient here POV from Home.  Endorses Tingling/Palpitations in Chest 4 Hours ago. No N/V. No SOB. No CP.  History of A. Fib. Ablation 2.5 Years ago. Just takes Xarelto.   NAD Noted during Triage. A&Ox4. GCS 15. Ambulatory.

## 2023-03-04 NOTE — Discharge Instructions (Signed)
You were seen in the emerged from today with heart palpitations.  Your lab work here is reassuring.  You had some frequent PVCs when he first arrived which may be causing your symptoms.  You also to the feeling of A-fib which could have been occurring although your heart is currently in a normal rhythm.  Please follow closely with your cardiologist as well as your primary care doctor.  Return with any new or suddenly worsening symptoms.

## 2023-03-04 NOTE — Telephone Encounter (Signed)
Patient c/o Palpitations:  High priority if patient c/o lightheadedness, shortness of breath, or chest pain  How long have you had palpitations/irregular HR/ Afib? Are you having the symptoms now?  Patient's wife assumes the patient went into afib around 2:15 PM this afternoon  Are you currently experiencing lightheadedness, SOB or CP?  No   Do you have a history of afib (atrial fibrillation) or irregular heart rhythm?  Yes   Have you checked your BP or HR? (document readings if available):  86/60   Are you experiencing any other symptoms? Weakness, sweating (not currently), no CP/SOB, O2 93

## 2023-03-04 NOTE — ED Provider Notes (Signed)
Emergency Department Provider Note   I have reviewed the triage vital signs and the nursing notes.   HISTORY  Chief Complaint Palpitations   HPI Jose Stone is a 77 y.o. male with PHM of a fib s/p ablation presents to the ED with palpitations earlier today which reminded him of a fib in the past. No CP. No near syncope. He had been working outside and suspects some dehydration but upon arrival is feeling well. He is not on rate control medication since his ablation. Notes occasional feeling of PVCs but earlier symptoms felt different.     Past Medical History:  Diagnosis Date   Anemia    Aortic root enlargement (HCC)    Atrial fibrillation with RVR (HCC) 07/13/2018   CAD (coronary artery disease)    Complication of anesthesia    Hypotensive with general anesth.   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 Positive palpitations.  Respiratory: Denies shortness of breath. Gastrointestinal: No abdominal pain. Musculoskeletal: Negative for back pain. Skin: Negative for rash. Neurological: Negative for headaches.  ____________________________________________   PHYSICAL EXAM:  VITAL SIGNS: ED Triage Vitals  Enc Vitals Group     BP 03/04/23 1817 (!) 108/91     Pulse Rate 03/04/23 1817 74     Resp 03/04/23 1817 18     Temp 03/04/23 1817 98.4 F (36.9 C)     Temp Source 03/04/23 1817 Oral     SpO2 03/04/23 1817 96 %     Weight 03/04/23 1815 230 lb (104.3 kg)     Height  03/04/23 1815 6' 1.5" (1.867 m)   Constitutional: Alert and oriented. Well appearing and in no acute distress. Eyes: Conjunctivae are normal.  Head: Atraumatic. Nose: No congestion/rhinnorhea. Mouth/Throat: Mucous membranes are moist.   Neck: No stridor.   Cardiovascular: Normal rate, regular rhythm. Good peripheral circulation. Grossly normal heart sounds.   Respiratory: Normal respiratory effort.  No retractions. Lungs CTAB. Gastrointestinal: Soft and nontender. No distention.  Musculoskeletal: No lower extremity tenderness nor edema. No gross deformities of extremities. Neurologic:  Normal speech and language. No gross focal neurologic deficits are appreciated.  Skin:  Skin is warm, dry and intact. No rash noted.  ____________________________________________   LABS (all labs ordered are listed, but only abnormal results are displayed)  Labs Reviewed  BASIC METABOLIC PANEL - Abnormal; Notable for the following components:      Result Value   Glucose, Bld 110 (*)    BUN 28 (*)    All other components within normal limits  TSH - Abnormal; Notable for the following components:   TSH 5.640 (*)    All other components within normal limits  CBC  MAGNESIUM   ____________________________________________  EKG   EKG Interpretation  Date/Time:  Wednesday Mar 04 2023 18:22:21 EDT Ventricular Rate:  77 PR Interval:  154 QRS Duration: 106 QT Interval:  362 QTC Calculation: 409  R Axis:   -36 Text Interpretation: Sinus rhythm with frequent Premature ventricular complexes Left axis deviation Incomplete right bundle branch block Abnormal ECG When compared with ECG of 24-Sep-2022 15:15, PREVIOUS ECG IS PRESENT Confirmed by Alona Bene [phone]) on 03/04/2023 6:25:37 PM        ____________________________________________  RADIOLOGY  DG Chest 2 View  Result Date: 03/04/2023 CLINICAL DATA:  Chest pain. EXAM: CHEST - 2 VIEW COMPARISON:  Chest radiograph dated 09/25/2022. FINDINGS: No  focal consolidation, pleural effusion or pneumothorax. Stable cardiac silhouette. No acute osseous pathology. IMPRESSION: No active cardiopulmonary disease. Electronically Signed   By: Elgie Collard M.D.   On: 03/04/2023 18:48    ____________________________________________   PROCEDURES  Procedure(s) performed:   Procedures  None ____________________________________________   INITIAL IMPRESSION / ASSESSMENT AND PLAN / ED COURSE  Pertinent labs & imaging results that were available during my care of the patient were reviewed by me and considered in my medical decision making (see chart for details).   This patient is Presenting for Evaluation of palpitations, which does require a range of treatment options, and is a complaint that involves a high risk of morbidity and mortality.  The Differential Diagnoses include A fib, frequent PVCs, NSVT, AVNRT, etc.  Critical Interventions-    Medications  sodium chloride 0.9 % bolus 500 mL (0 mLs Intravenous Stopped 03/04/23 2016)    Reassessment after intervention: Frequent PVC resolved on telemetry.    I did obtain Additional Historical Information from wife at bedside who is a Engineer, civil (consulting). Notes his HR at home well above 100 to the 130 range.    Clinical Laboratory Tests Ordered, included K and Mg WNL. No AKI. No severe anemia. TSH similar to prior values.   Radiologic Tests Ordered, included CXR. I independently interpreted the images and agree with radiology interpretation.   Cardiac Monitor Tracing which shows NSR. PVCs on arrival have largely resolved.    Social Determinants of Health Risk patient denies smoking history.   Medical Decision Making: Summary:  Patient presents to the ED w/ AFIB sensation. Arrives in NSR with frequent PVCs. Patient declines symptoms at the time of arrival EKG. Plan for electrolytes and IVF. No CP or other anginal equivalents to prompt ACS or PE evaluation.   Reevaluation with update and discussion  with patient and wife. Labs are reassuring. Telemetry reassuring. No breakthrough A fib noted. Advised continued Cardiology follow up.   Considered admission but telemetry and labs are reassuring.   Patient's presentation is most consistent with acute presentation with potential threat to life or bodily function.   Disposition: discharge  ____________________________________________  FINAL CLINICAL IMPRESSION(S) / ED DIAGNOSES  Final diagnoses:  Palpitations     Note:  This document was prepared using Dragon voice recognition software and may include unintentional dictation errors.  Alona Bene, MD, [facility] Emergency Medicine    [place] Fant, Arlyss Repress, MD 03/05/23 [id number]

## 2023-03-04 NOTE — Telephone Encounter (Signed)
Patient wife states she is a coronary care nurse. She then proceeds with husband issues.  He has been in A-fib since 2:15. She states he had diaphorisis for 10 minutes and BP at 80/60.  He has since hydrated and his BP is now 130/60.  No SOB, No chest pain, no sweating. Apical HR is 120 per wife.  She states he has had 5 episodes in 8 yrs and last episode yrs ago lasted 25 minutes and resolved.  He had an ablation in 2021, is on Xarelto ,no other medications for episodes of AFIB. Advised to go to ED in order to possible convert back to NSR.  She wants to ask which ED preferred, advised closest to her . She states understanding.  She will cal tomorrow to follow up

## 2023-03-05 ENCOUNTER — Ambulatory Visit (INDEPENDENT_AMBULATORY_CARE_PROVIDER_SITE_OTHER): Payer: Medicare Other

## 2023-03-05 ENCOUNTER — Ambulatory Visit (INDEPENDENT_AMBULATORY_CARE_PROVIDER_SITE_OTHER): Payer: Medicare Other | Admitting: Podiatry

## 2023-03-05 DIAGNOSIS — M7752 Other enthesopathy of left foot: Secondary | ICD-10-CM

## 2023-03-05 DIAGNOSIS — Z9889 Other specified postprocedural states: Secondary | ICD-10-CM

## 2023-03-05 NOTE — Progress Notes (Signed)
  Subjective:  [name], male    DOB: 05-14-1946,  MRN: [id number]  Chief Complaint  Patient presents with   Routine Post Op    Post op    DOS: 12/24/22 Procedure: Removal of deep retained screw hallux left foot, bone biopsy distal phalanx of hallux left foot, condylectomy of the medial lateral condyles of the hallux proximal distal phalanx, partial phalangectomy of proximal phalanx  77 y.o. male returns for postop check.  Patient reports he has been doing well since last visit.  He has been putting Betadine on the top of the toe and redressing with gauze and Coban wrap on a daily basis.  States that overall the wound is improving.  He has had no recurrence of callus or ulceration present at the plantar medial aspect of the left hallux much improved from prior.  Patient is back to walking in regular shoes.  Review of Systems: Negative except as noted in the HPI. Denies N/V/F/Ch.   Objective:  There were no vitals filed for this visit. There is no height or weight on file to calculate BMI. Constitutional Well developed. Well nourished.  Vascular Foot warm and well perfused. Capillary refill normal to all digits.  Calf is soft and supple, no posterior calf or knee pain, negative Homans' sign  Neurologic Normal speech. Oriented to person, place, and time. Epicritic sensation to light touch grossly present bilaterally.  Dermatologic Dorsal hallux with decreased edema and ecchymosis.  THe incision is healing well and improved from prior with eschar present centrally.  There is no callus or wound formation at the plantar medial aspect of the left hallux   Orthopedic: Edema of the left hallux decreased   Multiple view plain film radiographs: 01/29/23 XR 3 views left foot AP lateral oblique weightbearing.  Findings: Attention directed left hallux there is no to be surgical hardware intact clearly 2 K wires crossing and osteotomy in cross fashion at the midportion of the distal  phalanx.  There is no evidence of osseous infill or bridging across the osteotomy site at this time.  However there is no significant displacement noted.  Previous arthroplasty at the hallux interphalangeal joint with resection of bone at the medial aspect of the joint. Assessment:   1. Post-operative state   2. Bone spur of toe of left foot   3. Status post left foot surgery      Plan:  Patient was evaluated and treated and all questions answered.  S/p foot surgery left hallux hardware removal bone biopsy condylectomy and partial phalangectomy -Continuing to improve postoperatively.  Wound continues to remain healed on the medial aspect of the left hallux -Dorsal left hallux improving with decreased eschar and wound is improving/healing -Continue to monitor off antibiotics -WB Status: Weightbearing as tolerated in regular shoes -Foot redressed with Betadine ointment and Band-Aid for the dorsal left hallux eschar -Overall patient is very satisfied with the result he does not have any recurrence of ulceration or callus at the plantar medial aspect of the left hallux which she has been dealing with for many years prior to the surgery.  He is very happy with the results at this time we will continue to monitor  Return in about 6 weeks (around 04/16/2023) for POV L hallux surgery.         Corinna Gab, DPM [facility] / [facility]

## 2023-03-17 DIAGNOSIS — C61 Malignant neoplasm of prostate: Secondary | ICD-10-CM | POA: Diagnosis not present

## 2023-03-19 ENCOUNTER — Ambulatory Visit (INDEPENDENT_AMBULATORY_CARE_PROVIDER_SITE_OTHER): Payer: Medicare Other | Admitting: Podiatry

## 2023-03-19 DIAGNOSIS — M7752 Other enthesopathy of left foot: Secondary | ICD-10-CM | POA: Diagnosis not present

## 2023-03-19 DIAGNOSIS — Z9889 Other specified postprocedural states: Secondary | ICD-10-CM

## 2023-03-19 DIAGNOSIS — L97521 Non-pressure chronic ulcer of other part of left foot limited to breakdown of skin: Secondary | ICD-10-CM | POA: Diagnosis not present

## 2023-03-19 MED ORDER — DOXYCYCLINE HYCLATE 100 MG PO TABS: 100.0000 mg | ORAL_TABLET | Freq: Two times a day (BID) | ORAL | 0 refills | Status: AC

## 2023-03-19 NOTE — Progress Notes (Signed)
Subjective:  [name], male    DOB: October 07, 1946,  MRN: [id number]  Chief Complaint  Patient presents with   Foot Problem    Blisters in between toes on left foot. Appeared on Tuesday morning. Denies any pain or itchiness. Clear drainage noticed by patient.     77 y.o. male presents with concern for blisters present on the toes of the left foot.  He said this started Tuesday morning and he noticed some drainage.  He is not sure what causes it thinks it could have been from wearing dress shoes possibly over the weekend versus an allergic type reaction but he is not sure to what.  It came up out of nowhere.  Past Medical History:  Diagnosis Date   Anemia    Aortic root enlargement (HCC)    Atrial fibrillation with RVR (HCC) 07/13/2018   CAD (coronary artery disease)    Complication of anesthesia    Hypotensive with general anesth.   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    Ventricular septal defect    VSD (ventricular septal defect)     Allergies  Allergen Reactions   Ciprofloxacin Other (See Comments)    Aggravates Bulging aorta   Codeine Nausea Only and Other (See Comments)    Can tolerate with benadryl   Diltiazem Cd [Diltiazem Hcl] Other (See Comments)    Bradycardia   Mupirocin Itching   Bacitra-Neomycin-Polymyxin-Hc Rash   Hydrocodone Rash and Other (See Comments)    Can tolerate with benadryl   Neosporin [Bacitracin-Polymyxin B] Rash   Neosporin [Neomycin-Bacitracin Zn-Polymyx] Rash   Oxycodone Rash and Other (See Comments)    Can tolerate with benadryl   Tramadol Hcl Rash    ROS: Negative except as per  HPI above  Objective:  General: AAO x3, NAD  Dermatological: 3 small areas of blistering with surrounding erythema present on the left foot 1 at the base of the third interdigital sulcus 1 at the base of the fourth interdigital sulcus and 1 on the lateral aspect of the left fourth toe.  There is mild loose skin overlying but no deep wound no significant drainage and no streaking or spreading erythema.  Limited to breakdown of skin.  The left hallux is healing well with no issues at this time no erythema or breakdown.  Vascular:  Dorsalis Pedis artery and Posterior Tibial artery pedal pulses are 2/4 bilateral.  Capillary fill time < 3 sec to all digits.   Neruologic: Grossly absent to light touch bilateral  Musculoskeletal: Edema noted of the left hallux however no open wound present  Gait: Unassisted, Nonantalgic.     Radiographs:  Deferred at this visit Assessment:   1. Ulcer of left foot, limited to breakdown of skin (HCC)   2. Post-operative state   3. Status post left foot surgery   4. Bone spur of toe of left foot     Plan:  Patient was evaluated and treated and all questions answered.  # 3 small areas of superficial ulceration limited to breakdown of skin at the  toes of the left foot -Discussed with patient I am not sure what caused these ulcers deform they are irregular in appearance and distribution.  Possibly pressure versus allergic reaction to something -Recommend Betadine applied daily and gauze and wrap around the third toe and placed in between the third and fourth and the fourth and fifth toes to dry out the area. -Continue to monitor for worsening of the ulcerations or more of them to form.  If this occurs I will have him come back in -E Rx for doxycycline 100 mg twice daily for 10 days as prophylaxis against infection  # Postoperative to the left hallux after bone spur resection debridement and removal of hardware -He is continue to heal his left great toe  surgical site very well there is no issues at the site at this time and the dorsal wound is fully healed and there is no recurrence of the plantar medial ulceration of the bottom of the left great toe -Patient will follow-up as previously scheduled for follow-up on the left hallux in approximately 1 month        Corinna Gab, DPM [facility] / [facility]

## 2023-04-16 ENCOUNTER — Ambulatory Visit (INDEPENDENT_AMBULATORY_CARE_PROVIDER_SITE_OTHER): Payer: Medicare Other | Admitting: Podiatry

## 2023-04-16 DIAGNOSIS — L97521 Non-pressure chronic ulcer of other part of left foot limited to breakdown of skin: Secondary | ICD-10-CM

## 2023-04-16 DIAGNOSIS — M7752 Other enthesopathy of left foot: Secondary | ICD-10-CM | POA: Diagnosis not present

## 2023-04-16 DIAGNOSIS — Z9889 Other specified postprocedural states: Secondary | ICD-10-CM

## 2023-04-16 NOTE — Progress Notes (Signed)
Subjective:  [name], male    DOB: 02-Dec-1945,  MRN: [id number]  Chief Complaint  Patient presents with   Foot Ulcer    Rm 20 1 month follow up left ulcer check. Pt states he is doing very well. The scab came of the medial side of his left hallux but he states everything is fine.     77 y.o. male presents for follow-up of left foot ulcerations x 3 locations.  Also following up postoperatively status post left hallux exostectomy and removal of hardware.  He is doing very well.  That surgery was December 24, 2022.  He states he is very satisfied with the result of the surgery as he no longer has any swelling redness or callus/wound present on the plantar medial aspect of the left hallux much improved from preoperatively.  Past Medical History:  Diagnosis Date   Anemia    Aortic root enlargement (HCC)    Atrial fibrillation with RVR (HCC) 07/13/2018   CAD (coronary artery disease)    Complication of anesthesia    Hypotensive with general anesth.   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    Ventricular septal defect    VSD (ventricular septal defect)     Allergies  Allergen Reactions   Ciprofloxacin Other (See Comments)    Aggravates Bulging aorta   Codeine Nausea Only and Other (See Comments)    Can tolerate with benadryl   Diltiazem Cd [Diltiazem Hcl] Other (See Comments)    Bradycardia   Mupirocin Itching   Bacitra-Neomycin-Polymyxin-Hc Rash   Hydrocodone Rash and Other (See Comments)    Can tolerate with benadryl   Neosporin [Bacitracin-Polymyxin B] Rash   Neosporin [Neomycin-Bacitracin Zn-Polymyx] Rash   Oxycodone  Rash and Other (See Comments)    Can tolerate with benadryl   Tramadol Hcl Rash    ROS: Negative except as per HPI above  Objective:  General: AAO x3, NAD  Dermatological: The 3 areas of small ulceration are improved with no evidence of recurrence or open skin or open wounds at this time on the left lateral dorsal foot.  There is no callus buildup at the plantar medial aspect of the left hallux IPJ.  Decreased edema and erythema from prior of the left hallux.  The dorsal incision is fully healed at this time with some scar tissue and the nailbed is healthy and clean and dry.   Vascular:  Dorsalis Pedis artery and Posterior Tibial artery pedal pulses are 2/4 bilateral.  Capillary fill time < 3 sec to all digits.   Neruologic: Grossly absent to light touch bilateral  Musculoskeletal: Edema noted of the left hallux however no open wound present  Gait: Unassisted, Nonantalgic.    Radiographs:  Deferred at this visit Assessment:   1. Post-operative state   2. Status post left foot surgery   3. Bone spur of toe of left foot   4. Ulcer of left foot, limited to breakdown of skin [facility])      Plan:  Patient was evaluated and treated and all questions answered.  #  3 small areas of superficial ulceration limited to breakdown of skin at the toes of the left foot -Fully resolved after Betadine therapy as well as doxycycline -Monitor for recurrence  # Postoperative to the left hallux after bone spur resection debridement and removal of hardware -He is continuing to heal his left great toe surgical site very well there is no issues at the site at this time and the dorsal wound is fully healed and there is no recurrence of the plantar medial ulceration of the bottom of the left great toe -Continue to monitor for long-term changes to the hallux however this point he appears to be fully resolved and improved postoperatively with no concern per the patient he is happy with the surgical outcome  as he no longer has a callus or wound on the plantar medial aspect of the  left hallux IPJ        Corinna Gab, DPM [facility] / [facility]

## 2023-04-22 ENCOUNTER — Other Ambulatory Visit: Payer: Self-pay | Admitting: Internal Medicine

## 2023-04-22 DIAGNOSIS — I480 Paroxysmal atrial fibrillation: Secondary | ICD-10-CM

## 2023-04-22 NOTE — Telephone Encounter (Signed)
Xarelto 20mg  refill request received. Pt is 77 years old, weight-104.3kg, Crea-0.91 on 03/04/23, last seen by Dr. Tresa Endo on 10/22/22, Diagnosis-Afib/flutter, CrCl-103.47 mL/min; Dose is appropriate based on dosing criteria. Will send in refill to requested pharmacy.

## 2023-04-29 ENCOUNTER — Encounter: Payer: Self-pay | Admitting: Cardiovascular Disease

## 2023-04-29 ENCOUNTER — Ambulatory Visit: Payer: Medicare Other | Attending: Cardiovascular Disease | Admitting: Cardiovascular Disease

## 2023-04-29 DIAGNOSIS — I480 Paroxysmal atrial fibrillation: Secondary | ICD-10-CM | POA: Insufficient documentation

## 2023-04-29 DIAGNOSIS — G4733 Obstructive sleep apnea (adult) (pediatric): Secondary | ICD-10-CM | POA: Diagnosis not present

## 2023-04-29 DIAGNOSIS — Q210 Ventricular septal defect: Secondary | ICD-10-CM | POA: Diagnosis not present

## 2023-04-29 DIAGNOSIS — I10 Essential (primary) hypertension: Secondary | ICD-10-CM | POA: Diagnosis not present

## 2023-04-29 DIAGNOSIS — Z9889 Other specified postprocedural states: Secondary | ICD-10-CM | POA: Insufficient documentation

## 2023-04-29 DIAGNOSIS — C61 Malignant neoplasm of prostate: Secondary | ICD-10-CM | POA: Diagnosis not present

## 2023-04-29 DIAGNOSIS — I2510 Atherosclerotic heart disease of native coronary artery without angina pectoris: Secondary | ICD-10-CM | POA: Diagnosis not present

## 2023-04-29 DIAGNOSIS — Z8679 Personal history of other diseases of the circulatory system: Secondary | ICD-10-CM | POA: Insufficient documentation

## 2023-04-29 NOTE — Patient Instructions (Addendum)
Medication Instructions:  NO CHANGES  *If you need a refill on your cardiac medications before your next appointment, please call your pharmacy*  Follow-Up: At [facility], you and your health needs are our priority.  As part of our continuing mission to provide you with exceptional heart care, we have created designated Provider Care Teams.  These Care Teams include your primary Cardiologist (physician) and Advanced Practice Providers (APPs -  Physician Assistants and Nurse Practitioners) who all work together to provide you with the care you need, when you need it.  We recommend signing up for the patient portal called "MyChart".  Sign up information is provided on this After Visit Summary.  MyChart is used to connect with patients for Virtual Visits (Telemedicine).  Patients are able to view lab/test results, encounter notes, upcoming appointments, etc.  Non-urgent messages can be sent to your provider as well.   To learn more about what you can do with MyChart, go to [email or web address].    Your next appointment:    April/May 2025 with Dr. Tresa Endo   Other Instructions  Diastasis Recti / Ventral Hernia

## 2023-04-29 NOTE — Progress Notes (Signed)
Cardiology Office Note    Date:  04/30/2023   ID:  Jose Stone, DOB 17-Jul-1946, MRN [id number]  PCP:  Sigmund Hazel, MD  Cardiologist:  Nicki Guadalajara, MD (sleep); Dr. Rennis Golden  6 month F/U sleep evaluation  History of Present Illness:  Jose Stone is a 77 y.o. male who presents to the office for a 72-month follow-up sleep evaluation.  Jose Stone is followed by Dr. Rennis Golden for his primary cardiology care.  He has a history of previous documentation of sleep apnea over 10 years ago and apparently was on CPAP therapy for some time.  In August 2017, he underwent a reassessment of his sleep apnea with a sleep study and apparently on this study he was found to have increased upper airway resistance (U ARS) with an AHI of 2.5/h overall; however, there was moderate sleep apnea with REM sleep with an AHI of 26.7/h.  There was mild oxygen desaturation to a nadir of 86% and he snored with moderate snoring volume.  He also had increased periodic limb movement of sleep.  Apparently, because of his low AHI, he was not approved to resume CPAP therapy.  He is followed by Dr. Rennis Golden and has a history of a small VSD for which she has undergone periodic echocardiographic assessment.  A CT scan showed an ectatic root measuring 3.8 cm.  He also had a history of atrial fibrillation with RVR with spontaneously converted and has a history of hypertension and dyslipidemia.  He was referred for repeat sleep study due to worsening snoring witnessed by his wife.  His sleep study was done on April 29, 2019 and during that evaluation he met split-night criteria.  Other diagnostic portion of the study, AHI was 7.7 and his RDI was 14.5/h.  CPAP was implemented and was titrated up to an optimal pressure at 12 cm of water.  He had loud snoring volume during the diagnostic portion of the study.  Oxygen saturation nadir was 90%.  Jose Stone was set up with CPAP on June 23, 2019 with [facility] as his DME company.  A  download was obtained in the office  from August 22, 2019 through September 20, 2019 which confirms 100% usage.  He is averaging 6 hours and 17 minutes of CPAP use per night.  He states he typically goes to bed around 11 PM and oftentimes wakes up between 7 or 7:30 AM.  At a 12 cm water pressure, AHI is 5.0.  There are some nights with no leak but some nights with mild leak.  He has recently been trying the new ResMed F 30i mask which he believes is very comfortable and his leak is improved.  He states that at the beginning of the night, he does not feel like he is getting enough pressure.  He feels improved since reinitiating CPAP therapy with resolution of prior snoring.  He denies any daytime sleepiness.  An Epworth Sleepiness Scale was calculated in the office today and endorsed at 6 as shown below:  Epworth Sleepiness Scale: Situation   Chance of Dozing/Sleeping (0 = never , 1 = slight chance , 2 = moderate chance , 3 = high chance )   sitting and reading 1   watching TV 2   sitting inactive in a public place 0   being a passenger in a motor vehicle for an hour or more 0   lying down in the afternoon 3   sitting  and talking to someone 0   sitting quietly after lunch (no alcohol) 0   while stopped for a few minutes in traffic as the driver 0   Total Score  6   He was unaware of any bruxism, he denies painful restless legs, he denies any hypnagogic hallucinations or cataplectic events.  At his September 23, 2019 compliance evaluation he met compliance but in the early portion of the night he did not feel that he was getting adequate pressure.  At that time I discontinued his ramp time which was set at 45 minutes and changed him from a set pressure to an auto mode with a range of 10 to potentially 20 cm of water.  He was tolerating the new ResMed F 30i mask.  I saw him on October 18, 2020.  Over the prior year he had undergone rotator cuff surgery on his left shoulder and also had  atrial  fibrillation/atrial flutter ablation in September 2021 by Dr. Johney Frame.  He has continued to use CPAP with excellent compliance.  A new download from November 15 through October 16, 2020 shows 100% use with average use at 7 hours and 33 minutes.  His 95th percentile pressure is 12 with a maximum average pressure of 13.2.  AHI is excellent at 1.6.  He does not have any mask leak.  He is unaware of breakthrough snoring, bruxism, restless legs.  He denies daytime sleepiness.   I last saw him on October 22, 2022.  Since his prior evaluation he had undergone stenting to his left circumflex coronary artery in May 2022.  He also has had issues with an infected left great toe currently on Augmentin.  He has had difficulty walking as result of his left toe infection.  He also was diagnosed with prostate CA and is to start radiation therapy at [facility].  Around Thanksgiving, he had developed a pneumothorax with partial collapse of his lung and had significant coughing and allergies.  He required supplemental O2.  As result, recent compliance with CPAP was suboptimal until December 6 when usage became more frequent.  His most recent download from November 23 to October 21, 2022 shows average use at 6 hours and 27 minutes usage was only 12 of 30 days.  His pressure is set at a 10 to 20 cm range and AHI is 4.9 with his 95th percentile pressure at 14.8 and maximum average pressure at 16.3.  During that evaluation I again discussed optimal sleep duration at 7 to 9 hours.   Since I last saw him, he had an ER evaluation for palpitations on Mar 04, 2023 and ECG showed PVCs.  He has continued to use CPAP therapy and is in need for new supplies.  Apparently [facility] is no longer in the CPAP business.  His wife uses [employer] for her DME company and he will need to be switched per his preference to [employer].  A download was obtained from May 28 through April 29, 2023.  Usage was 93% of days and 1 night he essentially stayed up and  did not go to sleep watching television.  He typically goes to bed between 11 PM and midnight and wakes up between 630 and 7 AM.  Average CPAP use is 5 hours and 36 minutes.  His device is set at a pressure range of 10 to 20 cm.  AHI is 3.7.  He is in need of a new mask.  He presents for evaluation.   Past Medical  History:  Diagnosis Date   Anemia    Aortic root enlargement (HCC)    Atrial fibrillation with RVR (HCC) 07/13/2018   CAD (coronary artery disease)    Complication of anesthesia    Hypotensive with general anesth.   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    Ventricular septal defect    VSD (ventricular septal defect)     Past Surgical History:  Procedure Laterality Date   ATRIAL FIBRILLATION ABLATION N/A 07/20/2020   Procedure: ATRIAL FIBRILLATION ABLATION;  Surgeon: Hillis Range, MD;  Location: [facility];  Service: Cardiovascular;  Laterality: N/A;   BONE BIOPSY Left 12/24/2022   Procedure: BONE BIOPSY;  Surgeon: Pilar Plate, DPM;  Location: [facility] ORS;  Service: Podiatry;  Laterality: Left;   CARDIAC CATHETERIZATION     CARDIOVERSION N/A 02/29/2020   Procedure: CARDIOVERSION;  Surgeon: Chrystie Nose, MD;  Location: [facility] ENDOSCOPY;  Service: Cardiovascular;  Laterality: N/A;   CARDIOVERSION N/A 06/28/2020   Procedure: CARDIOVERSION;  Surgeon: Vesta Mixer, MD;  Location: [facility] ENDOSCOPY;  Service: Cardiovascular;  Laterality: N/A;   CORONARY STENT INTERVENTION N/A 03/18/2021   Procedure: CORONARY STENT INTERVENTION;  Surgeon: Kathleene Hazel, MD;  Location: [facility];  Service: Cardiovascular;   Laterality: N/A;   DISTAL INTERPHALANGEAL JOINT FUSION Left 01/19/2019   Procedure: HALLUX INTERPHANGEAL JOINT FUSION LEFT FOOT;  Surgeon: Vivi Barrack, DPM;  Location: [facility] OR;  Service: Podiatry;  Laterality: Left;  LEFT BLOCK   EXOSTECTECTOMY TOE Left 12/24/2022   Procedure: EXOSTECTECTOMY TOE, ARTHROPLASTY OF IPJ;  Surgeon: Pilar Plate, DPM;  Location: [facility];  Service: Podiatry;  Laterality: Left;   HARDWARE REMOVAL Left 12/24/2022   Procedure: HARDWARE REMOVAL;  Surgeon: Pilar Plate, DPM;  Location: [facility];  Service: Podiatry;  Laterality: Left;   hipospedious     LEFT HEART CATH AND CORONARY ANGIOGRAPHY N/A 03/18/2021   Procedure: LEFT HEART CATH AND CORONARY ANGIOGRAPHY;  Surgeon: Kathleene Hazel, MD;  Location: [facility];  Service: Cardiovascular;  Laterality: N/A;   LEFT HEART CATHETERIZATION WITH CORONARY ANGIOGRAM N/A 08/14/2014   Procedure: LEFT HEART CATHETERIZATION WITH CORONARY ANGIOGRAM;  Surgeon: Ricki Rodriguez, MD;  Location: [facility];  Service: Cardiovascular;  Laterality: N/A;   MASS EXCISION Left 01/19/2019   Procedure: EXCISION BENIGN LESION 2.OCM LEFT FOOT;  Surgeon: Vivi Barrack, DPM;  [facility];  Service: Podiatry;  Laterality: Left;   SHOULDER ARTHROSCOPY WITH SUBACROMIAL DECOMPRESSION Left 05/03/2020   Procedure: SHOULDER ARTHROSCOPY DEBRIDEMENT PARTIAL ROTATOR CUFF REPAIR, SUBACROMIAL DECOMPRESSION, BICEPS TENOTOMY;  Surgeon: Jones Broom, MD;  Location: [facility];  Service: Orthopedics;  Laterality: Left;   SHOULDER SURGERY     TONSILLECTOMY  1955    Current Medications: Outpatient Medications Prior to Visit  Medication Sig Dispense Refill   acetaminophen (TYLENOL) 500 MG tablet Take 500-1,000 mg by mouth every 6 (six) hours as needed for mild pain or headache.     albuterol (VENTOLIN HFA) 108 (90 Base) MCG/ACT inhaler Inhale 2 puffs into the lungs  every 6 (six) hours as needed for wheezing or shortness of  breath.     alendronate (FOSAMAX) 70 MG tablet Take 70 mg by mouth every Wednesday. Take with a full glass of water on an empty stomach.     allopurinol (ZYLOPRIM) 100 MG tablet Take 200 mg by mouth in the morning.  0   atorvastatin (LIPITOR) 80 MG tablet TAKE 1 TABLET BY MOUTH DAILY AT 6 PM (Patient taking differently: Take 80 mg by mouth every evening.) 90 tablet 3   ferrous sulfate 325 (65 FE) MG tablet Take 325 mg by mouth every evening.     ketotifen (ZADITOR) 0.025 % ophthalmic solution Place 1 drop into both eyes 2 (two) times daily.     Light Mineral Oil-Mineral Oil (RETAINE MGD) 0.5-0.5 % EMUL Place 1 drop into both eyes in the morning, at noon, and at bedtime.     loratadine (CLARITIN) 10 MG tablet Take 10 mg by mouth daily as needed for allergies.     Multiple Vitamins-Minerals (CENTRUM SILVER 50+MEN) TABS Take 1 tablet by mouth daily with breakfast.     Omega-3 Fatty Acids (OMEGA 3 PO) Take 3 capsules by mouth daily.     pantoprazole (PROTONIX) 40 MG tablet TAKE 1 TABLET BY MOUTH EVERY DAY (Patient taking differently: Take 40 mg by mouth daily before breakfast.) 90 tablet 4   tamsulosin (FLOMAX) 0.4 MG CAPS capsule Take 0.4 mg by mouth.  2 times daily     valsartan (DIOVAN) 160 MG tablet TAKE 1 TABLET BY MOUTH EVERY DAY 90 tablet 2   vitamin B-12 (CYANOCOBALAMIN) 500 MCG tablet Take 500 mcg by mouth every evening.     XARELTO 20 MG TABS tablet TAKE 1 TABLET BY MOUTH DAILY WITH SUPPER 90 tablet 1   COLCRYS 0.6 MG tablet Take 0.6 mg by mouth daily as needed (FOR GOUT FLARES). (Patient not taking: Reported on 04/29/2023)     nitroGLYCERIN (NITROSTAT) 0.4 MG SL tablet PLACE 1 TAB UNDER TONGUE EVERY 5 MINS AS NEEDED FOR CHEST PAIN,MAX 3 DOSES (Patient not taking: Reported on 04/29/2023) 25 tablet 5   ondansetron (ZOFRAN) 4 MG tablet Take 1 tablet (4 mg total) by mouth every 8 (eight) hours as needed for nausea or vomiting. (Patient not taking: Reported on 04/29/2023) 20 tablet 0   sildenafil  (REVATIO) 20 MG tablet Take 20 mg by mouth as needed for erectile dysfunction. (Patient not taking: Reported on 04/29/2023)     No facility-administered medications prior to visit.     Allergies:   Ciprofloxacin, Codeine, Diltiazem cd [diltiazem hcl], Mupirocin, Bacitra-neomycin-polymyxin-hc, Hydrocodone, Neosporin [bacitracin-polymyxin b], Neosporin [neomycin-bacitracin zn-polymyx], Oxycodone, and Tramadol hcl   Social History   Socioeconomic History   Marital status: Married    Spouse name: Not on file   Number of children: 2   Years of education: master's   Highest education level: Not on file  Occupational History   Occupation: Sport and exercise psychologist    Comment: retired  Tobacco Use   Smoking status: Never   Smokeless tobacco: Never  Building services engineer Use: Never used  Substance and Sexual Activity   Alcohol use: No   Drug use: No   Sexual activity: Yes    Birth control/protection: None  Other Topics Concern   Not on file  Social History Narrative   Lives in [place] with spouse.   Retired Sport and exercise psychologist   Social Determinants of Corporate investment banker Strain: Not on file  Food Insecurity:  Not on file  Transportation Needs: Not on file  Physical Activity: Not on file  Stress: Not on file  Social Connections: Not on file    Socially he is married for 50 years.  He is a retired Sport and exercise psychologist and retired at age 61.  He has 2 children ages 34 and 65 and grandchildren.  Family History:  The patient's family history includes Aortic aneurysm in his maternal grandmother; CAD in his paternal grandfather; Diabetes in his paternal grandfather; Heart attack in his maternal grandfather; Pancreatic cancer in his mother.   His father died at age 17 with "old age. "His mother died at 45 and had pancreatic cancer.  He has 2 sisters.  ROS General: Negative; No fevers, chills, or night sweats;  HEENT: Negative; No changes in vision or hearing, sinus congestion, difficulty  swallowing Pulmonary: Negative; No cough, wheezing, shortness of breath, hemoptysis Cardiovascular: History of small perimembranous VSD; PAF; hypertension, hyperlipidemia; status post AF/flutter ablation GI: Negative; No nausea, vomiting, diarrhea, or abdominal pain GU: Negative; No dysuria, hematuria, or difficulty voiding Musculoskeletal: Negative; no myalgias, joint pain, or weakness Hematologic/Oncology: Negative; no easy bruising, bleeding Endocrine: Negative; no heat/cold intolerance; no diabetes Neuro: Negative; no changes in balance, headaches Skin: Negative; No rashes or skin lesions Psychiatric: Negative; No behavioral problems, depression Sleep: See HPI Other comprehensive 14 point system review is negative.   PHYSICAL EXAM:   VS:  BP 124/74   Pulse (!) 57   Ht 6' 1.75" (1.873 m)   Wt 237 lb 3.2 oz (107.6 kg)   SpO2 93%   BMI 30.66 kg/m     Repeat blood pressure by me was 116/72  Wt Readings from Last 3 Encounters:  04/29/23 237 lb 3.2 oz (107.6 kg)  03/04/23 230 lb (104.3 kg)  12/24/22 231 lb (104.8 kg)     General: Alert, oriented, no distress.  Skin: normal turgor, no rashes, warm and dry HEENT: Normocephalic, atraumatic. Pupils equal round and reactive to light; sclera anicteric; extraocular muscles intact;  Nose without nasal septal hypertrophy Mouth/Parynx benign; Mallinpatti scale 4 Neck: No JVD, no carotid bruits; normal carotid upstroke Lungs: clear to ausculatation and percussion; no wheezing or rales Chest wall: without tenderness to palpitation Heart: PMI not displaced, RRR, s1 s2 normal, 2/6 systolic murmur secondary to his VSD, no diastolic murmur, no rubs, gallops, thrills, or heaves Abdomen: soft, nontender; no hepatosplenomehaly, BS+; abdominal aorta nontender and not dilated by palpation. Back: no CVA tenderness Pulses 2+ Musculoskeletal: full range of motion, normal strength, no joint deformities Extremities: Left-sided varicose veins; no  clubbing cyanosis or edema, Homan's sign negative  Neurologic: grossly nonfocal; Cranial nerves grossly wnl Psychologic: Normal mood and affect     Studies/Labs Reviewed:   EKG Interpretation Date/Time:  Wednesday April 29 2023 09:45:53 EDT Ventricular Rate:  57 PR Interval:  160 QRS Duration:  104 QT Interval:  410 QTC Calculation: 399 R Axis:   -15  Text Interpretation: Sinus bradycardia with sinus arrhythmia with occasional Premature ventricular complexes When compared with ECG of 04-Mar-2023 18:22, Incomplete right bundle branch block is no longer Present Confirmed by Nicki Guadalajara ([id number]) on 04/29/2023 9:55:32 AM    October 18, 2020  ECG (independently read by me): Sinus bradycardia at 52 bpm, PAC, incomplete right bundle branch block  September 23, 2019 ECG (independently read by me): Normal sinus rhythm with PAC mild RV conduction delay.  Normal intervals  Recent Labs:    Latest Ref Rng & Units 03/04/2023  6:52 PM 12/09/2022    2:19 PM 09/25/2022   12:44 AM  BMP  Glucose 70 - 99 mg/dL 440  102  725   BUN 8 - 23 mg/dL 28  21  21    Creatinine 0.61 - 1.24 mg/dL 3.66  4.40  3.47   Sodium 135 - 145 mmol/L 139  138  137   Potassium 3.5 - 5.1 mmol/L 4.1  4.3  3.7   Chloride 98 - 111 mmol/L 106  104  103   CO2 22 - 32 mmol/L 24  27  27    Calcium 8.9 - 10.3 mg/dL 9.2  8.9  8.9         Latest Ref Rng & Units 09/25/2022   12:44 AM 09/24/2022    3:26 PM 03/17/2021   12:10 AM  Hepatic Function  Total Protein 6.5 - 8.1 g/dL 5.8  6.3  5.8   Albumin 3.5 - 5.0 g/dL 3.2  3.4  3.4   AST 15 - 41 U/L 37  41  24   ALT 0 - 44 U/L 56  57  23   Alk Phosphatase 38 - 126 U/L 75  84  66   Total Bilirubin 0.3 - 1.2 mg/dL 0.5  0.7  0.8        Latest Ref Rng & Units 03/04/2023    6:25 PM 12/09/2022    2:19 PM 09/25/2022   12:44 AM  CBC  WBC 4.0 - 10.5 K/uL 6.4  6.5  7.7   Hemoglobin 13.0 - 17.0 g/dL 42.5  95.6  38.7   Hematocrit 39.0 - 52.0 % 44.4  44.7  42.1   Platelets 150 - 400 K/uL  189  178  174    Lab Results  Component Value Date   MCV 87.6 03/04/2023   MCV 92.0 12/09/2022   MCV 88.4 09/25/2022   Lab Results  Component Value Date   TSH 5.640 (H) 03/04/2023   Lab Results  Component Value Date   HGBA1C 5.5 12/09/2022     BNP No results found for: "BNP"  ProBNP    Component Value Date/Time   PROBNP 193.9 (H) 08/12/2014 1614     Lipid Panel     Component Value Date/Time   CHOL 104 03/18/2021 0605   CHOL 139 09/20/2019 0933   CHOL 131 01/22/2015 0812   TRIG 50 03/18/2021 0605   TRIG 93 01/22/2015 0812   HDL 37 (L) 03/18/2021 0605   HDL 52 09/20/2019 0933   HDL 49 01/22/2015 0812   CHOLHDL 2.8 03/18/2021 0605   VLDL 10 03/18/2021 0605   LDLCALC 57 03/18/2021 0605   LDLCALC 74 09/20/2019 0933   LDLCALC 63 01/22/2015 0812   LABVLDL 13 09/20/2019 0933     RADIOLOGY: No results found.    Additional studies/ records that were reviewed today include:  I reviewed the records of Dr. Rennis Golden, the patient's 2017 sleep study, 2020 sleep study, and obtained a download from October 19 through September 20, 2019.  New download was obtained from September 17, 2020 through October 16, 2020. Records of Dr. Johney Frame were reviewed  New download was obtained from November 20 through October 21, 2022.  ASSESSMENT:    1. OSA (obstructive sleep apnea)   2. Essential hypertension   3. VSD (ventricular septal defect)   4. Coronary artery disease involving native coronary artery of native heart without angina pectoris   5. PAF (paroxysmal atrial fibrillation) (HCC)   6. S/P ablation of atrial fibrillation  7. 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 Rennis Golden and has a history of a small perimembranous VSD, hypertension, CAD, PAF on anticoagulation therapy, hyperlipidemia, and mild ectatic dilated aortic root.  Remotely he was diagnosed with sleep apnea over 11 years ago and for period of  time was on CPAP therapy.  On his sleep study in 2017 he was found to have moderate sleep apnea during REM sleep but overall his AHI was only 2.5/h and he was not approved to reinstitute CPAP therapy.  A repeat evaluation in 2020 again confirmed obstructive sleep apnea.  CPAP therapy was resumed in August 2020 and when seen for initial compliance evaluation in November 2020 he was doing well.  However, his ramp time was prolonged and he was not feeling that he was getting adequate air in the early portion of the night.  As result I discontinued his ramp time.  I changed him to an auto mode.  His download from September 17, 2020 through October 16, 2020 showed excellent compliance.  With his pressure range from 10 up to 20 cm, AHI is excellent at 1.6.  His 95th percentile pressure is 12.0 and maximum average pressure 13.2.  He denies any residual daytime sleepiness or breakthrough snoring.  He is sleeping well.  He underwent successful rotator cuff surgery of his left shoulder and also underwent atrial fibrillation/flutter ablation in September by Dr. Johney Frame.  He has a documented VSD and also has left-sided varicose veins.  Blood pressure today is stable on valsartan 160 mg.  He is anticoagulated on Xarelto 20 mg.  He takes pantoprazole for GERD.  He is in need of new supplies.  We will transfer him to [employer] to be his DME company particularly since his wife uses this company as well.  He is in need for a new mask.  In the office I provided him with a new ResMed AirFit F30i mask which I think he will like compared to his prior fullface mask.  I discussed optimal sleep duration with CPAP at least 7 hours per night.  I will make slight modification to his CPAP and changes setting to a range of 11 to 20 cm of water.  He will see Dr. Rennis Golden in July for follow-up Cardiologic evaluation.  I will see him in April/May 2025 for follow-up sleep assessment.   Medication Adjustments/Labs and Tests Ordered: Current medicines  are reviewed at length with the patient today.  Concerns regarding medicines are outlined above.  Medication changes, Labs and Tests ordered today are listed in the Patient Instructions below. Patient Instructions  Medication Instructions:  NO CHANGES  *If you need a refill on your cardiac medications before your next appointment, please call your pharmacy*  Follow-Up: At [facility], you and your health needs are our priority.  As part of our continuing mission to provide you with exceptional heart care, we have created designated Provider Care Teams.  These Care Teams include your primary Cardiologist (physician) and Advanced Practice Providers (APPs -  Physician Assistants and Nurse Practitioners) who all work together to provide you with the care you need, when you need it.  We recommend signing up for the patient portal called "MyChart".  Sign up information is provided on this After Visit Summary.  MyChart is used to connect with patients for Virtual Visits (Telemedicine).  Patients are able to view lab/test results, encounter notes, upcoming appointments,  etc.  Non-urgent messages can be sent to your provider as well.   To learn more about what you can do with MyChart, go to [email or web address].    Your next appointment:    April/May 2025 with Dr. Tresa Endo   Other Instructions  Diastasis Recti / Ventral Hernia      Signed, Nicki Guadalajara, MD,FACC, ABSM Diplomate, American Board of Sleep Medicine  04/30/2023 8:02 PM    [facility] [address], [place], [place]  [address] Phone: [phone]

## 2023-04-29 NOTE — Progress Notes (Signed)
Changed device settings: Min Pressure changed from 10 to 11. New mask given today 04/29/23

## 2023-04-30 ENCOUNTER — Encounter: Payer: Self-pay | Admitting: Cardiovascular Disease

## 2023-05-22 ENCOUNTER — Encounter: Payer: Self-pay | Admitting: Internal Medicine

## 2023-05-22 ENCOUNTER — Ambulatory Visit: Payer: Medicare Other | Attending: Internal Medicine | Admitting: Internal Medicine

## 2023-05-22 VITALS — BP 112/70 | HR 76 | Ht 73.0 in | Wt 235.4 lb

## 2023-05-22 DIAGNOSIS — Q210 Ventricular septal defect: Secondary | ICD-10-CM | POA: Insufficient documentation

## 2023-05-22 DIAGNOSIS — G4733 Obstructive sleep apnea (adult) (pediatric): Secondary | ICD-10-CM | POA: Insufficient documentation

## 2023-05-22 DIAGNOSIS — E785 Hyperlipidemia, unspecified: Secondary | ICD-10-CM | POA: Diagnosis not present

## 2023-05-22 DIAGNOSIS — I10 Essential (primary) hypertension: Secondary | ICD-10-CM | POA: Insufficient documentation

## 2023-05-22 DIAGNOSIS — I77810 Thoracic aortic ectasia: Secondary | ICD-10-CM | POA: Insufficient documentation

## 2023-05-22 NOTE — Patient Instructions (Signed)
Medication Instructions:  NO CHANGES  *If you need a refill on your cardiac medications before your next appointment, please call your pharmacy*   Follow-Up: At [facility], you and your health needs are our priority.  As part of our continuing mission to provide you with exceptional heart care, we have created designated Provider Care Teams.  These Care Teams include your primary Cardiologist (physician) and Advanced Practice Providers (APPs -  Physician Assistants and Nurse Practitioners) who all work together to provide you with the care you need, when you need it.  We recommend signing up for the patient portal called "MyChart".  Sign up information is provided on this After Visit Summary.  MyChart is used to connect with patients for Virtual Visits (Telemedicine).  Patients are able to view lab/test results, encounter notes, upcoming appointments, etc.  Non-urgent messages can be sent to your provider as well.   To learn more about what you can do with MyChart, go to [email or web address].    Your next appointment:    12 months Dr. Rennis Golden

## 2023-05-22 NOTE — Progress Notes (Signed)
OFFICE NOTE  Chief Complaint:  No complaints  Primary Care Physician: Sigmund Hazel, MD  HPI:  Jose Stone is a very pleasant 77 year-old male who is a former [employer] and exercise psychologist. 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 This is never caused issues in fact she's had right and left heart catheterizations dating back to the 1970s. 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 Other coronary risk factors include hypertension, dyslipidemia and family history of heart disease. He was sent for cardiac catheterization in 2006 for follow-up of VSD, at which time he had an echocardiogram which showed mild aortic regurgitation, a small membranous VSD and left atrial enlargement with an EF of 70%. He had a stress test at that time which showed findings concerning for him for a septal and inferoapical ischemia. He therefore was referred to left heart catheterization with strep demonstrated a 50-70% mid LAD stenosis and mild disease of the mid circumflex and first obtuse marginal branches. He's been managed medically since that time and previously saw Dr. Verdis Prime.  Recently he's been having some symptoms of sweating, palpitations and dizziness and eventually was found to be in atrial fibrillation when he presented to the hospital. At that time his troponin was elevated. He is placed on diltiazem which slowed his heart rate and he converted back to sinus spontaneously. Based on his elevated troponin, he was referred for left heart catheterization by Dr. Algie Coffer. This demonstrated a 60-70% mid LAD stenosis and a 90% obtuse marginal stenosis. Subsequently he underwent coronary intervention with placement of a 2.5 x 15 mm Xience Alpine stent to the mid OM. After this he became asymptomatic. He was going to have stress testing to determine the significance of his mid LAD lesion however it appears to be not significantly changed since his heart  catheterization 2006. He reports no further symptoms of atrial fibrillation but is concerned about bradycardia. He was not referred for cardiac rehabilitation due to the thought that he may need additional intervention to the LAD.  Mr. Dallman returns today for follow-up. She's done quite well in cardiac rehabilitation. At his last office visit we discontinued his diltiazem for bradycardia. He still has a low heart rate which has some degree of chronotropic incompetence. He reports no recurrent atrial fibrillation that were aware of. He continues on dual antiplatelet therapy. He had a stress test prior to enrolling in cardiac redilatation which was negative for ischemia.  I saw Mr. Andreoni back in the office today. He is accompanied by his wife who was a cardiac nurse at the [place] number years in the 1970s. They brought in pronounced of his heart rate monitor and had several questions about his heart rate and arrhythmias. My interpretation of the monitor indicates that he had some episodes of short bursts of atrial tachycardia but no evidence for A. fib. He also has PACs. Heart rate however was a low and remained that way despite stopping his calcium channel blocker. He was recently in the emergency room and was noted to have some paroxysmal tachycardia in the ER with a right bundle-branch pattern. This is what led to monitor placement. He reports fatigue which could be certainly related to his lower heart rate. He also says that he's developed gynecomastia which he relates to Brilinta. I've done a search on the medicine and do not see that however he reports in  the product labeling.  03/21/2016  Mr. Levee was seen today in the office for an acute visit. He noted this morning shortly after waking up his heart rate was elevated in the low 100s. It improved somewhat and then around 9:00 it increased up to about 130. The heart rate is been stable since then and he called in for a new acute  triage visit. He reports feeling somewhat fatigued and noting that his heart rate is elevated. He denies any worsening shortness of breath or chest pain. EKG in the office shows atrial flutter with a regular rhythm at 131 bpm. As per recent notes he was previous on aspirin and clopidogrel but the caput ago was stopped. He was not on triple therapy. Currently he is only on low-dose aspirin. 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 I'm concerned about up titrating his medications because she's had history of slow ventricular rates in the past. We discussed a number of options but ultimately the best option is for him to undergo cardioversion to get back into normal rhythm.  04/17/2016  Mr. Voorhees was seen back in the office today for follow-up. At his last office visit he was noted to be in atrial flutter with a rate of 131. He was referred directly to the ER and underwent cardioversion after being started on Xarelto. He is remained on Xarelto since then without any bleeding problems. I'm happy to say he is maintaining sinus rhythm today. He does report significant fatigue however. He is mostly compliant with CPAP however he had a sleep study a number of years ago and he is set possibly to low of an airway setting. He's had weight gain since then. He also has been having some bradycardia with heart rates in the 40s and 50s. He is on low-dose beta blocker but I been hesitant to stop that because of his RVR. He does report heart rate increases with exercise.   06/20/2016  Mr. Mcglasson returns today for follow-up. He underwent a sleep study on 06/05/2016. He did have a diagnosis of sleep apnea however that study indicated a very low AHI of 2.5 events per hour. He had actually about 25 events per hour in rem sleep, but overall the REM sleep latency was only about 8%. Ultimately he was diagnosis not having any significant apnea. This is led to some confusion as he previously was  wearing [place] CPAP device. He reports no change in his symptoms of fatigue which we thought could be related to sleep apnea. It also may be related to bradycardia and his beta blocker.  09/23/2016  Mr. Soley comes back today and is doing well. His fatigue has improved somewhat with discontinuing his B-Blocker. Heartrate is increased. I spoke with Dr. Tresa Endo about the possibility of OSA and he is not felt to have this. Dr. Tresa Endo noted that RLS was present. He does not desire treatment for this. Blood pressure was somewhat elevated today. EKG shows sinus with PAC's. I reviewed a spreadsheet of his blood pressures today.  03/31/2017  Mr. Utsey was seen today in follow-up. Overall he is doing very well. He denies any chest pain worsening shortness of breath. Blood pressure is well-controlled. He brought in a graft indicating excellent control blood pressure and heart rate. He said no breakthrough A. fib. His recent lab work was provided by his PCP. Total cholesterol 134, trig Richards 85, HDL-C 49, LDL-C 68, non-HDL of 85. This represents excellent control. He is planning  a trip out [place] is been provided some Diamox to use as needed. He denies any bleeding problems on Xarelto.  09/03/2017  Mr. Geister returns today for follow-up.  He underwent a recent stress test after I saw him in the emergency department for chest pain.  At the time he took some nitroglycerin which resulted in improvement in his symptoms however ruled out for MI by negative troponins.  His stress test demonstrated a small area of reversible ischemia in the inferolateral wall.  It was interpreted as intermediate risk.  Since then he has had no further chest pain.  I discussed the findings with him today and we talked about various options including medical therapy and/or repeat cardiac catheterization.  As he remains asymptomatic, I do not feel that medication adjustments necessary at this time.  Finally, he notes that he has had some  morning hypertension which is more significant than his daytime readings.  10/15/2017  Bradycardia returns today for follow-up.  As previously noted he had a mildly abnormal stress test and was having some chest discomfort.  That is completely resolved.  We have moved around his medications a little bit but he notes that his blood pressure still remains elevated at times during the day.  Echo was performed on 10/01/2017 which showed LVEF that was higher at 60-65%, stable dilated aortic root at 4.4 cm and the ascending aorta measured 4.0 cm.  No regional wall motion abnormalities were noted.  01/15/2018  Mr. Magallanes returns today for follow-up of his blood pressure.  He reports after adjusting his medicines that in general his blood pressures been well controlled.  He denies any recurrent episodes of dizziness or lightheadedness.  Recently had some testing of testosterone.  He has had a number of urologic issues and possible damage to blood flow to 1 of the testicles.  That coupled with age I suspect his lead to low testosterone.  Recently as low as the 200s.  His urologist Dr. Mena Goes, is recommending supplemental testosterone.  He is asking for my cardiac opinion whether this is safe to use.  07/19/2018  Mr. Hockett returns today for hospital follow-up.  Fortunately was seen in the ER for acute onset A. fib with RVR.  This is in the setting of what appears to be marked dehydration.  He was working outside in the heat and developed acute renal failure.  Potassium was up to 6.1.  He responded to hydration and repeat renal profile last week showed that his creatinine returned to baseline.  Potassium was actually slightly low at 3.3.  He is not on repletion.  I stopped his chlorthalidone and reduced his ARB to 160 mg valsartan daily.  He denies any recurrent A. Fib.  11/23/2018  Mr. Liou seen today for labile blood pressures.  He recently messaged the office and was noted to have elevated blood pressures.   Previously had been on higher dose blood pressure medication however this was reduced including stopping chlorthalidone and reducing his ARB because of hyperkalemia.  The decrease in ARB probably was the reason why his blood pressures have remained elevated.  His wife is also concerned about a wider pulse pressure which at times can be close to 100 mmHg.  Despite this diastolics have not been below 50.  EKG shows sinus rhythm with sinus arrhythmia incomplete right bundle branch today at 66.  02/22/2020  Mr. Daws seen today in follow-up.  Overall he seems to be doing well.  He had some recent  surgery including a brow/lid lift.  He is also had a toe fusion.  Of note, he was noted to be tachycardic today and he seemed to be unaware of this.  EKG was performed which showed A. fib with RVR.  He has had this in the past and is spontaneously converted, in fact once we set him up for cardioversion.  He is on Xarelto and low-dose diltiazem.  He is scheduled to have an MRI tomorrow of his shoulder with a plan for upcoming shoulder surgery.  05/10/2020  Mr. Dunklee seen today in follow-up.  EKG shows he is maintaining sinus rhythm.  He underwent successful left shoulder surgery.  He is slow to recover from this and still has some pain.  Blood pressure is elevated today.  He was off of Xarelto for short period of time but then restarted it.  I also readjusted his medications including increasing his diltiazem and decreasing the valsartan.  If possible we may need to go back up on the valsartan however I would like for him to monitor his blood pressure.  07/19/2021  Mr. Charlot is seen today in follow-up.  He seems to be doing well still after PCI in May of this year.  He is transition from aspirin Plavix and Xarelto to Plavix and Xarelto which we will continue at least until May 2023.  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  He does have some aortic insufficiency which we will continue to follow.  He will be due for repeat CT again next year.  He denies any chest pain.  EKG shows sinus bradycardia with some PVCs today.  Blood pressure is well controlled.  Lipids are at goal as of May showing total cholesterol 104, HDL 37, triglycerides 50 and LDL 57.  04/22/2022  Mr. Barbier returns today for follow-up.  Overall he is doing well.  He had a repeat CT scan in May of this year which showed an ectatic aorta measuring 4 cm at the root and the ascending aorta only measured 3.8 cm.  Therefore no evidence of any significant aneurysm.  He denies chest pain or shortness of breath.  EKG today is stable with a sinus bradycardia.  Blood pressure is well controlled.  He is due for repeat lipids which were last assessed in May 2022.  05/22/2023  Mr. Jutras reports has had an eventful year.  Unfortunately was diagnosed with prostate cancer but has undergone radiation therapy at [facility].  He is also had an issue with a toe infection and had some surgery.  He was seen in the emergency department for palpitations in May and felt that he had breakthrough A-fib but was not noted to be in A-fib when he was there.  He had a recent repeat EKG which showed sinus rhythm.  He was seen by Dr. Tresa Endo and has had some new CPAP equipment ordered.  Blood pressure remains well-controlled.  A repeat CT scan of the aorta showed borderline aortic ectasia with a measurement of about 38 to 39 mm.  This is not considered dilated.  I would not necessarily think he needs repeat imaging next year.  PMHx:  Past Medical History:  Diagnosis Date   Anemia    Aortic root enlargement (HCC)    Atrial fibrillation with RVR (HCC) 07/13/2018   CAD (coronary artery disease)    Complication of anesthesia  Hypotensive with general anesth.   GERD (gastroesophageal reflux disease)    Hyperlipidemia    Hypertension    Melanoma (HCC)    prostate cancer also - rad tx  completes on 12/10/22   Myocardial infarction (HCC)    2015   Persistent atrial fibrillation (HCC)    Afib,Aflutter   Pre-diabetes    Sleep apnea    cpap   Typical atrial flutter (HCC)    Vasc compress esophag aberr right subclav artry aris from desc aorta    Ventricular septal defect    VSD (ventricular septal defect)     Past Surgical History:  Procedure Laterality Date   ATRIAL FIBRILLATION ABLATION N/A 07/20/2020   Procedure: ATRIAL FIBRILLATION ABLATION;  Surgeon: Hillis Range, MD;  Location: [facility] INVASIVE CV LAB;  Service: Cardiovascular;  Laterality: N/A;   BONE BIOPSY Left 12/24/2022   Procedure: BONE BIOPSY;  Surgeon: Pilar Plate, DPM;  Location: [facility] ORS;  Service: Podiatry;  Laterality: Left;   CARDIAC CATHETERIZATION     CARDIOVERSION N/A 02/29/2020   Procedure: CARDIOVERSION;  Surgeon: Chrystie Nose, MD;  Location: [facility] ENDOSCOPY;  Service: Cardiovascular;  Laterality: N/A;   CARDIOVERSION N/A 06/28/2020   Procedure: CARDIOVERSION;  Surgeon: Vesta Mixer, MD;  Location: [facility] ENDOSCOPY;  Service: Cardiovascular;  Laterality: N/A;   CORONARY STENT INTERVENTION N/A 03/18/2021   Procedure: CORONARY STENT INTERVENTION;  Surgeon: Kathleene Hazel, MD;  Location: [facility] INVASIVE CV LAB;  Service: Cardiovascular;  Laterality: N/A;   DISTAL INTERPHALANGEAL JOINT FUSION Left 01/19/2019   Procedure: HALLUX INTERPHANGEAL JOINT FUSION LEFT FOOT;  Surgeon: Vivi Barrack, DPM;  Location: [facility] OR;  Service: Podiatry;  Laterality: Left;  LEFT BLOCK   EXOSTECTECTOMY TOE Left 12/24/2022   Procedure: EXOSTECTECTOMY TOE, ARTHROPLASTY OF IPJ;  Surgeon: Pilar Plate, DPM;  Location: [facility] ORS;  Service: Podiatry;  Laterality: Left;   HARDWARE REMOVAL Left 12/24/2022   Procedure: HARDWARE REMOVAL;  Surgeon: Pilar Plate, DPM;  Location: [facility] ORS;  Service: Podiatry;  Laterality: Left;   hipospedious     LEFT HEART CATH AND CORONARY ANGIOGRAPHY N/A 03/18/2021    Procedure: LEFT HEART CATH AND CORONARY ANGIOGRAPHY;  Surgeon: Kathleene Hazel, MD;  Location: [facility]AS[facility] CV LAB;  Service: Cardiovascular;  Laterality: N/A;   LEFT HEART CATHETERIZATION WITH CORONARY ANGIOGRAM N/A 08/14/2014   Procedure: LEFT HEART CATHETERIZATION WITH CORONARY ANGIOGRAM;  Surgeon: Ricki Rodriguez, MD;  Location: [facility] CATH LAB;  Service: Cardiovascular;  Laterality: N/A;   MASS EXCISION Left 01/19/2019   Procedure: EXCISION BENIGN LESION 2.OCM LEFT FOOT;  Surgeon: Vivi Barrack, DPM;  Location: [facility] OR;  Service: Podiatry;  Laterality: Left;   SHOULDER ARTHROSCOPY WITH SUBACROMIAL DECOMPRESSION Left 05/03/2020   Procedure: SHOULDER ARTHROSCOPY DEBRIDEMENT PARTIAL ROTATOR CUFF REPAIR, SUBACROMIAL DECOMPRESSION, BICEPS TENOTOMY;  Surgeon: Jones Broom, MD;  Location: [facility] ORS;  Service: Orthopedics;  Laterality: Left;   SHOULDER SURGERY     TONSILLECTOMY  1955    FAMHx:  Family History  Problem Relation Age of Onset   Pancreatic cancer Mother    Aortic aneurysm Maternal Grandmother    Heart attack Maternal Grandfather    Diabetes Paternal Grandfather    CAD Paternal Grandfather     SOCHx:   reports that he has never smoked. He has never used smokeless tobacco. He reports that he does not drink alcohol and does not use drugs.  ALLERGIES:  Allergies  Allergen Reactions   Ciprofloxacin Other (See Comments)    Aggravates  Bulging aorta   Codeine Nausea Only and Other (See Comments)    Can tolerate with benadryl   Diltiazem Cd [Diltiazem Hcl] Other (See Comments)    Bradycardia   Mupirocin Itching   Bacitra-Neomycin-Polymyxin-Hc Rash   Hydrocodone Rash and Other (See Comments)    Can tolerate with benadryl   Neosporin [Bacitracin-Polymyxin B] Rash   Neosporin [Neomycin-Bacitracin Zn-Polymyx] Rash   Oxycodone Rash and Other (See Comments)    Can tolerate with benadryl   Tramadol Hcl Rash    ROS: Pertinent items noted in HPI and remainder of comprehensive  ROS otherwise negative.  HOME MEDS: Current Outpatient Medications  Medication Sig Dispense Refill   acetaminophen (TYLENOL) 500 MG tablet Take 500-1,000 mg by mouth every 6 (six) hours as needed for mild pain or headache.     albuterol (VENTOLIN HFA) 108 (90 Base) MCG/ACT inhaler Inhale 2 puffs into the lungs every 6 (six) hours as needed for wheezing or shortness of breath.     alendronate (FOSAMAX) 70 MG tablet Take 70 mg by mouth every Wednesday. Take with a full glass of water on an empty stomach.     allopurinol (ZYLOPRIM) 100 MG tablet Take 200 mg by mouth in the morning.  0   atorvastatin (LIPITOR) 80 MG tablet TAKE 1 TABLET BY MOUTH DAILY AT 6 PM (Patient taking differently: Take 80 mg by mouth every evening.) 90 tablet 3   COLCRYS 0.6 MG tablet Take 0.6 mg by mouth daily as needed (FOR GOUT FLARES). (Patient not taking: Reported on 04/29/2023)     ferrous sulfate 325 (65 FE) MG tablet Take 325 mg by mouth every evening.     ketotifen (ZADITOR) 0.025 % ophthalmic solution Place 1 drop into both eyes 2 (two) times daily.     Light Mineral Oil-Mineral Oil (RETAINE MGD) 0.5-0.5 % EMUL Place 1 drop into both eyes in the morning, at noon, and at bedtime.     loratadine (CLARITIN) 10 MG tablet Take 10 mg by mouth daily as needed for allergies.     Multiple Vitamins-Minerals (CENTRUM SILVER 50+MEN) TABS Take 1 tablet by mouth daily with breakfast.     nitroGLYCERIN (NITROSTAT) 0.4 MG SL tablet PLACE 1 TAB UNDER TONGUE EVERY 5 MINS AS NEEDED FOR CHEST PAIN,MAX 3 DOSES (Patient not taking: Reported on 04/29/2023) 25 tablet 5   Omega-3 Fatty Acids (OMEGA 3 PO) Take 3 capsules by mouth daily.     ondansetron (ZOFRAN) 4 MG tablet Take 1 tablet (4 mg total) by mouth every 8 (eight) hours as needed for nausea or vomiting. (Patient not taking: Reported on 04/29/2023) 20 tablet 0   pantoprazole (PROTONIX) 40 MG tablet TAKE 1 TABLET BY MOUTH EVERY DAY (Patient taking differently: Take 40 mg by mouth daily  before breakfast.) 90 tablet 4   sildenafil (REVATIO) 20 MG tablet Take 20 mg by mouth as needed for erectile dysfunction. (Patient not taking: Reported on 04/29/2023)     tamsulosin (FLOMAX) 0.4 MG CAPS capsule Take 0.4 mg by mouth.  2 times daily     valsartan (DIOVAN) 160 MG tablet TAKE 1 TABLET BY MOUTH EVERY DAY 90 tablet 2   vitamin B-12 (CYANOCOBALAMIN) 500 MCG tablet Take 500 mcg by mouth every evening.     XARELTO 20 MG TABS tablet TAKE 1 TABLET BY MOUTH DAILY WITH SUPPER 90 tablet 1   No current facility-administered medications for this visit.    LABS/IMAGING: No results found for this or any previous visit (from the  past 48 hour(s)). No results found.  VITALS: There were no vitals taken for this visit.  EXAM: General appearance: alert and no distress Neck: no carotid bruit, no JVD and thyroid not enlarged, symmetric, no tenderness/mass/nodules Lungs: clear to auscultation bilaterally Heart: irregularly irregular rhythm and tachycardic Abdomen: soft, non-tender; bowel sounds normal; no masses,  no organomegaly Extremities: extremities normal, atraumatic, no cyanosis or edema Pulses: 2+ and symmetric Skin: Skin color, texture, turgor normal. No rashes or lesions Neurologic: Grossly normal Psych: Pleasant  EKG: N/A  ASSESSMENT: A. 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   Mr. Cuevas  seems to be doing well although he had recent diagnosis of prostate cancer which has been treated.  He was found to have borderline dilated aortic root which we may consider reimaging in a few years.  He may have had some recurrent atrial fibs or flutter but has been in sinus rhythm.  He is anticoagulated.  He recently had new CPAP equipment ordered.  No changes to his meds today.  Will order repeat lipid profile since his last study was in 2022.  Follow-up with me annually or sooner as necessary.  Chrystie Nose, MD, [facility], FACP  [place]  [facility]  Medical Director of the Advanced Lipid Disorders &  Cardiovascular Risk Reduction Clinic Attending Cardiologist  Direct Dial: [phone]  Fax: [phone]  Website:  [email or web address] Nicely Anaria Kroner 05/22/2023, 1:43 PM

## 2023-05-25 ENCOUNTER — Encounter: Payer: Self-pay | Admitting: Cardiovascular Disease

## 2023-05-27 DIAGNOSIS — E785 Hyperlipidemia, unspecified: Secondary | ICD-10-CM | POA: Diagnosis not present

## 2023-05-28 LAB — LIPID PANEL
Chol/HDL Ratio: 2.7 ratio (ref 0.0–5.0)
Cholesterol, Total: 144 mg/dL (ref 100–199)
LDL Chol Calc (NIH): 73 mg/dL (ref 0–99)
Triglycerides: 88 mg/dL (ref 0–149)
VLDL Cholesterol Cal: 17 mg/dL (ref 5–40)

## 2023-06-04 ENCOUNTER — Other Ambulatory Visit: Payer: Self-pay | Admitting: Internal Medicine

## 2023-06-25 ENCOUNTER — Telehealth: Payer: Self-pay | Admitting: Cardiovascular Disease

## 2023-06-25 NOTE — Telephone Encounter (Signed)
Caller is following-up on orders for patient's sleep equipment.

## 2023-06-26 NOTE — Telephone Encounter (Signed)
Spoke with patient and notified that all documentation submitted to [facility] today 06/26/23.

## 2023-06-26 NOTE — Telephone Encounter (Signed)
Left VM with callback number to follow up about sleep equipment, per patient.

## 2023-07-23 ENCOUNTER — Ambulatory Visit (INDEPENDENT_AMBULATORY_CARE_PROVIDER_SITE_OTHER): Payer: Medicare Other | Admitting: Podiatry

## 2023-07-23 DIAGNOSIS — L97521 Non-pressure chronic ulcer of other part of left foot limited to breakdown of skin: Secondary | ICD-10-CM | POA: Diagnosis not present

## 2023-07-23 DIAGNOSIS — Z9889 Other specified postprocedural states: Secondary | ICD-10-CM | POA: Diagnosis not present

## 2023-07-23 DIAGNOSIS — M7752 Other enthesopathy of left foot: Secondary | ICD-10-CM | POA: Diagnosis not present

## 2023-07-23 NOTE — Progress Notes (Signed)
Subjective:  [name], male    DOB: [birth year],  MRN: [id number]  Chief Complaint  Patient presents with   Routine Post Op    POV Left hallux surgery 3 months follow up. Patient is doing well. Denies any pain at this time. He does have a small skin or nail to his nail bed causing some discomfort.     77 y.o. male presents following up postoperatively status post left hallux exostectomy and removal of hardware.  He is doing very well.  That surgery was December 24, 2022.  He states he is very satisfied with the result of the surgery as he no longer has any swelling redness or callus/wound present on the plantar medial aspect of the left hallux much improved from preoperatively.   Has noticed a piece of nail on the left hallux that is causing some irritation.  Past Medical History:  Diagnosis Date   Anemia    Aortic root enlargement (HCC)    Atrial fibrillation with RVR (HCC) 07/13/2018   CAD (coronary artery disease)    Complication of anesthesia    Hypotensive with general anesth.   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    Ventricular septal defect    VSD (ventricular septal defect)     Allergies  Allergen Reactions   Ciprofloxacin Other (See Comments)    Aggravates Bulging aorta   Codeine Nausea Only and Other (See Comments)    Can tolerate with benadryl   Diltiazem Cd [Diltiazem Hcl] Other (See Comments)    Bradycardia   Mupirocin Itching   Bacitra-Neomycin-Polymyxin-Hc Rash   Hydrocodone Rash and Other (See Comments)    Can tolerate with benadryl   Neosporin [Bacitracin-Polymyxin B] Rash   Neosporin  [Neomycin-Bacitracin Zn-Polymyx] Rash   Oxycodone Rash and Other (See Comments)    Can tolerate with benadryl   Tramadol Hcl Rash    ROS: Negative except as per HPI above  Objective:  General: AAO x3, NAD  Dermatological: On the left hallux the wounds are fully healed there is no evidence of recurrence of the plantar medial callus buildup of the ulceration from prior.  Much improved.  Dorsal incisions fully healed no erythema or edema of the left great toe.  Small sliver of nail in the left hallux lateral border.   Vascular:  Dorsalis Pedis artery and Posterior Tibial artery pedal pulses are 2/4 bilateral.  Capillary fill time < 3 sec to all digits.   Neruologic: Grossly absent to light touch bilateral  Musculoskeletal: Edema noted of the left hallux however no open wound present  Gait: Unassisted, Nonantalgic.    Radiographs:  Deferred at this visit Assessment:   1. Bone spur of toe of left foot   2. Status post left foot surgery   3. Ulcer of left foot, limited to breakdown of skin [facility])       Plan:  Patient was evaluated and treated and all questions answered.   # Postoperative to the left hallux after bone spur resection debridement and removal of hardware -He is continuing to  heal his left great toe surgical site very well there is no issues at the site at this time and the dorsal wound is fully healed and there is no recurrence of the plantar medial ulceration of the bottom of the left great toe -Debrided piece of nail in the left hallux lateral border with power Dremel. -Could take this on the future if it continues causing a problem otherwise just file it down to -Continue to monitor for long-term changes to the hallux however this point he appears to be fully resolved and improved postoperatively with no concern per the patient he is happy with the surgical outcome as he no longer has a callus or wound on the plantar medial aspect of the  left hallux IPJ -Follow-up  as needed for        Corinna Gab, DPM [facility] / [facility]

## 2023-07-27 DIAGNOSIS — Z1331 Encounter for screening for depression: Secondary | ICD-10-CM | POA: Diagnosis not present

## 2023-07-27 DIAGNOSIS — M79604 Pain in right leg: Secondary | ICD-10-CM | POA: Diagnosis not present

## 2023-07-27 DIAGNOSIS — Z0000 Encounter for general adult medical examination without abnormal findings: Secondary | ICD-10-CM | POA: Diagnosis not present

## 2023-07-27 DIAGNOSIS — R7303 Prediabetes: Secondary | ICD-10-CM | POA: Diagnosis not present

## 2023-07-27 DIAGNOSIS — R946 Abnormal results of thyroid function studies: Secondary | ICD-10-CM | POA: Diagnosis not present

## 2023-07-27 DIAGNOSIS — I700 Atherosclerosis of aorta: Secondary | ICD-10-CM | POA: Diagnosis not present

## 2023-07-27 DIAGNOSIS — Z6828 Body mass index (BMI) 28.0-28.9, adult: Secondary | ICD-10-CM | POA: Diagnosis not present

## 2023-07-27 DIAGNOSIS — M79605 Pain in left leg: Secondary | ICD-10-CM | POA: Diagnosis not present

## 2023-07-27 DIAGNOSIS — I77810 Thoracic aortic ectasia: Secondary | ICD-10-CM | POA: Diagnosis not present

## 2023-07-27 DIAGNOSIS — M810 Age-related osteoporosis without current pathological fracture: Secondary | ICD-10-CM | POA: Diagnosis not present

## 2023-07-27 DIAGNOSIS — M1A9XX0 Chronic gout, unspecified, without tophus (tophi): Secondary | ICD-10-CM | POA: Diagnosis not present

## 2023-07-27 DIAGNOSIS — Z23 Encounter for immunization: Secondary | ICD-10-CM | POA: Diagnosis not present

## 2023-07-27 DIAGNOSIS — K219 Gastro-esophageal reflux disease without esophagitis: Secondary | ICD-10-CM | POA: Diagnosis not present

## 2023-08-03 ENCOUNTER — Encounter: Payer: Self-pay | Admitting: Internal Medicine

## 2023-08-03 DIAGNOSIS — M25552 Pain in left hip: Secondary | ICD-10-CM | POA: Diagnosis not present

## 2023-08-03 DIAGNOSIS — M25551 Pain in right hip: Secondary | ICD-10-CM | POA: Diagnosis not present

## 2023-08-11 ENCOUNTER — Other Ambulatory Visit: Payer: Self-pay | Admitting: Internal Medicine

## 2023-08-12 ENCOUNTER — Other Ambulatory Visit: Payer: Self-pay | Admitting: Sports Medicine

## 2023-08-12 ENCOUNTER — Encounter: Payer: Self-pay | Admitting: Sports Medicine

## 2023-08-12 DIAGNOSIS — M25552 Pain in left hip: Secondary | ICD-10-CM

## 2023-08-13 ENCOUNTER — Ambulatory Visit
Admission: RE | Admit: 2023-08-13 | Discharge: 2023-08-13 | Disposition: A | Discharge status: Home / Self Care | Payer: Medicare Other | Source: Ambulatory Visit | Attending: Sports Medicine | Admitting: Sports Medicine

## 2023-08-13 DIAGNOSIS — M25552 Pain in left hip: Secondary | ICD-10-CM

## 2023-08-13 DIAGNOSIS — I480 Paroxysmal atrial fibrillation: Secondary | ICD-10-CM | POA: Diagnosis not present

## 2023-08-26 DIAGNOSIS — M25552 Pain in left hip: Secondary | ICD-10-CM | POA: Diagnosis not present

## 2023-08-26 DIAGNOSIS — M7541 Impingement syndrome of right shoulder: Secondary | ICD-10-CM | POA: Diagnosis not present

## 2023-08-26 DIAGNOSIS — M25551 Pain in right hip: Secondary | ICD-10-CM | POA: Diagnosis not present

## 2023-09-01 ENCOUNTER — Ambulatory Visit
Admission: RE | Admit: 2023-09-01 | Discharge: 2023-09-01 | Disposition: A | Discharge status: Home / Self Care | Payer: Medicare Other | Source: Ambulatory Visit | Attending: Family Medicine | Admitting: Family Medicine

## 2023-09-01 DIAGNOSIS — M810 Age-related osteoporosis without current pathological fracture: Secondary | ICD-10-CM

## 2023-09-01 DIAGNOSIS — C61 Malignant neoplasm of prostate: Secondary | ICD-10-CM | POA: Diagnosis not present

## 2023-09-03 DIAGNOSIS — S73192A Other sprain of left hip, initial encounter: Secondary | ICD-10-CM | POA: Diagnosis not present

## 2023-09-03 DIAGNOSIS — M25551 Pain in right hip: Secondary | ICD-10-CM | POA: Diagnosis not present

## 2023-09-22 DIAGNOSIS — C61 Malignant neoplasm of prostate: Secondary | ICD-10-CM | POA: Diagnosis not present

## 2023-09-29 DIAGNOSIS — M6281 Muscle weakness (generalized): Secondary | ICD-10-CM | POA: Diagnosis not present

## 2023-09-29 DIAGNOSIS — M25552 Pain in left hip: Secondary | ICD-10-CM | POA: Diagnosis not present

## 2023-09-29 DIAGNOSIS — M25551 Pain in right hip: Secondary | ICD-10-CM | POA: Diagnosis not present

## 2023-10-06 DIAGNOSIS — S73192D Other sprain of left hip, subsequent encounter: Secondary | ICD-10-CM | POA: Diagnosis not present

## 2023-10-06 DIAGNOSIS — M25511 Pain in right shoulder: Secondary | ICD-10-CM | POA: Diagnosis not present

## 2023-10-06 DIAGNOSIS — M25552 Pain in left hip: Secondary | ICD-10-CM | POA: Diagnosis not present

## 2023-10-06 DIAGNOSIS — M25512 Pain in left shoulder: Secondary | ICD-10-CM | POA: Diagnosis not present

## 2023-10-06 DIAGNOSIS — M6281 Muscle weakness (generalized): Secondary | ICD-10-CM | POA: Diagnosis not present

## 2023-10-06 DIAGNOSIS — M25551 Pain in right hip: Secondary | ICD-10-CM | POA: Diagnosis not present

## 2023-10-08 DIAGNOSIS — M25552 Pain in left hip: Secondary | ICD-10-CM | POA: Diagnosis not present

## 2023-10-08 DIAGNOSIS — M6281 Muscle weakness (generalized): Secondary | ICD-10-CM | POA: Diagnosis not present

## 2023-10-08 DIAGNOSIS — M25551 Pain in right hip: Secondary | ICD-10-CM | POA: Diagnosis not present

## 2023-10-13 DIAGNOSIS — M25552 Pain in left hip: Secondary | ICD-10-CM | POA: Diagnosis not present

## 2023-10-13 DIAGNOSIS — M25551 Pain in right hip: Secondary | ICD-10-CM | POA: Diagnosis not present

## 2023-10-13 DIAGNOSIS — M6281 Muscle weakness (generalized): Secondary | ICD-10-CM | POA: Diagnosis not present

## 2023-10-15 DIAGNOSIS — M6281 Muscle weakness (generalized): Secondary | ICD-10-CM | POA: Diagnosis not present

## 2023-10-15 DIAGNOSIS — M25552 Pain in left hip: Secondary | ICD-10-CM | POA: Diagnosis not present

## 2023-10-15 DIAGNOSIS — M25551 Pain in right hip: Secondary | ICD-10-CM | POA: Diagnosis not present

## 2023-10-17 ENCOUNTER — Other Ambulatory Visit: Payer: Self-pay | Admitting: Internal Medicine

## 2023-10-17 DIAGNOSIS — I480 Paroxysmal atrial fibrillation: Secondary | ICD-10-CM

## 2023-10-19 NOTE — Telephone Encounter (Signed)
Prescription refill request for Xarelto received.  Indication:afib Last office visit:7/24 Weight:106.8  kg Age:77 Scr:0.91  5/24 CrCl:105.95  ml/min  Prescription refilled

## 2023-10-20 DIAGNOSIS — M25551 Pain in right hip: Secondary | ICD-10-CM | POA: Diagnosis not present

## 2023-10-20 DIAGNOSIS — M25552 Pain in left hip: Secondary | ICD-10-CM | POA: Diagnosis not present

## 2023-10-20 DIAGNOSIS — M6281 Muscle weakness (generalized): Secondary | ICD-10-CM | POA: Diagnosis not present

## 2023-10-22 DIAGNOSIS — M25552 Pain in left hip: Secondary | ICD-10-CM | POA: Diagnosis not present

## 2023-10-22 DIAGNOSIS — M25551 Pain in right hip: Secondary | ICD-10-CM | POA: Diagnosis not present

## 2023-10-22 DIAGNOSIS — M6281 Muscle weakness (generalized): Secondary | ICD-10-CM | POA: Diagnosis not present

## 2023-10-26 DIAGNOSIS — M25552 Pain in left hip: Secondary | ICD-10-CM | POA: Diagnosis not present

## 2023-10-26 DIAGNOSIS — M6281 Muscle weakness (generalized): Secondary | ICD-10-CM | POA: Diagnosis not present

## 2023-10-26 DIAGNOSIS — M25551 Pain in right hip: Secondary | ICD-10-CM | POA: Diagnosis not present

## 2023-10-29 DIAGNOSIS — M25551 Pain in right hip: Secondary | ICD-10-CM | POA: Diagnosis not present

## 2023-10-29 DIAGNOSIS — M6281 Muscle weakness (generalized): Secondary | ICD-10-CM | POA: Diagnosis not present

## 2023-10-29 DIAGNOSIS — M25552 Pain in left hip: Secondary | ICD-10-CM | POA: Diagnosis not present

## 2023-11-05 ENCOUNTER — Other Ambulatory Visit: Payer: Self-pay | Admitting: Internal Medicine

## 2024-01-11 DIAGNOSIS — I10 Essential (primary) hypertension: Secondary | ICD-10-CM | POA: Diagnosis not present

## 2024-01-11 DIAGNOSIS — I700 Atherosclerosis of aorta: Secondary | ICD-10-CM | POA: Diagnosis not present

## 2024-01-20 DIAGNOSIS — L905 Scar conditions and fibrosis of skin: Secondary | ICD-10-CM | POA: Diagnosis not present

## 2024-01-20 DIAGNOSIS — D2271 Melanocytic nevi of right lower limb, including hip: Secondary | ICD-10-CM | POA: Diagnosis not present

## 2024-01-20 DIAGNOSIS — L918 Other hypertrophic disorders of the skin: Secondary | ICD-10-CM | POA: Diagnosis not present

## 2024-01-20 DIAGNOSIS — Z85820 Personal history of malignant melanoma of skin: Secondary | ICD-10-CM | POA: Diagnosis not present

## 2024-01-20 DIAGNOSIS — L821 Other seborrheic keratosis: Secondary | ICD-10-CM | POA: Diagnosis not present

## 2024-01-20 DIAGNOSIS — D2272 Melanocytic nevi of left lower limb, including hip: Secondary | ICD-10-CM | POA: Diagnosis not present

## 2024-01-20 DIAGNOSIS — D225 Melanocytic nevi of trunk: Secondary | ICD-10-CM | POA: Diagnosis not present

## 2024-01-27 DIAGNOSIS — K219 Gastro-esophageal reflux disease without esophagitis: Secondary | ICD-10-CM | POA: Diagnosis not present

## 2024-01-27 DIAGNOSIS — M1A9XX0 Chronic gout, unspecified, without tophus (tophi): Secondary | ICD-10-CM | POA: Diagnosis not present

## 2024-01-27 DIAGNOSIS — M2550 Pain in unspecified joint: Secondary | ICD-10-CM | POA: Diagnosis not present

## 2024-01-27 DIAGNOSIS — R7303 Prediabetes: Secondary | ICD-10-CM | POA: Diagnosis not present

## 2024-01-27 DIAGNOSIS — Z6830 Body mass index (BMI) 30.0-30.9, adult: Secondary | ICD-10-CM | POA: Diagnosis not present

## 2024-01-27 DIAGNOSIS — I480 Paroxysmal atrial fibrillation: Secondary | ICD-10-CM | POA: Diagnosis not present

## 2024-01-27 DIAGNOSIS — M810 Age-related osteoporosis without current pathological fracture: Secondary | ICD-10-CM | POA: Diagnosis not present

## 2024-01-27 DIAGNOSIS — Z23 Encounter for immunization: Secondary | ICD-10-CM | POA: Diagnosis not present

## 2024-02-01 ENCOUNTER — Other Ambulatory Visit: Payer: Self-pay

## 2024-02-01 ENCOUNTER — Telehealth: Payer: Self-pay

## 2024-02-01 DIAGNOSIS — E785 Hyperlipidemia, unspecified: Secondary | ICD-10-CM

## 2024-02-01 DIAGNOSIS — I10 Essential (primary) hypertension: Secondary | ICD-10-CM | POA: Diagnosis not present

## 2024-02-01 DIAGNOSIS — Z9889 Other specified postprocedural states: Secondary | ICD-10-CM

## 2024-02-01 DIAGNOSIS — I2510 Atherosclerotic heart disease of native coronary artery without angina pectoris: Secondary | ICD-10-CM

## 2024-02-01 DIAGNOSIS — G4733 Obstructive sleep apnea (adult) (pediatric): Secondary | ICD-10-CM

## 2024-02-01 DIAGNOSIS — Q210 Ventricular septal defect: Secondary | ICD-10-CM

## 2024-02-01 DIAGNOSIS — I77810 Thoracic aortic ectasia: Secondary | ICD-10-CM

## 2024-02-01 DIAGNOSIS — M810 Age-related osteoporosis without current pathological fracture: Secondary | ICD-10-CM | POA: Diagnosis not present

## 2024-02-01 DIAGNOSIS — I480 Paroxysmal atrial fibrillation: Secondary | ICD-10-CM

## 2024-02-01 DIAGNOSIS — Z8546 Personal history of malignant neoplasm of prostate: Secondary | ICD-10-CM | POA: Diagnosis not present

## 2024-02-01 DIAGNOSIS — Z8679 Personal history of other diseases of the circulatory system: Secondary | ICD-10-CM

## 2024-02-01 DIAGNOSIS — I700 Atherosclerosis of aorta: Secondary | ICD-10-CM | POA: Diagnosis not present

## 2024-02-01 NOTE — Telephone Encounter (Signed)
 Patient came in to office, expressed that [employer] has not been in touch with him for his supply order. I apologized for the delay in therapy. Order placed for supplies through [employer]. Order has been received, per [employer].

## 2024-02-14 DIAGNOSIS — I700 Atherosclerosis of aorta: Secondary | ICD-10-CM | POA: Diagnosis not present

## 2024-02-14 DIAGNOSIS — I10 Essential (primary) hypertension: Secondary | ICD-10-CM | POA: Diagnosis not present

## 2024-03-02 DIAGNOSIS — M810 Age-related osteoporosis without current pathological fracture: Secondary | ICD-10-CM | POA: Diagnosis not present

## 2024-03-02 DIAGNOSIS — Z8546 Personal history of malignant neoplasm of prostate: Secondary | ICD-10-CM | POA: Diagnosis not present

## 2024-03-02 DIAGNOSIS — I10 Essential (primary) hypertension: Secondary | ICD-10-CM | POA: Diagnosis not present

## 2024-03-02 DIAGNOSIS — I700 Atherosclerosis of aorta: Secondary | ICD-10-CM | POA: Diagnosis not present

## 2024-03-15 DIAGNOSIS — I10 Essential (primary) hypertension: Secondary | ICD-10-CM | POA: Diagnosis not present

## 2024-03-15 DIAGNOSIS — I700 Atherosclerosis of aorta: Secondary | ICD-10-CM | POA: Diagnosis not present

## 2024-03-18 ENCOUNTER — Ambulatory Visit: Payer: Medicare Other | Attending: Cardiovascular Disease | Admitting: Cardiovascular Disease

## 2024-03-18 ENCOUNTER — Encounter: Payer: Self-pay | Admitting: Cardiovascular Disease

## 2024-03-18 DIAGNOSIS — I10 Essential (primary) hypertension: Secondary | ICD-10-CM | POA: Diagnosis not present

## 2024-03-18 DIAGNOSIS — Z8679 Personal history of other diseases of the circulatory system: Secondary | ICD-10-CM | POA: Insufficient documentation

## 2024-03-18 DIAGNOSIS — I480 Paroxysmal atrial fibrillation: Secondary | ICD-10-CM

## 2024-03-18 DIAGNOSIS — G4733 Obstructive sleep apnea (adult) (pediatric): Secondary | ICD-10-CM | POA: Diagnosis not present

## 2024-03-18 DIAGNOSIS — Z9889 Other specified postprocedural states: Secondary | ICD-10-CM | POA: Diagnosis not present

## 2024-03-18 DIAGNOSIS — C61 Malignant neoplasm of prostate: Secondary | ICD-10-CM | POA: Diagnosis not present

## 2024-03-18 DIAGNOSIS — Q210 Ventricular septal defect: Secondary | ICD-10-CM | POA: Diagnosis not present

## 2024-03-18 DIAGNOSIS — I2510 Atherosclerotic heart disease of native coronary artery without angina pectoris: Secondary | ICD-10-CM

## 2024-03-18 NOTE — Patient Instructions (Signed)
 Medication Instructions:  No changes *If you need a refill on your cardiac medications before your next appointment, please call your pharmacy*  Lab Work: No labs  Testing/Procedures: No testing  Follow-Up: At [facility], you and your health needs are our priority.  As part of our continuing mission to provide you with exceptional heart care, our providers are all part of one team.  This team includes your primary Cardiologist (physician) and Advanced Practice Providers or APPs (Physician Assistants and Nurse Practitioners) who all work together to provide you with the care you need, when you need it.  Your next appointment:   1 year(s)  Provider:   Gaylyn Keas MD  We recommend signing up for the patient portal called "MyChart".  Sign up information is provided on this After Visit Summary.  MyChart is used to connect with patients for Virtual Visits (Telemedicine).  Patients are able to view lab/test results, encounter notes, upcoming appointments, etc.  Non-urgent messages can be sent to your provider as well.   To learn more about what you can do with MyChart, go to [email or web address].

## 2024-03-18 NOTE — Progress Notes (Signed)
 Cardiology Office Note    Date:  03/20/2024   ID:  Jose Stone, DOB 17-Oct-1946, MRN [id number]  PCP:  Perley Bradley, MD  Cardiologist:  Magnus Schuller, MD (sleep); Dr. Maximo Spar  11 month F/U sleep evaluation  History of Present Illness:  Jose Stone is a 78 y.o. male who presents to the office for an 7-month follow-up sleep evaluation.  Jose Stone is followed by Dr. Maximo Spar for his primary cardiology care.  He has a history of previous documentation of sleep apnea over 10 years ago and apparently was on CPAP therapy for some time.  In August 2017, he underwent a reassessment of his sleep apnea with a sleep study and apparently on this study he was found to have increased upper airway resistance (U ARS) with an AHI of 2.5/h overall; however, there was moderate sleep apnea with REM sleep with an AHI of 26.7/h.  There was mild oxygen desaturation to a nadir of 86% and he snored with moderate snoring volume.  He also had increased periodic limb movement of sleep.  Apparently, because of his low AHI, he was not approved to resume CPAP therapy.  He is followed by Dr. Maximo Spar and has a history of a small VSD for which she has undergone periodic echocardiographic assessment.  A CT scan showed an ectatic root measuring 3.8 cm.  He also had a history of atrial fibrillation with RVR with spontaneously converted and has a history of hypertension and dyslipidemia.  He was referred for repeat sleep study due to worsening snoring witnessed by his wife.  His sleep study was done on April 29, 2019 and during that evaluation he met split-night criteria.  Other diagnostic portion of the study, AHI was 7.7 and his RDI was 14.5/h.  CPAP was implemented and was titrated up to an optimal pressure at 12 cm of water.  He had loud snoring volume during the diagnostic portion of the study.  Oxygen saturation nadir was 90%.  Jose Stone was set up with CPAP on June 23, 2019 with [facility] as his DME company.  A  download was obtained in the office  from August 22, 2019 through September 20, 2019 which confirms 100% usage.  He is averaging 6 hours and 17 minutes of CPAP use per night.  He states he typically goes to bed around 11 PM and oftentimes wakes up between 7 or 7:30 AM.  At a 12 cm water pressure, AHI is 5.0.  There are some nights with no leak but some nights with mild leak.  He has recently been trying the new ResMed F 30i mask which he believes is very comfortable and his leak is improved.  He states that at the beginning of the night, he does not feel like he is getting enough pressure.  He feels improved since reinitiating CPAP therapy with resolution of prior snoring.  He denies any daytime sleepiness.  An Epworth Sleepiness Scale was calculated in the office today and endorsed at 6 as shown below:  Epworth Sleepiness Scale: Situation   Chance of Dozing/Sleeping (0 = never , 1 = slight chance , 2 = moderate chance , 3 = high chance )   sitting and reading 1   watching TV 2   sitting inactive in a public place 0   being a passenger in a motor vehicle for an hour or more 0   lying down in the afternoon 3   sitting  and talking to someone 0   sitting quietly after lunch (no alcohol) 0   while stopped for a few minutes in traffic as the driver 0   Total Score  6   He was unaware of any bruxism, he denies painful restless legs, he denies any hypnagogic hallucinations or cataplectic events.  At his September 23, 2019 compliance evaluation he met compliance but in the early portion of the night he did not feel that he was getting adequate pressure.  At that time I discontinued his ramp time which was set at 45 minutes and changed him from a set pressure to an auto mode with a range of 10 to potentially 20 cm of water.  He was tolerating the new ResMed F 30i mask.  I saw him on October 18, 2020.  Over the prior year he had undergone rotator cuff surgery on his left shoulder and also had  atrial  fibrillation/atrial flutter ablation in September 2021 by Dr. Nunzio Belch.  He has continued to use CPAP with excellent compliance.  A new download from November 15 through October 16, 2020 shows 100% use with average use at 7 hours and 33 minutes.  His 95th percentile pressure is 12 with a maximum average pressure of 13.2.  AHI is excellent at 1.6.  He does not have any mask leak.  He is unaware of breakthrough snoring, bruxism, restless legs.  He denies daytime sleepiness.   I saw him on October 22, 2022.  Since his prior evaluation he had undergone stenting to his left circumflex coronary artery in May 2022.  He also has had issues with an infected left great toe currently on Augmentin .  He has had difficulty walking as result of his left toe infection.  He also was diagnosed with prostate CA and is to start radiation therapy at [facility].  Around Thanksgiving, he had developed a pneumothorax with partial collapse of his lung and had significant coughing and allergies.  He required supplemental O2.  As result, recent compliance with CPAP was suboptimal until December 6 when usage became more frequent.  His most recent download from November 23 to October 21, 2022 shows average use at 6 hours and 27 minutes usage was only 12 of 30 days.  His pressure is set at a 10 to 20 cm range and AHI is 4.9 with his 95th percentile pressure at 14.8 and maximum average pressure at 16.3.  During that evaluation I again discussed optimal sleep duration at 7 to 9 hours.   I last saw him on April 29, 2023.  Since his previous evaluation he had an ER evaluation for palpitations on Mar 04, 2023 and ECG showed PVCs.  He has continued to use CPAP therapy and is in need for new supplies.  Apparently [facility] is no longer in the CPAP business.  His wife uses [employer] for her DME company and he will need to be switched per his preference to [employer].  A download was obtained from May 28 through April 29, 2023.  Usage was 93% of days  and 1 night he essentially stayed up and did not go to sleep watching television.  He typically goes to bed between 11 PM and midnight and wakes up between 630 and 7 AM.  Average CPAP use is 5 hours and 36 minutes.  His device is set at a pressure range of 10 to 20 cm.  AHI is 3.7.  He was in need of a new mask and I  provided him with a new ResMed AirFit F30i mask.  I discussed optimal sleep duration at least 7 hours and made slight modification to his CPAP setting to a range of 11 to 20 cm of water.  Since I last saw him, he has continued to use CPAP therapy with excellent compliance.  [employer] is now his DME company.  A download from April 12 through Mar 13, 2024 shows 100% use.  Average use is 7 hours and 8 minutes.  He typically goes to bed between 11 and 1130 and wakes up around 7.  AHI is increased to 10.6 and his 95th percentile pressure was 16.4 with maximal average pressure 17.6.  He has nocturia 1 time per night.  He denies residual daytime sleepiness and Epworth scale was recalculated today and this endorsed at 7.  He presents for evaluation.   Past Medical History:  Diagnosis Date   Anemia    Aortic root enlargement (HCC)    Atrial fibrillation with RVR (HCC) 07/13/2018   CAD (coronary artery disease)    Complication of anesthesia    Hypotensive with general anesth.   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    Ventricular septal defect    VSD (ventricular septal defect)     Past Surgical History:  Procedure Laterality Date   ATRIAL FIBRILLATION ABLATION N/A 07/20/2020   Procedure: ATRIAL FIBRILLATION ABLATION;  Surgeon: Jolly Needle, MD;   Location: [facility];  Service: Cardiovascular;  Laterality: N/A;   BONE BIOPSY Left 12/24/2022   Procedure: BONE BIOPSY;  Surgeon: Evertt Hoe, DPM;  Location: [facility] ORS;  Service: Podiatry;  Laterality: Left;   CARDIAC CATHETERIZATION     CARDIOVERSION N/A 02/29/2020   Procedure: CARDIOVERSION;  Surgeon: Hazle Lites, MD;  Location: [facility] ENDOSCOPY;  Service: Cardiovascular;  Laterality: N/A;   CARDIOVERSION N/A 06/28/2020   Procedure: CARDIOVERSION;  Surgeon: Lake Pilgrim, MD;  Location: [facility] ENDOSCOPY;  Service: Cardiovascular;  Laterality: N/A;   CORONARY STENT INTERVENTION N/A 03/18/2021   Procedure: CORONARY STENT INTERVENTION;  Surgeon: Odie Benne, MD;  Location: [facility];  Service: Cardiovascular;  Laterality: N/A;   DISTAL INTERPHALANGEAL JOINT FUSION Left 01/19/2019   Procedure: HALLUX INTERPHANGEAL JOINT FUSION LEFT FOOT;  Surgeon: Charity Conch, DPM;  Location: [facility] OR;  Service: Podiatry;  Laterality: Left;  LEFT BLOCK   EXOSTECTECTOMY TOE Left 12/24/2022   Procedure: EXOSTECTECTOMY TOE, ARTHROPLASTY OF IPJ;  Surgeon: Evertt Hoe, DPM;  Location: [facility]S;  Service: Podiatry;  Laterality: Left;   HARDWARE REMOVAL Left 12/24/2022   Procedure: HARDWARE REMOVAL;  Surgeon: Evertt Hoe, DPM;  Location: [facility];  Service: Podiatry;  Laterality: Left;   hipospedious     LEFT HEART CATH AND CORONARY ANGIOGRAPHY N/A 03/18/2021   Procedure: LEFT HEART CATH AND CORONARY ANGIOGRAPHY;  Surgeon: Odie Benne, MD;  Location: [facility];  Service: Cardiovascular;  Laterality: N/A;   LEFT HEART CATHETERIZATION WITH CORONARY ANGIOGRAM N/A 08/14/2014   Procedure: LEFT HEART CATHETERIZATION WITH CORONARY ANGIOGRAM;  Surgeon: Markham Silence  Sharyn Deforest, MD;  Location: [facility] CATH LAB;  Service: Cardiovascular;  Laterality: N/A;   MASS EXCISION Left 01/19/2019   Procedure: EXCISION BENIGN LESION 2.OCM LEFT FOOT;  Surgeon: Charity Conch,  DPM;  Location: [facility] OR;  Service: Podiatry;  Laterality: Left;   SHOULDER ARTHROSCOPY WITH SUBACROMIAL DECOMPRESSION Left 05/03/2020   Procedure: SHOULDER ARTHROSCOPY DEBRIDEMENT PARTIAL ROTATOR CUFF REPAIR, SUBACROMIAL DECOMPRESSION, BICEPS TENOTOMY;  Surgeon: Sammye Cristal, MD;  Location: [facility] ORS;  Service: Orthopedics;  Laterality: Left;   SHOULDER SURGERY     TONSILLECTOMY  1953    Current Medications: Outpatient Medications Prior to Visit  Medication Sig Dispense Refill   acetaminophen  (TYLENOL ) 500 MG tablet Take 500-1,000 mg by mouth every 6 (six) hours as needed for mild pain or headache.     albuterol  (VENTOLIN  HFA) 108 (90 Base) MCG/ACT inhaler Inhale 2 puffs into the lungs every 6 (six) hours as needed for wheezing or shortness of breath.     alendronate (FOSAMAX) 70 MG tablet Take 70 mg by mouth every Wednesday. Take with a full glass of water on an empty stomach.     allopurinol  (ZYLOPRIM ) 100 MG tablet Take 200 mg by mouth in the morning.  0   atorvastatin  (LIPITOR ) 80 MG tablet TAKE 1 TABLET BY MOUTH DAILY AT 6 PM 90 tablet 3   COLCRYS  0.6 MG tablet Take 0.6 mg by mouth daily as needed (FOR GOUT FLARES).     ferrous sulfate 325 (65 FE) MG tablet Take 325 mg by mouth every evening.     ketotifen (ZADITOR) 0.025 % ophthalmic solution Place 1 drop into both eyes 2 (two) times daily.     Light Mineral Oil-Mineral Oil (RETAINE MGD) 0.5-0.5 % EMUL Place 1 drop into both eyes in the morning, at noon, and at bedtime.     loratadine  (CLARITIN ) 10 MG tablet Take 10 mg by mouth daily as needed for allergies.     Multiple Vitamins-Minerals (CENTRUM SILVER  50+MEN) TABS Take 1 tablet by mouth daily with breakfast.     nitroGLYCERIN  (NITROSTAT ) 0.4 MG SL tablet PLACE 1 TAB UNDER TONGUE EVERY 5 MINS AS NEEDED FOR CHEST PAIN,MAX 3 DOSES 25 tablet 5   Omega-3 Fatty Acids (OMEGA 3 PO) Take 3 capsules by mouth daily.     pantoprazole  (PROTONIX ) 40 MG tablet Take 1 tablet (40 mg total) by mouth daily  before breakfast. 90 tablet 3   sildenafil (REVATIO) 20 MG tablet Take 20 mg by mouth as needed for erectile dysfunction.     tamsulosin (FLOMAX) 0.4 MG CAPS capsule Take 0.4 mg by mouth daily.  2 times daily     valsartan  (DIOVAN ) 160 MG tablet TAKE 1 TABLET BY MOUTH EVERY DAY 90 tablet 2   vitamin B-12 (CYANOCOBALAMIN) 500 MCG tablet Take 500 mcg by mouth every evening.     XARELTO  20 MG TABS tablet TAKE 1 TABLET BY MOUTH DAILY WITH SUPPER 90 tablet 1   No facility-administered medications prior to visit.     Allergies:   Ciprofloxacin, Codeine , Diltiazem  cd [diltiazem  hcl], Mupirocin, Bacitra-neomycin-polymyxin-hc, Hydrocodone , Neosporin [bacitracin-polymyxin b], Neosporin [neomycin-bacitracin zn-polymyx], Oxycodone , and Tramadol hcl   Social History   Socioeconomic History   Marital status: Married    Spouse name: Not on file   Number of children: 2   Years of education: master's   Highest education level: Not on file  Occupational History   Occupation: civil Art gallery manager    Comment: retired  Tobacco Use   Smoking status: Never  Smokeless tobacco: Never  Vaping Use   Vaping status: Never Used  Substance and Sexual Activity   Alcohol use: No   Drug use: No   Sexual activity: Yes    Birth control/protection: None  Other Topics Concern   Not on file  Social History Narrative   Lives in [place] with spouse.   Retired Sport and exercise psychologist   Social Drivers of Corporate investment banker Strain: Not on [employer] Insecurity: Not on file  Transportation Needs: Not on file  Physical Activity: Not on file  Stress: Not on file  Social Connections: Not on file    Socially he is married for over 50 years.  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  He has 2 children ages 41 and 36 and grandchildren.  Family History:  The patient's family history includes Aortic aneurysm in his maternal grandmother; CAD in his paternal grandfather; Diabetes in his paternal grandfather;  Heart attack in his maternal grandfather; Pancreatic cancer in his mother.   His father died at age 29 with "old age. "His mother died at 80 and had pancreatic cancer.  He has 2 sisters.  ROS General: Negative; No fevers, chills, or night sweats;  HEENT: Negative; No changes in vision or hearing, sinus congestion, difficulty swallowing Pulmonary: Negative; No cough, wheezing, shortness of breath, hemoptysis Cardiovascular: History of small perimembranous VSD; PAF; hypertension, hyperlipidemia; status post AF/flutter ablation GI: Negative; No nausea, vomiting, diarrhea, or abdominal pain GU: Negative; No dysuria, hematuria, or difficulty voiding Musculoskeletal: Negative; no myalgias, joint pain, or weakness Hematologic/Oncology: Negative; no easy bruising, bleeding Endocrine: Negative; no heat/cold intolerance; no diabetes Neuro: Negative; no changes in balance, headaches Skin: Negative; No rashes or skin lesions Psychiatric: Negative; No behavioral problems, depression Sleep: See HPI Other comprehensive 14 point system review is negative.   PHYSICAL EXAM:   VS:  BP 128/78   Pulse (!) 59   Ht 6\' 2"  (1.88 m)   Wt 237 lb 12.8 oz (107.9 kg)   SpO2 97%   BMI 30.53 kg/m     Repeat blood pressure by me was 136/76  Wt Readings from Last 3 Encounters:  03/18/24 237 lb 12.8 oz (107.9 kg)  05/22/23 235 lb 6.4 oz (106.8 kg)  04/29/23 237 lb 3.2 oz (107.6 kg)   General: Alert, oriented, no distress.  Skin: normal turgor, no rashes, warm and dry HEENT: Normocephalic, atraumatic. Pupils equal round and reactive to light; sclera anicteric; extraocular muscles intact;  Nose without nasal septal hypertrophy Mouth/Parynx benign; Mallinpatti scale 3 Neck: No JVD, no carotid bruits; normal carotid upstroke Lungs: clear to ausculatation and percussion; no wheezing or rales Chest wall: without tenderness to palpitation Heart: PMI not displaced, RRR,  s1 s2 normal, 2-3/6 systolic murmur  secondary to his VSD;  no diastolic murmur, no rubs, gallops, thrills, or heaves Abdomen: soft, nontender; no hepatosplenomehaly, BS+; abdominal aorta nontender and not dilated by palpation. Back: no CVA tenderness Pulses 2+ Musculoskeletal: full range of motion, normal strength, no joint deformities Extremities: no clubbing cyanosis or edema, Homan's sign negative  Neurologic: grossly nonfocal; Cranial nerves grossly wnl Psychologic: Normal mood and affect     Studies/Labs Reviewed:   EKG Interpretation Date/Time:  Friday Mar 18 2024 09:29:17 EDT Ventricular Rate:  59 PR Interval:  156 QRS Duration:  110 QT Interval:  402 QTC Calculation: 397 R Axis:   -13  Text Interpretation: Sinus bradycardia with occasional Premature ventricular complexes and Premature atrial complexes  When compared with ECG of 29-Apr-2023 09:45, Premature atrial complexes are now Present Confirmed by Magnus Schuller ([id number]) on 03/18/2024 9:47:03 AM    April 29, 2023 ECG (independently read by me): Sinus bradycardia at 57 with mild sinus arrhythmia.  PVC do we have a download from him Camino from [place] she can get WhatsApp just just got RV here in  October 18, 2020  ECG (independently read by me): Sinus bradycardia at 52 bpm, PAC, incomplete right bundle branch block  September 23, 2019 ECG (independently read by me): Normal sinus rhythm with PAC mild RV conduction delay.  Normal intervals  Recent Labs:    Latest Ref Rng & Units 03/04/2023    6:52 PM 12/09/2022    2:19 PM 09/25/2022   12:44 AM  BMP  Glucose 70 - 99 mg/dL 604  540  981   BUN 8 - 23 mg/dL 28  21  21    Creatinine 0.61 - 1.24 mg/dL 1.91  4.78  2.95   Sodium 135 - 145 mmol/L 139  138  137   Potassium 3.5 - 5.1 mmol/L 4.1  4.3  3.7   Chloride 98 - 111 mmol/L 106  104  103   CO2 22 - 32 mmol/L 24  27  27    Calcium  8.9 - 10.3 mg/dL 9.2  8.9  8.9         Latest Ref Rng & Units 09/25/2022   12:44 AM 09/24/2022    3:26 PM 03/17/2021   12:10 AM   Hepatic Function  Total Protein 6.5 - 8.1 g/dL 5.8  6.3  5.8   Albumin 3.5 - 5.0 g/dL 3.2  3.4  3.4   AST 15 - 41 U/L 37  41  24   ALT 0 - 44 U/L 56  57  23   Alk Phosphatase 38 - 126 U/L 75  84  66   Total Bilirubin 0.3 - 1.2 mg/dL 0.5  0.7  0.8        Latest Ref Rng & Units 03/04/2023    6:25 PM 12/09/2022    2:19 PM 09/25/2022   12:44 AM  CBC  WBC 4.0 - 10.5 K/uL 6.4  6.5  7.7   Hemoglobin 13.0 - 17.0 g/dL 62.1  30.8  65.7   Hematocrit 39.0 - 52.0 % 44.4  44.7  42.1   Platelets 150 - 400 K/uL 189  178  174    Lab Results  Component Value Date   MCV 87.6 03/04/2023   MCV 92.0 12/09/2022   MCV 88.4 09/25/2022   Lab Results  Component Value Date   TSH 5.640 (H) 03/04/2023   Lab Results  Component Value Date   HGBA1C 5.5 12/09/2022     BNP No results found for: "BNP"  ProBNP    Component Value Date/Time   PROBNP 193.9 (H) 08/12/2014 1614     Lipid Panel     Component Value Date/Time   CHOL 144 05/27/2023 0815   CHOL 131 01/22/2015 0812   TRIG 88 05/27/2023 0815   TRIG 93 01/22/2015 0812   HDL 54 05/27/2023 0815   HDL 49 01/22/2015 0812   CHOLHDL 2.7 05/27/2023 0815   CHOLHDL 2.8 03/18/2021 0605   VLDL 10 03/18/2021 0605   LDLCALC 73 05/27/2023 0815   LDLCALC 63 01/22/2015 0812   LABVLDL 17 05/27/2023 0815     RADIOLOGY: No results found.    Additional studies/ records that were reviewed today include:  I reviewed the records  of Dr. Maximo Spar, the patient's 2017 sleep study, 2020 sleep study, and obtained a download from October 19 through September 20, 2019.  New download was obtained from September 17, 2020 through October 16, 2020. Records of Dr. Nunzio Belch were reviewed  New download was obtained from November 20 through October 21, 2022.  ASSESSMENT:    1. OSA on CPAP   2. Essential hypertension   3. VSD (ventricular septal defect)   4. CAD in native artery   5. PAF (paroxysmal atrial fibrillation) (HCC)   6. S/P ablation of atrial  fibrillation   7. 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 Maximo Spar and has a history of a small perimembranous VSD, hypertension, CAD, PAF on anticoagulation therapy, hyperlipidemia, and mild ectatic dilated aortic root.  Remotely he was diagnosed with sleep apnea over 12 years ago and for period of time was on CPAP therapy.  On his sleep study in 2017 he was found to have moderate sleep apnea during REM sleep but overall his AHI was only 2.5/h and he was not approved to reinstitute CPAP therapy.  A repeat evaluation in 2020 again confirmed obstructive sleep apnea.  CPAP therapy was resumed in August 2020 and when seen for initial compliance evaluation in November 2020 he was doing well.  However, his ramp time was prolonged and he was not feeling that he was getting adequate air in the early portion of the night.  As result I discontinued his ramp time.  I changed him to an auto mode.  His download from September 17, 2020 through October 16, 2020 showed excellent compliance.  With his pressure range from 10 up to 20 cm, AHI was excellent at 1.6.  His 95th percentile pressure is 12.0 and maximum average pressure 13.2.  He denies any residual daytime sleepiness or breakthrough snoring.  He is sleeping well.  He underwent successful rotator cuff surgery of his left shoulder and also underwent atrial fibrillation/flutter ablation in September by Dr. Nunzio Belch.  He has a documented VSD and also has left-sided varicose veins.  He has been using CPAP with excellent compliance.  However, his most recent download shows residual AHI at 10.3 with his pressure currently set an 11 to 20 cm pressure range.  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  His 95th percentile pressure is 16.4 with maximum average pressure  17.6.  As result, I am recommending we change his pressure setting to a range of 14 to 20 cm.  [employer] is his DME company.  He continues to see Perley Bradley, MD at [facility] for primary care.  Blood pressure today is stable and a repeat by me was 136/76 on valsartan  160 mg.  He is on atorvastatin  80 mg daily for lipid management and continues to be anticoagulated on Xarelto .  He will follow-up with Dr. Volpi for cardiology care.  I discussed my upcoming retirement.  I will transition him to the sleep care of Dr. Micael Adas with 1 year evaluation or sooner as needed.  Medication Adjustments/Labs and Tests Ordered: Current medicines are reviewed at length with the patient today.  Concerns regarding medicines are outlined above.  Medication changes, Labs and Tests ordered today are listed in the Patient Instructions below. Patient Instructions  Medication Instructions:  No changes *If you need a refill on your cardiac medications before your next appointment, please call your pharmacy*  Lab Work: No labs  Testing/Procedures: No testing  Follow-Up: At [facility], you and your health needs are our priority.  As part of our continuing mission to provide you with exceptional heart care, our providers are all part of one team.  This team includes your primary Cardiologist (physician) and Advanced Practice Providers or APPs (Physician Assistants and Nurse Practitioners) who all work together to provide you with the care you need, when you need it.  Your next appointment:   1 year(s)  Provider:   Gaylyn Keas MD  We recommend signing up for the patient portal called "MyChart".  Sign up information is provided on this After Visit Summary.  MyChart is used to connect with patients for Virtual Visits (Telemedicine).  Patients are able to view lab/test results, encounter notes, upcoming appointments, etc.  Non-urgent messages can be sent to your provider as well.   To learn more about what you can do with  MyChart, go to [email or web address].    Signed, Magnus Schuller, MD,FACC, ABSM Diplomate, American Board of Sleep Medicine  03/20/2024 4:42 PM    [facility] [address], [place], [place]  [address] Phone: [phone]

## 2024-03-20 ENCOUNTER — Encounter: Payer: Self-pay | Admitting: Cardiovascular Disease

## 2024-03-21 DIAGNOSIS — C61 Malignant neoplasm of prostate: Secondary | ICD-10-CM | POA: Diagnosis not present

## 2024-03-24 NOTE — Addendum Note (Signed)
 Addended by: Maceo Sax on: 03/24/2024 12:17 PM   Modules accepted: Orders

## 2024-04-01 DIAGNOSIS — H2513 Age-related nuclear cataract, bilateral: Secondary | ICD-10-CM | POA: Diagnosis not present

## 2024-04-02 DIAGNOSIS — Z8546 Personal history of malignant neoplasm of prostate: Secondary | ICD-10-CM | POA: Diagnosis not present

## 2024-04-02 DIAGNOSIS — I10 Essential (primary) hypertension: Secondary | ICD-10-CM | POA: Diagnosis not present

## 2024-04-02 DIAGNOSIS — M810 Age-related osteoporosis without current pathological fracture: Secondary | ICD-10-CM | POA: Diagnosis not present

## 2024-04-02 DIAGNOSIS — I700 Atherosclerosis of aorta: Secondary | ICD-10-CM | POA: Diagnosis not present

## 2024-04-14 DIAGNOSIS — I10 Essential (primary) hypertension: Secondary | ICD-10-CM | POA: Diagnosis not present

## 2024-04-14 DIAGNOSIS — I700 Atherosclerosis of aorta: Secondary | ICD-10-CM | POA: Diagnosis not present

## 2024-04-29 DIAGNOSIS — R059 Cough, unspecified: Secondary | ICD-10-CM | POA: Diagnosis not present

## 2024-04-29 DIAGNOSIS — R062 Wheezing: Secondary | ICD-10-CM | POA: Diagnosis not present

## 2024-04-29 DIAGNOSIS — R509 Fever, unspecified: Secondary | ICD-10-CM | POA: Diagnosis not present

## 2024-04-29 DIAGNOSIS — R053 Chronic cough: Secondary | ICD-10-CM | POA: Diagnosis not present

## 2024-04-29 DIAGNOSIS — Z03818 Encounter for observation for suspected exposure to other biological agents ruled out: Secondary | ICD-10-CM | POA: Diagnosis not present

## 2024-05-02 ENCOUNTER — Other Ambulatory Visit: Payer: Self-pay | Admitting: Internal Medicine

## 2024-05-02 DIAGNOSIS — Z8546 Personal history of malignant neoplasm of prostate: Secondary | ICD-10-CM | POA: Diagnosis not present

## 2024-05-02 DIAGNOSIS — I700 Atherosclerosis of aorta: Secondary | ICD-10-CM | POA: Diagnosis not present

## 2024-05-02 DIAGNOSIS — I10 Essential (primary) hypertension: Secondary | ICD-10-CM | POA: Diagnosis not present

## 2024-05-02 DIAGNOSIS — M810 Age-related osteoporosis without current pathological fracture: Secondary | ICD-10-CM | POA: Diagnosis not present

## 2024-05-08 ENCOUNTER — Other Ambulatory Visit: Payer: Self-pay | Admitting: Internal Medicine

## 2024-05-08 DIAGNOSIS — I480 Paroxysmal atrial fibrillation: Secondary | ICD-10-CM

## 2024-05-10 NOTE — Telephone Encounter (Signed)
 Prescription refill request for Xarelto  received.  Indication: A Fib Last office visit: 03/18/24 Weight: 237# Age:78 Scr: 0.86 07/27/23 care everywhere CrCl: 111ml/min

## 2024-05-14 DIAGNOSIS — I700 Atherosclerosis of aorta: Secondary | ICD-10-CM | POA: Diagnosis not present

## 2024-05-14 DIAGNOSIS — I10 Essential (primary) hypertension: Secondary | ICD-10-CM | POA: Diagnosis not present

## 2024-05-23 ENCOUNTER — Other Ambulatory Visit: Payer: Self-pay | Admitting: Internal Medicine

## 2024-05-24 ENCOUNTER — Encounter: Payer: Self-pay | Admitting: Internal Medicine

## 2024-05-24 ENCOUNTER — Ambulatory Visit: Attending: Internal Medicine | Admitting: Internal Medicine

## 2024-05-24 VITALS — BP 116/58 | HR 64 | Ht 73.0 in | Wt 233.4 lb

## 2024-05-24 DIAGNOSIS — I2510 Atherosclerotic heart disease of native coronary artery without angina pectoris: Secondary | ICD-10-CM | POA: Diagnosis not present

## 2024-05-24 DIAGNOSIS — E66811 Obesity, class 1: Secondary | ICD-10-CM | POA: Insufficient documentation

## 2024-05-24 DIAGNOSIS — I480 Paroxysmal atrial fibrillation: Secondary | ICD-10-CM | POA: Insufficient documentation

## 2024-05-24 DIAGNOSIS — G4733 Obstructive sleep apnea (adult) (pediatric): Secondary | ICD-10-CM | POA: Diagnosis not present

## 2024-05-24 MED ORDER — PANTOPRAZOLE SODIUM 40 MG PO TBEC: 40.0000 mg | DELAYED_RELEASE_TABLET | Freq: Every day | ORAL | 3 refills | Status: AC

## 2024-05-24 NOTE — Patient Instructions (Addendum)
 Medication Instructions:  Your physician recommends that you continue on your current medications as directed. Please refer to the Current Medication list given to you today.  *If you need a refill on your cardiac medications before your next appointment, please call your pharmacy*  Follow-Up: At [facility], you and your health needs are our priority.  As part of our continuing mission to provide you with exceptional heart care, our providers are all part of one team.  This team includes your primary Cardiologist (physician) and Advanced Practice Providers or APPs (Physician Assistants and Nurse Practitioners) who all work together to provide you with the care you need, when you need it.  Your next appointment:   1 year follow up with Dr. Mona  Other Instructions You have been referred to our Pharmacy clinic- they will reach out to you to schedule appointment

## 2024-05-24 NOTE — Progress Notes (Signed)
 OFFICE NOTE  Chief Complaint:  No complaints  Primary Care Physician: Cleotilde Planas, MD  HPI:  Jose Stone is a very pleasant 78 year-old male who is a former Sport and exercise psychologist. 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 This is never caused issues in fact she's had right and left heart catheterizations dating back to the 1970s. 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 Other coronary risk factors include hypertension, dyslipidemia and family history of heart disease. He was sent for cardiac catheterization in 2006 for follow-up of VSD, at which time he had an echocardiogram which showed mild aortic regurgitation, a small membranous VSD and left atrial enlargement with an EF of 70%. He had a stress test at that time which showed findings concerning for him for a septal and inferoapical ischemia. He therefore was referred to left heart catheterization with strep demonstrated a 50-70% mid LAD stenosis and mild disease of the mid circumflex and first obtuse marginal branches. He's been managed medically since that time and previously saw Dr. Victory Sharps.  Recently he's been having some symptoms of sweating, palpitations and dizziness and eventually was found to be in atrial fibrillation when he presented to the hospital. At that time his troponin was elevated. He is placed on diltiazem  which slowed his heart rate and he converted back to sinus spontaneously. Based on his elevated troponin, he was referred for left heart catheterization by Dr. Claudene. This demonstrated a 60-70% mid LAD stenosis and a 90% obtuse marginal stenosis. Subsequently he underwent coronary intervention with placement of a 2.5 x 15 mm Xience Alpine stent to the mid OM. After this he became asymptomatic. He was going to have stress testing to determine the significance of his mid LAD lesion however it appears to be not significantly changed since his heart  catheterization 2006. He reports no further symptoms of atrial fibrillation but is concerned about Jose Stone. He was not referred for cardiac rehabilitation due to the thought that he may need additional intervention to the LAD.  Jose Stone returns today for follow-up. She's done quite well in cardiac rehabilitation. At his last office visit we discontinued his diltiazem  for Jose Stone. He still has a low heart rate which has some degree of chronotropic incompetence. He reports no recurrent atrial fibrillation that were aware of. He continues on dual antiplatelet therapy. He had a stress test prior to enrolling in cardiac redilatation which was negative for ischemia.  I saw Jose Stone back in the office today. He is accompanied by his wife who was a cardiac nurse at the [employer] in [place]  for a number years in the 1970s. They brought in pronounced of his heart rate monitor and had several questions about his heart rate and arrhythmias. My interpretation of the monitor indicates that he had some episodes of short bursts of atrial tachycardia but no evidence for A. fib. He also has PACs. Heart rate however was a low and remained that way despite stopping his calcium  channel blocker. He was recently in the emergency room and was noted to have some paroxysmal tachycardia in the ER with a right bundle-branch pattern. This is what led to monitor placement. He reports fatigue which could be certainly related to his lower heart rate. He also says that he's developed gynecomastia which he relates to Brilinta . I've done a search on the medicine and do not see that however he reports in  the product labeling.  03/21/2016  Jose Stone was seen today in the office for an acute visit. He noted this morning shortly after waking up his heart rate was elevated in the low 100s. It improved somewhat and then around 9:00 it increased up to about 130. The heart rate is been stable since then and he called in for a new acute  triage visit. He reports feeling somewhat fatigued and noting that his heart rate is elevated. He denies any worsening shortness of breath or chest pain. EKG in the office shows atrial flutter with a regular rhythm at 131 bpm. As per recent notes he was previous on aspirin  and clopidogrel  but the caput ago was stopped. He was not on triple therapy. Currently he is only on low-dose aspirin . 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 I'm concerned about up titrating his medications because she's had history of slow ventricular rates in the past. We discussed a number of options but ultimately the best option is for him to undergo cardioversion to get back into normal rhythm.  04/17/2016  Jose Stone was seen back in the office today for follow-up. At his last office visit he was noted to be in atrial flutter with a rate of 131. He was referred directly to the ER and underwent cardioversion after being started on Xarelto . He is remained on Xarelto  since then without any bleeding problems. I'm happy to say he is maintaining sinus rhythm today. He does report significant fatigue however. He is mostly compliant with CPAP however he had a sleep study a number of years ago and he is set possibly to low of an airway setting. He's had weight gain since then. He also has been having some Jose Stone with heart rates in the 40s and 50s. He is on low-dose beta blocker but I been hesitant to stop that because of his RVR. He does report heart rate increases with exercise.   06/20/2016  Jose Stone returns today for follow-up. He underwent a sleep study on 06/05/2016. He did have a diagnosis of sleep apnea however that study indicated a very low AHI of 2.5 events per hour. He had actually about 25 events per hour in rem sleep, but overall the REM sleep latency was only about 8%. Ultimately he was diagnosis not having any significant apnea. This is led to some confusion as he previously was  wearing us  CPAP device. He reports no change in his symptoms of fatigue which we thought could be related to sleep apnea. It also may be related to Jose Stone and his beta blocker.  09/23/2016  Jose Stone comes back today and is doing well. His fatigue has improved somewhat with discontinuing his B-Blocker. Heartrate is increased. I spoke with Dr. Burnard about the possibility of OSA and he is not felt to have this. Dr. Burnard noted that RLS was present. He does not desire treatment for this. Blood pressure was somewhat elevated today. EKG shows sinus with PAC's. I reviewed a spreadsheet of his blood pressures today.  03/31/2017  Jose Stone was seen today in follow-up. Overall he is doing very well. He denies any chest pain worsening shortness of breath. Blood pressure is well-controlled. He brought in a graft indicating excellent control blood pressure and heart rate. He said no breakthrough A. fib. His recent lab work was provided by his PCP. Total cholesterol 134, trig Richards 85, HDL-C 49, LDL-C 68, non-HDL of 85. This represents excellent control. He is planning  a trip out [place] is been provided some Diamox to use as needed. He denies any bleeding problems on Xarelto .  09/03/2017  Jose Stone returns today for follow-up.  He underwent a recent stress test after I saw him in the emergency department for chest pain.  At the time he took some nitroglycerin  which resulted in improvement in his symptoms however ruled out for MI by negative troponins.  His stress test demonstrated a small area of reversible ischemia in the inferolateral wall.  It was interpreted as intermediate risk.  Since then he has had no further chest pain.  I discussed the findings with him today and we talked about various options including medical therapy and/or repeat cardiac catheterization.  As he remains asymptomatic, I do not feel that medication adjustments necessary at this time.  Finally, he notes that he has had some  morning hypertension which is more significant than his daytime readings.  10/15/2017  Jose Stone returns today for follow-up.  As previously noted he had a mildly abnormal stress test and was having some chest discomfort.  That is completely resolved.  We have moved around his medications a little bit but he notes that his blood pressure still remains elevated at times during the day.  Echo was performed on 10/01/2017 which showed LVEF that was higher at 60-65%, stable dilated aortic root at 4.4 cm and the ascending aorta measured 4.0 cm.  No regional wall motion abnormalities were noted.  01/15/2018  Jose Stone returns today for follow-up of his blood pressure.  He reports after adjusting his medicines that in general his blood pressures been well controlled.  He denies any recurrent episodes of dizziness or lightheadedness.  Recently had some testing of testosterone .  He has had a number of urologic issues and possible damage to blood flow to 1 of the testicles.  That coupled with age I suspect his lead to low testosterone .  Recently as low as the 200s.  His urologist Dr. Nieves, is recommending supplemental testosterone .  He is asking for my cardiac opinion whether this is safe to use.  07/19/2018  Jose Stone returns today for hospital follow-up.  Fortunately was seen in the ER for acute onset A. fib with RVR.  This is in the setting of what appears to be marked dehydration.  He was working outside in the heat and developed acute renal failure.  Potassium was up to 6.1.  He responded to hydration and repeat renal profile last week showed that his creatinine returned to baseline.  Potassium was actually slightly low at 3.3.  He is not on repletion.  I stopped his chlorthalidone  and reduced his ARB to 160 mg valsartan  daily.  He denies any recurrent A. Fib.  11/23/2018  Jose Stone seen today for labile blood pressures.  He recently messaged the office and was noted to have elevated blood pressures.   Previously had been on higher dose blood pressure medication however this was reduced including stopping chlorthalidone  and reducing his ARB because of hyperkalemia.  The decrease in ARB probably was the reason why his blood pressures have remained elevated.  His wife is also concerned about a wider pulse pressure which at times can be close to 100 mmHg.  Despite this diastolics have not been below 50.  EKG shows sinus rhythm with sinus arrhythmia incomplete right bundle branch today at 66.  02/22/2020  Jose Stone seen today in follow-up.  Overall he seems to be doing well.  He had some recent  surgery including a brow/lid lift.  He is also had a toe fusion.  Of note, he was noted to be tachycardic today and he seemed to be unaware of this.  EKG was performed which showed A. fib with RVR.  He has had this in the past and is spontaneously converted, in fact once we set him up for cardioversion.  He is on Xarelto  and low-dose diltiazem .  He is scheduled to have an MRI tomorrow of his shoulder with a plan for upcoming shoulder surgery.  05/10/2020  Jose Stone seen today in follow-up.  EKG shows he is maintaining sinus rhythm.  He underwent successful left shoulder surgery.  He is slow to recover from this and still has some pain.  Blood pressure is elevated today.  He was off of Xarelto  for short period of time but then restarted it.  I also readjusted his medications including increasing his diltiazem  and decreasing the valsartan .  If possible we may need to go back up on the valsartan  however I would like for him to monitor his blood pressure.  07/19/2021  Jose Stone is seen today in follow-up.  He seems to be doing well still after PCI in May of this year.  He is transition from aspirin  Plavix  and Xarelto  to Plavix  and Xarelto  which we will continue at least until May 2023.  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  He does have some aortic insufficiency which we will continue to follow.  He will be due for repeat CT again next year.  He denies any chest pain.  EKG shows sinus Jose Stone with some PVCs today.  Blood pressure is well controlled.  Lipids are at goal as of May showing total cholesterol 104, HDL 37, triglycerides 50 and LDL 57.  04/22/2022  Jose Stone returns today for follow-up.  Overall he is doing well.  He had a repeat CT scan in May of this year which showed an ectatic aorta measuring 4 cm at the root and the ascending aorta only measured 3.8 cm.  Therefore no evidence of any significant aneurysm.  He denies chest pain or shortness of breath.  EKG today is stable with a sinus Jose Stone.  Blood pressure is well controlled.  He is due for repeat lipids which were last assessed in May 2022.  05/22/2023  Jose Stone reports has had an eventful year.  Unfortunately was diagnosed with prostate cancer but has undergone radiation therapy at [facility].  He is also had an issue with a toe infection and had some surgery.  He was seen in the emergency department for palpitations in May and felt that he had breakthrough A-fib but was not noted to be in A-fib when he was there.  He had a recent repeat EKG which showed sinus rhythm.  He was seen by Dr. Burnard and has had some new CPAP equipment ordered.  Blood pressure remains well-controlled.  A repeat CT scan of the aorta showed borderline aortic ectasia with a measurement of about 38 to 39 mm.  This is not considered dilated.  I would not necessarily think he needs repeat imaging next year.  05/24/2024  Mr. Mcglinchey is seen today in follow-up.  He recently saw Dr. Burnard for follow-up of sleep apnea and will likely transition to Dr. Shlomo after Dr. Joesphine retirement.  Overall he seems to be doing well  with CPAP.  He had a repeat CT scan as mentioned above showing aortic ectasia but no dilatation.  He denies any chest pain or shortness of breath.  He has been  having some difficulty losing weight.  He inquired as to whether or not he may be candidate for any of the newer weight loss medications.  We discussed the possibility of Zepbound since that has indication for sleep apnea and obesity.  He also does have a history of coronary artery disease and prior MI which may also qualify him for [facility].  PMHx:  Past Medical History:  Diagnosis Date   Anemia    Aortic root enlargement (HCC)    Atrial fibrillation with RVR (HCC) 07/13/2018   CAD (coronary artery disease)    Complication of anesthesia    Hypotensive with general anesth.   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    Ventricular septal defect    VSD (ventricular septal defect)     Past Surgical History:  Procedure Laterality Date   ATRIAL FIBRILLATION ABLATION N/A 07/20/2020   Procedure: ATRIAL FIBRILLATION ABLATION;  Surgeon: Kelsie Agent, MD;  Location: [facility] INVASIVE CV LAB;  Service: Cardiovascular;  Laterality: N/A;   BONE BIOPSY Left 12/24/2022   Procedure: BONE BIOPSY;  Surgeon: Malvin Marsa FALCON, DPM;  Location: [facility] ORS;  Service: Podiatry;  Laterality: Left;   CARDIAC CATHETERIZATION     CARDIOVERSION N/A 02/29/2020   Procedure: CARDIOVERSION;  Surgeon: Mona Vinie BROCKS, MD;  Location: [facility] ENDOSCOPY;  Service: Cardiovascular;  Laterality: N/A;   CARDIOVERSION N/A 06/28/2020   Procedure: CARDIOVERSION;  Surgeon: Alveta Aleene PARAS, MD;  Location: [facility] ENDOSCOPY;  Service: Cardiovascular;  Laterality: N/A;   CORONARY STENT INTERVENTION N/A 03/18/2021   Procedure: CORONARY STENT INTERVENTION;  Surgeon: Verlin Lonni BIRCH, MD;  Location: [facility]  INVASIVE CV LAB;  Service: Cardiovascular;  Laterality: N/A;   DISTAL INTERPHALANGEAL JOINT FUSION Left 01/19/2019   Procedure: HALLUX INTERPHANGEAL JOINT FUSION LEFT FOOT;  Surgeon: Gershon Donnice SAUNDERS, DPM;  Location: [facility] OR;  Service: Podiatry;  Laterality: Left;  LEFT BLOCK   EXOSTECTECTOMY TOE Left 12/24/2022   Procedure: EXOSTECTECTOMY TOE, ARTHROPLASTY OF IPJ;  Surgeon: Malvin Marsa FALCON, DPM;  Location: [facility] ORS;  Service: Podiatry;  Laterality: Left;   HARDWARE REMOVAL Left 12/24/2022   Procedure: HARDWARE REMOVAL;  Surgeon: Malvin Marsa FALCON, DPM;  Location: [facility] ORS;  Service: Podiatry;  Laterality: Left;   hipospedious     LEFT HEART CATH AND CORONARY ANGIOGRAPHY N/A 03/18/2021   Procedure: LEFT HEART CATH AND CORONARY ANGIOGRAPHY;  Surgeon: Verlin Lonni BIRCH, MD;  Location: [facility] INVASIVE CV LAB;  Service: Cardiovascular;  Laterality: N/A;   LEFT HEART CATHETERIZATION WITH CORONARY ANGIOGRAM N/A 08/14/2014   Procedure: LEFT HEART CATHETERIZATION WITH CORONARY ANGIOGRAM;  Surgeon: Salena GORMAN Negri, MD;  Location: [facility] CATH LAB;  Service: Cardiovascular;  Laterality: N/A;   MASS EXCISION Left 01/19/2019   Procedure: EXCISION BENIGN LESION 2.OCM LEFT FOOT;  Surgeon: Gershon Donnice SAUNDERS, DPM;  Location: [facility] OR;  Service: Podiatry;  Laterality: Left;   SHOULDER ARTHROSCOPY WITH  SUBACROMIAL DECOMPRESSION Left 05/03/2020   Procedure: SHOULDER ARTHROSCOPY DEBRIDEMENT PARTIAL ROTATOR CUFF REPAIR, SUBACROMIAL DECOMPRESSION, BICEPS TENOTOMY;  Surgeon: Dozier Soulier, MD;  Location: [facility] ORS;  Service: Orthopedics;  Laterality: Left;   SHOULDER SURGERY     TONSILLECTOMY  1955    FAMHx:  Family History  Problem Relation Age of Onset   Pancreatic cancer Mother    Aortic aneurysm Maternal Grandmother    Heart attack Maternal Grandfather    Diabetes Paternal Grandfather    CAD Paternal Grandfather     SOCHx:   reports that he has never smoked. He has never used smokeless tobacco. He reports  that he does not drink alcohol and does not use drugs.  ALLERGIES:  Allergies  Allergen Reactions   Ciprofloxacin Other (See Comments)    Aggravates Bulging aorta   Codeine  Nausea Only and Other (See Comments)    Can tolerate with benadryl   Diltiazem  Cd [Diltiazem  Hcl] Other (See Comments)    Jose Stone   Mupirocin Itching   Bacitra-Neomycin-Polymyxin-Hc Rash   Hydrocodone  Rash and Other (See Comments)    Can tolerate with benadryl   Neosporin [Bacitracin-Polymyxin B] Rash   Neosporin [Neomycin-Bacitracin Zn-Polymyx] Rash   Oxycodone  Rash and Other (See Comments)    Can tolerate with benadryl   Tramadol Hcl Rash    ROS: Pertinent items noted in HPI and remainder of comprehensive ROS otherwise negative.  HOME MEDS: Current Outpatient Medications  Medication Sig Dispense Refill   acetaminophen  (TYLENOL ) 500 MG tablet Take 500-1,000 mg by mouth every 6 (six) hours as needed for mild pain or headache.     albuterol  (VENTOLIN  HFA) 108 (90 Base) MCG/ACT inhaler Inhale 2 puffs into the lungs every 6 (six) hours as needed for wheezing or shortness of breath.     alendronate (FOSAMAX) 70 MG tablet Take 70 mg by mouth every Wednesday. Take with a full glass of water on an empty stomach.     allopurinol  (ZYLOPRIM ) 100 MG tablet Take 200 mg by mouth in the morning.  0   atorvastatin  (LIPITOR ) 80 MG tablet TAKE 1 TABLET BY MOUTH DAILY AT 6 PM 90 tablet 3   COLCRYS  0.6 MG tablet Take 0.6 mg by mouth daily as needed (FOR GOUT FLARES).     ferrous sulfate 325 (65 FE) MG tablet Take 325 mg by mouth every evening.     ketotifen (ZADITOR) 0.025 % ophthalmic solution Place 1 drop into both eyes 2 (two) times daily.     Light Mineral Oil-Mineral Oil (RETAINE MGD) 0.5-0.5 % EMUL Place 1 drop into both eyes in the morning, at noon, and at bedtime.     loratadine  (CLARITIN ) 10 MG tablet Take 10 mg by mouth daily as needed for allergies.     Multiple Vitamins-Minerals (CENTRUM SILVER  50+MEN) TABS Take  1 tablet by mouth daily with breakfast.     nitroGLYCERIN  (NITROSTAT ) 0.4 MG SL tablet PLACE 1 TAB UNDER TONGUE EVERY 5 MINS AS NEEDED FOR CHEST PAIN,MAX 3 DOSES 25 tablet 5   Omega-3 Fatty Acids (OMEGA 3 PO) Take 3 capsules by mouth daily.     pantoprazole  (PROTONIX ) 40 MG tablet TAKE 1 TABLET BY MOUTH DAILY BEFORE BREAKFAST 30 tablet 0   sildenafil (REVATIO) 20 MG tablet Take 20 mg by mouth as needed for erectile dysfunction.     tamsulosin (FLOMAX) 0.4 MG CAPS capsule Take 0.4 mg by mouth daily.  2 times daily     valsartan  (DIOVAN ) 160 MG tablet TAKE 1 TABLET  BY MOUTH EVERY DAY 90 tablet 2   vitamin B-12 (CYANOCOBALAMIN) 500 MCG tablet Take 500 mcg by mouth every evening.     XARELTO  20 MG TABS tablet TAKE 1 TABLET BY MOUTH DAILY WITH SUPPER 90 tablet 1   No current facility-administered medications for this visit.    LABS/IMAGING: No results found for this or any previous visit (from the past 48 hours). No results found.  VITALS: BP (!) 116/58   Pulse 64   Ht 6' 1 (1.854 m)   Wt 233 lb 6.4 oz (105.9 kg)   SpO2 98%   BMI 30.79 kg/m   EXAM: General appearance: alert and no distress Neck: no carotid bruit, no JVD, and thyroid  not enlarged, symmetric, no tenderness/mass/nodules Lungs: clear to auscultation bilaterally Heart: regular rate and rhythm Abdomen: soft, non-tender; bowel sounds normal; no masses,  no organomegaly Extremities: extremities normal, atraumatic, no cyanosis or edema Pulses: 2+ and symmetric Skin: Skin color, texture, turgor normal. No rashes or lesions Neurologic: Grossly normal Psych: Pleasant  EKG: Deferred  ASSESSMENT: A. 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   Mr. Jantz is doing fairly well.  He has inquired about possible weight loss medications.  Given his history of coronary artery disease and sleep apnea there are several possible options that he may qualify for.  I will refer him to our Pharm.D. clinic to see if they can help him with approval for either [facility] or Zepbound.  He is maintaining sinus rhythm.  Blood pressure is well-controlled.  His lipids have been at goal.  He is aorta was noted to be mostly ectatic but not dilated.  He recently saw Dr. Burnard for sleep apnea which is well-controlled.  Follow-up with me annually or sooner as necessary.  Vinie KYM Maxcy, MD, [facility], FNLA, FACP  [place]  [facility]  Medical Director of the Advanced Lipid Disorders &  Cardiovascular Risk Reduction Clinic Diplomate of the American Board of Clinical Lipidology Attending Cardiologist  Direct Dial: [phone]  Fax: [phone]  Website:  [email or web address]   Vinie BROCKS Melvina Pangelinan 05/24/2024, 1:14 PM

## 2024-06-02 DIAGNOSIS — I700 Atherosclerosis of aorta: Secondary | ICD-10-CM | POA: Diagnosis not present

## 2024-06-02 DIAGNOSIS — M810 Age-related osteoporosis without current pathological fracture: Secondary | ICD-10-CM | POA: Diagnosis not present

## 2024-06-02 DIAGNOSIS — Z8546 Personal history of malignant neoplasm of prostate: Secondary | ICD-10-CM | POA: Diagnosis not present

## 2024-06-02 DIAGNOSIS — I10 Essential (primary) hypertension: Secondary | ICD-10-CM | POA: Diagnosis not present

## 2024-06-13 DIAGNOSIS — I700 Atherosclerosis of aorta: Secondary | ICD-10-CM | POA: Diagnosis not present

## 2024-06-13 DIAGNOSIS — I10 Essential (primary) hypertension: Secondary | ICD-10-CM | POA: Diagnosis not present

## 2024-07-03 DIAGNOSIS — I700 Atherosclerosis of aorta: Secondary | ICD-10-CM | POA: Diagnosis not present

## 2024-07-03 DIAGNOSIS — Z8546 Personal history of malignant neoplasm of prostate: Secondary | ICD-10-CM | POA: Diagnosis not present

## 2024-07-03 DIAGNOSIS — I10 Essential (primary) hypertension: Secondary | ICD-10-CM | POA: Diagnosis not present

## 2024-07-03 DIAGNOSIS — M810 Age-related osteoporosis without current pathological fracture: Secondary | ICD-10-CM | POA: Diagnosis not present

## 2024-07-06 ENCOUNTER — Telehealth: Payer: Self-pay | Admitting: Pharmacist

## 2024-07-06 ENCOUNTER — Telehealth: Payer: Self-pay | Admitting: Pharmacy Technician

## 2024-07-06 ENCOUNTER — Other Ambulatory Visit (HOSPITAL_COMMUNITY): Payer: Self-pay

## 2024-07-06 ENCOUNTER — Encounter: Payer: Self-pay | Admitting: Pharmacist

## 2024-07-06 ENCOUNTER — Ambulatory Visit: Attending: Cardiovascular Disease | Admitting: Pharmacist

## 2024-07-06 VITALS — Ht 73.0 in | Wt 233.0 lb

## 2024-07-06 DIAGNOSIS — E6609 Other obesity due to excess calories: Secondary | ICD-10-CM | POA: Insufficient documentation

## 2024-07-06 NOTE — Progress Notes (Signed)
 Patient ID: [name]                 DOB: 07-16-1946                    MRN: [id number]     HPI: Jose Stone is a 78 y.o. male patient referred to pharmacy clinic by Dr. Mona  to initiate GLP1-RA therapy. PMH is significant for CAD, MI-2015, HLD,Afib,HTN, OSA and obesity. Most recent BMI 30.8 kg/m .  Baseline weight and BMI: 237 lbs 30.9 kg/m  Current weight and BMI: 233 lbs 30.8 kg/m  Current meds that affect weight: none Pt was involved with [place] weight loss program (lifestyle intervention): Achieved weight loss (248 to 220 lbs) Goal weight: 217 lbs   Diet:  Breakfast: heart healthy, nuts, dates  banana Lunch: [place] sandwich, whole grain bread, yogurt, apple Dinner: meat of some sort, pasta, pizza - once a week  Drink: water, V8 juices Snacks: cheese sandwiches, walnuts  Exercise:  No structured exercise due to arthritis Pt was going to PT 2x/week and willing to restart those exercises along with walking 20 min most days of the week  Yard work and dog walking   Family History:  Relation Problem Comments  Mother (Deceased at age 88) Pancreatic cancer     Father (Deceased at age 28)   Maternal Grandmother (Deceased at age 36) Aortic aneurysm     Maternal Grandfather (Deceased at age 39) Heart attack     Paternal Grandmother (Deceased at age 50)   Paternal Grandfather (Deceased at age 30) CAD   Diabetes      Social History: Smoking: none since 2015 Alcohol: none since 2015   Labs: Lab Results  Component Value Date   HGBA1C 5.5 12/09/2022    Wt Readings from Last 1 Encounters:  05/24/24 233 lb 6.4 oz (105.9 kg)    BP Readings from Last 1 Encounters:  05/24/24 (!) 116/58   Pulse Readings from Last 1 Encounters:  05/24/24 64       Component Value Date/Time   CHOL 144 05/27/2023 0815   CHOL 131 01/22/2015 0812   TRIG 88 05/27/2023 0815   TRIG 93 01/22/2015 0812   HDL 54 05/27/2023 0815   HDL 49 01/22/2015 0812   CHOLHDL 2.7 05/27/2023  0815   CHOLHDL 2.8 03/18/2021 0605   VLDL 10 03/18/2021 0605   LDLCALC 73 05/27/2023 0815   LDLCALC 63 01/22/2015 0812    Past Medical History:  Diagnosis Date   Anemia    Aortic root enlargement (HCC)    Atrial fibrillation with RVR (HCC) 07/13/2018   CAD (coronary artery disease)    Complication of anesthesia    Hypotensive with general anesth.   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     albuterol  (VENTOLIN  HFA) 108 (90 Base) MCG/ACT inhaler Inhale 2 puffs into the lungs every 6 (six) hours as needed for wheezing or shortness of breath.     alendronate (FOSAMAX) 70 MG tablet Take 70 mg by mouth every Wednesday. Take with a full glass of water on an empty stomach.     allopurinol  (ZYLOPRIM ) 100 MG tablet Take 200 mg by mouth in the morning.  0   atorvastatin  (LIPITOR ) 80 MG tablet TAKE 1 TABLET BY MOUTH DAILY AT 6 PM 90 tablet 3   COLCRYS  0.6 MG tablet Take 0.6 mg by mouth daily as needed (FOR GOUT FLARES).     ferrous sulfate 325 (65 FE) MG tablet Take 325 mg by mouth every evening.     ketotifen (ZADITOR) 0.025 % ophthalmic solution Place 1 drop into both eyes 2 (two) times daily.     Light Mineral Oil-Mineral Oil (RETAINE MGD) 0.5-0.5 % EMUL Place 1 drop into both eyes in the morning, at noon, and at bedtime.     loratadine  (CLARITIN ) 10 MG tablet Take 10 mg by  mouth daily as needed for allergies.     Multiple Vitamins-Minerals (CENTRUM SILVER  50+MEN) TABS Take 1 tablet by mouth daily with breakfast.     nitroGLYCERIN  (NITROSTAT ) 0.4 MG SL tablet PLACE 1 TAB UNDER TONGUE EVERY 5 MINS AS NEEDED FOR CHEST PAIN,MAX 3 DOSES 25 tablet 5   Omega-3 Fatty Acids (OMEGA 3 PO) Take 3 capsules by mouth daily.     pantoprazole  (PROTONIX ) 40 MG tablet Take 1 tablet (40 mg total) by mouth daily before breakfast. 90 tablet 3   sildenafil (REVATIO) 20 MG tablet Take 20 mg by mouth as needed for erectile dysfunction.     tamsulosin (FLOMAX) 0.4 MG CAPS capsule Take 0.4 mg by mouth daily.  2 times daily     valsartan  (DIOVAN ) 160 MG tablet TAKE 1 TABLET BY MOUTH EVERY DAY 90 tablet 2   vitamin B-12 (CYANOCOBALAMIN) 500 MCG tablet Take 500 mcg by mouth every evening.     XARELTO  20 MG TABS tablet TAKE 1 TABLET BY MOUTH DAILY WITH SUPPER 90 tablet 1   No current facility-administered medications on file prior to visit.    Allergies  Allergen Reactions   Ciprofloxacin Other (See Comments)    Aggravates Bulging aorta   Codeine  Nausea Only and Other (See Comments)    Can tolerate with benadryl   Diltiazem  Cd [Diltiazem  Hcl] Other (See Comments)    Bradycardia   Mupirocin Itching   Bacitra-Neomycin-Polymyxin-Hc Rash   Hydrocodone  Rash and Other (See Comments)    Can tolerate with benadryl   Neosporin [Bacitracin-Polymyxin B] Rash   Neosporin [Neomycin-Bacitracin Zn-Polymyx] Rash   Oxycodone  Rash and Other (See Comments)    Can tolerate with benadryl   Tramadol Hcl Rash     Assessment/Plan:  1. Weight loss - Patient has not met goal of at least 5% of body weight loss with comprehensive lifestyle modifications alone in the past 3-6 months. Pharmacotherapy is appropriate to pursue as augmentation. Will start Wegovy  considering PMH of MI (OSA with AHI <15 - insurance may not cover Zepbound). Confirmed patient has no personal or family history of medullary thyroid   carcinoma (MTC), pancreatitis or Multiple Endocrine Neoplasia syndrome type 2 (MEN 2). Injection technique reviewed at today's visit.  Advised patient on common side effects including nausea, diarrhea, dyspepsia, decreased appetite, and fatigue. Counseled patient on reducing meal size and  how to titrate medication to minimize side effects. Counseled patient to call if intolerable side effects or if experiencing dehydration, abdominal pain, or dizziness. Along with pharmacotherapy, the patient will follow dietary modifications and aim for at least 150 minutes of moderate-intensity exercise per week, plus resistance training twice a week (as recommended by the American Heart Association). This resistance training--such as weightlifting, bodyweight exercises, or using resistance bands, adapted to the patient's ability--will help prevent muscle loss.  Follow up in 1-2 days regarding coverage of Wegovy . If therapy is initiated, phone follow-ups will be conducted every 4 weeks for dose titration until the patient reaches the effective therapeutic dose and target weight.  Robbi Blanch, Pharm.D [facility] [address], [place], [place] [address] Phone: [phone]; Fax: [phone]

## 2024-07-06 NOTE — Telephone Encounter (Signed)
 Pharmacy Patient Advocate Encounter   Received notification from Pt Calls Messages that prior authorization for wegovy  0.25mg  is required/requested.   Insurance verification completed.   The patient is insured through [employer] .   Per test claim: PA required; PA submitted to above mentioned insurance via Latent Key/confirmation #/EOC [id number] Status is pending

## 2024-07-07 ENCOUNTER — Other Ambulatory Visit (HOSPITAL_COMMUNITY): Payer: Self-pay

## 2024-07-07 MED ORDER — WEGOVY 0.25 MG/0.5ML ~~LOC~~ SOAJ: 0.2500 mg | SUBCUTANEOUS | 0 refills | Status: DC

## 2024-07-07 NOTE — Telephone Encounter (Signed)
 Pharmacy Patient Advocate Encounter  Received notification from [employer] that Prior Authorization for wegovy  has been APPROVED from 07/07/24 to 07/07/25. Ran test claim, Copay is $241.16 one month. This test claim was processed through [facility]- copay amounts may vary at other pharmacies due to pharmacy/plan contracts, or as the patient moves through the different stages of their insurance plan.   PA #/Case ID/Reference #: [id number]

## 2024-07-13 DIAGNOSIS — I10 Essential (primary) hypertension: Secondary | ICD-10-CM | POA: Diagnosis not present

## 2024-07-13 DIAGNOSIS — I700 Atherosclerosis of aorta: Secondary | ICD-10-CM | POA: Diagnosis not present

## 2024-07-28 DIAGNOSIS — Z23 Encounter for immunization: Secondary | ICD-10-CM | POA: Diagnosis not present

## 2024-07-28 DIAGNOSIS — Z0000 Encounter for general adult medical examination without abnormal findings: Secondary | ICD-10-CM | POA: Diagnosis not present

## 2024-07-28 DIAGNOSIS — Z1331 Encounter for screening for depression: Secondary | ICD-10-CM | POA: Diagnosis not present

## 2024-07-29 NOTE — Telephone Encounter (Signed)
 PA for Wegovy  approved see other encounter for more info.

## 2024-08-02 ENCOUNTER — Other Ambulatory Visit: Payer: Self-pay | Admitting: Internal Medicine

## 2024-08-02 DIAGNOSIS — I10 Essential (primary) hypertension: Secondary | ICD-10-CM | POA: Diagnosis not present

## 2024-08-02 DIAGNOSIS — R7303 Prediabetes: Secondary | ICD-10-CM | POA: Diagnosis not present

## 2024-08-02 DIAGNOSIS — Z8546 Personal history of malignant neoplasm of prostate: Secondary | ICD-10-CM | POA: Diagnosis not present

## 2024-08-02 DIAGNOSIS — M1A9XX0 Chronic gout, unspecified, without tophus (tophi): Secondary | ICD-10-CM | POA: Diagnosis not present

## 2024-08-02 DIAGNOSIS — Z23 Encounter for immunization: Secondary | ICD-10-CM | POA: Diagnosis not present

## 2024-08-02 DIAGNOSIS — I480 Paroxysmal atrial fibrillation: Secondary | ICD-10-CM | POA: Diagnosis not present

## 2024-08-02 DIAGNOSIS — M810 Age-related osteoporosis without current pathological fracture: Secondary | ICD-10-CM | POA: Diagnosis not present

## 2024-08-02 DIAGNOSIS — Z6829 Body mass index (BMI) 29.0-29.9, adult: Secondary | ICD-10-CM | POA: Diagnosis not present

## 2024-08-02 DIAGNOSIS — E785 Hyperlipidemia, unspecified: Secondary | ICD-10-CM | POA: Diagnosis not present

## 2024-08-02 DIAGNOSIS — K219 Gastro-esophageal reflux disease without esophagitis: Secondary | ICD-10-CM | POA: Diagnosis not present

## 2024-08-02 DIAGNOSIS — I700 Atherosclerosis of aorta: Secondary | ICD-10-CM | POA: Diagnosis not present

## 2024-08-02 DIAGNOSIS — E663 Overweight: Secondary | ICD-10-CM | POA: Diagnosis not present

## 2024-08-03 MED ORDER — WEGOVY 0.5 MG/0.5ML ~~LOC~~ SOAJ: 0.5000 mg | SUBCUTANEOUS | 0 refills | Status: DC

## 2024-08-03 NOTE — Addendum Note (Signed)
 Addended by: Kortez Murtagh K on: 08/03/2024 02:03 PM   Modules accepted: Orders

## 2024-08-03 NOTE — Telephone Encounter (Signed)
 Patient called reports he has lost ~6 lbs so far on 1st dose Wegovy . Finally able to tolerate 3rd dose well. Will take the last dose this weekend. Prescription for Wegovy  0.5 sent to the pharmacy. Patient to call us  back in 4 weeks for further dose titration.

## 2024-08-12 DIAGNOSIS — I10 Essential (primary) hypertension: Secondary | ICD-10-CM | POA: Diagnosis not present

## 2024-08-12 DIAGNOSIS — I700 Atherosclerosis of aorta: Secondary | ICD-10-CM | POA: Diagnosis not present

## 2024-09-02 DIAGNOSIS — I10 Essential (primary) hypertension: Secondary | ICD-10-CM | POA: Diagnosis not present

## 2024-09-02 DIAGNOSIS — Z8546 Personal history of malignant neoplasm of prostate: Secondary | ICD-10-CM | POA: Diagnosis not present

## 2024-09-02 DIAGNOSIS — M810 Age-related osteoporosis without current pathological fracture: Secondary | ICD-10-CM | POA: Diagnosis not present

## 2024-09-02 DIAGNOSIS — I700 Atherosclerosis of aorta: Secondary | ICD-10-CM | POA: Diagnosis not present

## 2024-09-05 MED ORDER — WEGOVY 1 MG/0.5ML ~~LOC~~ SOAJ: 1.0000 mg | SUBCUTANEOUS | 0 refills | Status: DC

## 2024-09-05 MED ORDER — WEGOVY 1 MG/0.5ML ~~LOC~~ SOAJ: 1.0000 mg | SUBCUTANEOUS | 2 refills | Status: DC

## 2024-09-05 NOTE — Addendum Note (Signed)
 Addended by: Justyce Baby K on: 09/05/2024 02:08 PM   Modules accepted: Orders

## 2024-09-05 NOTE — Addendum Note (Signed)
 Addended by: Riddick Nuon K on: 09/05/2024 02:06 PM   Modules accepted: Orders

## 2024-09-11 DIAGNOSIS — I700 Atherosclerosis of aorta: Secondary | ICD-10-CM | POA: Diagnosis not present

## 2024-09-11 DIAGNOSIS — I10 Essential (primary) hypertension: Secondary | ICD-10-CM | POA: Diagnosis not present

## 2024-09-20 DIAGNOSIS — C61 Malignant neoplasm of prostate: Secondary | ICD-10-CM | POA: Diagnosis not present

## 2024-10-02 DIAGNOSIS — I10 Essential (primary) hypertension: Secondary | ICD-10-CM | POA: Diagnosis not present

## 2024-10-02 DIAGNOSIS — M810 Age-related osteoporosis without current pathological fracture: Secondary | ICD-10-CM | POA: Diagnosis not present

## 2024-10-02 DIAGNOSIS — I700 Atherosclerosis of aorta: Secondary | ICD-10-CM | POA: Diagnosis not present

## 2024-10-02 DIAGNOSIS — Z8546 Personal history of malignant neoplasm of prostate: Secondary | ICD-10-CM | POA: Diagnosis not present

## 2024-10-31 ENCOUNTER — Telehealth: Payer: Self-pay | Admitting: Internal Medicine

## 2024-10-31 ENCOUNTER — Other Ambulatory Visit: Payer: Self-pay | Admitting: Internal Medicine

## 2024-10-31 DIAGNOSIS — E6609 Other obesity due to excess calories: Secondary | ICD-10-CM

## 2024-10-31 DIAGNOSIS — I480 Paroxysmal atrial fibrillation: Secondary | ICD-10-CM

## 2024-10-31 MED ORDER — WEGOVY 1 MG/0.5ML ~~LOC~~ SOAJ: 1.0000 mg | SUBCUTANEOUS | 2 refills | Status: DC

## 2024-10-31 NOTE — Telephone Encounter (Signed)
" °*  STAT* If patient is at the pharmacy, call can be transferred to refill team.   1. Which medications need to be refilled? (please list name of each medication and dose if known)  semaglutide -weight management (WEGOVY ) 1 MG/0.5ML SOAJ SQ injection   2. Would you like to learn more about the convenience, safety, & potential cost savings by using the [facility]? no   3. Are you open to using the [facility] (Type [facility]. no   4. Which pharmacy/location (including street and city if local pharmacy) is medication to be sent to? [id number] - [place], [place] - [address]    5. Do they need a 30 day or 90 day supply?   "

## 2024-10-31 NOTE — Telephone Encounter (Signed)
 Prescription refill request for Xarelto  received.  Indication:afib Last office visit:9/25 Weight:105.7  kg Age:78 Scr:0.94  2025 CrCl:99.95  ml/min  Prescription refilled

## 2024-11-24 ENCOUNTER — Telehealth: Payer: Self-pay | Admitting: Pharmacist

## 2024-11-24 MED ORDER — WEGOVY 1.7 MG/0.75ML ~~LOC~~ SOAJ: 1.7000 mg | SUBCUTANEOUS | 1 refills | Status: AC

## 2024-11-24 NOTE — Telephone Encounter (Signed)
 212 lbs now, tolerates Wegovy  1 mg dose well. Ready to increase to 1.7 mg once a week. Prescription sent with 1 refill patient to call in 4-8 weeks for further dose titration.
